# Patient Record
Sex: Female | Born: 1990 | Race: White | Hispanic: No | Marital: Married | Smoking: Never smoker
Health system: Southern US, Community
[De-identification: ages and names within clinical notes are randomized; demographics above are authoritative.]

## PROBLEM LIST (undated history)

## (undated) ENCOUNTER — Inpatient Hospital Stay: Payer: Self-pay

## (undated) DIAGNOSIS — E282 Polycystic ovarian syndrome: Secondary | ICD-10-CM

## (undated) DIAGNOSIS — O24419 Gestational diabetes mellitus in pregnancy, unspecified control: Secondary | ICD-10-CM

## (undated) DIAGNOSIS — Z8041 Family history of malignant neoplasm of ovary: Secondary | ICD-10-CM

## (undated) DIAGNOSIS — G43909 Migraine, unspecified, not intractable, without status migrainosus: Secondary | ICD-10-CM

## (undated) DIAGNOSIS — M25511 Pain in right shoulder: Secondary | ICD-10-CM

## (undated) DIAGNOSIS — O159 Eclampsia, unspecified as to time period: Secondary | ICD-10-CM

## (undated) DIAGNOSIS — M549 Dorsalgia, unspecified: Secondary | ICD-10-CM

## (undated) DIAGNOSIS — F419 Anxiety disorder, unspecified: Secondary | ICD-10-CM

## (undated) DIAGNOSIS — M25512 Pain in left shoulder: Secondary | ICD-10-CM

## (undated) DIAGNOSIS — Z1371 Encounter for nonprocreative screening for genetic disease carrier status: Secondary | ICD-10-CM

## (undated) HISTORY — DX: Family history of malignant neoplasm of ovary: Z80.41

## (undated) HISTORY — DX: Dorsalgia, unspecified: M54.9

## (undated) HISTORY — PX: WISDOM TOOTH EXTRACTION: SHX21

## (undated) HISTORY — DX: Pain in right shoulder: M25.511

## (undated) HISTORY — DX: Pain in left shoulder: M25.512

## (undated) HISTORY — DX: Encounter for nonprocreative screening for genetic disease carrier status: Z13.71

---

## 2010-02-15 ENCOUNTER — Ambulatory Visit
Admission: RE | Admit: 2010-02-15 | Discharge: 2010-02-15 | Payer: Self-pay | Source: Home / Self Care | Attending: Obstetrics & Gynecology | Admitting: Obstetrics & Gynecology

## 2010-06-26 NOTE — Assessment & Plan Note (Signed)
Denise Walker, Denise Walker                 ACCOUNT NO.:  [id number]   MEDICAL RECORD NO.:  [id number]          PATIENT TYPE:  POB   LOCATION:  [facility] at [facility]         FACILITY:  [facility]   PHYSICIAN:  Scheryl Darter, MD       DATE OF BIRTH:  Jun 24, 1990   DATE OF SERVICE:                                  CLINIC NOTE   HISTORY OF PRESENT ILLNESS:  She comes in today due to lower abdominal  pain.  The patient is a 20 year old white female gravida 1, para 0,  abortus 1, last menstrual period was 2-1/2 to 3 months ago when she  stopped taking oral contraceptive pills.  The patient has been seen for  several ER visits due to lower abdominal pain that started about a week  before Christmas.  Right side hurts more than the left, but she also has  pelvic pressure.  The patient began suprapubic pain.  She notes that she  has to urinate frequently and she has urge.  She was seen in [place] at  [facility] on two occasions.  CT scan  showed a small kidney stone and a 24-mm cyst.  This is according to the  patient's report we does not have a copy of that report.  She had  ultrasound last Thursday at the emergency room at [facility] and gallbladder  was normal.  She says that she saw a urologist who told her that the  kidney stone was too small to be causing a problem and that the most  likely reason for her pain was the ovarian cyst.  The patient states  that she has very irregular periods if she is not on oral contraceptive  pills.  She has trouble remembering to take her pills.   PAST MEDICAL HISTORY:  The patient started menstruating at age 59.  She  states that on testing, her liver enzymes were elevated.   SOCIAL HISTORY:  She does drink alcohol but she denies tobacco or drug  use.  She is single.   PAST SURGICAL HISTORY:  Wisdom teeth were removed in September 2011.   FAMILY HISTORY:  Diabetes and cancer in her mother and grandfather.  She  says that mother had some sort of  gynecologic cancer.   ALLERGIES:  SEAFOOD.   CURRENT MEDICATIONS:  Vicodin which she says makes her nauseated.   REVIEW OF SYSTEMS:  She is still having the pain is described.  No  vaginal bleeding.  No hematuria noted except for when she was seen in  the emergency room 3 weeks ago.   PHYSICAL EXAMINATION:  GENERAL:  A mildly anxious weight is 140 pounds,  height is 4 feet 11 inches, blood pressure 93/64, pulse 80.  ABDOMEN:  Soft and nontender.  No mass.  PELVIC:  External genitalia:  Vagina and cervix appeared normal.  She  has some mild suprapubic tenderness.  No cervical motion tenderness.  Firm stool is palpated posterior of the vagina.  No adnexal masses or  tenderness.   IMPRESSION:  Pelvic pain with some urinary symptoms and CT scan that  showed ovarian cyst and kidney stones.   PLAN:  Gave release of records from her ER visits and her urology visit.  Schedule a followup ultrasound of pelvis.  We discussed the possibility  getting back on oral contraceptives or some other kind of hormonal  management of her cycles.  She will consider doing this.  We will see  her back after the ultrasound has been completed.      Scheryl Darter, MD     JA/MEDQ  D:  02/15/2010  T:  02/15/2010  Job:  [id number]

## 2010-08-21 ENCOUNTER — Emergency Department: Payer: Self-pay | Admitting: Emergency Medicine

## 2011-04-14 ENCOUNTER — Emergency Department: Payer: Self-pay | Admitting: *Deleted

## 2012-05-07 ENCOUNTER — Ambulatory Visit: Payer: Self-pay | Admitting: Obstetrics and Gynecology

## 2012-11-06 ENCOUNTER — Emergency Department: Payer: Self-pay | Admitting: Emergency Medicine

## 2012-11-06 LAB — CBC
HGB: 13.6 g/dL (ref 12.0–16.0)
MCH: 30.5 pg (ref 26.0–34.0)
RBC: 4.45 10*6/uL (ref 3.80–5.20)
WBC: 6.8 10*3/uL (ref 3.6–11.0)

## 2012-11-06 LAB — COMPREHENSIVE METABOLIC PANEL
Albumin: 3.9 g/dL (ref 3.4–5.0)
BUN: 9 mg/dL (ref 7–18)
Bilirubin,Total: 0.3 mg/dL (ref 0.2–1.0)
Calcium, Total: 8.8 mg/dL (ref 8.5–10.1)
Co2: 25 mmol/L (ref 21–32)
Creatinine: 0.96 mg/dL (ref 0.60–1.30)
Glucose: 99 mg/dL (ref 65–99)
Osmolality: 282 (ref 275–301)
Potassium: 3.4 mmol/L — ABNORMAL LOW (ref 3.5–5.1)
SGPT (ALT): 28 U/L (ref 12–78)

## 2012-11-06 LAB — LIPASE, BLOOD: Lipase: 144 U/L (ref 73–393)

## 2012-11-06 LAB — HCG, QUANTITATIVE, PREGNANCY: Beta Hcg, Quant.: <1 m[IU]/mL — ABNORMAL LOW

## 2012-12-27 ENCOUNTER — Emergency Department: Payer: Self-pay | Admitting: Emergency Medicine

## 2012-12-28 LAB — CBC
HGB: 12.9 g/dL (ref 12.0–16.0)
MCH: 30.2 pg (ref 26.0–34.0)
MCHC: 33.6 g/dL (ref 32.0–36.0)
MCV: 90 fL (ref 80–100)
Platelet: 287 10*3/uL (ref 150–440)
RDW: 12.7 % (ref 11.5–14.5)
WBC: 10.8 10*3/uL (ref 3.6–11.0)

## 2012-12-28 LAB — COMPREHENSIVE METABOLIC PANEL
Albumin: 3.8 g/dL (ref 3.4–5.0)
Anion Gap: 5 — ABNORMAL LOW (ref 7–16)
BUN: 15 mg/dL (ref 7–18)
Chloride: 106 mmol/L (ref 98–107)
Co2: 27 mmol/L (ref 21–32)
Creatinine: 0.93 mg/dL (ref 0.60–1.30)
Glucose: 124 mg/dL — ABNORMAL HIGH (ref 65–99)
Osmolality: 278 (ref 275–301)
SGPT (ALT): 35 U/L (ref 12–78)

## 2012-12-28 LAB — TROPONIN I: Troponin-I: <0.02 ng/mL

## 2013-06-14 ENCOUNTER — Other Ambulatory Visit: Payer: Self-pay

## 2013-06-14 LAB — HCG, QUANTITATIVE, PREGNANCY: Beta Hcg, Quant.: 23 m[IU]/mL — ABNORMAL HIGH

## 2013-10-11 ENCOUNTER — Emergency Department: Payer: Self-pay | Admitting: Student

## 2013-10-11 LAB — CBC
HCT: 43.1 % (ref 35.0–47.0)
HGB: 13.7 g/dL (ref 12.0–16.0)
MCH: 28.7 pg (ref 26.0–34.0)
MCHC: 31.9 g/dL — ABNORMAL LOW (ref 32.0–36.0)
MCV: 90 fL (ref 80–100)
Platelet: 256 10*3/uL (ref 150–440)
RBC: 4.79 10*6/uL (ref 3.80–5.20)
RDW: 13.3 % (ref 11.5–14.5)
WBC: 7.5 10*3/uL (ref 3.6–11.0)

## 2013-10-11 LAB — URINALYSIS, COMPLETE
BILIRUBIN, UR: NEGATIVE
Bacteria: NONE SEEN
Blood: NEGATIVE
GLUCOSE, UR: NEGATIVE mg/dL (ref 0–75)
Ketone: NEGATIVE
Leukocyte Esterase: NEGATIVE
Nitrite: NEGATIVE
PH: 6 (ref 4.5–8.0)
PROTEIN: NEGATIVE
Specific Gravity: 1.008 (ref 1.003–1.030)

## 2013-10-11 LAB — COMPREHENSIVE METABOLIC PANEL
ALT: 24 U/L
ANION GAP: 8 (ref 7–16)
AST: 15 U/L (ref 15–37)
Albumin: 4.1 g/dL (ref 3.4–5.0)
Alkaline Phosphatase: 80 U/L
BUN: 7 mg/dL (ref 7–18)
Bilirubin,Total: 0.2 mg/dL (ref 0.2–1.0)
CALCIUM: 9 mg/dL (ref 8.5–10.1)
Chloride: 105 mmol/L (ref 98–107)
Co2: 27 mmol/L (ref 21–32)
Creatinine: 0.83 mg/dL (ref 0.60–1.30)
EGFR (African American): >60
EGFR (Non-African Amer.): >60
Glucose: 101 mg/dL — ABNORMAL HIGH (ref 65–99)
Osmolality: 278 (ref 275–301)
Potassium: 3.7 mmol/L (ref 3.5–5.1)
SODIUM: 140 mmol/L (ref 136–145)
Total Protein: 8 g/dL (ref 6.4–8.2)

## 2013-10-12 HISTORY — PX: HERNIA REPAIR: SHX51

## 2013-10-25 ENCOUNTER — Ambulatory Visit: Payer: Self-pay | Admitting: Surgery

## 2013-11-03 ENCOUNTER — Other Ambulatory Visit: Payer: Self-pay | Admitting: Surgery

## 2013-11-03 LAB — URINALYSIS, COMPLETE
BLOOD: NEGATIVE
Bilirubin,UR: NEGATIVE
Glucose,UR: NEGATIVE mg/dL (ref 0–75)
KETONE: NEGATIVE
NITRITE: NEGATIVE
Ph: 6 (ref 4.5–8.0)
Protein: NEGATIVE
RBC,UR: 3 /HPF (ref 0–5)
SPECIFIC GRAVITY: 1.019 (ref 1.003–1.030)
Squamous Epithelial: 6

## 2014-04-21 LAB — OB RESULTS CONSOLE VARICELLA ZOSTER ANTIBODY, IGG: Varicella: IMMUNE

## 2014-04-21 LAB — OB RESULTS CONSOLE RUBELLA ANTIBODY, IGM: Rubella: NON-IMMUNE/NOT IMMUNE

## 2014-04-21 LAB — OB RESULTS CONSOLE HEPATITIS B SURFACE ANTIGEN: HEP B S AG: NEGATIVE

## 2014-04-21 LAB — OB RESULTS CONSOLE RPR: RPR: NONREACTIVE

## 2014-04-21 LAB — OB RESULTS CONSOLE HIV ANTIBODY (ROUTINE TESTING): HIV: NONREACTIVE

## 2014-04-21 LAB — OB RESULTS CONSOLE GC/CHLAMYDIA
CHLAMYDIA, DNA PROBE: NEGATIVE
Gonorrhea: NEGATIVE

## 2014-04-21 LAB — OB RESULTS CONSOLE ABO/RH: RH TYPE: NEGATIVE

## 2014-04-21 LAB — OB RESULTS CONSOLE ANTIBODY SCREEN: Antibody Screen: NEGATIVE

## 2014-05-10 ENCOUNTER — Ambulatory Visit
Admit: 2014-05-10 | Disposition: A | Discharge status: Home / Self Care | Payer: Self-pay | Attending: Obstetrics and Gynecology | Admitting: Obstetrics and Gynecology

## 2014-05-13 ENCOUNTER — Ambulatory Visit
Admit: 2014-05-13 | Disposition: A | Discharge status: Home / Self Care | Payer: Self-pay | Attending: Obstetrics and Gynecology | Admitting: Obstetrics and Gynecology

## 2014-06-04 NOTE — Op Note (Signed)
PATIENT NAME:  Denise Walker, Denise Walker MR#:  [id number] DATE OF BIRTH:  1990/07/05  DATE OF PROCEDURE:  10/25/2013  PREOPERATIVE DIAGNOSIS: Umbilical hernia.   POSTOPERATIVE DIAGNOSIS: Umbilical hernia.   PROCEDURE PERFORMED: Umbilical hernia repair primary.   ANESTHESIA: General.   ESTIMATED BLOOD LOSS: 5 mL.   COMPLICATIONS: None.   SPECIMENS: None.   INDICATION FOR SURGERY: [name] is a pleasant 24 year old female who presents with an umbilical nodule which is causing her pain. It did feel like a fascial defect in the office and therefore she was brought to the Operating Room for umbilical hernia repair.   DETAILS OF PROCEDURE: Informed consent was obtained. [name] was brought to the Operating Room suite. She was induced. Endotracheal tube was placed, general anesthesia was administered. Her abdomen was prepped and draped in a standard surgical fashion. A timeout was then performed correctly identifying the patient, name, operative site, and procedure to be performed. An infraumbilical incision was made and deepened down to fascia. The fascia was incised. The umbilical stalk was encircled with a Kelly clamp. The umbilical stalk was incised. The fascia was then clear. There was a small approximately 5 mm defect in the fascia. This was reduced. The fascial hole was closed with interrupted 0 Ethibond. The umbilicus was then tacked to the repair with an inverted 3-0 Vicryl. The wound was then irrigated. The incision was then closed with interrupted 3-0 Vicryl and a running 4-0 Monocryl subcuticular. Dermabond was then placed over the wound. The patient was then awoken, extubated and brought to the postanesthesia care unit. There were no immediate complications. Needle, sponge, and instrument counts were correct at the end of the procedure.      ____________________________ Si Raiderhristopher A. Ariann Khaimov, MD cal:at D: 10/27/2013 09:04:27 ET T: 10/27/2013 09:19:36 ET JOB#: [id number]  cc: Cristal Deerhristopher  A. Abanoub Hanken, MD, <Dictator> Jarvis NewcomerHRISTOPHER A Shyne Lehrke MD ELECTRONICALLY SIGNED 10/28/2013 10:34

## 2014-06-12 ENCOUNTER — Observation Stay
Admission: EM | Admit: 2014-06-12 | Discharge: 2014-06-12 | Disposition: A | Discharge status: Home / Self Care | Payer: 59 | Attending: Obstetrics & Gynecology | Admitting: Obstetrics & Gynecology

## 2014-06-12 DIAGNOSIS — Z3A35 35 weeks gestation of pregnancy: Secondary | ICD-10-CM | POA: Diagnosis not present

## 2014-06-12 DIAGNOSIS — O368130 Decreased fetal movements, third trimester, not applicable or unspecified: Secondary | ICD-10-CM | POA: Diagnosis present

## 2014-06-12 HISTORY — DX: Gestational diabetes mellitus in pregnancy, unspecified control: O24.419

## 2014-06-12 HISTORY — DX: Polycystic ovarian syndrome: E28.2

## 2014-06-12 LAB — URINALYSIS COMPLETE WITH MICROSCOPIC (ARMC ONLY)
Bilirubin Urine: NEGATIVE
GLUCOSE, UA: NEGATIVE mg/dL
HGB URINE DIPSTICK: NEGATIVE
Ketones, ur: NEGATIVE mg/dL
Leukocytes, UA: NEGATIVE
Nitrite: NEGATIVE
PROTEIN: NEGATIVE mg/dL
SPECIFIC GRAVITY, URINE: 1.016 (ref 1.005–1.030)
pH: 7 (ref 5.0–8.0)

## 2014-06-12 MED ORDER — ACETAMINOPHEN 325 MG PO TABS: 650.0000 mg | ORAL_TABLET | ORAL | Status: DC | PRN

## 2014-06-12 NOTE — H&P (Signed)
Obstetric History and Physical  Denise Walker is a 24 y.o. G2P0010 with IUP at 69w2dpresenting for decreased fetal movement and lower pelvic pain. Patient states she has been having  none contractions, none vaginal bleeding, intact membranes, with decreased  fetal movement.  She reports that she had tried fetal kick counts and did not get 10 movements in 2 hours. She also reports lower pelvic pain that "feels like a bladder infection." She denies fever, chills, nausea, or vomiting.   Prenatal Course Source of Care: [facility]  with onset of care at 8 weeks Pregnancy complications or risks: Patient Active Problem List   Diagnosis Date Noted  . Decreased fetal movement 06/12/2014    Prenatal labs and studies: ABO, Rh: O/Negative/-- (03/10 0000) Antibody: Negative (03/10 0000) Rubella: Nonimmune (03/10 0000) RPR: Nonreactive (03/10 0000)  HBsAg: Negative (03/10 0000)  HIV: Non-reactive (03/10 0000)  GBS: unknown  1 hr Glucola  181 Genetic screening normal Anatomy [place]ormal  Prenatal Transfer Tool  Maternal Diabetes: Yes:  Diabetes Type:  Insulin/Medication controlled Genetic Screening: Normal Maternal Ultrasounds/Referrals: Normal Fetal Ultrasounds or other Referrals:  None Maternal Substance Abuse:  No Significant Maternal Medications:  Meds include: Zoloft Significant Maternal Lab Results: None  Past Medical History  Diagnosis Date  . Gestational diabetes   . PCOS (polycystic ovarian syndrome)     Past Surgical History  Procedure Laterality Date  . Hernia repair  10/2013  . Wisdom tooth extraction      OB History  Gravida Para Term Preterm AB SAB TAB Ectopic Multiple Living  2    1 1         # Outcome Date GA Lbr Len/2nd Weight Sex Delivery Anes PTL Lv  2 Current           1 SAB               History   Social History  . Marital Status: Married    Spouse Name: N/A  . Number of Children: N/A  . Years of Education: N/A   Social History Main Topics  . Smoking  status: Never Smoker   . Smokeless tobacco: Never Used  . Alcohol Use: No  . Drug Use: No  . Sexual Activity: Yes   Other Topics Concern  . None   Social History Narrative  . None    Family History  Problem Relation Age of Onset  . Cancer Mother   . Diabetes Paternal Grandfather     Prescriptions prior to admission  Medication Sig Dispense Refill Last Dose  . ACCU-CHEK AVIVA PLUS test strip   0   . ACCU-CHEK SOFTCLIX LANCETS lancets 4 (four) times daily. for testing  0   . Blood Glucose Monitoring Suppl (ACCU-CHEK AVIVA PLUS) W/DEVICE KIT See admin instructions.  0   . glyBURIDE (DIABETA) 2.5 MG tablet      . sertraline (ZOLOFT) 50 MG tablet Take 50 mg by mouth daily.  0     Allergies  Allergen Reactions  . Fish Allergy Shortness Of Breath    Review of Systems: Negative except for what is mentioned in HPI.  Physical Exam: Temp(Src) 98.4 F (36.9 C) (Oral)  Resp 16  Ht 4' 11"  (1.499 m)  Wt 80.74 kg (178 lb)  BMI 35.93 kg/m2  LMP 10/08/2013 (Exact Date) GENERAL: Well-developed, well-nourished female in no acute distress.  ABDOMEN: Soft, nontender, nondistended, gravid. BACK: negative CVAT EXTREMITIES: Nontender, no edema, 2+ distal pulses. Cervical Exam: Deferred  Presentation: deferred  FHT:  Baseline rate 130 bpm   Variability moderate  Accelerations present   Decelerations none Contractions: Every 0 mins   Pertinent Labs/Studies:   Results for orders placed or performed during the hospital encounter of 06/12/14 (from the past 24 hour(s))  Urinalysis complete, with microscopic [facility])     Status: Abnormal   Collection Time: 06/12/14  3:52 PM  Result Value Ref Range   Color, Urine YELLOW (A) YELLOW   APPearance CLEAR (A) CLEAR   Glucose, UA NEGATIVE NEGATIVE mg/dL   Bilirubin Urine NEGATIVE NEGATIVE   Ketones, ur NEGATIVE NEGATIVE mg/dL   Specific Gravity, Urine 1.016 1.005 - 1.030   Hgb urine dipstick NEGATIVE NEGATIVE   pH 7.0 5.0 - 8.0   Protein, ur  NEGATIVE NEGATIVE mg/dL   Nitrite NEGATIVE NEGATIVE   Leukocytes, UA NEGATIVE NEGATIVE   RBC / HPF 0-5 0 - 5 RBC/hpf   WBC, UA 0-5 0 - 5 WBC/hpf   Bacteria, UA RARE (A) NONE SEEN   Squamous Epithelial / LPF 0-5 (A) NONE SEEN   Mucous PRESENT     Assessment : Denise Walker is a 24 y.o. G2P0010 at 36w2dhere for decreased fetal movement and pelvic pain  Cat 1 FHT  Plan:  UA negative- urine culture to be obtained.  Fetal status reassuring- discussed with pt- readdressed FKCs.  Discharge home. Pt has an appt on Thursday.     CLouisa Second [facility]

## 2014-06-12 NOTE — OB Triage Note (Signed)
24 yo caucasian female with complaint of decreased fetal movement today.  States that she has not noticed the baby moving as much over the past couple of hours.  Denies abdominal pain, bleeding, rupture of membranes.  Denies headaches, heartburn, or blurred vision today.  States that she just wanted to make sure that the baby was doing okay and wanted to be assessed.  Denies any pain.

## 2014-06-22 LAB — OB RESULTS CONSOLE GBS: STREP GROUP B AG: NEGATIVE

## 2014-07-04 ENCOUNTER — Observation Stay
Admission: EM | Admit: 2014-07-04 | Discharge: 2014-07-04 | Disposition: A | Discharge status: Home / Self Care | Payer: 59 | Attending: Obstetrics and Gynecology | Admitting: Obstetrics and Gynecology

## 2014-07-04 DIAGNOSIS — Z3493 Encounter for supervision of normal pregnancy, unspecified, third trimester: Secondary | ICD-10-CM | POA: Diagnosis not present

## 2014-07-04 DIAGNOSIS — Z3A38 38 weeks gestation of pregnancy: Secondary | ICD-10-CM | POA: Insufficient documentation

## 2014-07-04 NOTE — OB Triage Note (Signed)
Obstetric History and Physical  Denise Walker is a 24 y.o. G2P0010 with Estimated Date of Delivery: 07/15/14 who presents at 107w3d presenting for NST after non-reactive NST in office earlier today. Patient states she has been having no contractions, no vaginal bleeding, intact membranes, with active fetal movement.    Prenatal Course Source of Care: [facility] Pregnancy complications or risks: AO1YYQ- on Glyburide Patient Active Problem List   Diagnosis Date Noted  . Non-reactive NST (non-stress test) 07/04/2014  . Decreased fetal movement 06/12/2014    Prenatal labs and studies: ABO, Rh: O neg  Antibody: neg Rubella: non-immune Varicella: immune RPR:  NR HBsAg:  neg HIV: neg GC/CT:  GBS: neg 1 hr Glucola: 3 elevated values on 3 hour glucose   Genetic screening: 1st trimester screen negative    Prenatal Transfer Tool   Past Medical History  Diagnosis Date  . Gestational diabetes   . PCOS (polycystic ovarian syndrome)     Past Surgical History  Procedure Laterality Date  . Hernia repair  10/2013  . Wisdom tooth extraction      OB History  Gravida Para Term Preterm AB SAB TAB Ectopic Multiple Living  _0 # Outcome Date GA Lbr Len/2nd Weight Sex Delivery Anes PTL Lv  2 Current           1 SAB               History   Social History  . Marital Status: Married    Spouse Name: N/A  . Number of Children: N/A  . Years of Education: N/A   Social History Main Topics  . Smoking status: Never Smoker   . Smokeless tobacco: Never Used  . Alcohol Use: No  . Drug Use: No  . Sexual Activity: Yes   Other Topics Concern  . Not on file   Social History Narrative  . No narrative on file    Family History  Problem Relation Age of Onset  . Cancer Mother   . Diabetes Paternal Grandfather     Prescriptions prior to admission  Medication Sig Dispense Refill Last Dose  . ACCU-CHEK AVIVA PLUS test strip   0   . ACCU-CHEK SOFTCLIX LANCETS lancets 4 (four)  times daily. for testing  0   . Blood Glucose Monitoring Suppl (ACCU-CHEK AVIVA PLUS) W/DEVICE KIT See admin instructions.  0   . glyBURIDE (DIABETA) 2.5 MG tablet      . sertraline (ZOLOFT) 50 MG tablet Take 50 mg by mouth daily.  0     Allergies  Allergen Reactions  . Fish Allergy Shortness Of Breath    Review of Systems: Negative except for what is mentioned in HPI.  Physical Exam: LMP 10/08/2013 (Exact Date) GENERAL: Well-developed, well-nourished female in no acute distress.  LUNGS: Clear to auscultation bilaterally.  HEART: Regular rate and rhythm. ABDOMEN: Soft, nontender, nondistended, gravid. EXTREMITIES: Nontender, no edema Cervical Exam: deferred Presentation: cephalic FHT: Category: Baseline rate 135 bpm   Variability moderate  Accelerations present   Decelerations none Contractions: rare   Pertinent Labs/Studies:   No results found for this or any previous visit (from the past 24 hour(s)).  Assessment : IUP at 336w3dith reactive NST  Plan: Discharge Home

## 2014-07-14 ENCOUNTER — Inpatient Hospital Stay
Admission: EM | Admit: 2014-07-14 | Discharge: 2014-07-18 | DRG: 765 | Disposition: A | Discharge status: Home / Self Care | Payer: 59 | Attending: Obstetrics & Gynecology | Admitting: Obstetrics & Gynecology

## 2014-07-14 ENCOUNTER — Encounter: Payer: Self-pay | Admitting: *Deleted

## 2014-07-14 DIAGNOSIS — Z833 Family history of diabetes mellitus: Secondary | ICD-10-CM | POA: Diagnosis not present

## 2014-07-14 DIAGNOSIS — O360930 Maternal care for other rhesus isoimmunization, third trimester, not applicable or unspecified: Secondary | ICD-10-CM | POA: Diagnosis present

## 2014-07-14 DIAGNOSIS — D62 Acute posthemorrhagic anemia: Secondary | ICD-10-CM | POA: Diagnosis not present

## 2014-07-14 DIAGNOSIS — O152 Eclampsia in the puerperium: Secondary | ICD-10-CM | POA: Diagnosis not present

## 2014-07-14 DIAGNOSIS — Z79899 Other long term (current) drug therapy: Secondary | ICD-10-CM

## 2014-07-14 DIAGNOSIS — O24419 Gestational diabetes mellitus in pregnancy, unspecified control: Secondary | ICD-10-CM | POA: Diagnosis present

## 2014-07-14 DIAGNOSIS — O339 Maternal care for disproportion, unspecified: Secondary | ICD-10-CM | POA: Diagnosis present

## 2014-07-14 DIAGNOSIS — O151 Eclampsia in labor: Secondary | ICD-10-CM | POA: Diagnosis not present

## 2014-07-14 DIAGNOSIS — E282 Polycystic ovarian syndrome: Secondary | ICD-10-CM | POA: Diagnosis present

## 2014-07-14 DIAGNOSIS — O9902 Anemia complicating childbirth: Secondary | ICD-10-CM | POA: Diagnosis not present

## 2014-07-14 DIAGNOSIS — O1493 Unspecified pre-eclampsia, third trimester: Secondary | ICD-10-CM | POA: Diagnosis present

## 2014-07-14 DIAGNOSIS — Z3A40 40 weeks gestation of pregnancy: Secondary | ICD-10-CM | POA: Diagnosis present

## 2014-07-14 DIAGNOSIS — O24429 Gestational diabetes mellitus in childbirth, unspecified control: Secondary | ICD-10-CM | POA: Diagnosis present

## 2014-07-14 DIAGNOSIS — O3421 Maternal care for scar from previous cesarean delivery: Secondary | ICD-10-CM | POA: Diagnosis present

## 2014-07-14 LAB — CBC
HEMATOCRIT: 35.9 % (ref 35.0–47.0)
HEMOGLOBIN: 11.7 g/dL — AB (ref 12.0–16.0)
MCH: 28.4 pg (ref 26.0–34.0)
MCHC: 32.5 g/dL (ref 32.0–36.0)
MCV: 87.3 fL (ref 80.0–100.0)
PLATELETS: 141 10*3/uL — AB (ref 150–440)
RBC: 4.12 MIL/uL (ref 3.80–5.20)
RDW: 15.4 % — ABNORMAL HIGH (ref 11.5–14.5)
WBC: 9.5 10*3/uL (ref 3.6–11.0)

## 2014-07-14 LAB — COMPREHENSIVE METABOLIC PANEL
ALT: 33 U/L (ref 14–54)
ANION GAP: 5 (ref 5–15)
AST: 35 U/L (ref 15–41)
Albumin: 2.6 g/dL — ABNORMAL LOW (ref 3.5–5.0)
Alkaline Phosphatase: 179 U/L — ABNORMAL HIGH (ref 38–126)
BUN: 11 mg/dL (ref 6–20)
CO2: 23 mmol/L (ref 22–32)
Calcium: 8.8 mg/dL — ABNORMAL LOW (ref 8.9–10.3)
Chloride: 109 mmol/L (ref 101–111)
Creatinine, Ser: 0.81 mg/dL (ref 0.44–1.00)
GFR calc Af Amer: >60 mL/min (ref >60)
Glucose, Bld: 98 mg/dL (ref 65–99)
Potassium: 3.8 mmol/L (ref 3.5–5.1)
SODIUM: 137 mmol/L (ref 135–145)
Total Bilirubin: 0.4 mg/dL (ref 0.3–1.2)
Total Protein: 5.6 g/dL — ABNORMAL LOW (ref 6.5–8.1)

## 2014-07-14 LAB — GLUCOSE, CAPILLARY
GLUCOSE-CAPILLARY: 83 mg/dL (ref 65–99)
Glucose-Capillary: 91 mg/dL (ref 65–99)
Glucose-Capillary: 102 mg/dL — ABNORMAL HIGH (ref 65–99)

## 2014-07-14 LAB — PROTEIN / CREATININE RATIO, URINE
CREATININE, URINE: 260 mg/dL
PROTEIN CREATININE RATIO: 4.25 mg/mg{creat} — AB (ref 0.00–0.15)
TOTAL PROTEIN, URINE: 1105 mg/dL

## 2014-07-14 LAB — ABO/RH: ABO/RH(D): O NEG

## 2014-07-14 MED ORDER — TRIAZOLAM 0.25 MG PO TABS
ORAL_TABLET | ORAL | Status: AC
  Administered 2014-07-14: 0.25 mg via ORAL
  Filled 2014-07-14: qty 1

## 2014-07-14 MED ORDER — CITRIC ACID-SODIUM CITRATE 334-500 MG/5ML PO SOLN: 30.0000 mL | ORAL | Status: DC | PRN

## 2014-07-14 MED ORDER — BUTORPHANOL TARTRATE 1 MG/ML IJ SOLN
1.0000 mg | INTRAMUSCULAR | Status: DC | PRN
  Administered 2014-07-15 (×2): 1 mg via INTRAVENOUS

## 2014-07-14 MED ORDER — ZOLPIDEM TARTRATE 5 MG PO TABS
ORAL_TABLET | ORAL | Status: AC
  Administered 2014-07-14: 5 mg via ORAL
  Filled 2014-07-14: qty 1

## 2014-07-14 MED ORDER — LACTATED RINGERS IV SOLN
500.0000 mL | INTRAVENOUS | Status: DC | PRN
  Administered 2014-07-15: 500 mL via INTRAVENOUS

## 2014-07-14 MED ORDER — TRIAZOLAM 0.25 MG PO TABS
0.2500 mg | ORAL_TABLET | Freq: Once | ORAL | Status: AC
  Administered 2014-07-14: 0.25 mg via ORAL

## 2014-07-14 MED ORDER — TERBUTALINE SULFATE 1 MG/ML IJ SOLN: 0.2500 mg | Freq: Once | INTRAMUSCULAR | Status: AC | PRN

## 2014-07-14 MED ORDER — DINOPROSTONE 10 MG VA INST
10.0000 mg | VAGINAL_INSERT | Freq: Once | VAGINAL | Status: AC
  Administered 2014-07-14: 10 mg via VAGINAL
  Filled 2014-07-14 (×2): qty 1

## 2014-07-14 MED ORDER — LACTATED RINGERS IV SOLN
INTRAVENOUS | Status: DC
  Administered 2014-07-14 (×2): via INTRAVENOUS
  Administered 2014-07-16: 75 mL/h via INTRAVENOUS

## 2014-07-14 MED ORDER — OXYTOCIN BOLUS FROM INFUSION: 500.0000 mL | INTRAVENOUS | Status: DC

## 2014-07-14 MED ORDER — ACETAMINOPHEN 325 MG PO TABS
650.0000 mg | ORAL_TABLET | ORAL | Status: DC | PRN
  Administered 2014-07-15 (×2): 650 mg via ORAL

## 2014-07-14 MED ORDER — LABETALOL HCL 200 MG PO TABS: 200.0000 mg | ORAL_TABLET | Freq: Two times a day (BID) | ORAL | Status: DC

## 2014-07-14 MED ORDER — MISOPROSTOL 25 MCG QUARTER TABLET
25.0000 ug | ORAL_TABLET | ORAL | Status: DC | PRN
  Administered 2014-07-15 (×2): 25 ug via VAGINAL

## 2014-07-14 MED ORDER — ONDANSETRON HCL 4 MG/2ML IJ SOLN: 4.0000 mg | Freq: Four times a day (QID) | INTRAMUSCULAR | Status: DC | PRN

## 2014-07-14 MED ORDER — OXYTOCIN 40 UNITS IN LACTATED RINGERS INFUSION - SIMPLE MED
62.5000 mL/h | INTRAVENOUS | Status: DC
  Administered 2014-07-15: 1 mL via INTRAVENOUS
  Administered 2014-07-15: 399 mL via INTRAVENOUS

## 2014-07-14 MED ORDER — MISOPROSTOL 25 MCG QUARTER TABLET
ORAL_TABLET | ORAL | Status: AC
  Administered 2014-07-15: 25 ug via VAGINAL
  Filled 2014-07-14: qty 0.25

## 2014-07-14 MED ORDER — ZOLPIDEM TARTRATE 5 MG PO TABS
5.0000 mg | ORAL_TABLET | Freq: Every evening | ORAL | Status: DC | PRN
  Administered 2014-07-14: 5 mg via ORAL

## 2014-07-14 MED ORDER — LIDOCAINE HCL (PF) 1 % IJ SOLN: 30.0000 mL | INTRAMUSCULAR | Status: DC | PRN

## 2014-07-14 NOTE — H&P (Signed)
   Obstetric H&P   Chief Complaint: IOL [place] Prenatal Care Provider: [facility]  History of Present Illness: 24 y.o. G2P0010 14w6dby 07/15/2014, presenting to L&D for IOL for [place] glyburide well controlled.   ABO, Rh: O/Negative/-- (03/10 0000)  Antibody: Negative (03/10 0000)   RPR: Nonreactive (03/10 0000)  HBsAg: Negative (03/10 0000)  HIV: Non-reactive (03/10 0000)  RPR: Nonreactive (03/10 0000) GBS: negative  Review of Systems: 10 point review of systems negative unless otherwise noted in HPI  Past Medical History: Past Medical History  Diagnosis Date  . Gestational diabetes   . PCOS (polycystic ovarian syndrome)     Past Surgical History: Past Surgical History  Procedure Laterality Date  . Hernia repair  10/2013  . Wisdom tooth extraction      Family History: Family History  Problem Relation Age of Onset  . Cancer Mother   . Diabetes Paternal Grandfather     Social History: History   Social History  . Marital Status: Married    Spouse Name: N/A  . Number of Children: N/A  . Years of Education: N/A   Occupational History  . Not on file.   Social History Main Topics  . Smoking status: Never Smoker   . Smokeless tobacco: Never Used  . Alcohol Use: No  . Drug Use: No  . Sexual Activity: Yes   Other Topics Concern  . Not on file   Social History Narrative    Medications: Prior to Admission medications   Medication Sig Start Date End Date Taking? Authorizing Provider  ACCU-CHEK AVIVA PLUS test strip  05/19/14  Yes Historical Provider, MD  ACCU-CHEK SOFTCLIX LANCETS lancets 4 (four) times daily. for testing 04/29/14  Yes Historical Provider, MD  Blood Glucose Monitoring Suppl (ACCU-CHEK AVIVA PLUS) W/DEVICE KIT See admin instructions. 04/29/14  Yes Historical Provider, MD  diphenhydrAMINE (BENADRYL) 25 mg capsule Take 25 mg by mouth every 6 (six) hours as needed.   Yes Historical Provider, MD  glyBURIDE (DIABETA) 2.5 MG tablet  05/20/14  Yes Historical  Provider, MD  Prenatal MV-Min-Fe Fum-FA-DHA (PRENATAL 1 PO) Take 1 tablet by mouth daily.   Yes Historical Provider, MD  sertraline (ZOLOFT) 50 MG tablet Take 50 mg by mouth daily. 05/27/14  Yes Historical Provider, MD    Allergies: Allergies  Allergen Reactions  . Fish Allergy Shortness Of Breath    Physical Exam: Vitals: Blood pressure 155/91, pulse 71, temperature 97.7 F (36.5 C), temperature source Oral, resp. rate 20, height 4' 11" (1.499 m), weight 81.194 kg (179 lb), last menstrual period 10/08/2013.  Urine Dip Protein: pending  FHT: 130, mod, +accels, no decels Toco: none  General: NAD HEENT: normocephalic, anicteric Pulmonary: CTAB Cardiovascular: RRR Abdomen: Gravid,  Soft, non-tender Leopolds: 7lbs Extremities: no edema  Labs: No results found for this or any previous visit (from the past 24 hour(s)).  Assessment: 24y.o. G2P0010 319w6dy 07/15/2014, IOL GDM  Plan: 1) IOL - cervidil  2) Fetus -reactive  3) PNL - O/Negative/-- (03/10 0000) / Negative (03/10 0000) / Nonreactive (03/10 0000) / Negative (03/10 0000) / Nonreactive (03/10 0000)  / Non-reactive (03/10 0000)   4) TDAP - up to date   5) Elevated BP - send PIH panel  6) Disposition - pending delivery

## 2014-07-14 NOTE — Progress Notes (Signed)
Subjective:  Doing well no HA, vision changes, RUQ or epigastric pain Objective:   Vitals: Blood pressure labile up and down, resp. rate 16, height  (1.499 m), weight 81.194 kg (179 lb), last menstrual period 10/08/2013. General: NAD Abdomen: ND, NT, gravid, VTX Cervical Exam: difficult exam due to pt intolerence to exam Dilation: Fingertip Effacement (%): unsure Cervical Position: Posterior Station: unsure Presentation: Vertex  FHT: 140s, mod, + acels, no decels Toco: irregular  Results for orders placed or performed during the hospital encounter of 07/14/14 (from the past 24 hour(s))  CBC     Status: Abnormal   Collection Time: 07/14/14  7:58 AM  Result Value Ref Range   WBC 9.5 3.6 - 11.0 K/uL   RBC 4.12 3.80 - 5.20 MIL/uL   Hemoglobin 11.7 (L) 12.0 - 16.0 g/dL   HCT 16.1 09.6 - 04.5 %   MCV 87.3 80.0 - 100.0 fL   MCH 28.4 26.0 - 34.0 pg   MCHC 32.5 32.0 - 36.0 g/dL   RDW 40.9 (H) 81.1 - 91.4 %   Platelets 141 (L) 150 - 440 K/uL  Comprehensive metabolic panel     Status: Abnormal   Collection Time: 07/14/14  7:58 AM  Result Value Ref Range   Sodium 137 135 - 145 mmol/L   Potassium 3.8 3.5 - 5.1 mmol/L   Chloride 109 101 - 111 mmol/L   CO2 23 22 - 32 mmol/L   Glucose, Bld 98 65 - 99 mg/dL   BUN 11 6 - 20 mg/dL   Creatinine, Ser 7.82 0.44 - 1.00 mg/dL   Calcium 8.8 (L) 8.9 - 10.3 mg/dL   Total Protein 5.6 (L) 6.5 - 8.1 g/dL   Albumin 2.6 (L) 3.5 - 5.0 g/dL   AST 35 15 - 41 U/L   ALT 33 14 - 54 U/L   Alkaline Phosphatase 179 (H) 38 - 126 U/L   Total Bilirubin 0.4 0.3 - 1.2 mg/dL   GFR calc non Af Amer >60 >60 mL/min   GFR calc Af Amer >60 >60 mL/min   Anion gap 5 5 - 15  Protein / creatinine ratio, urine     Status: Abnormal   Collection Time: 07/14/14  7:58 AM  Result Value Ref Range   Creatinine, Urine 260 mg/dL   Total Protein, Urine 1105 mg/dL   Protein Creatinine Ratio 4.25 (H) 0.00 - 0.15 mg/mg[Cre]  Type and screen     Status: None (Preliminary  result)   Collection Time: 07/14/14  8:40 AM  Result Value Ref Range   ABO/RH(D) O NEG    Antibody Screen NEG    Sample Expiration 07/17/2014    Antibody Identification PASSIVELY ACQUIRED ANTI-D   ABO/Rh     Status: None   Collection Time: 07/14/14  8:41 AM  Result Value Ref Range   ABO/RH(D) O NEG   Glucose, capillary     Status: None   Collection Time: 07/14/14 11:47 AM  Result Value Ref Range   Glucose-Capillary 91 65 - 99 mg/dL  Glucose, capillary     Status: Abnormal   Collection Time: 07/14/14  4:22 PM  Result Value Ref Range   Glucose-Capillary 102 (H) 65 - 99 mg/dL    Assessment:   24 y.o. G2P0010 [redacted]w[redacted]d  Preeclampsia without severe features. IOL GDMA2 Plan:   1) Labor - continue cervidl   2) Fetus - cat I tracing  3) Preeclampsia - has had a few severe range BP's followed normotensive BP's.  Will  monitor if consistently severe range consider po labetalol or IV hydralazine and then would also start magnesium sulfate  3) Diet now, finish Cervadil.  Reassess at that time (2200)

## 2014-07-14 NOTE — Progress Notes (Signed)
Subjective:  Doing well no HA, vision changes, RUQ or epigastric pain Objective:   Vitals: Blood pressure labile up and down, resp. rate 16, height 4\' 11"  (1.499 m), weight 81.194 kg (179 lb), last menstrual period 10/08/2013. General: NAD Abdomen: ND, NT, gravid, VTX Cervical Exam: difficult exam due to pt intolerence to exam Dilation: Fingertip Effacement (%): 50%  Cervical Position: Posterior Station: -3 Presentation: Vertex  FHT: 140s, mod, + acels, no decels Toco: irregular    Assessment:   24 y.o. G2P0010 6186w6d  Preeclampsia without severe features. IOL GDMA2 Plan:   1) Labor - change to Cytotec.  2) Fetus - cat I tracing  3) Preeclampsia - has had a few severe range BP's followed normotensive BP's.  Will monitor if consistently severe range consider po labetalol or IV hydralazine and then would also start magnesium sulfate  3) Ambien and Stadol PRN

## 2014-07-14 NOTE — Progress Notes (Signed)
Subjective:  Doing well no HA, vision changes, RUQ or epigastric pain Objective:   Vitals: Blood pressure 177/100, pulse 63, temperature 98.2 F (36.8 C), temperature source Oral, resp. rate 16, height 4\' 11"  (1.499 m), weight 81.194 kg (179 lb), last menstrual period 10/08/2013. General:  Abdomen: Cervical Exam:  Dilation: Fingertip Effacement (%): 80 Cervical Position: Posterior Station: -3 Presentation: Vertex Exam by:: L. Elks, RNC  FHT: 135, mod, + acels, no decels Toco: irregular  Results for orders placed or performed during the hospital encounter of 07/14/14 (from the past 24 hour(s))  CBC     Status: Abnormal   Collection Time: 07/14/14  7:58 AM  Result Value Ref Range   WBC 9.5 3.6 - 11.0 K/uL   RBC 4.12 3.80 - 5.20 MIL/uL   Hemoglobin 11.7 (L) 12.0 - 16.0 g/dL   HCT 16.135.9 09.635.0 - 04.547.0 %   MCV 87.3 80.0 - 100.0 fL   MCH 28.4 26.0 - 34.0 pg   MCHC 32.5 32.0 - 36.0 g/dL   RDW 40.915.4 (H) 81.111.5 - 91.414.5 %   Platelets 141 (L) 150 - 440 K/uL  Comprehensive metabolic panel     Status: Abnormal   Collection Time: 07/14/14  7:58 AM  Result Value Ref Range   Sodium 137 135 - 145 mmol/L   Potassium 3.8 3.5 - 5.1 mmol/L   Chloride 109 101 - 111 mmol/L   CO2 23 22 - 32 mmol/L   Glucose, Bld 98 65 - 99 mg/dL   BUN 11 6 - 20 mg/dL   Creatinine, Ser 7.820.81 0.44 - 1.00 mg/dL   Calcium 8.8 (L) 8.9 - 10.3 mg/dL   Total Protein 5.6 (L) 6.5 - 8.1 g/dL   Albumin 2.6 (L) 3.5 - 5.0 g/dL   AST 35 15 - 41 U/L   ALT 33 14 - 54 U/L   Alkaline Phosphatase 179 (H) 38 - 126 U/L   Total Bilirubin 0.4 0.3 - 1.2 mg/dL   GFR calc non Af Amer >60 >60 mL/min   GFR calc Af Amer >60 >60 mL/min   Anion gap 5 5 - 15  Protein / creatinine ratio, urine     Status: Abnormal   Collection Time: 07/14/14  7:58 AM  Result Value Ref Range   Creatinine, Urine 260 mg/dL   Total Protein, Urine 1105 mg/dL   Protein Creatinine Ratio 4.25 (H) 0.00 - 0.15 mg/mg[Cre]  Type and screen     Status: None (Preliminary  result)   Collection Time: 07/14/14  8:40 AM  Result Value Ref Range   ABO/RH(D) PENDING    Antibody Screen PENDING    Sample Expiration 07/17/2014   ABO/Rh     Status: None   Collection Time: 07/14/14  8:41 AM  Result Value Ref Range   ABO/RH(D) O NEG   Glucose, capillary     Status: None   Collection Time: 07/14/14 11:47 AM  Result Value Ref Range   Glucose-Capillary 91 65 - 99 mg/dL    Assessment:   23 y.o. G2P0010 1545w6d  Preeclampsia without severe features. IOL GDMA2 Plan:   1) Labor - continue cervidl   2) Fetus - cat I tracing  3) Preeclampsia - has had a few severe range BP's followed normotensive BP's.  Will monitor if consistently severe range consider po labetalol or IV hydralazine and then would also start magnesium sulfate

## 2014-07-15 ENCOUNTER — Encounter: Payer: Self-pay | Admitting: Anesthesiology

## 2014-07-15 ENCOUNTER — Encounter
Admission: EM | Disposition: A | Discharge status: Home / Self Care | Payer: Self-pay | Source: Home / Self Care | Attending: Obstetrics & Gynecology

## 2014-07-15 ENCOUNTER — Inpatient Hospital Stay: Payer: 59 | Admitting: Certified Registered"

## 2014-07-15 LAB — COMPREHENSIVE METABOLIC PANEL
ALK PHOS: 167 U/L — AB (ref 38–126)
ALT: 32 U/L (ref 14–54)
ANION GAP: 8 (ref 5–15)
AST: 40 U/L (ref 15–41)
Albumin: 2.3 g/dL — ABNORMAL LOW (ref 3.5–5.0)
BUN: 9 mg/dL (ref 6–20)
CHLORIDE: 110 mmol/L (ref 101–111)
CO2: 24 mmol/L (ref 22–32)
Calcium: 8.2 mg/dL — ABNORMAL LOW (ref 8.9–10.3)
Creatinine, Ser: 1.03 mg/dL — ABNORMAL HIGH (ref 0.44–1.00)
GFR calc non Af Amer: >60 mL/min (ref >60)
GLUCOSE: 112 mg/dL — AB (ref 65–99)
Potassium: 4.4 mmol/L (ref 3.5–5.1)
SODIUM: 142 mmol/L (ref 135–145)
TOTAL PROTEIN: 5.2 g/dL — AB (ref 6.5–8.1)
Total Bilirubin: 0.4 mg/dL (ref 0.3–1.2)

## 2014-07-15 LAB — CBC WITH DIFFERENTIAL/PLATELET
BASOS ABS: 0.3 10*3/uL — AB (ref 0–0.1)
BASOS PCT: 2 %
Eosinophils Absolute: 0.1 10*3/uL (ref 0–0.7)
Eosinophils Relative: 0 %
HEMATOCRIT: 37.3 % (ref 35.0–47.0)
HEMOGLOBIN: 12 g/dL (ref 12.0–16.0)
LYMPHS PCT: 2 %
Lymphs Abs: 0.4 10*3/uL — ABNORMAL LOW (ref 1.0–3.6)
MCH: 28 pg (ref 26.0–34.0)
MCHC: 32.1 g/dL (ref 32.0–36.0)
MCV: 87.4 fL (ref 80.0–100.0)
MONO ABS: 1 10*3/uL — AB (ref 0.2–0.9)
MONOS PCT: 5 %
NEUTROS ABS: 16 10*3/uL — AB (ref 1.4–6.5)
NEUTROS PCT: 91 %
Platelets: 143 10*3/uL — ABNORMAL LOW (ref 150–440)
RBC: 4.27 MIL/uL (ref 3.80–5.20)
RDW: 15.4 % — AB (ref 11.5–14.5)
WBC: 17.7 10*3/uL — AB (ref 3.6–11.0)

## 2014-07-15 LAB — PROTEIN / CREATININE RATIO, URINE
CREATININE, URINE: 171 mg/dL
Protein Creatinine Ratio: 5.34 mg/mg{Cre} — ABNORMAL HIGH (ref 0.00–0.15)
Total Protein, Urine: 913 mg/dL

## 2014-07-15 LAB — PLATELET COUNT: Platelets: 130 10*3/uL — ABNORMAL LOW (ref 150–440)

## 2014-07-15 LAB — GLUCOSE, CAPILLARY
GLUCOSE-CAPILLARY: 86 mg/dL (ref 65–99)
GLUCOSE-CAPILLARY: 104 mg/dL — AB (ref 65–99)
Glucose-Capillary: 100 mg/dL — ABNORMAL HIGH (ref 65–99)
Glucose-Capillary: 105 mg/dL — ABNORMAL HIGH (ref 65–99)
Glucose-Capillary: 94 mg/dL (ref 65–99)

## 2014-07-15 LAB — RPR: RPR Ser Ql: NONREACTIVE

## 2014-07-15 SURGERY — Surgical Case
Anesthesia: Epidural

## 2014-07-15 MED ORDER — METOPROLOL TARTRATE 1 MG/ML IV SOLN
INTRAVENOUS | Status: DC | PRN
  Administered 2014-07-15: 2 mg via INTRAVENOUS

## 2014-07-15 MED ORDER — PHENYLEPHRINE HCL 10 MG/ML IJ SOLN
INTRAMUSCULAR | Status: DC | PRN
  Administered 2014-07-15: 100 ug via INTRAVENOUS

## 2014-07-15 MED ORDER — BUPIVACAINE HCL (PF) 0.5 % IJ SOLN
INTRAMUSCULAR | Status: AC
  Administered 2014-07-15: 21:00:00
  Filled 2014-07-15: qty 30

## 2014-07-15 MED ORDER — OXYTOCIN 40 UNITS IN LACTATED RINGERS INFUSION - SIMPLE MED
INTRAVENOUS | Status: AC
  Administered 2014-07-15: 999 mL/h via INTRAVENOUS
  Filled 2014-07-15: qty 1000

## 2014-07-15 MED ORDER — BUTORPHANOL TARTRATE 1 MG/ML IJ SOLN
INTRAMUSCULAR | Status: AC
  Administered 2014-07-15: 1 mg via INTRAVENOUS
  Filled 2014-07-15: qty 1

## 2014-07-15 MED ORDER — ACETAMINOPHEN 325 MG PO TABS
ORAL_TABLET | ORAL | Status: AC
  Administered 2014-07-15: 650 mg via ORAL
  Filled 2014-07-15: qty 2

## 2014-07-15 MED ORDER — FENTANYL 2.5 MCG/ML W/ROPIVACAINE 0.2% IN NS 100 ML EPIDURAL INFUSION (ARMC-ANES): 9.0000 mL/h | EPIDURAL | Status: DC

## 2014-07-15 MED ORDER — BUTORPHANOL TARTRATE 1 MG/ML IJ SOLN
2.0000 mg | Freq: Once | INTRAMUSCULAR | Status: AC
  Administered 2014-07-15: 2 mg via INTRAVENOUS

## 2014-07-15 MED ORDER — BUPIVACAINE HCL (PF) 0.5 % IJ SOLN
INTRAMUSCULAR | Status: AC
  Filled 2014-07-15: qty 30

## 2014-07-15 MED ORDER — MAGNESIUM SULFATE BOLUS VIA INFUSION
4.0000 g | Freq: Once | INTRAVENOUS | Status: AC
  Administered 2014-07-16: 4 g via INTRAVENOUS
  Filled 2014-07-15: qty 500

## 2014-07-15 MED ORDER — TERBUTALINE SULFATE 1 MG/ML IJ SOLN: 0.2500 mg | Freq: Once | INTRAMUSCULAR | Status: AC | PRN

## 2014-07-15 MED ORDER — CITRIC ACID-SODIUM CITRATE 334-500 MG/5ML PO SOLN
ORAL | Status: AC
  Administered 2014-07-15: 30 mL via ORAL
  Filled 2014-07-15: qty 15

## 2014-07-15 MED ORDER — 0.9 % SODIUM CHLORIDE (POUR BTL) OPTIME
TOPICAL | Status: DC | PRN
  Administered 2014-07-15: 1000 mL

## 2014-07-15 MED ORDER — CEFOXITIN SODIUM-DEXTROSE 2-2.2 GM-% IV SOLR (PREMIX)
INTRAVENOUS | Status: AC
  Administered 2014-07-15: 2 g via INTRAVENOUS
  Filled 2014-07-15: qty 50

## 2014-07-15 MED ORDER — BUPIVACAINE HCL 0.5 % IJ SOLN
10.0000 mL | Freq: Once | INTRAMUSCULAR | Status: AC
  Administered 2014-07-15: 10 mL
  Filled 2014-07-15: qty 10

## 2014-07-15 MED ORDER — LACTATED RINGERS IV SOLN
INTRAVENOUS | Status: DC
  Administered 2014-07-15: 20:00:00 via INTRAVENOUS

## 2014-07-15 MED ORDER — HYDRALAZINE HCL 20 MG/ML IJ SOLN: 10.0000 mg | Freq: Once | INTRAMUSCULAR | Status: AC | PRN

## 2014-07-15 MED ORDER — PHENYLEPHRINE HCL 10 MG/ML IJ SOLN: INTRAMUSCULAR | Status: DC | PRN

## 2014-07-15 MED ORDER — FENTANYL 2.5 MCG/ML W/ROPIVACAINE 0.2% IN NS 100 ML EPIDURAL INFUSION (ARMC-ANES)
EPIDURAL | Status: AC
  Administered 2014-07-15: 9 mL/h via EPIDURAL
  Filled 2014-07-15: qty 100

## 2014-07-15 MED ORDER — BUPIVACAINE 0.25 % ON-Q PUMP DUAL CATH 400 ML
INJECTION | Status: AC
  Filled 2014-07-15: qty 400

## 2014-07-15 MED ORDER — EPHEDRINE 5 MG/ML INJ: 10.0000 mg | INTRAVENOUS | Status: DC | PRN

## 2014-07-15 MED ORDER — CITRIC ACID-SODIUM CITRATE 334-500 MG/5ML PO SOLN
30.0000 mL | Freq: Once | ORAL | Status: AC
  Administered 2014-07-15 (×2): 30 mL via ORAL

## 2014-07-15 MED ORDER — BUPIVACAINE HCL (PF) 0.75 % IJ SOLN
INTRAMUSCULAR | Status: DC | PRN
  Administered 2014-07-15: 1.6 mL

## 2014-07-15 MED ORDER — MAGNESIUM SULFATE 4 GM/100ML IV SOLN
INTRAVENOUS | Status: AC
  Administered 2014-07-16: 01:00:00
  Filled 2014-07-15: qty 100

## 2014-07-15 MED ORDER — OXYTOCIN 40 UNITS IN LACTATED RINGERS INFUSION - SIMPLE MED
INTRAVENOUS | Status: AC
  Filled 2014-07-15: qty 1000

## 2014-07-15 MED ORDER — BUTORPHANOL TARTRATE 1 MG/ML IJ SOLN
INTRAMUSCULAR | Status: AC
  Filled 2014-07-15: qty 2

## 2014-07-15 MED ORDER — BUPIVACAINE HCL (PF) 0.5 % IJ SOLN
INTRAMUSCULAR | Status: DC | PRN
  Administered 2014-07-15: 10 mL

## 2014-07-15 MED ORDER — MAGNESIUM SULFATE 50 % IJ SOLN
2.0000 g/h | INTRAVENOUS | Status: DC
  Administered 2014-07-16: 2 g/h via INTRAVENOUS
  Filled 2014-07-15 (×2): qty 80

## 2014-07-15 MED ORDER — CEFOXITIN SODIUM-DEXTROSE 2-2.2 GM-% IV SOLR (PREMIX): 2.0000 g | INTRAVENOUS | Status: DC

## 2014-07-15 MED ORDER — MISOPROSTOL 25 MCG QUARTER TABLET
ORAL_TABLET | ORAL | Status: AC
  Administered 2014-07-15: 25 ug via VAGINAL
  Filled 2014-07-15: qty 0.25

## 2014-07-15 MED ORDER — MIDAZOLAM HCL 5 MG/ML IJ SOLN
INTRAMUSCULAR | Status: DC | PRN
  Administered 2014-07-15: 1 mg via INTRAVENOUS

## 2014-07-15 MED ORDER — OXYTOCIN 40 UNITS IN LACTATED RINGERS INFUSION - SIMPLE MED
1.0000 m[IU]/min | INTRAVENOUS | Status: DC
Start: 2014-07-15 — End: 2014-07-16
  Administered 2014-07-15 (×2): 1 m[IU]/min via INTRAVENOUS

## 2014-07-15 MED ORDER — PHENYLEPHRINE 40 MCG/ML (10ML) SYRINGE FOR IV PUSH (FOR BLOOD PRESSURE SUPPORT): 80.0000 ug | PREFILLED_SYRINGE | INTRAVENOUS | Status: DC | PRN

## 2014-07-15 MED ORDER — BUPIVACAINE 0.25 % ON-Q PUMP DUAL CATH 400 ML: 400.0000 mL | INJECTION | Status: DC

## 2014-07-15 MED ORDER — DIPHENHYDRAMINE HCL 50 MG/ML IJ SOLN
12.5000 mg | INTRAMUSCULAR | Status: DC | PRN
  Administered 2014-07-15: 25 mg via INTRAVENOUS

## 2014-07-15 MED ORDER — BUPIVACAINE HCL (PF) 0.25 % IJ SOLN
INTRAMUSCULAR | Status: DC | PRN
  Administered 2014-07-15: 5 mL

## 2014-07-15 MED ORDER — LIDOCAINE HCL (PF) 2 % IJ SOLN
INTRAMUSCULAR | Status: DC | PRN
  Administered 2014-07-15: 7 mL via INTRADERMAL

## 2014-07-15 SURGICAL SUPPLY — 24 items
CANISTER SUCT 3000ML (MISCELLANEOUS) ×2 IMPLANT
CATH KIT ON-Q SILVERSOAK 5IN (CATHETERS) ×4 IMPLANT
CHLORAPREP W/TINT 26ML (MISCELLANEOUS) ×4 IMPLANT
DRSG TEGADERM 4X4.75 (GAUZE/BANDAGES/DRESSINGS) ×2 IMPLANT
ELECT CAUTERY BLADE 6.4 (BLADE) ×2 IMPLANT
GLOVE SKINSENSE NS SZ8.0 LF (GLOVE) ×1
GLOVE SKINSENSE STRL SZ8.0 LF (GLOVE) ×1 IMPLANT
GOWN STRL REUS W/ TWL LRG LVL3 (GOWN DISPOSABLE) ×1 IMPLANT
GOWN STRL REUS W/ TWL XL LVL3 (GOWN DISPOSABLE) ×2 IMPLANT
GOWN STRL REUS W/TWL LRG LVL3 (GOWN DISPOSABLE) ×1
GOWN STRL REUS W/TWL XL LVL3 (GOWN DISPOSABLE) ×2
LIQUID BAND (GAUZE/BANDAGES/DRESSINGS) ×2 IMPLANT
NS IRRIG 1000ML POUR BTL (IV SOLUTION) ×2 IMPLANT
PACK C SECTION AR (MISCELLANEOUS) ×2 IMPLANT
PAD GROUND ADULT SPLIT (MISCELLANEOUS) ×2 IMPLANT
PAD OB MATERNITY 4.3X12.25 (PERSONAL CARE ITEMS) ×2 IMPLANT
PAD PREP 24X41 OB/GYN DISP (PERSONAL CARE ITEMS) IMPLANT
SPONGE LAP 18X18 5 PK (GAUZE/BANDAGES/DRESSINGS) ×2 IMPLANT
SUT MAXON ABS #0 GS21 30IN (SUTURE) ×4 IMPLANT
SUT VIC AB 1 CT1 36 (SUTURE) ×2 IMPLANT
SUT VIC AB 2-0 CT1 36 (SUTURE) ×2 IMPLANT
SUT VIC AB 2-0 SH 27 (SUTURE) ×1
SUT VIC AB 2-0 SH 27XBRD (SUTURE) ×1 IMPLANT
SUT VIC AB 4-0 FS2 27 (SUTURE) ×2 IMPLANT

## 2014-07-15 NOTE — Transfer of Care (Signed)
  Immediate Anesthesia Transfer of Care Note  Patient: Denise LandauKelley Walker  Procedure(s) Performed: * No procedures listed *  Patient Location: LD 6  Anesthesia Type:Spinal  Level of Consciousness: awake, alert  and oriented  Airway & Oxygen Therapy: Patient Spontanous Breathing and Patient connected to face mask oxygen  Post-op Assessment: Report given to RN and Post -op Vital signs reviewed and stable  Post vital signs: Reviewed and stable  Last Vitals:  Filed Vitals:   07/15/14 2140  BP: 104/80  Pulse: 87  Temp: 36.9 C  Resp:     Complications: No apparent anesthesia complications

## 2014-07-15 NOTE — Progress Notes (Signed)
Abd clipped and wiped with Chlor.  Foley emptied.  Pt transferred via bed to OR in stable condition.

## 2014-07-15 NOTE — Plan of Care (Signed)
Report to next shift RN staff 

## 2014-07-15 NOTE — Progress Notes (Signed)
   Subjective:  Pt comfortable with epidural   Objective: BP 164/97 mmHg  Pulse 67  Temp(Src) 98.1 F (36.7 C) (Oral)  Resp 18  Ht 4\' 11"  (1.499 m)  Wt 81.194 kg (179 lb)  BMI 36.13 kg/m2  LMP 10/08/2013 (Exact Date)   Total I/O In: 925 [I.V.:925] Out: -   FHT:  FHR: 110-120s bpm, variability: moderate,  accelerations:  Present,  decelerations:  Present occassional decelerations when contractions triple UC:   irregular, every 1-6 minutes SVE:   Dilation: 4 Effacement (%): 90 Station: -1 Exam by:: CMS, CNM    IUPC placed. Pt on pitocin currently.   Labs: Lab Results  Component Value Date   WBC 9.5 07/14/2014   HGB 11.7* 07/14/2014   HCT 35.9 07/14/2014   MCV 87.3 07/14/2014   PLT 130* 07/15/2014    Assessment / Plan: IOL for GDMA2  Labor: on pitocin, will continue induction. Can assess for adequate contraction strength with IUPC.  Fetal Wellbeing:  Category II . Dr. Tiburcio PeaHarris previously at the bedside to review strip and discuss possibility of cesarean section for FITL if fetus does not tolerate labor.  Pain Control:  Epidural Anticipated MOD:  NSVD  Jannet MantisSubudhi,  Zayna Toste 07/15/2014, 4:19 PM

## 2014-07-15 NOTE — Progress Notes (Signed)
Subjective:  Doing well no HA, vision changes, RUQ or epigastric pain. Pitocin.  Epidural.     Objective:  Vitals: Blood pressure labile up and down, height 4\' 11"  (1.499 m), weight 81.194 kg (179 lb) General: NAD  Abdomen: gravid, VTX Cervical Exam: Dilation: 6 Effacement (%): 80%  Station: -1 Presentation: Vertex  FHT: 120s, mod, + accels, no decels Toco: every 2 -4 min    Assessment:   24 y.o. G2P0010 40w  Preeclampsia without severe features. IOL GDMA2 Plan:   1) Labor - Pitocin.  IUPC w adequate mVu.  2) Fetus - cat I tracing  3) Preeclampsia -   Will monitor; if consistently severe range consider po labetalol or IV hydralazine and then would also start magnesium sulfate  3) Epidural.

## 2014-07-15 NOTE — Progress Notes (Signed)
Pt received from OR via bed in stable condition.  SCD's applied.  IV on pump

## 2014-07-15 NOTE — Anesthesia Procedure Notes (Addendum)
Epidural Patient location during procedure: OB Start time: 07/15/2014 11:50 AM End time: 07/15/2014 12:25 PM  Staffing Anesthesiologist: Gunnar Fusi Resident/CRNA: Rolla Plate Performed by: resident/CRNA   Epidural Patient position: sitting Prep: ChloraPrep and site prepped and draped Patient monitoring: heart rate, cardiac monitor, continuous pulse ox and blood pressure Approach: midline Location: L3-L4 Injection technique: LOR saline  Needle:  Needle type: Tuohy  Needle gauge: 18 G Needle length: 9 cm Needle insertion depth: 7 cm Catheter type: closed end flexible Catheter size: 20 Guage Catheter at skin depth: 12 cm Test dose: negative and 1.5% lidocaine with Epi 1:200 K  Assessment Events: blood not aspirated, injection not painful, no injection resistance, negative IV test and no paresthesia  Additional Notes Reason for block:procedure for pain  Spinal Patient location during procedure: OR Start time: 07/15/2014 8:34 PM End time: 07/15/2014 8:37 PM Staffing Anesthesiologist: Alvin Critchley Resident/CRNA: Doreen Salvage Performed by: resident/CRNA  Preanesthetic Checklist Completed: patient identified, site marked, surgical consent, pre-op evaluation, timeout performed, IV checked, risks and benefits discussed and monitors and equipment checked Spinal Block Patient position: sitting Prep: ChloraPrep Patient monitoring: heart rate, continuous pulse ox, blood pressure and cardiac monitor Approach: midline Location: L3-4 Injection technique: single-shot Needle Needle type: Whitacre and Introducer  Needle gauge: 25 G Needle length: 9 cm Assessment Sensory level: T4 Additional Notes Negative paresthesia. Negative blood return. Positive free-flowing CSF. Expiration date of kit checked and confirmed. Patient tolerated procedure well, without complications.

## 2014-07-15 NOTE — Anesthesia Preprocedure Evaluation (Addendum)
Anesthesia Evaluation  Patient identified by MRN, date of birth, ID band Patient awake    Reviewed: Allergy & Precautions, NPO status , Patient's Chart, lab work & pertinent test results  Airway Mallampati: III  TM Distance: >3 FB Neck ROM: Full    Dental  (+) Teeth Intact   Pulmonary asthma ,          Cardiovascular hypertension,     Neuro/Psych  Headaches, Seizures -, Well Controlled,  Anxiety Depression    GI/Hepatic Neg liver ROS, GERD-  ,  Endo/Other  diabetes  Renal/GU negative Renal ROS  negative genitourinary   Musculoskeletal negative musculoskeletal ROS (+)   Abdominal   Peds  Hematology negative hematology ROS (+)   Anesthesia Other Findings   Reproductive/Obstetrics (+) Pregnancy                           Anesthesia Physical Anesthesia Plan  ASA: II  Anesthesia Plan: Epidural   Post-op Pain Management:    Induction:   Airway Management Planned:   Additional Equipment:   Intra-op Plan:   Post-operative Plan:   Informed Consent: I have reviewed the patients History and Physical, chart, labs and discussed the procedure including the risks, benefits and alternatives for the proposed anesthesia with the patient or authorized representative who has indicated his/her understanding and acceptance.     Plan Discussed with:   Anesthesia Plan Comments:         Anesthesia Quick Evaluation

## 2014-07-15 NOTE — Progress Notes (Signed)
Subjective:  Doing well no HA, vision changes, RUQ or epigastric pain. Painful ctxs.  SROM around 0800. Stadol last dose 0525, again now. Cytotec last dose 0515.  Objective:   Vitals: Blood pressure labile up and down, height 4\' 11"  (1.499 m), weight 81.194 kg (179 lb) General: NAD although visibly in pain Abdomen: gravid, VTX Cervical Exam: difficult exam due to pt intolerence to exam Dilation: 1-2 Effacement (%): 50%  Cervical Position: Posterior Station: -3 Presentation: Vertex  FHT: 140s, mod, + acels, no decels Toco: every 2 -4 min    Assessment:   24 y.o. G2P0010 40w  Preeclampsia without severe features. IOL GDMA2 Plan:   1) Labor - change to Pitocin although contracting well now.  2) Fetus - cat I tracing  3) Preeclampsia -   Will monitor; if consistently severe range consider po labetalol or IV hydralazine and then would also start magnesium sulfate  3) Stadol PRN.  Epidural when cervix changes.

## 2014-07-15 NOTE — Op Note (Signed)
Cesarean Section Procedure Note Indications: cephalo-pelvic disproportion, failed induction, failure to progress: arrest of dilation, prior cesarean section and term intrauterine pregnancy  Pre-operative Diagnosis: Intrauterine pregnancy [id number] ;  cephalo-pelvic disproportion, failed induction, failure to progress: arrest of dilation, prior cesarean section and term intrauterine pregnancy Post-operative Diagnosis: same, delivered. Procedure: Low Transverse Cesarean Section Surgeon: Annamarie MajorPaul Dakin Madani, MD, FACOG Assistant(s): Channing MuttersLori Brame Anesthesia: Spinal anesthesia Estimated Blood Loss:250 mL Complications: None; patient tolerated the procedure well. Disposition: PACU - hemodynamically stable. Condition: stable  Findings: A female infant in the cephalic presentation. Amniotic fluid - Clear  Birth weight 6-5 g.  Apgars of 9 and 9.  Intact placenta with a three-vessel cord. Nuchal x2. Grossly normal uterus, tubes and ovaries bilaterally. no intraabdominal adhesions were noted.  Procedure Details   The patient was taken to Operating Room, identified as the correct patient and the procedure verified as C-Section Delivery. A Time Out was held and the above information confirmed. After induction of anesthesia, the patient was draped and prepped in the usual sterile manner. A Pfannenstiel incision was made and carried down through the subcutaneous tissue to the fascia. Fascial incision was made and extended transversely with the Mayo scissors. The fascia was separated from the underlying rectus tissue superiorly and inferiorly. The peritoneum was identified and entered bluntly. Peritoneal incision was extended longitudinally. The utero-vesical peritoneal reflection was incised transversely and a bladder flap was created digitally.  A low transverse hysterotomy was made. The fetus was delivered atraumatically. The umbilical cord was clamped x2 and cut and the infant was handed to the awaiting pediatricians.  The placenta was removed intact and appeared normal with a 3-vessel cord.  The uterus was exteriorized and cleared of all clot and debris. The hysterotomy was closed with running sutures of 0 Vicryl suture. A second imbricating layer was placed with the same suture. Excellent hemostasis was observed. The uterus was returned to the abdomen. The pelvis was irrigated and again, excellent hemostasis was noted.  The On Q Pain pump System was then placed.  Trocars were placed through the abdominal wall into the subfascial space and these were used to thread the silver soaker cathaters into place.The rectus fascia was then reapproximated with running sutures of Maxon, with careful placement not to incorporate the cathaters. Subcutaneous tissues are then irrigated with saline and hemostasis assured.  Skin is then closed with 4-0 vicryl suture in a subcuticular fashion followed by skin adhesive. The cathaters are flushed each with 5 mL of Bupivicaine and stabilized into place with dressing. Instrument, sponge, and needle counts were correct prior to the abdominal closure and at the conclusion of the case.  The patient tolerated the procedure well and was transferred to the recovery room in stable condition.

## 2014-07-15 NOTE — Progress Notes (Signed)
Subjective:  No HA, vision changes, RUQ or epigastric pain. Pitocin.  Epidural.  No change in cervix.     Objective:  Vitals: Blood pressure labile up and down, height 4\' 11"  (1.499 m), weight 81.194 kg (179 lb) General: NAD  Abdomen: gravid, VTX Cervical Exam: Dilation: 6 Effacement (%): 80%  Station: -1 Presentation: Vertex  FHT: 120s, mod, + accels, occas decels Toco: every 2 -4 min    Assessment:   24 y.o. G2P0010 40w  Preeclampsia without severe features. IOL GDMA2 Plan:   1) Labor - FTP, CPD.  2) Fetus - cat I tracing  3) Preeclampsia -   Will monitor; if consistently severe range consider po labetalol or IV hydralazine and then would also start magnesium sulfate  3) Epidural.  4) CS  The risks of cesarean section discussed with the patient included but were not limited to: bleeding which may require transfusion or reoperation; infection which may require antibiotics; injury to bowel, bladder, ureters or other surrounding organs; injury to the fetus; need for additional procedures including hysterectomy in the event of a life-threatening hemorrhage; placental abnormalities wth subsequent pregnancies, incisional problems, thromboembolic phenomenon and other postoperative/anesthesia complications. The patient concurred with the proposed plan, giving informed written consent for the procedure.

## 2014-07-16 LAB — URIC ACID: Uric Acid, Serum: 6 mg/dL (ref 2.3–6.6)

## 2014-07-16 LAB — CBC
HCT: 33.3 % — ABNORMAL LOW (ref 35.0–47.0)
Hemoglobin: 10.7 g/dL — ABNORMAL LOW (ref 12.0–16.0)
MCH: 28.1 pg (ref 26.0–34.0)
MCHC: 32.2 g/dL (ref 32.0–36.0)
MCV: 87.1 fL (ref 80.0–100.0)
Platelets: 123 10*3/uL — ABNORMAL LOW (ref 150–440)
RBC: 3.82 MIL/uL (ref 3.80–5.20)
RDW: 15.4 % — ABNORMAL HIGH (ref 11.5–14.5)
WBC: 15.2 10*3/uL — AB (ref 3.6–11.0)

## 2014-07-16 LAB — FETAL SCREEN: Fetal Screen: NEGATIVE

## 2014-07-16 LAB — CREATININE, SERUM
Creatinine, Ser: 0.83 mg/dL (ref 0.44–1.00)
GFR calc Af Amer: >60 mL/min (ref >60)
GFR calc non Af Amer: >60 mL/min (ref >60)

## 2014-07-16 LAB — PROTEIN / CREATININE RATIO, URINE
CREATININE, URINE: 55 mg/dL
Protein Creatinine Ratio: 1.16 mg/mg{Cre} — ABNORMAL HIGH (ref 0.00–0.15)
Total Protein, Urine: 64 mg/dL

## 2014-07-16 LAB — MAGNESIUM: Magnesium: 4.4 mg/dL — ABNORMAL HIGH (ref 1.7–2.4)

## 2014-07-16 MED ORDER — LABETALOL HCL 200 MG PO TABS
200.0000 mg | ORAL_TABLET | Freq: Two times a day (BID) | ORAL | Status: DC
  Administered 2014-07-16 – 2014-07-18 (×5): 200 mg via ORAL
  Filled 2014-07-16 (×2): qty 1

## 2014-07-16 MED ORDER — MEPERIDINE HCL 25 MG/ML IJ SOLN: 25.0000 mg | INTRAMUSCULAR | Status: DC | PRN

## 2014-07-16 MED ORDER — PRENATAL MULTIVITAMIN CH
1.0000 | ORAL_TABLET | Freq: Every day | ORAL | Status: DC
  Administered 2014-07-16 – 2014-07-18 (×3): 1 via ORAL
  Filled 2014-07-16 (×3): qty 1

## 2014-07-16 MED ORDER — OXYCODONE-ACETAMINOPHEN 5-325 MG PO TABS
2.0000 | ORAL_TABLET | ORAL | Status: DC | PRN
  Administered 2014-07-16: 1 via ORAL

## 2014-07-16 MED ORDER — SENNOSIDES-DOCUSATE SODIUM 8.6-50 MG PO TABS
2.0000 | ORAL_TABLET | ORAL | Status: DC
  Administered 2014-07-16 – 2014-07-18 (×4): 2 via ORAL
  Filled 2014-07-16 (×5): qty 2

## 2014-07-16 MED ORDER — WITCH HAZEL-GLYCERIN EX PADS: 1.0000 "application " | MEDICATED_PAD | CUTANEOUS | Status: DC | PRN

## 2014-07-16 MED ORDER — LIDOCAINE VISCOUS 2 % MT SOLN
10.0000 mL | OROMUCOSAL | Status: DC | PRN
  Administered 2014-07-16 – 2014-07-17 (×4): 10 mL via OROMUCOSAL
  Filled 2014-07-16 (×2): qty 10
  Filled 2014-07-16 (×4): qty 15

## 2014-07-16 MED ORDER — LANOLIN HYDROUS EX OINT: 1.0000 "application " | TOPICAL_OINTMENT | CUTANEOUS | Status: DC | PRN

## 2014-07-16 MED ORDER — SIMETHICONE 80 MG PO CHEW
80.0000 mg | CHEWABLE_TABLET | ORAL | Status: DC
  Filled 2014-07-16: qty 1

## 2014-07-16 MED ORDER — LABETALOL HCL 5 MG/ML IV SOLN
INTRAVENOUS | Status: AC
  Administered 2014-07-16: 20 mg via INTRAVENOUS
  Filled 2014-07-16: qty 4

## 2014-07-16 MED ORDER — LABETALOL HCL 200 MG PO TABS
ORAL_TABLET | ORAL | Status: AC
  Administered 2014-07-16: 200 mg via ORAL
  Filled 2014-07-16: qty 1

## 2014-07-16 MED ORDER — MENTHOL 3 MG MT LOZG
1.0000 | LOZENGE | OROMUCOSAL | Status: DC | PRN
  Filled 2014-07-16: qty 9

## 2014-07-16 MED ORDER — LABETALOL HCL 5 MG/ML IV SOLN: 10.0000 mg | INTRAVENOUS | Status: DC | PRN

## 2014-07-16 MED ORDER — PRENATAL PLUS 27-1 MG PO TABS
ORAL_TABLET | ORAL | Status: AC
  Administered 2014-07-16: 1 via ORAL
  Filled 2014-07-16: qty 1

## 2014-07-16 MED ORDER — OXYCODONE-ACETAMINOPHEN 5-325 MG PO TABS
ORAL_TABLET | ORAL | Status: AC
  Administered 2014-07-16: 1 via ORAL
  Filled 2014-07-16: qty 1

## 2014-07-16 MED ORDER — SIMETHICONE 80 MG PO CHEW
80.0000 mg | CHEWABLE_TABLET | ORAL | Status: DC | PRN
  Administered 2014-07-16: 80 mg via ORAL
  Filled 2014-07-16 (×3): qty 1

## 2014-07-16 MED ORDER — LACTATED RINGERS IV SOLN
INTRAVENOUS | Status: DC
  Administered 2014-07-16: 100 mL/h via INTRAVENOUS
  Administered 2014-07-16: 22:00:00 via INTRAVENOUS

## 2014-07-16 MED ORDER — IBUPROFEN 600 MG PO TABS
ORAL_TABLET | ORAL | Status: AC
  Administered 2014-07-16: 600 mg via ORAL
  Filled 2014-07-16: qty 1

## 2014-07-16 MED ORDER — FUROSEMIDE 10 MG/ML IJ SOLN
20.0000 mg | Freq: Once | INTRAMUSCULAR | Status: AC
  Administered 2014-07-16: 20 mg via INTRAVENOUS
  Filled 2014-07-16: qty 2

## 2014-07-16 MED ORDER — OXYTOCIN 40 UNITS IN LACTATED RINGERS INFUSION - SIMPLE MED: 62.5000 mL/h | INTRAVENOUS | Status: AC

## 2014-07-16 MED ORDER — DIPHENHYDRAMINE HCL 25 MG PO CAPS: 25.0000 mg | ORAL_CAPSULE | Freq: Four times a day (QID) | ORAL | Status: DC | PRN

## 2014-07-16 MED ORDER — RHO D IMMUNE GLOBULIN 1500 UNIT/2ML IJ SOSY
300.0000 ug | PREFILLED_SYRINGE | Freq: Once | INTRAMUSCULAR | Status: AC
  Administered 2014-07-16: 300 ug via INTRAMUSCULAR
  Filled 2014-07-16: qty 2

## 2014-07-16 MED ORDER — MEPERIDINE HCL 25 MG/ML IJ SOLN
INTRAMUSCULAR | Status: AC
  Administered 2014-07-16: 25 mg
  Filled 2014-07-16: qty 1

## 2014-07-16 MED ORDER — LACTATED RINGERS IV SOLN: INTRAVENOUS | Status: DC

## 2014-07-16 MED ORDER — DIBUCAINE 1 % RE OINT: 1.0000 "application " | TOPICAL_OINTMENT | RECTAL | Status: DC | PRN

## 2014-07-16 MED ORDER — SIMETHICONE 80 MG PO CHEW
80.0000 mg | CHEWABLE_TABLET | Freq: Three times a day (TID) | ORAL | Status: DC
  Administered 2014-07-16 – 2014-07-18 (×4): 80 mg via ORAL
  Filled 2014-07-16 (×5): qty 1

## 2014-07-16 MED ORDER — ONDANSETRON HCL 4 MG/2ML IJ SOLN: 4.0000 mg | Freq: Once | INTRAMUSCULAR | Status: AC | PRN

## 2014-07-16 MED ORDER — LABETALOL HCL 200 MG PO TABS
ORAL_TABLET | ORAL | Status: AC
Start: 2014-07-16 — End: 2014-07-16
  Administered 2014-07-16: 200 mg via ORAL
  Filled 2014-07-16: qty 1

## 2014-07-16 MED ORDER — ZOLPIDEM TARTRATE 5 MG PO TABS: 5.0000 mg | ORAL_TABLET | Freq: Every evening | ORAL | Status: DC | PRN

## 2014-07-16 MED ORDER — IBUPROFEN 600 MG PO TABS
600.0000 mg | ORAL_TABLET | Freq: Four times a day (QID) | ORAL | Status: DC
  Administered 2014-07-16 – 2014-07-18 (×9): 600 mg via ORAL
  Filled 2014-07-16 (×4): qty 1

## 2014-07-16 MED ORDER — TETANUS-DIPHTH-ACELL PERTUSSIS 5-2.5-18.5 LF-MCG/0.5 IM SUSP
0.5000 mL | Freq: Once | INTRAMUSCULAR | Status: DC
  Filled 2014-07-16: qty 0.5

## 2014-07-16 MED ORDER — OXYCODONE-ACETAMINOPHEN 5-325 MG PO TABS
1.0000 | ORAL_TABLET | ORAL | Status: DC | PRN
  Administered 2014-07-16 – 2014-07-17 (×5): 1 via ORAL
  Filled 2014-07-16 (×2): qty 1

## 2014-07-16 MED ORDER — SODIUM CHLORIDE 0.9 % IJ SOLN
INTRAMUSCULAR | Status: AC
Start: 2014-07-16 — End: 2014-07-16
  Administered 2014-07-16: 3 mL
  Filled 2014-07-16: qty 12

## 2014-07-16 MED ORDER — ACETAMINOPHEN 325 MG PO TABS: 650.0000 mg | ORAL_TABLET | ORAL | Status: DC | PRN

## 2014-07-16 MED ORDER — LABETALOL HCL 5 MG/ML IV SOLN
20.0000 mg | INTRAVENOUS | Status: DC | PRN
Start: 2014-07-16 — End: 2014-07-18
  Administered 2014-07-16: 20 mg via INTRAVENOUS

## 2014-07-16 MED ORDER — FENTANYL CITRATE (PF) 100 MCG/2ML IJ SOLN: 25.0000 ug | INTRAMUSCULAR | Status: DC | PRN

## 2014-07-16 MED ORDER — SODIUM CHLORIDE 0.9 % IV BOLUS (SEPSIS)
500.0000 mL | Freq: Once | INTRAVENOUS | Status: AC
Start: 2014-07-16 — End: 2014-07-16
  Administered 2014-07-16: 500 mL via INTRAVENOUS

## 2014-07-16 NOTE — Anesthesia Post-op Follow-up Note (Signed)
  Anesthesia Pain Follow-up Note  Patient: Denise LandauKelley Atha  Day #: 1  Date of Follow-up: 07/16/2014 Time: 9:19 AM  Last Vitals:  Filed Vitals:   07/16/14 0859  BP: 142/94  Pulse: 104  Temp:   Resp:     Level of Consciousness: alert  Pain: mild   Side Effects:None  Catheter Site Exam:clean, dry  Plan: D/C from anesthesia care  Orenthal Debski,  Oleh Geninarrie M

## 2014-07-16 NOTE — Progress Notes (Addendum)
POD #1 C-section for FTP. Eclampsia S: Still has frontal headache this Am-but much milder Hungry. NO Nausea, CP. Has sore tongue from where she bit herself during seizure O: Awake, alert, oriented BP 159/104 UO 75-205/hr. Continues on magnesium sulfate Labs this AM: Hct 33.3%. Mag level=4.4. Plt=123K Heart: RRR without murmur Lungs: CTA ABD: Incision C+D+I Bloody drainage around ON Q catheters Lochia-small to moderate SCDs on A: Stable pod #1 Eclampsia: beginning to diurese, blood pressures in the mildly elevated range RH negative P: After discussion with Dr Elesa MassedWard will start on labetalol 200 mgm BID po Advance diet as tolerated Change dresssing around On Q site Continue to monitor blood pressures and UO closely Continue magnesium sulfate at least for 24 hours post delivery Rhogam workup

## 2014-07-16 NOTE — Progress Notes (Signed)
Report given to Dr. Tiburcio PeaHarris.  Orders received.

## 2014-07-16 NOTE — Progress Notes (Signed)
Pt has had seizure.    Post op pain well controlled initially after surgery, then pain slowly worsening    With this pain, BPs trended upwards    Then she had a seizure as witnessed by RN; 3 minutes, mild post ictal disorientation; headache   H/o one seizure as child. Pt had seizure like 15-20 sec activity in OR after spinal prior to CS, no post ictal or other evidence for seizure then  Labs now show urine Prot/Cr ratio >5,000 Labs: WBC/Hgb/Hct/Plts:  17.7/12.0/37.3/143 (06/03 2241) BUN/Cr/glu/ALT/AST/amyl/lip:  9/1.03/--/32/40/--/-- (06/03 2241)   Na/K/Cl/CO2:  142/4.4/110/24 (06/03 2241)    BP now 130s/80s, P120s, R 16 A&O now.  NAD. Abd ND, mild T, inc c/d/i, some bleeding around OnQ pump cathaters Extr 1+ edema, 2+ DTRs  A/P: Eclampsia Magnesium Sulfate 4 g bolus has helped; will maintain on 2g/hour now.  Ativan and Valium options available but not needed.  Rapid response team called and present at time seizure occurred; no further seizure activity.  O2 normal.  Consider antihypertensives.

## 2014-07-16 NOTE — Progress Notes (Signed)
Admit Date: 07/14/2014 Today's Date: 07/16/2014  Subjective: Postpartum Day 1/2: Cesarean Delivery Patient reports feeling better.  No h/a now.  No seizures.   Objective: Vital signs in last 24 hours: Temp:  [98.1 F (36.7 C)-98.6 F (37 C)] 98.4 F (36.9 C) (06/04 0359) Pulse Rate:  [58-268] 91 (06/04 0500) Resp:  [18-20] 18 (06/04 0500) BP: (104-191)/(63-127) 159/99 mmHg (06/04 0500) SpO2:  [97 %-99 %] 99 % (06/04 0400) Weight:  [81.194 kg (179 lb)] 81.194 kg (179 lb) (06/03 0802)  Physical Exam:  General: alert, cooperative and appears stated age Lochia: appropriate Uterine Fundus: firm Incision: healing well DVT Evaluation: No evidence of DVT seen on physical exam.  2+DTRs   Recent Labs  07/14/14 0758 07/15/14 2241  HGB 11.7* 12.0  HCT 35.9 37.3   UOP 200 mL last one hour  Assessment/Plan: Status post Cesarean section. Doing well postoperatively.  Continue current care. Eclampsia- cont Mag.  Level check due to incr BP and hyperreflexia.   Labetalol for BP rise this am >160. Counseled pt and spouse as to seizures and eval and follow up and future pregnancy.  Morgyn Marut PAUL 07/16/2014, 6:25 AM

## 2014-07-16 NOTE — Progress Notes (Addendum)
S: Feeling better, still a little fuzzy headed on the magnesium sulfate, but alert, oriented and answering questions appropriately. Breastfeeding baby O: CTSP due to decreased urine output over the last two hours:   5 ml and 17 ml Urine appears dark yellow Repeat creatinine this AM was 0.83 BP has responded well to labetalol and is now 115/75 Discussed with Dr Elesa MassedWard and orders received A: Decreased urine output-normal creatinine P: 500 ml NS bolus Lasix 20 mgm IV Monitor UO closely  Esha Fincher, CNM

## 2014-07-16 NOTE — Progress Notes (Signed)
Pt's VS alarm was going off.  Upon entering room pt was found to be seizing. Blood and muscous noted running down left check.   Called for help and rapid response.  MD called by staff.   O2 by non rebreather mask on.  4 gram mag bolus started immediately. HOB lowered. 5 lead applied by RRT.   NS rhythm noted.    Pt seized for approximately 3 minutes.  Coming around slowly.  Postictally  extreme sleepiness.

## 2014-07-16 NOTE — Progress Notes (Addendum)
S: Feeling well. Passing flatus. Working with nurses on breastfeeding. Tolerating regular diet O: .vs: BP range after labetalol this Am have ranged from 108-135/67-86. Received Rhogam earlier UO increased after fluid bolus and IV Lasix: 110-350/hour until 1800 when UO dropped to 2220ml/hr, the increased again to 40, 45 and 4150ml/ hr BS active ON Q dressing intact and dry since dressing change earlier Heart: RRR without murmur Lungs: CTA DTRs +2 A: Normotensive since labetalol started UO variable P: Discussed POM with Dr Elesa MassedWard: will continue patient on Magnesium due toi decreased UO Monitor UO, BP closely.  Continue postoperative and postpartum care-support breastfeeding Repeat CMP in Am Rajohn Henery, CNM

## 2014-07-16 NOTE — Anesthesia Postprocedure Evaluation (Signed)
  Anesthesia Post-op Note  Patient: Denise Walker  Procedure(s) Performed: Procedure(s): CESAREAN SECTION (N/A)  Anesthesia type:Epidural  Patient location: LDR6  Post pain: Pain level controlled  Post assessment: Post-op Vital signs reviewed, Patient's Cardiovascular Status Stable, Respiratory Function Stable, Patent Airway and No signs of Nausea or vomiting  Post vital signs: Reviewed and stable  Last Vitals:  Filed Vitals:   07/16/14 0859  BP: 142/94  Pulse: 104  Temp:   Resp:     Level of consciousness: awake, alert  and patient cooperative  Complications: No apparent anesthesia complications

## 2014-07-17 LAB — COMPREHENSIVE METABOLIC PANEL
ALT: 31 U/L (ref 14–54)
AST: 33 U/L (ref 15–41)
Albumin: 1.9 g/dL — ABNORMAL LOW (ref 3.5–5.0)
Alkaline Phosphatase: 114 U/L (ref 38–126)
Anion gap: 5 (ref 5–15)
BUN: 8 mg/dL (ref 6–20)
CHLORIDE: 110 mmol/L (ref 101–111)
CO2: 26 mmol/L (ref 22–32)
CREATININE: 0.86 mg/dL (ref 0.44–1.00)
Calcium: 6.6 mg/dL — ABNORMAL LOW (ref 8.9–10.3)
GFR calc Af Amer: >60 mL/min (ref >60)
GFR calc non Af Amer: >60 mL/min (ref >60)
GLUCOSE: 68 mg/dL (ref 65–99)
POTASSIUM: 3.4 mmol/L — AB (ref 3.5–5.1)
SODIUM: 141 mmol/L (ref 135–145)
Total Bilirubin: 0.4 mg/dL (ref 0.3–1.2)
Total Protein: 4.6 g/dL — ABNORMAL LOW (ref 6.5–8.1)

## 2014-07-17 LAB — RHOGAM INJECTION: Unit division: 0

## 2014-07-17 MED ORDER — OXYCODONE-ACETAMINOPHEN 5-325 MG PO TABS
ORAL_TABLET | ORAL | Status: AC
  Administered 2014-07-17: 1 via ORAL
  Filled 2014-07-17: qty 1

## 2014-07-17 MED ORDER — DIPHENHYDRAMINE HCL 25 MG PO CAPS
ORAL_CAPSULE | ORAL | Status: AC
  Administered 2014-07-17: 25 mg
  Filled 2014-07-17: qty 1

## 2014-07-17 MED ORDER — LABETALOL HCL 200 MG PO TABS
ORAL_TABLET | ORAL | Status: AC
  Administered 2014-07-17: 200 mg via ORAL
  Filled 2014-07-17: qty 1

## 2014-07-17 MED ORDER — IBUPROFEN 600 MG PO TABS
ORAL_TABLET | ORAL | Status: AC
  Administered 2014-07-17: 600 mg via ORAL
  Filled 2014-07-17: qty 1

## 2014-07-17 NOTE — Progress Notes (Signed)
Subjective:   Doing well.  No complaints.  Voiding, tolerating light PO diet, tolerating pain with PO meds. Denies: CP SOB F/C, N/V, calf pain // no HA RUQ/epigastric pain, no change in vision  Would like to get up and walk around.  Breastfeeding daughter.    Objective:  Blood pressure 136/93, pulse 102, temperature 98.2 F (36.8 C), temperature source Oral, resp. rate 20, SpO2 96 %, breastfeeding.  General: NAD CV: RRR Pulmonary: no increased work of breathing, lungs clear bilaterally Abdomen: non-distended, non-tender, fundus firm at level of umbilicus Incision: healing well, no erythema, induration, discharge.  On-q pump intact  Extremities: no edema, no erythema, no tenderness, DTRs 2+   Results for orders placed or performed during the hospital encounter of 07/14/14 (from the past 24 hour(s))  Comprehensive metabolic panel     Status: Abnormal   Collection Time: 07/17/14  8:04 AM  Result Value Ref Range   Sodium 141 135 - 145 mmol/L   Potassium 3.4 (L) 3.5 - 5.1 mmol/L   Chloride 110 101 - 111 mmol/L   CO2 26 22 - 32 mmol/L   Glucose, Bld 68 65 - 99 mg/dL   BUN 8 6 - 20 mg/dL   Creatinine, Ser 1.61 0.44 - 1.00 mg/dL   Calcium 6.6 (L) 8.9 - 10.3 mg/dL   Total Protein 4.6 (L) 6.5 - 8.1 g/dL   Albumin 1.9 (L) 3.5 - 5.0 g/dL   AST 33 15 - 41 U/L   ALT 31 14 - 54 U/L   Alkaline Phosphatase 114 38 - 126 U/L   Total Bilirubin 0.4 0.3 - 1.2 mg/dL   GFR calc non Af Amer >60 >60 mL/min   GFR calc Af Amer >60 >60 mL/min   Anion gap 5 5 - 15    Date 07/17/14 0701 - 07/18/14 0700  Shift 0701-1900 1901-0700 24 Hour Total  I N T A K E  P.O. 450  450     P.O. 450  450   I.V. (mL/kg) 75 (0.9)  75 (0.9)     Volume (mL) Magnesium  75  75   Shift Total (mL/kg) 525 (6.5)  525 (6.5)  O U T P U T  Urine (mL/kg/hr) 400  400     Urine 400  400   Shift Total (mL/kg) 400 (4.9)  400 (4.9)  NET 125  125  Weight (kg) 81.2 81.2 81.2   UOP >100cc/hr each hour since  2300.  Results for orders placed or performed during the hospital encounter of 07/14/14 (from the past 24 hour(s))  Comprehensive metabolic panel     Status: Abnormal   Collection Time: 07/17/14  8:04 AM  Result Value Ref Range   Sodium 141 135 - 145 mmol/L   Potassium 3.4 (L) 3.5 - 5.1 mmol/L   Chloride 110 101 - 111 mmol/L   CO2 26 22 - 32 mmol/L   Glucose, Bld 68 65 - 99 mg/dL   BUN 8 6 - 20 mg/dL   Creatinine, Ser 0.96 0.44 - 1.00 mg/dL   Calcium 6.6 (L) 8.9 - 10.3 mg/dL   Total Protein 4.6 (L) 6.5 - 8.1 g/dL   Albumin 1.9 (L) 3.5 - 5.0 g/dL   AST 33 15 - 41 U/L   ALT 31 14 - 54 U/L   Alkaline Phosphatase 114 38 - 126 U/L   Total Bilirubin 0.4 0.3 - 1.2 mg/dL   GFR calc non Af Amer >60 >60 mL/min   GFR calc Af  Amer >60 >60 mL/min   Anion gap 5 5 - 15     Assessment:   24 y.o. G2P1011 postoperativeday # 2 with eclampsia    Plan:  1) Acute blood loss anemia - hemodynamically stable and asymptomatic - po ferrous sulfate  2) Oneg, RNI, VI, GBS neg, needs RhoGam post delivery  3) TDAP done  4) Breast/Contraception TBD  5) No signs/symptoms of worsening eclampsia, no s/sx mag toxicity.  Ok to D/C magnesium sulfate now, continue Labetalol BID, keep saline locked.  6) GDMA2 - needs 2hr @ 6weeks post partum visit  7) Disposition: stable to be transferred to floor   Ranae Plumberhelsea Toneshia Coello, MD Attending Obstetrician and Gynecologist [facility]

## 2014-07-17 NOTE — Progress Notes (Signed)
Foley emptied.   Peripad changed.  Scant lochia noted.  Incision without redness or drainage.    Patient denies pain at present time.   Returns to sleep.

## 2014-07-18 ENCOUNTER — Encounter: Payer: Self-pay | Admitting: Obstetrics & Gynecology

## 2014-07-18 MED ORDER — OXYCODONE-ACETAMINOPHEN 5-325 MG PO TABS: 1.0000 | ORAL_TABLET | ORAL | Status: DC | PRN

## 2014-07-18 MED ORDER — LABETALOL HCL 200 MG PO TABS: 200.0000 mg | ORAL_TABLET | Freq: Two times a day (BID) | ORAL | Status: DC

## 2014-07-18 MED ORDER — IBUPROFEN 600 MG PO TABS: 600.0000 mg | ORAL_TABLET | Freq: Four times a day (QID) | ORAL | Status: DC

## 2014-07-18 NOTE — Anesthesia Post-op Follow-up Note (Signed)
  Anesthesia Pain Follow-up Note  Patient: Denise Walker  Day #: 3  Date of Follow-up: 07/18/2014 Time: 7:56 AM  Last Vitals:  Filed Vitals:   07/18/14 0733  BP: 129/70  Pulse: 101  Temp: 36.9 C  Resp: 20    Level of Consciousness: alert  Pain: none   Side Effects:None  Catheter Site Exam:clean, dry, no drainage  Plan: D/C from anesthesia care  Basilio CairoHarvey,  Mirriam Vadala E

## 2014-07-18 NOTE — Progress Notes (Signed)
D/C order from MD.  Reviewed d/c instructions and prescriptions with patient and answered any questions.  Patient d/c home with infant via wheelchair by nursing/auxillary. 

## 2014-07-18 NOTE — Addendum Note (Signed)
Addendum  created 07/18/14 0757 by Sherron Flemingsustin Adonnis Salceda, CRNA   Modules edited: Anesthesia Events, Narrator, Notes Section   Narrator:  Narrator: Event Log Edited   Notes Section:  File: [id number]

## 2014-07-18 NOTE — Discharge Summary (Signed)
  Obstetric Discharge Summary  Gestational Age at Delivery: [redacted]w[redacted]d  Antepartum complications: gestational diabetes and HTN Date of Delivery: 07/15/14  Delivered By: Denise Applebaum, MD Delivery Type: primary cesarean section, low transverse incision Intrapartum complications/course: Failure to Progress and Gestational Diabetes, medicine controlled, Hypertension Anesthesia: spinal Placenta: manual removal Laceration: none Episiotomy: none Baby: Liveborn female, Apgars 9/9, weight 6-5lbs.  Postpartum Procedures: Magnesium Sulfate for PP eclamptic seizure Complications-Intrapartum or Postpartum: eclampsia HEMOGLOBIN  Date Value Ref Range Status  07/16/2014 10.7* 12.0 - 16.0 g/dL Final   HGB  Date Value Ref Range Status  10/11/2013 13.7 12.0-16.0 g/dL Final   HCT  Date Value Ref Range Status  07/16/2014 33.3* 35.0 - 47.0 % Final  10/11/2013 43.1 35.0-47.0 % Final     Physical Exam:  General: alert Lochia: appropriate Uterine Fundus: firm Incision: healing well, no significant drainage DVT Evaluation: No evidence of DVT seen on physical exam. Abdomen: abdomen is soft without significant tenderness, masses, organomegaly or guarding  Rubella: Non-immune - MMR given Varicella: Immune TDAP: Up to date Contraception: undecided  Discharge Diagnoses: eclampsia, delivered; GHTN  Discharge Information: Date: 07/18/2014 Activity: pelvic rest Diet: routine Medications: PNV, Ibuprofen, Percocet and zoloft, labetalol Condition: stable Instructions:  Discharge instructions:   Call office if you have any of the following: headache, visual changes, fever >100 F, chills, breast concerns, excessive vaginal bleeding, incision drainage or problems, leg pain or redness, depression or any other concerns.   Activity: Do not lift > 10 lbs for 6 weeks.  No intercourse or tampons for 6 weeks.  No driving for 1-2 weeks.   Discharge to: home Follow-up Information    Follow up with [facility] .      Follow up with Denise Nettles, MD. Go on 07/20/2014.   Specialty:  Obstetrics and Gynecology   Why:  For wound re-check & BP check on 07/20/14 at 1:30 pm   Contact information:   [place] [address] [phone]       Newborn Data: Live born female  Birth Weight: 6 lb 2.1 oz (2780 g) APGAR: 9, 9  Home with mother.  Denise Walker, [place] 07/18/2014, 11:50 AM

## 2014-07-18 NOTE — Discharge Instructions (Signed)
Cesarean Delivery  Cesarean delivery is the birth of a baby through a cut (incision) in the abdomen and womb (uterus).  LET YOUR HEALTH CARE PROVIDER KNOW ABOUT:  All medicines you are taking, including vitamins, herbs, eye drops, creams, and over-the-counter medicines.  Previous problems you or members of your family have had with the use of anesthetics.  Any blood disorders you have.  Previous surgeries you have had.  Medical conditions you have.  Any allergies you have.  Complicationsinvolving the pregnancy. RISKS AND COMPLICATIONS  Generally, this is a safe procedure. However, as with any procedure, complications can occur. Possible complications include:  Bleeding.  Infection.  Blood clots.  Injury to surrounding organs.  Problems with anesthesia.  Injury to the baby. BEFORE THE PROCEDURE   You may be given an antacid medicine to drink. This will prevent acid contents in your stomach from going into your lungs if you vomit during the surgery.  You may be given an antibiotic medicine to prevent infection. PROCEDURE   Hair may be removed from your pubic area and your lower abdomen. This is to prevent infection in the incision site.  A tube (Foley catheter) will be placed in your bladder to drain your urine from your bladder into a bag. This keeps your bladder empty during surgery.  An IV tube will be placed in your vein.  You may be given medicine to numb the lower half of your body (regional anesthetic). If you were in labor, you may have already had an epidural in place which can be used in both labor and cesarean delivery. You may possibly be given medicine to make you sleep (general anesthetic) though this is not as common.  An incision will be made in your abdomen that extends to your uterus. There are 2 basic kinds of incisions:  The horizontal (transverse) incision. Horizontal incisions are from side to side and are used for most routine cesarean  deliveries.  The vertical incision. The vertical incision is from the top of the abdomen to the bottom and is less commonly used. It is often done for women who have a serious complication (extreme prematurity) or under emergency situations.  The horizontal and vertical incisions may both be used at the same time. However, this is very uncommon.  An incision is then made in your uterus to deliver the baby.  Your baby will then be delivered.  Both incisions are then closed with absorbable stitches. AFTER THE PROCEDURE   If you were awake during the surgery, you will see your baby right away. If you were asleep, you will see your baby as soon as you are awake.  You may breastfeed your baby after surgery.  You may be able to get up and walk the same day as the surgery. If you need to stay in bed for a period of time, you will receive help to turn, cough, and take deep breaths after surgery. This helps prevent lung problems such as pneumonia.  Do not get out of bed alone the first time after surgery. You will need help getting out of bed until you are able to do this by yourself.  You may be able to shower the day after your cesarean delivery. After the bandage (dressing) is taken off the incision site, a nurse will assist you to shower if you would like help.  You will have pneumatic compression hose placed on your lower legs. This is done to prevent blood clots. When you are up   and walking regularly, they will no longer be necessary.  Do not cross your legs when you sit.  Save any blood clots that you pass. If you pass a clot while on the toilet, do not flush it. Call for the nurse. Tell the nurse if you think you are bleeding too much or passing too many clots.  You will be given medicine as needed. Let your health care providers know if you are hurting. You may also be given an antibiotic to prevent an infection.  Your IV tube will be taken out when you are drinking a reasonable  amount of fluids. The Foley catheter is taken out when you are up and walking.  If your blood type is Rh negative and your baby's blood type is Rh positive, you will be given a shot of anti-D immune globulin. This shot prevents you from having Rh problems with a future pregnancy. You should get the shot even if you had your tubes tied (tubal ligation).  If you are allowed to take the baby for a walk, place the baby in the bassinet and push it. Do not carry your baby in your arms. Document Released: 01/28/2005 Document Revised: 11/18/2012 Document Reviewed: 08/19/2012 [facility] Patient Information 2015 [place] [place]. This information is not intended to replace advice given to you by your health care provider. Make sure you discuss any questions you have with your health care provider.  Discharge instructions:   Call office if you have any of the following: headache, visual changes, fever >100 F, chills, breast concerns, excessive vaginal bleeding, incision drainage or problems, leg pain or redness, depression or any other concerns.   Activity: Do not lift > 10 lbs for 6 weeks.  No intercourse or tampons for 6 weeks.  No driving for 1-2 weeks.

## 2014-07-20 ENCOUNTER — Ambulatory Visit: Payer: 59 | Attending: Obstetrics and Gynecology

## 2014-07-20 ENCOUNTER — Ambulatory Visit: Payer: 59 | Admitting: Lactation Services

## 2014-07-20 DIAGNOSIS — Z029 Encounter for administrative examinations, unspecified: Secondary | ICD-10-CM | POA: Insufficient documentation

## 2014-07-20 DIAGNOSIS — R633 Feeding difficulties, unspecified: Secondary | ICD-10-CM

## 2014-07-20 NOTE — Progress Notes (Signed)
D- Mom came in for c/o poor milk supply and wanting baby to go back to the breast. Denise Walker is a 695 day old baby born [birth year], full  term at 566'5". Weight on DC = 5'8". Today at MD office is 6'1".  Mother had risks of GDM; HTN which developed into grand mal seizures after delivery, which resolved with treatment of HTN. First feeding delayed many hours, and then difficulties with latch and breastfeeding followed. Mother also has milk supply risks of PCOS; infertility (needing hormone tx to get pregnant). She does however repost having significant breast changes during pregnancy. She has never felt her breasts get full. The last 2 days, Mom has only pumped once at night with Medela Pump In Style and obtained <1oz from both sides. She has spent most feeds trying to get baby to breast, but has needed to bottle feed 1-1/2-2 oz Similac advanced formula every feeding. Baby never latches and screams at breast. She did pump 15 minutes every 3 hours or so the first couple days, but did not say why she stopped the last 2-3 days. A-  Mom has large breasts; intact nipples; no leaking during visit despite compression and feed attempts. Baby was last fed approx 2 1/2 hours ago. Started out sleeping then screaming when she woke and placed near breast. We applied SNS with 20 mm nipple shield (Mom had already been using that since first feeds and already been getting bottles). Baby has a slightly tight, but thin and elastic frenulum, allowing tongue to extend past lips and curve around finger, bottle, shield. We need to have her calm down with sucking on finger, then moving her to breast in FB hold. After about 5 th try, she settled down into rhytmic suck swallow pattern. Dad set up and cleaned sns correctly after demo.  R-   Mom said she felt strong tugs, but no pain. Mom able to apply nipple shield herself correctly and turn SNS on and off. She nursed well for approx. 20 minutes getting 25 ml when she got fussy form dirty diaper.  She was changed and screamed so much that the reminder of the feeding was given by slow flow bottle by Mom.She took 2 oz altogether. Mom's nipple intact.   Mom states that was the best feeding they ever had. PLAN- Mom to pump/hand express at least 8/24 hours; Try sns at breast 1-2 times a day as we did today for at least a few days. Consider increasing feedings with sns at breast only after successful with 1-2. ; Lots of skin to skin time and even bottle feeds near breast (make breast a pleasant place for baby).; rest well between feeds; Denise Walker will continue to slow flow bottle feed when not sns until supply and breastfeeds improve. Follow up app't next Thurs at 1430 here.  Mom sent home with 20 mm nipple shield: starter sns; liquid Similac (better than powder for sns); diary sheets. May consider [place] if MD agreeable (need to check against her other meds and medical hx).

## 2014-07-20 NOTE — Patient Instructions (Signed)
See previous note under PLAN

## 2014-08-10 LAB — TYPE AND SCREEN
ABO/RH(D): O NEG
Antibody Screen: POSITIVE

## 2014-12-01 DIAGNOSIS — F419 Anxiety disorder, unspecified: Secondary | ICD-10-CM | POA: Insufficient documentation

## 2014-12-01 DIAGNOSIS — F411 Generalized anxiety disorder: Secondary | ICD-10-CM | POA: Insufficient documentation

## 2015-08-31 DIAGNOSIS — F339 Major depressive disorder, recurrent, unspecified: Secondary | ICD-10-CM | POA: Diagnosis not present

## 2015-08-31 DIAGNOSIS — O99345 Other mental disorders complicating the puerperium: Secondary | ICD-10-CM | POA: Diagnosis not present

## 2015-09-06 DIAGNOSIS — E282 Polycystic ovarian syndrome: Secondary | ICD-10-CM | POA: Diagnosis not present

## 2015-09-07 DIAGNOSIS — F339 Major depressive disorder, recurrent, unspecified: Secondary | ICD-10-CM | POA: Diagnosis not present

## 2015-09-07 DIAGNOSIS — O99345 Other mental disorders complicating the puerperium: Secondary | ICD-10-CM | POA: Diagnosis not present

## 2015-09-18 DIAGNOSIS — F339 Major depressive disorder, recurrent, unspecified: Secondary | ICD-10-CM | POA: Diagnosis not present

## 2015-09-18 DIAGNOSIS — O99345 Other mental disorders complicating the puerperium: Secondary | ICD-10-CM | POA: Diagnosis not present

## 2015-09-20 DIAGNOSIS — Z30432 Encounter for removal of intrauterine contraceptive device: Secondary | ICD-10-CM | POA: Diagnosis not present

## 2015-09-23 DIAGNOSIS — H60502 Unspecified acute noninfective otitis externa, left ear: Secondary | ICD-10-CM | POA: Diagnosis not present

## 2015-09-23 DIAGNOSIS — H6692 Otitis media, unspecified, left ear: Secondary | ICD-10-CM | POA: Diagnosis not present

## 2015-09-23 DIAGNOSIS — J069 Acute upper respiratory infection, unspecified: Secondary | ICD-10-CM | POA: Diagnosis not present

## 2015-09-28 DIAGNOSIS — Z6833 Body mass index (BMI) 33.0-33.9, adult: Secondary | ICD-10-CM | POA: Diagnosis not present

## 2015-09-28 DIAGNOSIS — F333 Major depressive disorder, recurrent, severe with psychotic symptoms: Secondary | ICD-10-CM | POA: Diagnosis not present

## 2015-09-28 DIAGNOSIS — F419 Anxiety disorder, unspecified: Secondary | ICD-10-CM | POA: Diagnosis not present

## 2015-09-29 DIAGNOSIS — E282 Polycystic ovarian syndrome: Secondary | ICD-10-CM | POA: Diagnosis not present

## 2015-10-12 DIAGNOSIS — F419 Anxiety disorder, unspecified: Secondary | ICD-10-CM | POA: Diagnosis not present

## 2015-10-19 DIAGNOSIS — F419 Anxiety disorder, unspecified: Secondary | ICD-10-CM | POA: Diagnosis not present

## 2015-10-27 DIAGNOSIS — E282 Polycystic ovarian syndrome: Secondary | ICD-10-CM | POA: Diagnosis not present

## 2015-11-02 DIAGNOSIS — F419 Anxiety disorder, unspecified: Secondary | ICD-10-CM | POA: Diagnosis not present

## 2015-11-07 DIAGNOSIS — B373 Candidiasis of vulva and vagina: Secondary | ICD-10-CM | POA: Diagnosis not present

## 2015-11-16 DIAGNOSIS — F419 Anxiety disorder, unspecified: Secondary | ICD-10-CM | POA: Diagnosis not present

## 2015-11-21 DIAGNOSIS — N912 Amenorrhea, unspecified: Secondary | ICD-10-CM | POA: Diagnosis not present

## 2015-11-21 DIAGNOSIS — O926 Galactorrhea: Secondary | ICD-10-CM | POA: Diagnosis not present

## 2015-11-30 DIAGNOSIS — F419 Anxiety disorder, unspecified: Secondary | ICD-10-CM | POA: Diagnosis not present

## 2015-12-12 DIAGNOSIS — E538 Deficiency of other specified B group vitamins: Secondary | ICD-10-CM | POA: Diagnosis not present

## 2015-12-12 DIAGNOSIS — R5383 Other fatigue: Secondary | ICD-10-CM | POA: Diagnosis not present

## 2015-12-12 DIAGNOSIS — Z6834 Body mass index (BMI) 34.0-34.9, adult: Secondary | ICD-10-CM | POA: Diagnosis not present

## 2015-12-12 DIAGNOSIS — E669 Obesity, unspecified: Secondary | ICD-10-CM | POA: Diagnosis not present

## 2015-12-12 DIAGNOSIS — E559 Vitamin D deficiency, unspecified: Secondary | ICD-10-CM | POA: Diagnosis not present

## 2015-12-12 DIAGNOSIS — Z23 Encounter for immunization: Secondary | ICD-10-CM | POA: Diagnosis not present

## 2015-12-12 DIAGNOSIS — E785 Hyperlipidemia, unspecified: Secondary | ICD-10-CM | POA: Diagnosis not present

## 2015-12-14 DIAGNOSIS — F419 Anxiety disorder, unspecified: Secondary | ICD-10-CM | POA: Diagnosis not present

## 2015-12-14 DIAGNOSIS — F53 Puerperal psychosis: Secondary | ICD-10-CM | POA: Diagnosis not present

## 2015-12-14 DIAGNOSIS — F333 Major depressive disorder, recurrent, severe with psychotic symptoms: Secondary | ICD-10-CM | POA: Diagnosis not present

## 2015-12-26 DIAGNOSIS — R1013 Epigastric pain: Secondary | ICD-10-CM | POA: Diagnosis not present

## 2015-12-26 DIAGNOSIS — F419 Anxiety disorder, unspecified: Secondary | ICD-10-CM | POA: Diagnosis not present

## 2015-12-26 DIAGNOSIS — F329 Major depressive disorder, single episode, unspecified: Secondary | ICD-10-CM | POA: Diagnosis not present

## 2015-12-26 DIAGNOSIS — E049 Nontoxic goiter, unspecified: Secondary | ICD-10-CM | POA: Diagnosis not present

## 2015-12-26 DIAGNOSIS — E669 Obesity, unspecified: Secondary | ICD-10-CM | POA: Diagnosis not present

## 2015-12-26 DIAGNOSIS — E538 Deficiency of other specified B group vitamins: Secondary | ICD-10-CM | POA: Diagnosis not present

## 2015-12-26 DIAGNOSIS — K219 Gastro-esophageal reflux disease without esophagitis: Secondary | ICD-10-CM | POA: Diagnosis not present

## 2015-12-27 DIAGNOSIS — R070 Pain in throat: Secondary | ICD-10-CM | POA: Diagnosis not present

## 2015-12-27 DIAGNOSIS — K1321 Leukoplakia of oral mucosa, including tongue: Secondary | ICD-10-CM | POA: Diagnosis not present

## 2015-12-28 DIAGNOSIS — F333 Major depressive disorder, recurrent, severe with psychotic symptoms: Secondary | ICD-10-CM | POA: Diagnosis not present

## 2015-12-28 DIAGNOSIS — F410 Panic disorder [episodic paroxysmal anxiety] without agoraphobia: Secondary | ICD-10-CM | POA: Diagnosis not present

## 2016-01-09 DIAGNOSIS — K1321 Leukoplakia of oral mucosa, including tongue: Secondary | ICD-10-CM | POA: Diagnosis not present

## 2016-01-18 DIAGNOSIS — F333 Major depressive disorder, recurrent, severe with psychotic symptoms: Secondary | ICD-10-CM | POA: Diagnosis not present

## 2016-01-23 DIAGNOSIS — Z3169 Encounter for other general counseling and advice on procreation: Secondary | ICD-10-CM | POA: Diagnosis not present

## 2016-01-31 DIAGNOSIS — F419 Anxiety disorder, unspecified: Secondary | ICD-10-CM | POA: Diagnosis not present

## 2016-01-31 DIAGNOSIS — J029 Acute pharyngitis, unspecified: Secondary | ICD-10-CM | POA: Diagnosis not present

## 2016-01-31 DIAGNOSIS — F329 Major depressive disorder, single episode, unspecified: Secondary | ICD-10-CM | POA: Diagnosis not present

## 2016-02-16 DIAGNOSIS — E539 Vitamin B deficiency, unspecified: Secondary | ICD-10-CM | POA: Diagnosis not present

## 2016-02-16 DIAGNOSIS — G43909 Migraine, unspecified, not intractable, without status migrainosus: Secondary | ICD-10-CM | POA: Diagnosis not present

## 2016-02-16 DIAGNOSIS — F329 Major depressive disorder, single episode, unspecified: Secondary | ICD-10-CM | POA: Diagnosis not present

## 2016-02-16 DIAGNOSIS — E669 Obesity, unspecified: Secondary | ICD-10-CM | POA: Diagnosis not present

## 2016-02-16 DIAGNOSIS — F419 Anxiety disorder, unspecified: Secondary | ICD-10-CM | POA: Diagnosis not present

## 2016-03-13 DIAGNOSIS — R5383 Other fatigue: Secondary | ICD-10-CM | POA: Diagnosis not present

## 2016-03-13 DIAGNOSIS — G43909 Migraine, unspecified, not intractable, without status migrainosus: Secondary | ICD-10-CM | POA: Diagnosis not present

## 2016-03-13 DIAGNOSIS — E7800 Pure hypercholesterolemia, unspecified: Secondary | ICD-10-CM | POA: Diagnosis not present

## 2016-03-13 DIAGNOSIS — E669 Obesity, unspecified: Secondary | ICD-10-CM | POA: Diagnosis not present

## 2016-03-13 DIAGNOSIS — F329 Major depressive disorder, single episode, unspecified: Secondary | ICD-10-CM | POA: Diagnosis not present

## 2016-03-13 DIAGNOSIS — E559 Vitamin D deficiency, unspecified: Secondary | ICD-10-CM | POA: Diagnosis not present

## 2016-03-25 DIAGNOSIS — E282 Polycystic ovarian syndrome: Secondary | ICD-10-CM | POA: Diagnosis not present

## 2016-03-25 DIAGNOSIS — G43709 Chronic migraine without aura, not intractable, without status migrainosus: Secondary | ICD-10-CM | POA: Diagnosis not present

## 2016-03-25 DIAGNOSIS — M5481 Occipital neuralgia: Secondary | ICD-10-CM | POA: Diagnosis not present

## 2016-03-29 ENCOUNTER — Other Ambulatory Visit: Payer: Self-pay | Admitting: Neurology

## 2016-03-29 DIAGNOSIS — IMO0002 Reserved for concepts with insufficient information to code with codable children: Secondary | ICD-10-CM

## 2016-03-29 DIAGNOSIS — G43709 Chronic migraine without aura, not intractable, without status migrainosus: Secondary | ICD-10-CM

## 2016-04-10 ENCOUNTER — Encounter: Payer: Self-pay | Admitting: Radiology

## 2016-04-10 ENCOUNTER — Ambulatory Visit
Admission: RE | Admit: 2016-04-10 | Discharge: 2016-04-10 | Disposition: A | Discharge status: Home / Self Care | Payer: BLUE CROSS/BLUE SHIELD | Source: Ambulatory Visit | Attending: Neurology | Admitting: Neurology

## 2016-04-10 DIAGNOSIS — IMO0002 Reserved for concepts with insufficient information to code with codable children: Secondary | ICD-10-CM

## 2016-04-10 DIAGNOSIS — G43709 Chronic migraine without aura, not intractable, without status migrainosus: Secondary | ICD-10-CM | POA: Insufficient documentation

## 2016-04-10 DIAGNOSIS — G43909 Migraine, unspecified, not intractable, without status migrainosus: Secondary | ICD-10-CM | POA: Diagnosis not present

## 2016-04-10 DIAGNOSIS — J341 Cyst and mucocele of nose and nasal sinus: Secondary | ICD-10-CM | POA: Diagnosis not present

## 2016-04-10 DIAGNOSIS — E282 Polycystic ovarian syndrome: Secondary | ICD-10-CM | POA: Diagnosis not present

## 2016-04-10 MED ORDER — GADOBENATE DIMEGLUMINE 529 MG/ML IV SOLN
15.0000 mL | Freq: Once | INTRAVENOUS | Status: AC | PRN
  Administered 2016-04-10: 15 mL via INTRAVENOUS

## 2016-04-11 DIAGNOSIS — N970 Female infertility associated with anovulation: Secondary | ICD-10-CM | POA: Diagnosis not present

## 2016-04-24 ENCOUNTER — Other Ambulatory Visit: Payer: Self-pay | Admitting: Obstetrics and Gynecology

## 2016-04-24 MED ORDER — LETROZOLE 2.5 MG PO TABS: 5.0000 mg | ORAL_TABLET | Freq: Every day | ORAL | 0 refills | Status: DC

## 2016-04-24 NOTE — Progress Notes (Signed)
LMP 04/23/2016 Cycle II of letrozole 5mg  po daily x 5 days

## 2016-05-13 DIAGNOSIS — L700 Acne vulgaris: Secondary | ICD-10-CM | POA: Diagnosis not present

## 2016-05-13 DIAGNOSIS — L817 Pigmented purpuric dermatosis: Secondary | ICD-10-CM | POA: Diagnosis not present

## 2016-05-13 DIAGNOSIS — L300 Nummular dermatitis: Secondary | ICD-10-CM | POA: Diagnosis not present

## 2016-05-23 DIAGNOSIS — G43719 Chronic migraine without aura, intractable, without status migrainosus: Secondary | ICD-10-CM | POA: Diagnosis not present

## 2016-05-23 DIAGNOSIS — M5481 Occipital neuralgia: Secondary | ICD-10-CM | POA: Diagnosis not present

## 2016-05-23 DIAGNOSIS — R002 Palpitations: Secondary | ICD-10-CM | POA: Diagnosis not present

## 2016-05-24 DIAGNOSIS — G43719 Chronic migraine without aura, intractable, without status migrainosus: Secondary | ICD-10-CM | POA: Diagnosis not present

## 2016-05-29 ENCOUNTER — Telehealth: Payer: Self-pay

## 2016-05-29 NOTE — Telephone Encounter (Signed)
Pt calling to see what AMS would like her to do.  She is on cycle day 37 and has not started her period.  She doesn't think she ovulated this time.  Never had any positive OVKs.  [phone]

## 2016-05-29 NOTE — Telephone Encounter (Signed)
Please advise patient.  

## 2016-05-30 ENCOUNTER — Other Ambulatory Visit: Payer: Self-pay | Admitting: Obstetrics and Gynecology

## 2016-05-30 MED ORDER — MEDROXYPROGESTERONE ACETATE 10 MG PO TABS: 10.0000 mg | ORAL_TABLET | Freq: Every day | ORAL | 0 refills | Status: DC

## 2016-05-31 DIAGNOSIS — G43719 Chronic migraine without aura, intractable, without status migrainosus: Secondary | ICD-10-CM | POA: Diagnosis not present

## 2016-05-31 DIAGNOSIS — R002 Palpitations: Secondary | ICD-10-CM | POA: Diagnosis not present

## 2016-06-05 DIAGNOSIS — Z6833 Body mass index (BMI) 33.0-33.9, adult: Secondary | ICD-10-CM | POA: Diagnosis not present

## 2016-06-05 DIAGNOSIS — J310 Chronic rhinitis: Secondary | ICD-10-CM | POA: Diagnosis not present

## 2016-06-06 DIAGNOSIS — G43719 Chronic migraine without aura, intractable, without status migrainosus: Secondary | ICD-10-CM | POA: Diagnosis not present

## 2016-06-07 DIAGNOSIS — R002 Palpitations: Secondary | ICD-10-CM | POA: Diagnosis not present

## 2016-06-11 DIAGNOSIS — F53 Puerperal psychosis: Secondary | ICD-10-CM | POA: Diagnosis not present

## 2016-06-11 DIAGNOSIS — F410 Panic disorder [episodic paroxysmal anxiety] without agoraphobia: Secondary | ICD-10-CM | POA: Diagnosis not present

## 2016-06-11 DIAGNOSIS — Z6833 Body mass index (BMI) 33.0-33.9, adult: Secondary | ICD-10-CM | POA: Diagnosis not present

## 2016-06-14 ENCOUNTER — Telehealth: Payer: Self-pay

## 2016-06-14 DIAGNOSIS — N970 Female infertility associated with anovulation: Secondary | ICD-10-CM

## 2016-06-14 MED ORDER — LETROZOLE 2.5 MG PO TABS: 7.5000 mg | ORAL_TABLET | Freq: Every day | ORAL | 0 refills | Status: DC

## 2016-06-14 NOTE — Telephone Encounter (Signed)
Please advise 

## 2016-06-14 NOTE — Telephone Encounter (Signed)
Pt calling to be sure AMS gets her email she sent him this am.  Her prev emails went to spam.  Today is cycle day 1.  Wants to know if AMS will order letrozole - needs to start it Sunday. [phone]

## 2016-06-14 NOTE — Telephone Encounter (Signed)
Pt aware Rx has been sent in and to come in for labs on 5/25

## 2016-06-14 NOTE — Telephone Encounter (Signed)
Rx has been sent needs to come in 07/05/16 for labs

## 2016-07-05 ENCOUNTER — Other Ambulatory Visit: Payer: BLUE CROSS/BLUE SHIELD

## 2016-07-05 DIAGNOSIS — N970 Female infertility associated with anovulation: Secondary | ICD-10-CM | POA: Diagnosis not present

## 2016-07-06 LAB — PROGESTERONE: Progesterone: 11.9 ng/mL

## 2016-07-09 ENCOUNTER — Encounter: Payer: Self-pay | Admitting: Obstetrics and Gynecology

## 2016-07-10 ENCOUNTER — Telehealth: Payer: Self-pay

## 2016-07-10 DIAGNOSIS — J341 Cyst and mucocele of nose and nasal sinus: Secondary | ICD-10-CM | POA: Diagnosis not present

## 2016-07-10 DIAGNOSIS — J342 Deviated nasal septum: Secondary | ICD-10-CM | POA: Diagnosis not present

## 2016-07-10 DIAGNOSIS — J310 Chronic rhinitis: Secondary | ICD-10-CM | POA: Diagnosis not present

## 2016-07-10 NOTE — Telephone Encounter (Signed)
Pt calling to let AMS know she had a positive preg last night and again this morning.  What to do next?  [place] [facility]   [phone]

## 2016-07-11 NOTE — Telephone Encounter (Signed)
Spoke with Pt about first available appointment  June 25th with Dr. Jean RosenthalJackson. Pt refused schedule appointment. Pt states she only sees Dr. Bonney AidStaebler and he normally request blood work when she has a positive pregnancy test due to in infertility treatments she has had. Please advise scheduling for pt.

## 2016-07-11 NOTE — Telephone Encounter (Signed)
Please schedule for NOB with any provider.

## 2016-07-12 ENCOUNTER — Encounter: Payer: Self-pay | Admitting: Obstetrics and Gynecology

## 2016-07-12 ENCOUNTER — Other Ambulatory Visit: Payer: Self-pay | Admitting: Obstetrics and Gynecology

## 2016-07-12 ENCOUNTER — Telehealth: Payer: Self-pay | Admitting: Obstetrics and Gynecology

## 2016-07-12 ENCOUNTER — Other Ambulatory Visit: Payer: BLUE CROSS/BLUE SHIELD

## 2016-07-12 DIAGNOSIS — Z3201 Encounter for pregnancy test, result positive: Secondary | ICD-10-CM

## 2016-07-12 DIAGNOSIS — Z3490 Encounter for supervision of normal pregnancy, unspecified, unspecified trimester: Secondary | ICD-10-CM | POA: Diagnosis not present

## 2016-07-12 DIAGNOSIS — O3680X0 Pregnancy with inconclusive fetal viability, not applicable or unspecified: Secondary | ICD-10-CM

## 2016-07-12 NOTE — Telephone Encounter (Signed)
[facility] work on Sunday, they told her if she did not have a procedure scheduled she could not have her blood drawn on this day, so she is wondering what she needs to do.  cb [phone].

## 2016-07-13 LAB — BETA HCG QUANT (REF LAB): hCG Quant: 18 m[IU]/mL

## 2016-07-14 ENCOUNTER — Encounter: Payer: Self-pay | Admitting: Obstetrics and Gynecology

## 2016-07-14 ENCOUNTER — Other Ambulatory Visit
Admission: RE | Admit: 2016-07-14 | Discharge: 2016-07-14 | Disposition: A | Discharge status: Home / Self Care | Payer: BLUE CROSS/BLUE SHIELD | Source: Ambulatory Visit | Attending: Obstetrics and Gynecology | Admitting: Obstetrics and Gynecology

## 2016-07-14 ENCOUNTER — Other Ambulatory Visit: Payer: Self-pay

## 2016-07-14 DIAGNOSIS — Z3201 Encounter for pregnancy test, result positive: Secondary | ICD-10-CM | POA: Insufficient documentation

## 2016-07-14 DIAGNOSIS — O3680X0 Pregnancy with inconclusive fetal viability, not applicable or unspecified: Secondary | ICD-10-CM

## 2016-07-14 LAB — HCG, QUANTITATIVE, PREGNANCY: HCG, BETA CHAIN, QUANT, S: 56 m[IU]/mL — AB (ref <5)

## 2016-07-16 ENCOUNTER — Other Ambulatory Visit: Payer: Self-pay | Admitting: Obstetrics and Gynecology

## 2016-07-16 DIAGNOSIS — O3680X0 Pregnancy with inconclusive fetal viability, not applicable or unspecified: Secondary | ICD-10-CM

## 2016-07-18 ENCOUNTER — Other Ambulatory Visit: Payer: BLUE CROSS/BLUE SHIELD

## 2016-07-18 DIAGNOSIS — Z3490 Encounter for supervision of normal pregnancy, unspecified, unspecified trimester: Secondary | ICD-10-CM | POA: Diagnosis not present

## 2016-07-18 DIAGNOSIS — O3680X0 Pregnancy with inconclusive fetal viability, not applicable or unspecified: Secondary | ICD-10-CM

## 2016-07-19 ENCOUNTER — Encounter: Payer: Self-pay | Admitting: Obstetrics and Gynecology

## 2016-07-19 LAB — BETA HCG QUANT (REF LAB): hCG Quant: 343 m[IU]/mL

## 2016-07-22 ENCOUNTER — Telehealth: Payer: Self-pay | Admitting: Obstetrics and Gynecology

## 2016-07-22 NOTE — Telephone Encounter (Signed)
Pt states she spoke to AMS on Friday night. He wanted her to keep updating him. She is still having bleeding it can be anywhere from Bright red-dark. Sm. Cramping. She is aware AMS is on call and once he reviews this message he will call her to discuss plan and future labwork (order not placed).

## 2016-07-22 NOTE — Telephone Encounter (Signed)
Pt aware of AMS message via MyChart. KJ CMA

## 2016-07-22 NOTE — Telephone Encounter (Signed)
Pt is calling needing to speak with Selena BattenKim, Dr. Ramiro HarvestStaebler's nurse. Please advise

## 2016-07-22 NOTE — Telephone Encounter (Signed)
Nothing she early and her HCG went up appropriately.  She is not having abdominal pain so monitoring for now and I'll do a quick scan at bedside at her NOB or have a dating scan with her NOB which is next week I believe

## 2016-07-24 ENCOUNTER — Other Ambulatory Visit: Payer: Self-pay | Admitting: Obstetrics and Gynecology

## 2016-07-24 ENCOUNTER — Other Ambulatory Visit: Payer: BLUE CROSS/BLUE SHIELD

## 2016-07-24 DIAGNOSIS — O209 Hemorrhage in early pregnancy, unspecified: Secondary | ICD-10-CM | POA: Diagnosis not present

## 2016-07-24 DIAGNOSIS — O26851 Spotting complicating pregnancy, first trimester: Secondary | ICD-10-CM

## 2016-07-24 NOTE — Addendum Note (Signed)
Addended by: Lorrene ReidSTAEBLER, Chaunda Vandergriff M on: 07/24/2016 04:48 PM   Modules accepted: Orders

## 2016-07-25 LAB — BETA HCG QUANT (REF LAB): HCG QUANT: 64 m[IU]/mL

## 2016-07-30 ENCOUNTER — Encounter: Payer: Self-pay | Admitting: Obstetrics and Gynecology

## 2016-07-30 ENCOUNTER — Encounter: Payer: BLUE CROSS/BLUE SHIELD | Admitting: Obstetrics and Gynecology

## 2016-07-30 ENCOUNTER — Ambulatory Visit (INDEPENDENT_AMBULATORY_CARE_PROVIDER_SITE_OTHER): Payer: BLUE CROSS/BLUE SHIELD | Admitting: Obstetrics and Gynecology

## 2016-07-30 ENCOUNTER — Other Ambulatory Visit: Payer: BLUE CROSS/BLUE SHIELD

## 2016-07-30 VITALS — BP 140/76 | HR 89 | Wt 186.0 lb

## 2016-07-30 DIAGNOSIS — O039 Complete or unspecified spontaneous abortion without complication: Secondary | ICD-10-CM | POA: Diagnosis not present

## 2016-07-30 NOTE — Patient Instructions (Signed)
At present there is no evidence to support the use of vaginal progesterone in women with a history of unexplained recurrent first trimester miscarriage in an attempt to minimize risk of miscarriage in a subsequent pregnancy.  Its use did not show a clinically significant increase in live birth rate.  However, there were also no identified adverse events to its use.  This data was shared with the patient.    "A Randomized Trial of Progesterone in Women with Recurrent Miscarriages" NEJM 373;22 January 06, 2014

## 2016-08-02 NOTE — Progress Notes (Addendum)
Obstetrics & Gynecology Office Visit   Chief Complaint:  Chief Complaint  Patient presents with  . Follow-up    History of Present Illness: 26 year old G3P1011 presenting for follow up of first trimester bleeding.  HCG peaked at 33900mIU/ml has dropped to 64 with patient reporting passage of clots and tissue.  The bleeding has slowed down.  No further cramping.    Review of Systems: 10 point review of systems negative unless otherwise noted in HPI  Past Medical History:  Past Medical History:  Diagnosis Date  . Gestational diabetes   . PCOS (polycystic ovarian syndrome)     Past Surgical History:  Past Surgical History:  Procedure Laterality Date  . CESAREAN SECTION N/A 07/15/2014   Procedure: CESAREAN SECTION;  Surgeon: Nadara Mustardobert P Harris, MD;  Location: [facility] ORS;  Service: Obstetrics;  Laterality: N/A;  . HERNIA REPAIR  10/2013  . WISDOM TOOTH EXTRACTION      Gynecologic History: Patient's last menstrual period was 06/14/2016 (exact date).  Obstetric History: G3P1011  Family History:  Family History  Problem Relation Age of Onset  . Cancer Mother   . Diabetes Paternal Grandfather     Social History:  Social History   Social History  . Marital status: Married    Spouse name: N/A  . Number of children: N/A  . Years of education: N/A   Occupational History  . Not on file.   Social History Main Topics  . Smoking status: Never Smoker  . Smokeless tobacco: Never Used  . Alcohol use No  . Drug use: No  . Sexual activity: Yes   Other Topics Concern  . Not on file   Social History Narrative  . No narrative on file    Allergies:  Allergies  Allergen Reactions  . Fish Allergy Shortness Of Breath    Medications: Prior to Admission medications   Medication Sig Start Date End Date Taking? Authorizing Provider  Prenatal MV-Min-Fe Fum-FA-DHA (PRENATAL 1 PO) Take 1 tablet by mouth daily.   Yes [provider]  Butalbital-APAP-Caffeine 50-325-40 MG  capsule Take by mouth.    [provider]  gabapentin (NEURONTIN) 100 MG capsule Take by mouth.    [provider]    Physical Exam Vitals:  Vitals:   07/30/16 1503  BP: 140/76  Pulse: 89   Patient's last menstrual period was 06/14/2016 (exact date).  General: NAD HEENT: normocephalic, anicteric Pulmonary: No increased work of breathing Extremities: no edema, erythema, or tenderness Neurologic: Grossly intact Psychiatric: mood appropriate, affect full  Female chaperone present for pelvic and breast  portions of the physical exam  Assessment: 26 y.o. Z6X0960G3P1021 with completed abortion  Plan: Problem List Items Addressed This Visit    None    Visit Diagnoses    Completed inevitable abortion    -  Primary     -  Condolences were offered to the patient and her family.  I stressed that while emotionally difficult, that this did not occur because of an actions or inactions by the patient.  Somewhere between 10-20% of identified first trimester pregnancies will unfortunately end in miscarriage.  Given this relatively high incidence rate, further diagnostic testing such as chromosome analysis is generally not clinically relevant nor recommended.  Although the chromosomal abnormalities have been implicated at rates as high as 70% in some studies, these are generally random and do not infer and increased risk of recurrence with subsequent pregnancies.  However, 3 or more consecutive first  trimester losses are relatively uncommon, and these patient generally do benefit from additional work up to determine a potential modifiable etiology.    The patient did ask about possible progesterone issue.  At present there is no evidence to support the use of vaginal progesterone in women with a history of unexplained recurrent first trimester miscarriage in an attempt to minimize risk of miscarriage in a subsequent pregnancy.  Its use did not show a clinically significant increase in live  birth rate.  However, there were also no identified adverse events to its use.  This data was shared with the patient.    "A Randomized Trial of Progesterone in Women with Recurrent Miscarriages" NEJM 373;22 January 06, 2014  Will restart letrozole with next menstrual cycle.  A total of 15 minutes were spent in face-to-face contact with the patient during this encounter with over half of that time devoted to counseling and coordination of care.

## 2016-08-06 DIAGNOSIS — M25562 Pain in left knee: Secondary | ICD-10-CM | POA: Diagnosis not present

## 2016-08-09 ENCOUNTER — Encounter: Payer: Self-pay | Admitting: Obstetrics and Gynecology

## 2016-08-12 NOTE — Telephone Encounter (Signed)
Send to Dr Bonney AidStaebler, who it is addressed to

## 2016-08-13 ENCOUNTER — Other Ambulatory Visit: Payer: Self-pay | Admitting: Obstetrics and Gynecology

## 2016-08-13 MED ORDER — ESCITALOPRAM OXALATE 10 MG PO TABS: 10.0000 mg | ORAL_TABLET | Freq: Every day | ORAL | 6 refills | Status: DC

## 2016-08-19 ENCOUNTER — Encounter: Payer: Self-pay | Admitting: Obstetrics and Gynecology

## 2016-08-22 DIAGNOSIS — F419 Anxiety disorder, unspecified: Secondary | ICD-10-CM | POA: Diagnosis not present

## 2016-08-22 DIAGNOSIS — Z6833 Body mass index (BMI) 33.0-33.9, adult: Secondary | ICD-10-CM | POA: Diagnosis not present

## 2016-08-22 DIAGNOSIS — F53 Puerperal psychosis: Secondary | ICD-10-CM | POA: Diagnosis not present

## 2016-08-23 ENCOUNTER — Other Ambulatory Visit: Payer: Self-pay | Admitting: Obstetrics and Gynecology

## 2016-08-23 ENCOUNTER — Encounter: Payer: Self-pay | Admitting: Obstetrics and Gynecology

## 2016-08-23 MED ORDER — MEDROXYPROGESTERONE ACETATE 10 MG PO TABS: 10.0000 mg | ORAL_TABLET | Freq: Every day | ORAL | 0 refills | Status: DC

## 2016-09-05 ENCOUNTER — Encounter: Payer: Self-pay | Admitting: Obstetrics and Gynecology

## 2016-09-06 ENCOUNTER — Other Ambulatory Visit: Payer: Self-pay | Admitting: Obstetrics and Gynecology

## 2016-09-06 MED ORDER — LETROZOLE 2.5 MG PO TABS: 7.5000 mg | ORAL_TABLET | Freq: Every day | ORAL | 0 refills | Status: DC

## 2016-09-06 NOTE — Progress Notes (Signed)
LMP 09/06/16 start cycle I letrozole at prior dose 7.5mg 

## 2016-09-21 ENCOUNTER — Encounter: Payer: Self-pay | Admitting: Obstetrics and Gynecology

## 2016-09-22 ENCOUNTER — Encounter: Payer: Self-pay | Admitting: Obstetrics and Gynecology

## 2016-10-09 ENCOUNTER — Encounter: Payer: Self-pay | Admitting: Obstetrics and Gynecology

## 2016-10-09 NOTE — Telephone Encounter (Signed)
I dont see his plan spelled out clearly enough to intervene.  Send AMS email in case he responds and let pt know about him out of the office

## 2016-10-10 ENCOUNTER — Telehealth: Payer: Self-pay

## 2016-10-10 MED ORDER — LETROZOLE 2.5 MG PO TABS: 7.5000 mg | ORAL_TABLET | Freq: Every day | ORAL | 0 refills | Status: DC

## 2016-10-10 NOTE — Telephone Encounter (Signed)
Pt calling c questions for KJ  [phone].  Courtesy call to pt that KJ isn't in office today.  Pt is asking if AMS could be texted by MD about rx for lotrizole as it is time sensitive and she is supposed to start it tomorrow b/c she started her period yesterday.  Adv I would ask.  Pharm correct in chart.  Pt doesn't want to lose chance on this cycle.

## 2016-10-10 NOTE — Telephone Encounter (Signed)
Let her know will ePrescribe Letrizol at former dose.  No other way of knowing what AMS plan was for her this cycle but this is last months dose and should be usual follow up dose.

## 2016-10-11 NOTE — Telephone Encounter (Signed)
Pt aware.

## 2016-10-15 NOTE — Telephone Encounter (Signed)
Pt has been seen 

## 2016-11-04 DIAGNOSIS — G43719 Chronic migraine without aura, intractable, without status migrainosus: Secondary | ICD-10-CM | POA: Diagnosis not present

## 2016-11-04 DIAGNOSIS — M5481 Occipital neuralgia: Secondary | ICD-10-CM | POA: Diagnosis not present

## 2016-11-10 ENCOUNTER — Encounter: Payer: Self-pay | Admitting: Obstetrics and Gynecology

## 2016-11-11 ENCOUNTER — Other Ambulatory Visit: Payer: Self-pay | Admitting: Obstetrics and Gynecology

## 2016-11-11 MED ORDER — LETROZOLE 2.5 MG PO TABS: 7.5000 mg | ORAL_TABLET | Freq: Every day | ORAL | 0 refills | Status: DC

## 2016-11-11 NOTE — Progress Notes (Signed)
Cycle II letrozole 7.5mg  following SAB LMP 11/10/16

## 2016-12-05 DIAGNOSIS — F419 Anxiety disorder, unspecified: Secondary | ICD-10-CM | POA: Diagnosis not present

## 2016-12-05 DIAGNOSIS — F53 Postpartum depression: Secondary | ICD-10-CM | POA: Diagnosis not present

## 2016-12-05 DIAGNOSIS — Z6833 Body mass index (BMI) 33.0-33.9, adult: Secondary | ICD-10-CM | POA: Diagnosis not present

## 2016-12-11 ENCOUNTER — Other Ambulatory Visit: Payer: Self-pay | Admitting: Obstetrics and Gynecology

## 2016-12-11 ENCOUNTER — Encounter: Payer: Self-pay | Admitting: Obstetrics and Gynecology

## 2016-12-11 MED ORDER — LETROZOLE 2.5 MG PO TABS: 7.5000 mg | ORAL_TABLET | Freq: Every day | ORAL | 0 refills | Status: DC

## 2016-12-11 NOTE — Progress Notes (Unsigned)
Letrozole 7.5mg  LMP 12/11/16

## 2016-12-27 ENCOUNTER — Encounter: Payer: Self-pay | Admitting: Obstetrics and Gynecology

## 2016-12-27 ENCOUNTER — Ambulatory Visit (INDEPENDENT_AMBULATORY_CARE_PROVIDER_SITE_OTHER): Payer: BLUE CROSS/BLUE SHIELD | Admitting: Obstetrics and Gynecology

## 2016-12-27 VITALS — BP 120/78 | HR 110 | Ht 59.0 in | Wt 186.0 lb

## 2016-12-27 DIAGNOSIS — B373 Candidiasis of vulva and vagina: Secondary | ICD-10-CM | POA: Diagnosis not present

## 2016-12-27 DIAGNOSIS — B3731 Acute candidiasis of vulva and vagina: Secondary | ICD-10-CM

## 2016-12-27 MED ORDER — TERCONAZOLE 0.4 % VA CREA: 1.0000 | TOPICAL_CREAM | Freq: Every day | VAGINAL | 1 refills | Status: DC

## 2016-12-27 NOTE — Progress Notes (Signed)
Obstetrics & Gynecology Office Visit   Chief Complaint:  Chief Complaint  Patient presents with  . Vaginal irritation    Hurts to urinate, discharge, vaginal odor, and itching     History of Present Illness: [name] is a 26 y.o. G3P1011 who LMP was Patient's last menstrual period was 12/11/2016 (exact date)., presents today for a problem visit.   Patient complains of an abnormal vaginal discharge for 3 day. Discharge described as: white and thick. Vaginal symptoms include local irritation, odor and vulvar itching.  STI Risk: Very low risk of STD exposure.   Other associated symptoms: none.Menstrual pattern: She had been bleeding regularly. Contraception: none.  She denies recent antibiotic exposure, denies changes in soaps, detergents coinciding with the onset of her symptoms.  She has not previously self treated or been under treatment by another provider for these symptoms.   Review of Systems: Review of Systems  Gastrointestinal: Negative for abdominal pain.  Genitourinary: Positive for dysuria. Negative for frequency, hematuria and urgency.  Skin: Positive for itching. Negative for rash.     Past Medical History:  Past Medical History:  Diagnosis Date  . Gestational diabetes   . PCOS (polycystic ovarian syndrome)     Past Surgical History:  Past Surgical History:  Procedure Laterality Date  . CESAREAN SECTION N/A 07/15/2014   Performed by Nadara MustardHarris, Robert P, [facility]  . HERNIA REPAIR  10/2013  . WISDOM TOOTH EXTRACTION      Gynecologic History: Patient's last menstrual period was 12/11/2016 (exact date).  Obstetric History: G3P1011  Family History:  Family History  Problem Relation Age of Onset  . Cancer Mother   . Diabetes Paternal Grandfather     Social History:  Social History   Socioeconomic History  . Marital status: Married    Spouse name: Not on file  . Number of children: Not on file  . Years of education: Not on file  . Highest  education level: Not on file  Social Needs  . Financial resource strain: Not on file  . Food insecurity - worry: Not on file  . Food insecurity - inability: Not on file  . Transportation needs - medical: Not on file  . Transportation needs - non-medical: Not on file  Occupational History  . Not on file  Tobacco Use  . Smoking status: Never Smoker  . Smokeless tobacco: Never Used  Substance and Sexual Activity  . Alcohol use: No  . Drug use: No  . Sexual activity: Yes    Birth control/protection: None  Other Topics Concern  . Not on file  Social History Narrative  . Not on file    Allergies:  Allergies  Allergen Reactions  . Fish Allergy Shortness Of Breath    Medications: Prior to Admission medications   Medication Sig Start Date End Date Taking? Authorizing Provider  butalbital-acetaminophen-caffeine (FIORICET WITH CODEINE) 50-325-40-30 MG capsule Take by mouth.   Yes [provider]  Butalbital-APAP-Caffeine 50-325-40 MG capsule Take by mouth.   Yes [provider]  escitalopram (LEXAPRO) 10 MG tablet Take 1 tablet (10 mg total) by mouth daily. 08/13/16 08/13/17 Yes Vena AustriaStaebler, Syerra Abdelrahman, MD  fluticasone (FLONASE) 50 MCG/ACT nasal spray 2 sprays by Each Nare route Two (2) times a day. 06/05/16 06/05/17 Yes [provider]  letrozole (FEMARA) 2.5 MG tablet Take 3 tablets (7.5 mg total) by mouth daily. 12/11/16  Yes Vena AustriaStaebler, Sieara Bremer, MD  medroxyPROGESTERone (PROVERA) 10 MG tablet Take 1 tablet (  10 mg total) by mouth daily. Use for ten days 08/23/16  Yes Vena AustriaStaebler, Braiden Rodman, MD  Prenatal MV-Min-Fe Fum-FA-DHA (PRENATAL 1 PO) Take 1 tablet by mouth daily.   Yes [provider]  terconazole (TERAZOL 7) 0.4 % vaginal cream Place 1 applicator at bedtime vaginally. 12/27/16   Vena AustriaStaebler, Julieana Eshleman, MD    Physical Exam Vitals:  Vitals:   12/27/16 1410  BP: 120/78  Pulse: (!) 110   Patient's last menstrual period was 12/11/2016 (exact date).  General:  NAD HEENT: normocephalic, anicteric Pulmonary: No increased work of breathing Genitourinary:  External: Normal external female genitalia.  Normal urethral meatus, normal  Bartholin's and Skene's glands.    Vagina: Normal vaginal mucosa, no evidence of prolapse.    Rectal: deferred  Lymphatic: no evidence of inguinal lymphadenopathy Extremities: no edema, erythema, or tenderness Neurologic: Grossly intact Psychiatric: mood appropriate, affect full  Female chaperone present for pelvic portions of the physical exam  Wet Prep: PH: <4.5 Clue Cells: Negative Fungal elements: Positive Trichomonas: Negative  Assessment: 26 y.o. Z6X0960G3P1011 with vaginal candida  Plan: Problem List Items Addressed This Visit    None     1) Vaginal candida  - rx diflucan - discussed changes in pH, antibiotics, and changes in detergents may all predispose to vaginal candida - patient currently on letrozole cycle trying to conceive discussed no concern with terazole exposure.  2) Infertility  - patient concerned that she missed most of her fertile window secondary to discomfort and not being able to be sexually active over past week - discussed that she did conceive on letrozole earlier this year which would argue for patent tubes, viable semen in partner - discussed finishing out 6 cycles, willing to perform additional cycle but also discussed possibility of stepping up to IUI if unable to conceive  3) A total of 15 minutes were spent in face-to-face contact with the patient during this encounter with over half of that time devoted to counseling and coordination of care.  4) Return if symptoms worsen or fail to improve.

## 2017-01-13 ENCOUNTER — Encounter: Payer: Self-pay | Admitting: Obstetrics and Gynecology

## 2017-01-14 ENCOUNTER — Other Ambulatory Visit: Payer: Self-pay | Admitting: Obstetrics and Gynecology

## 2017-01-14 MED ORDER — LETROZOLE 2.5 MG PO TABS: 7.5000 mg | ORAL_TABLET | Freq: Every day | ORAL | 0 refills | Status: DC

## 2017-01-14 NOTE — Progress Notes (Signed)
LMP 01/14/17 cycle V letrozole 7.755mh  Letrozole dates 5/4 Cycle I 7.5mg  Miscarriage 7/27 Cycle I 7.5mg  8/30 Cycle II 7.5mg  10/1Cycle III 7.5mg  10/31 Cycle IV 7.5mg 

## 2017-01-17 ENCOUNTER — Encounter: Payer: Self-pay | Admitting: Obstetrics and Gynecology

## 2017-02-13 ENCOUNTER — Encounter: Payer: Self-pay | Admitting: Obstetrics and Gynecology

## 2017-02-13 ENCOUNTER — Other Ambulatory Visit: Payer: Self-pay | Admitting: Obstetrics and Gynecology

## 2017-02-13 MED ORDER — LETROZOLE 2.5 MG PO TABS: 7.5000 mg | ORAL_TABLET | Freq: Every day | ORAL | 0 refills | Status: DC

## 2017-02-13 NOTE — Progress Notes (Signed)
LMP 02/14/16 Cycle VI letrozole 7.5mg 

## 2017-02-20 DIAGNOSIS — Z6833 Body mass index (BMI) 33.0-33.9, adult: Secondary | ICD-10-CM | POA: Diagnosis not present

## 2017-02-20 DIAGNOSIS — E282 Polycystic ovarian syndrome: Secondary | ICD-10-CM | POA: Diagnosis not present

## 2017-02-21 ENCOUNTER — Telehealth: Payer: Self-pay | Admitting: Obstetrics and Gynecology

## 2017-02-28 DIAGNOSIS — E282 Polycystic ovarian syndrome: Secondary | ICD-10-CM | POA: Diagnosis not present

## 2017-02-28 DIAGNOSIS — Z6834 Body mass index (BMI) 34.0-34.9, adult: Secondary | ICD-10-CM | POA: Diagnosis not present

## 2017-03-07 DIAGNOSIS — F411 Generalized anxiety disorder: Secondary | ICD-10-CM | POA: Diagnosis not present

## 2017-03-07 DIAGNOSIS — F53 Postpartum depression: Secondary | ICD-10-CM | POA: Diagnosis not present

## 2017-03-20 ENCOUNTER — Encounter: Payer: Self-pay | Admitting: Obstetrics and Gynecology

## 2017-03-20 ENCOUNTER — Other Ambulatory Visit: Payer: Self-pay | Admitting: Obstetrics and Gynecology

## 2017-03-20 MED ORDER — LETROZOLE 2.5 MG PO TABS: 7.5000 mg | ORAL_TABLET | Freq: Every day | ORAL | 0 refills | Status: DC

## 2017-03-20 NOTE — Progress Notes (Signed)
LMP 03/20/17 Cycle VII Letrozole 7.5mg  consider repeat labs and REI referral if failure to conceive

## 2017-03-21 DIAGNOSIS — F53 Postpartum depression: Secondary | ICD-10-CM | POA: Diagnosis not present

## 2017-03-21 DIAGNOSIS — F411 Generalized anxiety disorder: Secondary | ICD-10-CM | POA: Diagnosis not present

## 2017-03-25 ENCOUNTER — Other Ambulatory Visit: Payer: Self-pay | Admitting: Obstetrics and Gynecology

## 2017-03-25 DIAGNOSIS — N911 Secondary amenorrhea: Secondary | ICD-10-CM

## 2017-03-25 DIAGNOSIS — N970 Female infertility associated with anovulation: Secondary | ICD-10-CM

## 2017-03-27 ENCOUNTER — Other Ambulatory Visit: Payer: BLUE CROSS/BLUE SHIELD

## 2017-03-27 DIAGNOSIS — N970 Female infertility associated with anovulation: Secondary | ICD-10-CM

## 2017-03-27 DIAGNOSIS — N911 Secondary amenorrhea: Secondary | ICD-10-CM | POA: Diagnosis not present

## 2017-04-01 LAB — TSH+PRL+FSH+TESTT+LH+DHEA S...
17-Hydroxyprogesterone: 86 ng/dL
ANDROSTENEDIONE: 194 ng/dL (ref 41–262)
DHEA-SO4: 146.1 ug/dL (ref 84.8–378.0)
FSH: 5.7 m[IU]/mL
LH: 11.3 m[IU]/mL
PROLACTIN: 8.8 ng/mL (ref 4.8–23.3)
TESTOSTERONE: 29 ng/dL (ref 8–48)
TSH: 2.17 u[IU]/mL (ref 0.450–4.500)
Testosterone, Free: 1.7 pg/mL (ref 0.0–4.2)

## 2017-04-02 ENCOUNTER — Encounter: Payer: Self-pay | Admitting: Obstetrics and Gynecology

## 2017-04-09 DIAGNOSIS — G43909 Migraine, unspecified, not intractable, without status migrainosus: Secondary | ICD-10-CM | POA: Insufficient documentation

## 2017-04-10 ENCOUNTER — Other Ambulatory Visit: Payer: Self-pay | Admitting: Obstetrics and Gynecology

## 2017-04-10 MED ORDER — METFORMIN HCL 500 MG PO TABS: 500.0000 mg | ORAL_TABLET | Freq: Two times a day (BID) | ORAL | 11 refills | Status: DC

## 2017-04-11 DIAGNOSIS — F419 Anxiety disorder, unspecified: Secondary | ICD-10-CM | POA: Diagnosis not present

## 2017-04-21 ENCOUNTER — Other Ambulatory Visit: Payer: Self-pay | Admitting: Obstetrics and Gynecology

## 2017-04-21 ENCOUNTER — Encounter: Payer: Self-pay | Admitting: Obstetrics and Gynecology

## 2017-04-21 MED ORDER — LETROZOLE 2.5 MG PO TABS: 7.5000 mg | ORAL_TABLET | Freq: Every day | ORAL | 0 refills | Status: DC

## 2017-04-21 NOTE — Progress Notes (Signed)
LMP 04/21/17 Letrozole 7.5mg  and metformin cycle I (priro 6 cycles letrozole alone)

## 2017-05-09 DIAGNOSIS — Z6833 Body mass index (BMI) 33.0-33.9, adult: Secondary | ICD-10-CM | POA: Diagnosis not present

## 2017-05-09 DIAGNOSIS — F411 Generalized anxiety disorder: Secondary | ICD-10-CM | POA: Diagnosis not present

## 2017-05-09 DIAGNOSIS — F53 Postpartum depression: Secondary | ICD-10-CM | POA: Diagnosis not present

## 2017-05-25 ENCOUNTER — Encounter: Payer: Self-pay | Admitting: Obstetrics and Gynecology

## 2017-05-27 ENCOUNTER — Other Ambulatory Visit: Payer: Self-pay | Admitting: Obstetrics and Gynecology

## 2017-05-27 ENCOUNTER — Encounter: Payer: Self-pay | Admitting: Obstetrics and Gynecology

## 2017-05-27 MED ORDER — LETROZOLE 2.5 MG PO TABS: 7.5000 mg | ORAL_TABLET | Freq: Every day | ORAL | 0 refills | Status: DC

## 2017-06-20 ENCOUNTER — Encounter: Payer: Self-pay | Admitting: Obstetrics and Gynecology

## 2017-06-20 DIAGNOSIS — F419 Anxiety disorder, unspecified: Secondary | ICD-10-CM | POA: Diagnosis not present

## 2017-06-24 ENCOUNTER — Other Ambulatory Visit: Payer: BLUE CROSS/BLUE SHIELD

## 2017-06-24 ENCOUNTER — Other Ambulatory Visit: Payer: Self-pay | Admitting: Obstetrics and Gynecology

## 2017-06-24 DIAGNOSIS — O2620 Pregnancy care for patient with recurrent pregnancy loss, unspecified trimester: Secondary | ICD-10-CM | POA: Diagnosis not present

## 2017-06-24 DIAGNOSIS — O3680X0 Pregnancy with inconclusive fetal viability, not applicable or unspecified: Secondary | ICD-10-CM

## 2017-06-24 NOTE — Telephone Encounter (Signed)
Needs HCG measurement 48-hrs apart so today and thursday

## 2017-06-25 ENCOUNTER — Encounter (INDEPENDENT_AMBULATORY_CARE_PROVIDER_SITE_OTHER): Payer: Self-pay

## 2017-06-25 ENCOUNTER — Encounter: Payer: Self-pay | Admitting: Obstetrics and Gynecology

## 2017-06-25 LAB — BETA HCG QUANT (REF LAB): hCG Quant: 197 m[IU]/mL

## 2017-06-26 ENCOUNTER — Other Ambulatory Visit: Payer: BLUE CROSS/BLUE SHIELD

## 2017-06-26 ENCOUNTER — Other Ambulatory Visit: Payer: Self-pay | Admitting: Obstetrics and Gynecology

## 2017-06-26 DIAGNOSIS — O3680X0 Pregnancy with inconclusive fetal viability, not applicable or unspecified: Secondary | ICD-10-CM | POA: Diagnosis not present

## 2017-06-26 DIAGNOSIS — O2620 Pregnancy care for patient with recurrent pregnancy loss, unspecified trimester: Secondary | ICD-10-CM | POA: Diagnosis not present

## 2017-06-27 ENCOUNTER — Encounter: Payer: Self-pay | Admitting: Obstetrics and Gynecology

## 2017-06-27 ENCOUNTER — Other Ambulatory Visit: Payer: Self-pay | Admitting: Obstetrics and Gynecology

## 2017-06-27 DIAGNOSIS — Z3689 Encounter for other specified antenatal screening: Secondary | ICD-10-CM

## 2017-06-27 LAB — BETA HCG QUANT (REF LAB): HCG QUANT: 479 m[IU]/mL

## 2017-07-02 ENCOUNTER — Encounter: Payer: Self-pay | Admitting: Obstetrics and Gynecology

## 2017-07-02 DIAGNOSIS — F419 Anxiety disorder, unspecified: Secondary | ICD-10-CM | POA: Diagnosis not present

## 2017-07-03 ENCOUNTER — Ambulatory Visit (INDEPENDENT_AMBULATORY_CARE_PROVIDER_SITE_OTHER): Payer: BLUE CROSS/BLUE SHIELD | Admitting: Obstetrics and Gynecology

## 2017-07-03 ENCOUNTER — Telehealth: Payer: Self-pay

## 2017-07-03 ENCOUNTER — Ambulatory Visit (INDEPENDENT_AMBULATORY_CARE_PROVIDER_SITE_OTHER): Payer: BLUE CROSS/BLUE SHIELD

## 2017-07-03 ENCOUNTER — Ambulatory Visit: Payer: BLUE CROSS/BLUE SHIELD | Admitting: Obstetrics and Gynecology

## 2017-07-03 ENCOUNTER — Encounter: Payer: Self-pay | Admitting: Obstetrics and Gynecology

## 2017-07-03 VITALS — BP 114/68 | HR 96 | Ht 59.0 in | Wt 186.0 lb

## 2017-07-03 DIAGNOSIS — O209 Hemorrhage in early pregnancy, unspecified: Secondary | ICD-10-CM | POA: Diagnosis not present

## 2017-07-03 DIAGNOSIS — O3680X0 Pregnancy with inconclusive fetal viability, not applicable or unspecified: Secondary | ICD-10-CM | POA: Diagnosis not present

## 2017-07-03 DIAGNOSIS — Z3689 Encounter for other specified antenatal screening: Secondary | ICD-10-CM | POA: Diagnosis not present

## 2017-07-03 MED ORDER — PROGESTERONE 400 MG VA SUPP: 400.0000 mg | Freq: Two times a day (BID) | VAGINAL | 4 refills | Status: DC

## 2017-07-03 NOTE — Telephone Encounter (Signed)
Randi w/[facility] calling. They just rcvd a transfer from [facility]. She needs to verify strength of the suppositories and directions. [facility] gave her  suppositories 1 BID. Pt states  BID. Typically the highest strength is . Cb#[phone]. AMS rx'd

## 2017-07-03 NOTE — Telephone Encounter (Signed)
PRISM trial was a multicenter, double-blinded, randomized control trial conducted in the [place] looking at the effects of progesterone supplementation in women with threatened miscarriage. Women were adminstered  of vaginal progesterone twice daily from onset of bleeding through [redacted] weeks gestation.  The trial included 4038 women.  There was no significant improvement in livebirth rates demonstrated in the overall cohort (delivery after 34 weeks).  However in women with a history of one prior miscarriage progesterone group 76% resulted in liveborn pregnancy vs 72% for placebo (RR 1.05 95% CI 1.00 to 1.12).  For women with three or more first trimester miscarriages the effect was more pronounced with 72% liveborn pregnancy rate vs 57% for placebo (RR 1.28 95% CI 1.08 to 1.151).  "A Randomized Trial of Progesterone in Women with Bleeding in Early Pregnancy" Beryle Flock, MD et al NEJM 2019, [phone]

## 2017-07-04 LAB — BETA HCG QUANT (REF LAB): hCG Quant: 8888 m[IU]/mL

## 2017-07-04 LAB — PROGESTERONE: PROGESTERONE: 33 ng/mL

## 2017-07-04 NOTE — Progress Notes (Signed)
Gynecology Ultrasound Follow Up  Chief Complaint:  Chief Complaint  Patient presents with  . Follow-up    Early OB spotting     History of Present Illness: Patient is a 27 y.o. G69P1011 female who presents today for ultrasound evaluation of infertility, history of miscarriage within the past 12 months, positive home pregnancy test.  HCG levels were checked and noted to be rising appropriately.  No cramping or bleeding.  Ultrasound demonstrates the following findgins Adnexa: normal, right ovary with small solid cystic component Uterus: IUP with yolk sac no fetal pole Additional: no free fluid  Review of Systems: Review of Systems  Constitutional: Negative.   Gastrointestinal: Negative.     Past Medical History:  Past Medical History:  Diagnosis Date  . Gestational diabetes   . PCOS (polycystic ovarian syndrome)     Past Surgical History:  Past Surgical History:  Procedure Laterality Date  . CESAREAN SECTION N/A 07/15/2014   Procedure: CESAREAN SECTION;  Surgeon: Nadara Mustard, MD;  Location: [facility] ORS;  Service: Obstetrics;  Laterality: N/A;  . HERNIA REPAIR  10/2013  . WISDOM TOOTH EXTRACTION      Gynecologic History:  Patient's last menstrual period was 05/25/2017.  Family History:  Family History  Problem Relation Age of Onset  . Cancer Mother   . Diabetes Paternal Grandfather     Social History:  Social History   Socioeconomic History  . Marital status: Married    Spouse name: Not on file  . Number of children: Not on file  . Years of education: Not on file  . Highest education level: Not on file  Occupational History  . Not on file  Social Needs  . Financial resource strain: Not on file  . Food insecurity:    Worry: Not on file    Inability: Not on file  . Transportation needs:    Medical: Not on file    Non-medical: Not on file  Tobacco Use  . Smoking status: Never Smoker  . Smokeless tobacco: Never Used  Substance and Sexual Activity  .  Alcohol use: No  . Drug use: No  . Sexual activity: Yes    Birth control/protection: None  Lifestyle  . Physical activity:    Days per week: 0 days    Minutes per session: 0 min  . Stress: To some extent  Relationships  . Social connections:    Talks on phone: More than three times a week    Gets together: Three times a week    Attends religious service: Never    Active member of club or organization: No    Attends meetings of clubs or organizations: Never    Relationship status: Married  . Intimate partner violence:    Fear of current or ex partner: No    Emotionally abused: No    Physically abused: No    Forced sexual activity: No  Other Topics Concern  . Not on file  Social History Narrative  . Not on file    Allergies:  Allergies  Allergen Reactions  . Fish Allergy Shortness Of Breath    Medications: Prior to Admission medications   Medication Sig Start Date End Date Taking? Authorizing Provider  escitalopram (LEXAPRO) 10 MG tablet Take 1 tablet (10 mg total) by mouth daily. 08/13/16 08/13/17 Yes Vena [place], MD  letrozole [facility]) 2.5 MG tablet Take 3 tablets (7.5 mg total) by mouth daily. 05/27/17  Yes Vena [place], MD  metFORMIN (GLUCOPHAGE) 500 MG  tablet Take 1 tablet (500 mg total) by mouth 2 (two) times daily with a meal. 04/10/17  Yes Vena [place], MD  Prenatal MV-Min-Fe Fum-FA-DHA (PRENATAL 1 PO) Take 1 tablet by mouth daily.   Yes [provider]  butalbital-acetaminophen-caffeine (FIORICET WITH CODEINE) 50-325-40-30 MG capsule Take by mouth.    [provider]  Butalbital-APAP-Caffeine [phone] MG capsule Take by mouth.    [provider]  fluticasone (FLONASE) 50 MCG/ACT nasal spray 2 sprays by Each Nare route Two (2) times a day. 06/05/16 06/05/17  [provider]  medroxyPROGESTERone (PROVERA) 10 MG tablet Take 1 tablet (10 mg total) by mouth daily. Use for ten days Patient not taking: Reported on 07/03/2017  08/23/16   Vena [place], MD  Progesterone 400 MG SUPP Place 1 suppository (400 mg total) vaginally 2 (two) times daily. 07/03/17   Vena [place], MD  terconazole (TERAZOL 7) 0.4 % vaginal cream Place 1 applicator at bedtime vaginally. Patient not taking: Reported on 07/03/2017 12/27/16   Vena Austria, MD    Physical Exam Vitals: Blood pressure 114/68, pulse 96, height  (1.499 m), weight 186 lb (84.4 kg), last menstrual period 05/25/2017.  General: NAD HEENT: normocephalic, anicteric Pulmonary: No increased work of breathing Extremities: no edema, erythema, or tenderness Neurologic: Grossly intact, normal gait Psychiatric: mood appropriate, affect full   Assessment: 27 y.o. G3P1011 No problem-specific Assessment & Plan notes found for this encounter.   Plan: Problem List Items Addressed This Visit    None    Visit Diagnoses    First trimester bleeding    -  Primary   Relevant Orders   Beta hCG quant (ref lab) (Completed)   Progesterone (Completed)   Pregnancy with inconclusive fetal viability, single or unspecified fetus       Relevant Orders   Beta hCG quant (ref lab) (Completed)   Progesterone (Completed)      1) Pregnancy of uncertain viability - repeat HCG today and progesterone.  Re-image in 2 weeks with formal ultrasound.  I will see patient in 1 week for ROB/NOB  2) Return in about 1 week (around 07/10/2017) for Follow up [place].    Vena [place], MD, Evern Core [facility], [facility] 07/04/2017, 1:29 PM

## 2017-07-08 ENCOUNTER — Encounter: Payer: Self-pay | Admitting: Obstetrics and Gynecology

## 2017-07-08 ENCOUNTER — Ambulatory Visit (INDEPENDENT_AMBULATORY_CARE_PROVIDER_SITE_OTHER): Payer: BLUE CROSS/BLUE SHIELD | Admitting: Obstetrics and Gynecology

## 2017-07-08 VITALS — BP 110/74 | HR 105 | Wt 184.0 lb

## 2017-07-08 DIAGNOSIS — O209 Hemorrhage in early pregnancy, unspecified: Secondary | ICD-10-CM

## 2017-07-08 NOTE — Progress Notes (Signed)
    Routine Prenatal Care Visit  Subjective  Denise Walker is a 27 y.o. G3P1011 at Unknown being seen today for ongoing prenatal care.  She is currently monitored for the following issues for this low-risk pregnancy and has S/P cesarean section and Pregnancy, location unknown on their problem list.  ----------------------------------------------------------------------------------- Patient reports no complaints.    .  .   . Denies leaking of fluid.  ----------------------------------------------------------------------------------- The following portions of the patient's history were reviewed and updated as appropriate: allergies, current medications, past family history, past medical history, past social history, past surgical history and problem list. Problem list updated.   Objective  Blood pressure 110/74, pulse (!) 105, weight 184 lb (83.5 kg). Pregravid weight No episode found Total Weight Gain Not found. Urinalysis:      Fetal Status:           General:  Alert, oriented and cooperative. Patient is in no acute distress.  Skin: Skin is warm and dry. No rash noted.   Cardiovascular: Normal heart rate noted  Respiratory: Normal respiratory effort, no problems with respiration noted  Abdomen: Soft, gravid, appropriate for gestational age.       Pelvic:  Cervical exam deferred        Extremities: Normal range of motion.     ental Status: Normal mood and affect. Normal behavior. Normal judgment and thought content.     Assessment   26 y.o. G3P1011 at Unknown by  Not found. presenting for routine prenatal visit  Plan   Problem List    No episode was linked to this visit.       Gestational age appropriate obstetric precautions including but not limited to vaginal bleeding, contractions, leaking of fluid and fetal movement were reviewed in detail with the patient.   - [place] today with viable IUP [redacted]w[redacted]d CRL and FHT 75 - continue weekly follow ups  Return in about 1 week (around  07/15/2017) for ROb and follow up viability dating scan (should be scheduled already).  Vena [place], MD, [facility], [facility] 07/08/2017, 1:13 PM

## 2017-07-14 ENCOUNTER — Ambulatory Visit (INDEPENDENT_AMBULATORY_CARE_PROVIDER_SITE_OTHER): Payer: BLUE CROSS/BLUE SHIELD

## 2017-07-14 ENCOUNTER — Ambulatory Visit (INDEPENDENT_AMBULATORY_CARE_PROVIDER_SITE_OTHER): Payer: BLUE CROSS/BLUE SHIELD | Admitting: Obstetrics and Gynecology

## 2017-07-14 ENCOUNTER — Encounter: Payer: Self-pay | Admitting: Obstetrics and Gynecology

## 2017-07-14 ENCOUNTER — Other Ambulatory Visit: Payer: Self-pay | Admitting: Obstetrics and Gynecology

## 2017-07-14 VITALS — BP 120/80 | Wt 184.0 lb

## 2017-07-14 DIAGNOSIS — Z0189 Encounter for other specified special examinations: Secondary | ICD-10-CM

## 2017-07-14 DIAGNOSIS — O9989 Other specified diseases and conditions complicating pregnancy, childbirth and the puerperium: Secondary | ICD-10-CM

## 2017-07-14 DIAGNOSIS — O283 Abnormal ultrasonic finding on antenatal screening of mother: Secondary | ICD-10-CM | POA: Diagnosis not present

## 2017-07-14 DIAGNOSIS — Z3A01 Less than 8 weeks gestation of pregnancy: Secondary | ICD-10-CM

## 2017-07-14 DIAGNOSIS — Z3480 Encounter for supervision of other normal pregnancy, unspecified trimester: Secondary | ICD-10-CM

## 2017-07-14 DIAGNOSIS — O99891 Other specified diseases and conditions complicating pregnancy: Secondary | ICD-10-CM

## 2017-07-14 DIAGNOSIS — Z8659 Personal history of other mental and behavioral disorders: Secondary | ICD-10-CM

## 2017-07-14 NOTE — Progress Notes (Signed)
Routine Prenatal Care Visit  Subjective  Denise Walker is a 27 y.o. G3P1011 at Unknown being seen today for ongoing prenatal care.  She is currently monitored for the following issues for this high-risk pregnancy and has S/P cesarean section and Pregnancy, location unknown on their problem list.  ----------------------------------------------------------------------------------- Patient reports no complaints.    .  .   . Denies leaking of fluid.  ----------------------------------------------------------------------------------- The following portions of the patient's history were reviewed and updated as appropriate: allergies, current medications, past family history, past medical history, past social history, past surgical history and problem list. Problem list updated.   Objective  Blood pressure 120/80, weight 184 lb (83.5 kg). Pregravid weight No episode found Total Weight Gain Not found. Urinalysis:      Fetal Status:           General:  Alert, oriented and cooperative. Patient is in no acute distress.  Skin: Skin is warm and dry. No rash noted.   Cardiovascular: Normal heart rate noted  Respiratory: Normal respiratory effort, no problems with respiration noted  Abdomen: Soft, gravid, appropriate for gestational age.       Pelvic:  Cervical exam deferred        Extremities: Normal range of motion.     ental Status: Normal mood and affect. Normal behavior. Normal judgment and thought content.   US Ob Comp Less 14 Wks  Result Date: 07/04/2017 Patient Name: Denise Walker DOB: Dec 20, 1990 MRN: [id number] ULTRASOUND REPORT Location: [facility] Date of Service: 07/03/2017 Indications:dating Findings: Mason Jim intrauterine pregnancy is visualized with No fetal pole nor FHR. Yolk sac is visualized and appears normal Amnion: not visualized Right Ovary is normal in appearance. = 3.39x2.6cm Left Ovary is not normal appearance. = 4.13x2.96cm with a single solid, rounded mass in it =  2.41x1.62cm Corpus luteal cyst:  is not visualized Survey of the adnexa demonstrates no adnexal masses. There is no free peritoneal fluid in the cul de sac. Impression: 1. Singleton intrauterine pregnancy is visualized with No fetal pole nor FHR. 2.Left Ovary is not normal appearance. = 4.13x2.96cm with a single solid, rounded mass in it = 2.41x1.62cm Recommendations: 1.Clinical correlation with the patient's History and Physical Exam. Abeer Alsammarraie RDMS There is a singleton gestation of uncertain viability, to early to establish dating. A discernable heartbeat should be seen 2 week after initial ultrasound showing a gestational sac without a yolk sac"Society of Radiologyst in Ultrasound Guidelines for Transvaginal Ultrasonographic Diagnosis of Early Pregnancy Loss" and adopted in ACOG Practice Bulletin Number 150, May 2015 (reaffirmed 2017) "Early Pregnancy Loss" Vena [place], MD, [facility], [facility] 07/04/2017, 9:10 AM   US Ob Less Than 14 Weeks With Ob Transvaginal  Result Date: 07/15/2017 ULTRASOUND REPORT Patient Name: Denise Walker DOB: 11-Dec-1990 MRN: [id number] Location: [facility] Date of Service: 07/14/2017 Indications:viability Findings: Mason Jim intrauterine pregnancy is visualized with a CRL consistent with [redacted]w[redacted]d gestation, giving an (U/S) EDD of 03/04/2017. The (U/S) EDD is consistent with the clinically established EDD of 03/01/2017. FHR: 136 bpm CRL measurement: 8.54mm mm Yolk sac is visualized and appears normal and early anatomy is normal. Amnion: visualized and appears normal Right Ovary is normal in appearance. Left Ovary is not visualized Corpus luteal cyst:  is not visualized Survey of the adnexa demonstrates no adnexal masses. There is no free peritoneal fluid in the cul de sac. Impression: 1. [redacted]w[redacted]d Viable Singleton Intrauterine pregnancy by U/S. 2. (U/S) EDD is consistent with Clinically established EDD of 03/01/2017.  Recommendations: 1.Clinical correlation  with the patient's History and Physical Exam. Willette AlmaKristen Priestley, RDMS, RVT There is a viable singleton gestation.  The fetal biometry correlates with established dating. Detailed evaluation of the fetal anatomy is precluded by early gestational age.  It must be noted that a normal ultrasound particular at this early gestational age is unable to rule out fetal aneuploidy, risk of first trimester miscarriage, or anatomic birth defects. Vena AustriaAndreas Synda Bagent, MD, Evern CoreFACOG [facility], [facility] 07/15/2017, 9:57 AM     Assessment   26 y.o. G3P1011 at Unknown by  Not found. presenting for routine prenatal visit  Plan   Gestational age appropriate obstetric precautions including but not limited to vaginal bleeding, contractions, leaking of fluid and fetal movement were reviewed in detail with the patient.   - viable IUP S=D on ultrasound today  Return in about 1 week (around 07/21/2017) for ROB.  Vena AustriaAndreas Joanny Dupree, MD, Evern CoreFACOG [facility], [facility] 07/17/2017, 1:41 PM

## 2017-07-17 DIAGNOSIS — O0990 Supervision of high risk pregnancy, unspecified, unspecified trimester: Secondary | ICD-10-CM | POA: Insufficient documentation

## 2017-07-17 MED ORDER — DOXYLAMINE-PYRIDOXINE ER 20-20 MG PO TBCR: 1.0000 | EXTENDED_RELEASE_TABLET | Freq: Two times a day (BID) | ORAL | 0 refills | Status: DC

## 2017-07-22 ENCOUNTER — Ambulatory Visit (INDEPENDENT_AMBULATORY_CARE_PROVIDER_SITE_OTHER): Payer: BLUE CROSS/BLUE SHIELD | Admitting: Obstetrics and Gynecology

## 2017-07-22 ENCOUNTER — Encounter: Payer: Self-pay | Admitting: Obstetrics and Gynecology

## 2017-07-22 VITALS — BP 102/68 | Wt 182.0 lb

## 2017-07-22 DIAGNOSIS — Z113 Encounter for screening for infections with a predominantly sexual mode of transmission: Secondary | ICD-10-CM

## 2017-07-22 DIAGNOSIS — Z8659 Personal history of other mental and behavioral disorders: Secondary | ICD-10-CM

## 2017-07-22 DIAGNOSIS — Z8759 Personal history of other complications of pregnancy, childbirth and the puerperium: Secondary | ICD-10-CM

## 2017-07-22 DIAGNOSIS — Z98891 History of uterine scar from previous surgery: Secondary | ICD-10-CM

## 2017-07-22 DIAGNOSIS — Z3A08 8 weeks gestation of pregnancy: Secondary | ICD-10-CM | POA: Diagnosis not present

## 2017-07-22 DIAGNOSIS — O9989 Other specified diseases and conditions complicating pregnancy, childbirth and the puerperium: Secondary | ICD-10-CM

## 2017-07-22 DIAGNOSIS — O99891 Other specified diseases and conditions complicating pregnancy: Secondary | ICD-10-CM

## 2017-07-22 DIAGNOSIS — Z3480 Encounter for supervision of other normal pregnancy, unspecified trimester: Secondary | ICD-10-CM | POA: Diagnosis not present

## 2017-07-22 DIAGNOSIS — Z3481 Encounter for supervision of other normal pregnancy, first trimester: Secondary | ICD-10-CM

## 2017-07-22 HISTORY — DX: Personal history of other complications of pregnancy, childbirth and the puerperium: Z87.59

## 2017-07-22 NOTE — Progress Notes (Signed)
NOB 

## 2017-07-22 NOTE — Progress Notes (Signed)
New Obstetric Patient H&P    Chief Complaint: "Desires prenatal care"   History of Present Illness: Patient is a 27 y.o. [id number] Not Hispanic or Latino female, presents with amenorrhea and positive home pregnancy test. Patient's last menstrual period was 05/25/2017 (exact date). and based on her  LMP, her EDD is Estimated Date of Delivery: 03/01/18 and her EGA is [redacted]w[redacted]d   Reports some continued nausea.  She denies vaginal bleeding. Her past medical history is contibutory, history infertility letrozole pregnancy. Her prior pregnancies are notable for eclampsia, postpartum depression  Since her LMP, she admits to the use of tobacco products  no  There are cats in the home in the home  no She admits close contact with children on a regular basis  yes  She has had chicken pox in the past no She has had Tuberculosis exposures, symptoms, or previously tested positive for TB   no Current or past history of domestic violence. no  Genetic Screening/Teratology Counseling: (Includes patient, baby's father, or anyone in either family with:)   1. Patient's age >/= 50 at [facility]  no 2. Thalassemia ([place], [place], Mediterranean, or Asian background): MCV<80  no 3. Neural tube defect (meningomyelocele, spina bifida, anencephaly)  no 4. Congenital heart defect  no  5. Down syndrome  no 6. Tay-Sachs (Jewish, [place]  no 7. Canavan's Disease  no 8. Sickle cell disease or trait (African)  no  9. Hemophilia or other blood disorders  no  10. Muscular dystrophy  no  11. Cystic fibrosis  no  12. Huntington's Chorea  no  13. Mental retardation/autism  no 14. Other inherited genetic or chromosomal disorder  no 15. Maternal metabolic disorder (DM, PKU, etc)  no 16. Patient or FOB with a child with a birth defect not listed above no  16a. Patient or FOB with a birth defect themselves no 17. Recurrent pregnancy loss, or stillbirth  no  18. Any medications since LMP other than prenatal vitamins  (include vitamins, supplements, OTC meds, drugs, alcohol)  no 19. Any other genetic/environmental exposure to discuss  no  Infection History:   1. Lives with someone with TB or TB exposed  no  2. Patient or partner has history of genital herpes  no 3. Rash or viral illness since LMP  no 4. History of STI (GC, CT, HPV, syphilis, HIV)  no 5. History of recent travel :  no  Other pertinent information:  no     Review of Systems:10 point review of systems negative unless otherwise noted in HPI  Past Medical History:  Past Medical History:  Diagnosis Date  . Gestational diabetes   . PCOS (polycystic ovarian syndrome)     Past Surgical History:  Past Surgical History:  Procedure Laterality Date  . CESAREAN SECTION N/A 07/15/2014   Procedure: CESAREAN SECTION;  Surgeon: Nadara Mustard, MD;  Location: [facility] ORS;  Service: Obstetrics;  Laterality: N/A;  . HERNIA REPAIR  10/2013  . WISDOM TOOTH EXTRACTION      Gynecologic History: Patient's last menstrual period was 05/25/2017 (exact date).  Obstetric History: [id number]  Family History:  Family History  Problem Relation Age of Onset  . Cancer Mother   . Diabetes Paternal Grandfather     Social History:  Social History   Socioeconomic History  . Marital status: Married    Spouse name: Not on file  . Number of children: Not on file  . Years of education: Not on file  . Highest education  level: Not on file  Occupational History  . Not on file  Social Needs  . Financial resource strain: Not on file  . Food insecurity:    Worry: Not on file    Inability: Not on file  . Transportation needs:    Medical: Not on file    Non-medical: Not on file  Tobacco Use  . Smoking status: Never Smoker  . Smokeless tobacco: Never Used  Substance and Sexual Activity  . Alcohol use: No  . Drug use: No  . Sexual activity: Yes    Birth control/protection: None  Lifestyle  . Physical activity:    Days per week: 0 days    Minutes per  session: 0 min  . Stress: To some extent  Relationships  . Social connections:    Talks on phone: More than three times a week    Gets together: Three times a week    Attends religious service: Never    Active member of club or organization: No    Attends meetings of clubs or organizations: Never    Relationship status: Married  . Intimate partner violence:    Fear of current or ex partner: No    Emotionally abused: No    Physically abused: No    Forced sexual activity: No  Other Topics Concern  . Not on file  Social History Narrative  . Not on file    Allergies:  Allergies  Allergen Reactions  . Fish Allergy Shortness Of Breath    Medications: Prior to Admission medications   Medication Sig Start Date End Date Taking? Authorizing Provider  butalbital-acetaminophen-caffeine (FIORICET WITH CODEINE) 50-325-40-30 MG capsule Take by mouth.   Yes [provider]  Doxylamine-Pyridoxine ER (BONJESTA) 20-20 MG TBCR Take 1 tablet by mouth 2 (two) times daily. Sig 1 tab po  daily at bedtime, 1 tab po daily morning 07/17/17 09/15/17 Yes Vena [place], MD  escitalopram (LEXAPRO) 10 MG tablet Take 1 tablet (10 mg total) by mouth daily. 08/13/16 08/13/17 Yes Vena [place], MD  metFORMIN (GLUCOPHAGE) 500 MG tablet Take 1 tablet (500 mg total) by mouth 2 (two) times daily with a meal. 04/10/17  Yes Vena [place], MD  Prenatal MV-Min-Fe Fum-FA-DHA (PRENATAL 1 PO) Take 1 tablet by mouth daily.   Yes [provider]  Progesterone 400 MG SUPP Place 1 suppository (400 mg total) vaginally 2 (two) times daily. 07/03/17  Yes Vena [place], MD  fluticasone (FLONASE) 50 MCG/ACT nasal spray 2 sprays by Each Nare route Two (2) times a day. 06/05/16 06/05/17  [provider]    Physical Exam Vitals: Blood pressure 102/68, weight 182 lb (82.6 kg), last menstrual period 05/25/2017.  General: NAD HEENT: normocephalic, anicteric Abdomen: Soft, non-tender, non-distended.   Umbilicus without lesions.  No hepatomegaly, splenomegaly or masses palpable. No evidence of hernia  Genitourinary:  External: Normal external female genitalia.  Normal urethral meatus, normal  Bartholin's and Skene's glands.    Vagina: Normal vaginal mucosa, no evidence of prolapse.    Cervix: Grossly normal in appearance, no bleeding  Uterus:  Non-enlarged, mobile, normal contour.  No CMT  Adnexa: ovaries non-enlarged, no adnexal masses  Rectal: deferred Extremities: no edema, erythema, or tenderness Neurologic: Grossly intact Psychiatric: mood appropriate, affect full   Assessment: 27 y.o. [id number] at [redacted]w[redacted]d presenting to initiate prenatal care Problem List Items Addressed This Visit      Other   S/P cesarean section   Supervision of other normal pregnancy, antepartum - Primary  Relevant Orders   RPR+Rh+ABO+Rub Ab+Ab Scr+CB...   PapIG, CtNgTv, HPV, rfx 16/18   Urine Culture   Comprehensive metabolic panel   Protein / creatinine ratio, urine   Hemoglobin A1c   History of postpartum depression, currently pregnant   History of eclampsia   Relevant Orders   Comprehensive metabolic panel   Protein / creatinine ratio, urine    Other Visit Diagnoses    [redacted] weeks gestation of pregnancy       Relevant Orders   RPR+Rh+ABO+Rub Ab+Ab Scr+CB...   PapIG, CtNgTv, HPV, rfx 16/18   Urine Culture   Comprehensive metabolic panel   Protein / creatinine ratio, urine   Hemoglobin A1c   Screen for STD (sexually transmitted disease)       Relevant Orders   RPR+Rh+ABO+Rub Ab+Ab Scr+CB...   PapIG, CtNgTv, HPV, rfx 16/18      Plan: 1) Avoid alcoholic beverages. 2) Patient encouraged not to smoke.  3) Discontinue the use of all non-medicinal drugs and chemicals.  4) Take prenatal vitamins daily.  5) Nutrition, food safety (fish, cheese advisories, and high nitrite foods) and exercise discussed. 6) Hospital and practice style discussed with cross coverage system.  7) Genetic Screening, such  as with 1st Trimester Screening, cell free fetal DNA, AFP testing, and Ultrasound, as well as with amniocentesis and CVS as appropriate, is discussed with patient. At the conclusion of today's visit patient requested genetic testing 8) Patient is asked about travel to areas at risk for the Zika virus, and counseled to avoid travel and exposure to mosquitoes or sexual partners who may have themselves been exposed to the virus. Testing is discussed, and will be ordered as appropriate.   Vena AustriaAndreas Kuuipo Anzaldo, MD, Evern CoreFACOG [facility], [facility] 07/22/2017, 6:06 PM

## 2017-07-23 LAB — PROTEIN / CREATININE RATIO, URINE
Creatinine, Urine: 319 mg/dL
PROTEIN/CREAT RATIO: 69 mg/g{creat} (ref 0–200)
Protein, Ur: 22.1 mg/dL

## 2017-07-24 LAB — URINE CULTURE

## 2017-07-24 LAB — COMPREHENSIVE METABOLIC PANEL
ALT: 13 IU/L (ref 0–32)
AST: 16 IU/L (ref 0–40)
Albumin/Globulin Ratio: 1.7 (ref 1.2–2.2)
Albumin: 4.3 g/dL (ref 3.5–5.5)
Alkaline Phosphatase: 67 IU/L (ref 39–117)
BUN/Creatinine Ratio: 12 (ref 9–23)
BUN: 9 mg/dL (ref 6–20)
Bilirubin Total: 0.3 mg/dL (ref 0.0–1.2)
CO2: 20 mmol/L (ref 20–29)
CREATININE: 0.73 mg/dL (ref 0.57–1.00)
Calcium: 9.4 mg/dL (ref 8.7–10.2)
Chloride: 104 mmol/L (ref 96–106)
GFR calc Af Amer: 130 mL/min/{1.73_m2} (ref >59)
GFR, EST NON AFRICAN AMERICAN: 112 mL/min/{1.73_m2} (ref >59)
Globulin, Total: 2.5 g/dL (ref 1.5–4.5)
Glucose: 65 mg/dL (ref 65–99)
Potassium: 4.4 mmol/L (ref 3.5–5.2)
Sodium: 137 mmol/L (ref 134–144)
Total Protein: 6.8 g/dL (ref 6.0–8.5)

## 2017-07-24 LAB — RPR+RH+ABO+RUB AB+AB SCR+CB...
ANTIBODY SCREEN: NEGATIVE
HIV SCREEN 4TH GENERATION: NONREACTIVE
Hematocrit: 37.4 % (ref 34.0–46.6)
Hemoglobin: 12.6 g/dL (ref 11.1–15.9)
Hepatitis B Surface Ag: NEGATIVE
MCH: 29.9 pg (ref 26.6–33.0)
MCHC: 33.7 g/dL (ref 31.5–35.7)
MCV: 89 fL (ref 79–97)
PLATELETS: 292 10*3/uL (ref 150–450)
RBC: 4.22 x10E6/uL (ref 3.77–5.28)
RDW: 14 % (ref 12.3–15.4)
RPR Ser Ql: NONREACTIVE
RUBELLA: 0.97 {index} — AB (ref >0.99)
Rh Factor: NEGATIVE
VARICELLA: 302 {index} (ref >165)
WBC: 10.2 10*3/uL (ref 3.4–10.8)

## 2017-07-24 LAB — HEMOGLOBIN A1C
Est. average glucose Bld gHb Est-mCnc: 111 mg/dL
Hgb A1c MFr Bld: 5.5 % (ref 4.8–5.6)

## 2017-07-25 ENCOUNTER — Encounter (INDEPENDENT_AMBULATORY_CARE_PROVIDER_SITE_OTHER): Payer: Self-pay

## 2017-07-25 LAB — PAPIG, CTNGTV, HPV, RFX 16/18
Chlamydia, Nuc. Acid Amp: NEGATIVE
Gonococcus, Nuc. Acid Amp: NEGATIVE
HPV, high-risk: NEGATIVE
PAP SMEAR COMMENT: 0
Trich vag by NAA: NEGATIVE

## 2017-07-29 ENCOUNTER — Ambulatory Visit (INDEPENDENT_AMBULATORY_CARE_PROVIDER_SITE_OTHER): Payer: BLUE CROSS/BLUE SHIELD | Admitting: Obstetrics and Gynecology

## 2017-07-29 VITALS — BP 104/64 | Wt 183.0 lb

## 2017-07-29 DIAGNOSIS — Z3481 Encounter for supervision of other normal pregnancy, first trimester: Secondary | ICD-10-CM

## 2017-07-29 DIAGNOSIS — Z3A09 9 weeks gestation of pregnancy: Secondary | ICD-10-CM

## 2017-07-29 DIAGNOSIS — Z98891 History of uterine scar from previous surgery: Secondary | ICD-10-CM

## 2017-07-29 DIAGNOSIS — Z3480 Encounter for supervision of other normal pregnancy, unspecified trimester: Secondary | ICD-10-CM

## 2017-07-29 DIAGNOSIS — Z8659 Personal history of other mental and behavioral disorders: Secondary | ICD-10-CM

## 2017-07-29 DIAGNOSIS — Z8759 Personal history of other complications of pregnancy, childbirth and the puerperium: Secondary | ICD-10-CM

## 2017-07-29 DIAGNOSIS — O99891 Other specified diseases and conditions complicating pregnancy: Secondary | ICD-10-CM

## 2017-07-29 DIAGNOSIS — O9989 Other specified diseases and conditions complicating pregnancy, childbirth and the puerperium: Secondary | ICD-10-CM

## 2017-07-29 NOTE — Progress Notes (Signed)
ROB Spotting this weekend

## 2017-07-31 ENCOUNTER — Encounter: Payer: Self-pay | Admitting: Obstetrics and Gynecology

## 2017-08-06 ENCOUNTER — Ambulatory Visit (INDEPENDENT_AMBULATORY_CARE_PROVIDER_SITE_OTHER): Payer: BLUE CROSS/BLUE SHIELD | Admitting: Obstetrics and Gynecology

## 2017-08-06 VITALS — BP 112/66 | Wt 177.0 lb

## 2017-08-06 DIAGNOSIS — Z98891 History of uterine scar from previous surgery: Secondary | ICD-10-CM

## 2017-08-06 DIAGNOSIS — O09291 Supervision of pregnancy with other poor reproductive or obstetric history, first trimester: Secondary | ICD-10-CM

## 2017-08-06 DIAGNOSIS — O99210 Obesity complicating pregnancy, unspecified trimester: Secondary | ICD-10-CM

## 2017-08-06 DIAGNOSIS — Z3480 Encounter for supervision of other normal pregnancy, unspecified trimester: Secondary | ICD-10-CM

## 2017-08-06 DIAGNOSIS — O99211 Obesity complicating pregnancy, first trimester: Secondary | ICD-10-CM

## 2017-08-06 DIAGNOSIS — O34219 Maternal care for unspecified type scar from previous cesarean delivery: Secondary | ICD-10-CM

## 2017-08-06 DIAGNOSIS — Z8759 Personal history of other complications of pregnancy, childbirth and the puerperium: Secondary | ICD-10-CM

## 2017-08-06 DIAGNOSIS — Z3A10 10 weeks gestation of pregnancy: Secondary | ICD-10-CM

## 2017-08-06 DIAGNOSIS — O99891 Other specified diseases and conditions complicating pregnancy: Secondary | ICD-10-CM

## 2017-08-06 DIAGNOSIS — Z8659 Personal history of other mental and behavioral disorders: Secondary | ICD-10-CM

## 2017-08-06 DIAGNOSIS — O9989 Other specified diseases and conditions complicating pregnancy, childbirth and the puerperium: Secondary | ICD-10-CM

## 2017-08-06 NOTE — Progress Notes (Signed)
ROB

## 2017-08-07 NOTE — Progress Notes (Signed)
    Routine Prenatal Care Visit  Subjective  Denise Walker is a 27 y.o. G5P1031 at 9023w4d being seen today for ongoing prenatal care.  She is currently monitored for the following issues for this high-risk pregnancy and has S/P cesarean section; Supervision of other normal pregnancy, antepartum; History of postpartum depression, currently pregnant; and History of eclampsia on their problem list.  ----------------------------------------------------------------------------------- Patient reports no complaints.    . Vag. Bleeding: None.  Movement: Absent. Denies leaking of fluid.  ----------------------------------------------------------------------------------- The following portions of the patient's history were reviewed and updated as appropriate: allergies, current medications, past family history, past medical history, past social history, past surgical history and problem list. Problem list updated.   Objective  Blood pressure 112/66, weight 177 lb (80.3 kg), last menstrual period 05/25/2017. Pregravid weight 186 lb (84.4 kg) Total Weight Gain -9 lb (-4.082 kg) Urinalysis:      Fetal Status: Fetal Heart Rate (bpm): 160   Movement: Absent     General:  Alert, oriented and cooperative. Patient is in no acute distress.  Skin: Skin is warm and dry. No rash noted.   Cardiovascular: Normal heart rate noted  Respiratory: Normal respiratory effort, no problems with respiration noted  Abdomen: Soft, gravid, appropriate for gestational age.       Pelvic:  Cervical exam deferred        Extremities: Normal range of motion.     ental Status: Normal mood and affect. Normal behavior. Normal judgment and thought content.     Assessment   27 y.o. [id number] at 4123w4d by  03/01/2018, by Last Menstrual Period presenting for routine prenatal visit  Plan   Pregnacy#5 Problems (from 05/25/17 to present)    Problem Noted Resolved   History of eclampsia 07/22/2017 by Vena AustriaStaebler, Dillyn Joaquin, MD No        Gestational age appropriate obstetric precautions including but not limited to vaginal bleeding, contractions, leaking of fluid and fetal movement were reviewed in detail with the patient.    Return in about 2 weeks (around 08/20/2017) for ROB and 1-hr glucose.  Vena AustriaAndreas Demetrios Byron, MD, Merlinda FrederickFACOG [facility], [facility]

## 2017-08-08 DIAGNOSIS — Z6838 Body mass index (BMI) 38.0-38.9, adult: Secondary | ICD-10-CM | POA: Diagnosis not present

## 2017-08-08 DIAGNOSIS — J310 Chronic rhinitis: Secondary | ICD-10-CM | POA: Diagnosis not present

## 2017-08-18 ENCOUNTER — Other Ambulatory Visit: Payer: BLUE CROSS/BLUE SHIELD

## 2017-08-18 ENCOUNTER — Ambulatory Visit (INDEPENDENT_AMBULATORY_CARE_PROVIDER_SITE_OTHER): Payer: BLUE CROSS/BLUE SHIELD | Admitting: Obstetrics and Gynecology

## 2017-08-18 VITALS — BP 104/62 | Wt 180.0 lb

## 2017-08-18 DIAGNOSIS — Z3A12 12 weeks gestation of pregnancy: Secondary | ICD-10-CM | POA: Diagnosis not present

## 2017-08-18 DIAGNOSIS — Z3480 Encounter for supervision of other normal pregnancy, unspecified trimester: Secondary | ICD-10-CM

## 2017-08-18 DIAGNOSIS — O9989 Other specified diseases and conditions complicating pregnancy, childbirth and the puerperium: Secondary | ICD-10-CM

## 2017-08-18 DIAGNOSIS — Z8659 Personal history of other mental and behavioral disorders: Secondary | ICD-10-CM

## 2017-08-18 DIAGNOSIS — Z8759 Personal history of other complications of pregnancy, childbirth and the puerperium: Secondary | ICD-10-CM | POA: Diagnosis not present

## 2017-08-18 DIAGNOSIS — O99210 Obesity complicating pregnancy, unspecified trimester: Secondary | ICD-10-CM

## 2017-08-18 DIAGNOSIS — Z31438 Encounter for other genetic testing of female for procreative management: Secondary | ICD-10-CM | POA: Diagnosis not present

## 2017-08-18 DIAGNOSIS — Z1379 Encounter for other screening for genetic and chromosomal anomalies: Secondary | ICD-10-CM

## 2017-08-18 DIAGNOSIS — Z98891 History of uterine scar from previous surgery: Secondary | ICD-10-CM

## 2017-08-18 DIAGNOSIS — O99891 Other specified diseases and conditions complicating pregnancy: Secondary | ICD-10-CM

## 2017-08-18 NOTE — Progress Notes (Signed)
ROB Early GTT 

## 2017-08-18 NOTE — Progress Notes (Signed)
    Routine Prenatal Care Visit  Subjective  Denise Walker is a 27 y.o. G5P1031 at 5447w1d being seen today for ongoing prenatal care.  She is currently monitored for the following issues for this high-risk pregnancy and has S/P cesarean section; Supervision of other normal pregnancy, antepartum; History of postpartum depression, currently pregnant; and History of eclampsia on their problem list.  ----------------------------------------------------------------------------------- Patient reports no complaints.    . Vag. Bleeding: None.  Movement: Absent. Denies leaking of fluid.  ----------------------------------------------------------------------------------- The following portions of the patient's history were reviewed and updated as appropriate: allergies, current medications, past family history, past medical history, past social history, past surgical history and problem list. Problem list updated.   Objective  Blood pressure 104/62, weight 180 lb (81.6 kg), last menstrual period 05/25/2017. Pregravid weight 186 lb (84.4 kg) Total Weight Gain -6 lb (-2.722 kg) Urinalysis: Urine Protein: Negative Urine Glucose: Negative  Fetal Status: Fetal Heart Rate (bpm): 140   Movement: Absent     General:  Alert, oriented and cooperative. Patient is in no acute distress.  Skin: Skin is warm and dry. No rash noted.   Cardiovascular: Normal heart rate noted  Respiratory: Normal respiratory effort, no problems with respiration noted  Abdomen: Soft, gravid, appropriate for gestational age.       Pelvic:  Cervical exam deferred        Extremities: Normal range of motion.     ental Status: Normal mood and affect. Normal behavior. Normal judgment and thought content.   FHT 140, with CRL c/w 6241w2d on bedside ultrasound  Assessment   26 y.o. [id number] at 9747w1d by  03/01/2018, by Last Menstrual Period presenting for routine prenatal visit  Plan   Pregnacy#5 Problems (from 05/25/17 to present)    Problem Noted Resolved   History of eclampsia 07/22/2017 by Vena AustriaStaebler, Velera Lansdale, MD No       Gestational age appropriate obstetric precautions including but not limited to vaginal bleeding, contractions, leaking of fluid and fetal movement were reviewed in detail with the patient. - inheritest and maternity 21 today - early 1-hr glucose test today - discussed ASA starting next week  - NT screen next week    Return in about 1 week (around 08/25/2017) for ROB and 1st Trimester ultrasound.  Vena AustriaAndreas Emmalynne Courtney, MD, Evern CoreFACOG [facility], [facility] 08/18/2017, 9:18 AM

## 2017-08-19 LAB — GLUCOSE TOLERANCE, 1 HOUR: GLUCOSE, 1HR PP: 158 mg/dL (ref 65–199)

## 2017-08-22 ENCOUNTER — Encounter: Payer: Self-pay | Admitting: Obstetrics and Gynecology

## 2017-08-23 ENCOUNTER — Encounter: Payer: Self-pay | Admitting: Obstetrics and Gynecology

## 2017-08-23 ENCOUNTER — Other Ambulatory Visit: Payer: Self-pay | Admitting: Obstetrics and Gynecology

## 2017-08-23 DIAGNOSIS — Z8632 Personal history of gestational diabetes: Secondary | ICD-10-CM

## 2017-08-23 DIAGNOSIS — O34219 Maternal care for unspecified type scar from previous cesarean delivery: Secondary | ICD-10-CM | POA: Insufficient documentation

## 2017-08-23 DIAGNOSIS — O99810 Abnormal glucose complicating pregnancy: Secondary | ICD-10-CM

## 2017-08-23 DIAGNOSIS — Z6791 Unspecified blood type, Rh negative: Secondary | ICD-10-CM | POA: Insufficient documentation

## 2017-08-23 DIAGNOSIS — O26899 Other specified pregnancy related conditions, unspecified trimester: Secondary | ICD-10-CM | POA: Insufficient documentation

## 2017-08-23 DIAGNOSIS — O09299 Supervision of pregnancy with other poor reproductive or obstetric history, unspecified trimester: Secondary | ICD-10-CM | POA: Insufficient documentation

## 2017-08-23 DIAGNOSIS — Z3480 Encounter for supervision of other normal pregnancy, unspecified trimester: Secondary | ICD-10-CM

## 2017-08-23 HISTORY — DX: Other specified pregnancy related conditions, unspecified trimester: Z67.91

## 2017-08-23 LAB — MATERNIT 21 PLUS CORE, BLOOD
CHROMOSOME 21: NEGATIVE
Chromosome 13: NEGATIVE
Chromosome 18: NEGATIVE
Y Chromosome: NOT DETECTED

## 2017-08-23 NOTE — Progress Notes (Signed)
Can we print out the genetics results and have them in an envelope for patient to pick up Monday

## 2017-08-24 ENCOUNTER — Encounter: Payer: Self-pay | Admitting: Obstetrics and Gynecology

## 2017-08-25 ENCOUNTER — Telehealth: Payer: Self-pay | Admitting: Obstetrics and Gynecology

## 2017-08-25 NOTE — Progress Notes (Signed)
Printed and placed at front desk. Pt aware

## 2017-08-25 NOTE — Telephone Encounter (Signed)
Patient is schedule 08/29/17 °

## 2017-08-25 NOTE — Telephone Encounter (Signed)
-----   Message from Vena AustriaAndreas Staebler, MD sent at 08/23/2017  8:37 PM EDT ----- Regarding: 3-hr OGTT Needs 3-hr OGTT next week order in patient aware of results

## 2017-08-26 ENCOUNTER — Other Ambulatory Visit: Payer: BLUE CROSS/BLUE SHIELD

## 2017-08-26 ENCOUNTER — Encounter (INDEPENDENT_AMBULATORY_CARE_PROVIDER_SITE_OTHER): Payer: Self-pay

## 2017-08-26 ENCOUNTER — Encounter: Payer: BLUE CROSS/BLUE SHIELD | Admitting: Obstetrics and Gynecology

## 2017-08-26 ENCOUNTER — Ambulatory Visit (INDEPENDENT_AMBULATORY_CARE_PROVIDER_SITE_OTHER): Payer: BLUE CROSS/BLUE SHIELD

## 2017-08-26 DIAGNOSIS — Z1379 Encounter for other screening for genetic and chromosomal anomalies: Secondary | ICD-10-CM | POA: Diagnosis not present

## 2017-08-26 DIAGNOSIS — Z3A13 13 weeks gestation of pregnancy: Secondary | ICD-10-CM | POA: Diagnosis not present

## 2017-08-27 ENCOUNTER — Encounter (INDEPENDENT_AMBULATORY_CARE_PROVIDER_SITE_OTHER): Payer: Self-pay

## 2017-08-27 LAB — INHERITEST CORE(CF97,SMA,FRAX)

## 2017-08-29 ENCOUNTER — Other Ambulatory Visit: Payer: BLUE CROSS/BLUE SHIELD

## 2017-08-29 ENCOUNTER — Ambulatory Visit (INDEPENDENT_AMBULATORY_CARE_PROVIDER_SITE_OTHER): Payer: BLUE CROSS/BLUE SHIELD | Admitting: Obstetrics and Gynecology

## 2017-08-29 VITALS — BP 118/74 | Wt 180.0 lb

## 2017-08-29 DIAGNOSIS — O34219 Maternal care for unspecified type scar from previous cesarean delivery: Secondary | ICD-10-CM

## 2017-08-29 DIAGNOSIS — Z8632 Personal history of gestational diabetes: Secondary | ICD-10-CM

## 2017-08-29 DIAGNOSIS — O09299 Supervision of pregnancy with other poor reproductive or obstetric history, unspecified trimester: Secondary | ICD-10-CM

## 2017-08-29 DIAGNOSIS — Z3A13 13 weeks gestation of pregnancy: Secondary | ICD-10-CM

## 2017-08-29 DIAGNOSIS — O99891 Other specified diseases and conditions complicating pregnancy: Secondary | ICD-10-CM

## 2017-08-29 DIAGNOSIS — Z3480 Encounter for supervision of other normal pregnancy, unspecified trimester: Secondary | ICD-10-CM

## 2017-08-29 DIAGNOSIS — O26899 Other specified pregnancy related conditions, unspecified trimester: Secondary | ICD-10-CM

## 2017-08-29 DIAGNOSIS — Z6791 Unspecified blood type, Rh negative: Secondary | ICD-10-CM

## 2017-08-29 DIAGNOSIS — Z8659 Personal history of other mental and behavioral disorders: Secondary | ICD-10-CM

## 2017-08-29 DIAGNOSIS — O09291 Supervision of pregnancy with other poor reproductive or obstetric history, first trimester: Secondary | ICD-10-CM

## 2017-08-29 DIAGNOSIS — O360110 Maternal care for anti-D [Rh] antibodies, first trimester, not applicable or unspecified: Secondary | ICD-10-CM

## 2017-08-29 DIAGNOSIS — O9989 Other specified diseases and conditions complicating pregnancy, childbirth and the puerperium: Secondary | ICD-10-CM

## 2017-08-29 DIAGNOSIS — Z8759 Personal history of other complications of pregnancy, childbirth and the puerperium: Secondary | ICD-10-CM

## 2017-08-29 NOTE — Progress Notes (Signed)
    Routine Prenatal Care Visit  Subjective  Delmar LandauKelley Njie is a 27 y.o. G5P1031 at 3454w5d being seen today for ongoing prenatal care.  She is currently monitored for the following issues for this high-risk pregnancy and has Supervision of other normal pregnancy, antepartum; History of postpartum depression, currently pregnant; History of eclampsia; Rh negative state in antepartum period; Pregnancy with history of cesarean section, antepartum; and History of gestational diabetes in prior pregnancy, currently pregnant on their problem list.  ----------------------------------------------------------------------------------- Patient reports no complaints.    . Vag. Bleeding: None.  Movement: Absent. Denies leaking of fluid.  ----------------------------------------------------------------------------------- The following portions of the patient's history were reviewed and updated as appropriate: allergies, current medications, past family history, past medical history, past social history, past surgical history and problem list. Problem list updated.   Objective  Blood pressure 118/74, weight 180 lb (81.6 kg), last menstrual period 05/25/2017. Pregravid weight 186 lb (84.4 kg) Total Weight Gain -6 lb (-2.722 kg) Urinalysis: Urine Protein: Negative Urine Glucose: Negative  Fetal Status: Fetal Heart Rate (bpm): 150   Movement: Absent     General:  Alert, oriented and cooperative. Patient is in no acute distress.  Skin: Skin is warm and dry. No rash noted.   Cardiovascular: Normal heart rate noted  Respiratory: Normal respiratory effort, no problems with respiration noted  Abdomen: Soft, gravid, appropriate for gestational age.       Pelvic:  Cervical exam deferred        Extremities: Normal range of motion.     ental Status: Normal mood and affect. Normal behavior. Normal judgment and thought content.     Assessment   27 y.o. [id number] at 2854w5d by  03/01/2018, by Last Menstrual Period  presenting for routine prenatal visit  Plan   Pregnacy#5 Problems (from 05/25/17 to present)    Problem Noted Resolved   History of eclampsia 07/22/2017 by Vena AustriaStaebler, Shatia Sindoni, MD No   Priority:  Medium     Overview Signed 08/23/2017  8:32 PM by Vena AustriaStaebler, Keyona Emrich, MD    [ ]  Aspirin 81 mg daily after 12 weeks; discontinue after 36 weeks  Baseline and surveillance [facility] refresh links as needed)  Lab Results  Component Value Date   PLT 292 07/22/2017   CREATININE 0.73 07/22/2017   AST 16 07/22/2017   ALT 13 07/22/2017   PROTCRRATIO 69 07/16/2014           Rh negative state in antepartum period 08/23/2017 by Vena AustriaStaebler, Cesia Orf, MD No   Overview Signed 08/23/2017  8:29 PM by Vena AustriaStaebler, Terrionna Bridwell, MD    [ ]  Rhogam 28 weeks      Pregnancy with history of cesarean section, antepartum 08/23/2017 by Vena AustriaStaebler, Winslow Ederer, MD No   History of gestational diabetes in prior pregnancy, currently pregnant 08/23/2017 by Vena AustriaStaebler, Khamari Yousuf, MD No   History of postpartum depression, currently pregnant 07/17/2017 by Vena AustriaStaebler, Tessy Pawelski, MD No       Gestational age appropriate obstetric precautions including but not limited to vaginal bleeding, contractions, leaking of fluid and fetal movement were reviewed in detail with the patient.    - 3-HR next week, hold metformin in the next week to assure accurate results on 3-hr  Return in about 1 week (around 09/05/2017) for rob-Ezio Wieck.  Vena AustriaAndreas Shella Lahman, MD, Evern CoreFACOG [facility], [facility] 08/29/2017, 12:05 PM

## 2017-08-29 NOTE — Progress Notes (Signed)
ROB

## 2017-09-03 ENCOUNTER — Ambulatory Visit (INDEPENDENT_AMBULATORY_CARE_PROVIDER_SITE_OTHER): Payer: BLUE CROSS/BLUE SHIELD | Admitting: Obstetrics and Gynecology

## 2017-09-03 VITALS — BP 112/78 | Wt 182.0 lb

## 2017-09-03 DIAGNOSIS — Z6791 Unspecified blood type, Rh negative: Secondary | ICD-10-CM

## 2017-09-03 DIAGNOSIS — Z8759 Personal history of other complications of pregnancy, childbirth and the puerperium: Secondary | ICD-10-CM

## 2017-09-03 DIAGNOSIS — Z3480 Encounter for supervision of other normal pregnancy, unspecified trimester: Secondary | ICD-10-CM

## 2017-09-03 DIAGNOSIS — O99891 Other specified diseases and conditions complicating pregnancy: Secondary | ICD-10-CM

## 2017-09-03 DIAGNOSIS — O9989 Other specified diseases and conditions complicating pregnancy, childbirth and the puerperium: Secondary | ICD-10-CM

## 2017-09-03 DIAGNOSIS — Z3A14 14 weeks gestation of pregnancy: Secondary | ICD-10-CM

## 2017-09-03 DIAGNOSIS — O26899 Other specified pregnancy related conditions, unspecified trimester: Secondary | ICD-10-CM

## 2017-09-03 DIAGNOSIS — O09299 Supervision of pregnancy with other poor reproductive or obstetric history, unspecified trimester: Secondary | ICD-10-CM

## 2017-09-03 DIAGNOSIS — O09292 Supervision of pregnancy with other poor reproductive or obstetric history, second trimester: Secondary | ICD-10-CM

## 2017-09-03 DIAGNOSIS — O360120 Maternal care for anti-D [Rh] antibodies, second trimester, not applicable or unspecified: Secondary | ICD-10-CM

## 2017-09-03 DIAGNOSIS — Z8659 Personal history of other mental and behavioral disorders: Secondary | ICD-10-CM

## 2017-09-03 DIAGNOSIS — O34219 Maternal care for unspecified type scar from previous cesarean delivery: Secondary | ICD-10-CM

## 2017-09-03 DIAGNOSIS — Z8632 Personal history of gestational diabetes: Secondary | ICD-10-CM

## 2017-09-03 NOTE — Progress Notes (Signed)
Routine Prenatal Care Visit  Subjective  Denise Walker is a 27 y.o. G5P1031 at [redacted]w[redacted]d being seen today for ongoing prenatal care.  She is currently monitored for the following issues for this high-risk pregnancy and has Supervision of other normal pregnancy, antepartum; History of postpartum depression, currently pregnant; History of eclampsia; Rh negative state in antepartum period; Pregnancy with history of cesarean section, antepartum; and History of gestational diabetes in prior pregnancy, currently pregnant on their problem list.  ----------------------------------------------------------------------------------- Patient reports no complaints.   Contractions: Not present. Vag. Bleeding: None.  Movement: Absent. Denies leaking of fluid.  ----------------------------------------------------------------------------------- The following portions of the patient's history were reviewed and updated as appropriate: allergies, current medications, past family history, past medical history, past social history, past surgical history and problem list. Problem list updated.   Objective  Blood pressure 112/78, weight 182 lb (82.6 kg), last menstrual period 05/25/2017. Pregravid weight 186 lb (84.4 kg) Total Weight Gain -4 lb (-1.814 kg) Urinalysis:      Fetal Status: Fetal Heart Rate (bpm): 145   Movement: Absent     General:  Alert, oriented and cooperative. Patient is in no acute distress.  Skin: Skin is warm and dry. No rash noted.   Cardiovascular: Normal heart rate noted  Respiratory: Normal respiratory effort, no problems with respiration noted  Abdomen: Soft, gravid, appropriate for gestational age. Pain/Pressure: Absent     Pelvic:  Cervical exam deferred        Extremities: Normal range of motion.     ental Status: Normal mood and affect. Normal behavior. Normal judgment and thought content.     Assessment   27 y.o. N5A2130 at [redacted]w[redacted]d by  03/01/2018, by Last Menstrual Period  presenting for routine prenatal visit  Pregnacy#5 Problems (from 05/25/17 to present)    Problem Noted Resolved   Supervision of other normal pregnancy, antepartum 07/17/2017 by Vena Austria, MD No   Priority:  High     Overview Addendum 09/03/2017  5:45 PM by Vena Austria, MD    [facility] Prenatal Labs  Dating LMP = [redacted]w[redacted]d [place] Blood type: O negative  Genetic Screen NIPS: normal XX SMA neg, CF neg, FragileX neg Antibody:negative  Anatomic [place]  Rubella: Equivocal Varicella: Immune  GTT (GDM G1) Early: HgbA1C 5.5, 158         Third trimester:  RPR: NR  Rhogam [ ]  28 weeks HBsAg: negative  TDaP vaccine                       Flu Shot: HIV: negative  Baby Food                                GBS:   Contraception  Pap: 07/22/17 NIL  CBB     CS/VBAC undecided   Support Person Cody           History of eclampsia 07/22/2017 by Vena Austria, MD No   Priority:  Medium     Overview Signed 08/23/2017  8:32 PM by Vena Austria, MD    [X]  Aspirin 81 mg daily after 12 weeks; discontinue after 36 weeks  Baseline and surveillance labs (pulled in from [facility], refresh links as needed)  Lab Results  Component Value Date   PLT 292 07/22/2017   CREATININE 0.73 07/22/2017   AST 16 07/22/2017   ALT 13 07/22/2017   PROTCRRATIO 69 07/16/2014  Rh negative state in antepartum period 08/23/2017 by Vena AustriaStaebler, Oday Ridings, MD No   Overview Signed 08/23/2017  8:29 PM by Vena AustriaStaebler, Cyra Spader, MD    [ ]  Rhogam 28 weeks      Pregnancy with history of cesarean section, antepartum 08/23/2017 by Vena AustriaStaebler, Jaston Havens, MD No   History of gestational diabetes in prior pregnancy, currently pregnant 08/23/2017 by Vena AustriaStaebler, Lounette Sloan, MD No   History of postpartum depression, currently pregnant 07/17/2017 by Vena AustriaStaebler, Shalena Ezzell, MD No       Gestational age appropriate obstetric precautions including but not limited to vaginal bleeding, contractions, leaking of fluid and fetal movement were reviewed in  detail with the patient.    Return in about 1 week (around 09/10/2017) for ROB.    Vena AustriaAndreas Yashar Inclan, MD, Evern CoreFACOG [facility], [facility] 09/03/2017, 5:46 PM

## 2017-09-03 NOTE — Progress Notes (Signed)
ROB

## 2017-09-05 ENCOUNTER — Other Ambulatory Visit: Payer: BLUE CROSS/BLUE SHIELD

## 2017-09-05 ENCOUNTER — Other Ambulatory Visit: Payer: Self-pay | Admitting: Obstetrics and Gynecology

## 2017-09-05 DIAGNOSIS — Z3480 Encounter for supervision of other normal pregnancy, unspecified trimester: Secondary | ICD-10-CM | POA: Diagnosis not present

## 2017-09-05 DIAGNOSIS — Z8632 Personal history of gestational diabetes: Secondary | ICD-10-CM

## 2017-09-05 DIAGNOSIS — O09299 Supervision of pregnancy with other poor reproductive or obstetric history, unspecified trimester: Secondary | ICD-10-CM | POA: Diagnosis not present

## 2017-09-05 DIAGNOSIS — O99810 Abnormal glucose complicating pregnancy: Secondary | ICD-10-CM | POA: Diagnosis not present

## 2017-09-06 LAB — GESTATIONAL GLUCOSE TOLERANCE
GLUCOSE 2 HOUR GTT: 131 mg/dL (ref 65–154)
GLUCOSE 3 HOUR GTT: 73 mg/dL (ref 65–139)
Glucose, Fasting: 99 mg/dL — ABNORMAL HIGH (ref 65–94)
Glucose, GTT - 1 Hour: 181 mg/dL — ABNORMAL HIGH (ref 65–179)

## 2017-09-08 ENCOUNTER — Other Ambulatory Visit: Payer: Self-pay | Admitting: Obstetrics and Gynecology

## 2017-09-08 ENCOUNTER — Encounter: Payer: Self-pay | Admitting: Obstetrics and Gynecology

## 2017-09-08 DIAGNOSIS — O24419 Gestational diabetes mellitus in pregnancy, unspecified control: Secondary | ICD-10-CM

## 2017-09-08 DIAGNOSIS — O24414 Gestational diabetes mellitus in pregnancy, insulin controlled: Secondary | ICD-10-CM | POA: Insufficient documentation

## 2017-09-08 MED ORDER — GLUCOSE BLOOD VI STRP: ORAL_STRIP | 12 refills | Status: DC

## 2017-09-08 MED ORDER — ACCU-CHEK NANO SMARTVIEW W/DEVICE KIT: 1.0000 | PACK | 0 refills | Status: DC

## 2017-09-08 MED ORDER — ACCU-CHEK FASTCLIX LANCETS MISC: 1.0000 [IU] | Freq: Four times a day (QID) | 12 refills | Status: DC

## 2017-09-11 ENCOUNTER — Ambulatory Visit (INDEPENDENT_AMBULATORY_CARE_PROVIDER_SITE_OTHER): Payer: BLUE CROSS/BLUE SHIELD | Admitting: Obstetrics and Gynecology

## 2017-09-11 VITALS — BP 124/76 | Wt 180.0 lb

## 2017-09-11 DIAGNOSIS — Z6791 Unspecified blood type, Rh negative: Secondary | ICD-10-CM

## 2017-09-11 DIAGNOSIS — Z3A15 15 weeks gestation of pregnancy: Secondary | ICD-10-CM

## 2017-09-11 DIAGNOSIS — O99891 Other specified diseases and conditions complicating pregnancy: Secondary | ICD-10-CM

## 2017-09-11 DIAGNOSIS — Z8659 Personal history of other mental and behavioral disorders: Secondary | ICD-10-CM

## 2017-09-11 DIAGNOSIS — Z8759 Personal history of other complications of pregnancy, childbirth and the puerperium: Secondary | ICD-10-CM

## 2017-09-11 DIAGNOSIS — Z3480 Encounter for supervision of other normal pregnancy, unspecified trimester: Secondary | ICD-10-CM

## 2017-09-11 DIAGNOSIS — O26899 Other specified pregnancy related conditions, unspecified trimester: Secondary | ICD-10-CM

## 2017-09-11 DIAGNOSIS — O34219 Maternal care for unspecified type scar from previous cesarean delivery: Secondary | ICD-10-CM

## 2017-09-11 DIAGNOSIS — O24410 Gestational diabetes mellitus in pregnancy, diet controlled: Secondary | ICD-10-CM

## 2017-09-11 DIAGNOSIS — O9989 Other specified diseases and conditions complicating pregnancy, childbirth and the puerperium: Secondary | ICD-10-CM

## 2017-09-11 NOTE — Progress Notes (Signed)
ROB

## 2017-09-12 ENCOUNTER — Encounter: Payer: Self-pay | Admitting: *Deleted

## 2017-09-12 ENCOUNTER — Encounter: Payer: BLUE CROSS/BLUE SHIELD | Attending: Obstetrics and Gynecology | Admitting: *Deleted

## 2017-09-12 VITALS — BP 100/64 | Ht 59.0 in | Wt 180.3 lb

## 2017-09-12 DIAGNOSIS — O24415 Gestational diabetes mellitus in pregnancy, controlled by oral hypoglycemic drugs: Secondary | ICD-10-CM | POA: Insufficient documentation

## 2017-09-12 DIAGNOSIS — Z713 Dietary counseling and surveillance: Secondary | ICD-10-CM | POA: Diagnosis not present

## 2017-09-12 DIAGNOSIS — O24410 Gestational diabetes mellitus in pregnancy, diet controlled: Secondary | ICD-10-CM

## 2017-09-12 NOTE — Patient Instructions (Signed)
Read booklet on Gestational Diabetes Follow Gestational Meal Planning Guidelines Avoid sugar sweetened tea Complete a 3 Day Food Record and bring to next appointment Check blood sugars 4 x day - before breakfast and 2 hrs after every meal and record  Bring blood sugar log to all appointments Purchase urine ketone strips if blood sugars not controlled and check urine ketones every am:  If + increase bedtime snack to 1 protein and 2 carbohydrate servings Walk 20-30 minutes at least 5 x week if permitted by MD

## 2017-09-12 NOTE — Progress Notes (Signed)
Diabetes Self-Management Education  Visit Type: First/Initial  Appt. Start Time: 1320 Appt. End Time: 1415  09/12/2017  [name], identified by name and date of birth, is a 27 y.o. female with a diagnosis of Diabetes: Gestational Diabetes.   ASSESSMENT  Blood pressure 100/64, height _0  (1.499 m), weight 180 lb 4.8 oz (81.8 kg), last menstrual period 05/25/2017. Body mass index is 36.42 kg/m.  Diabetes Self-Management Education - 09/12/17 1459      Visit Information   Visit Type  First/Initial      Initial Visit   Diabetes Type  Gestational Diabetes    Are you currently following a meal plan?  Yes    What type of meal plan do you follow?  "starting to limit carbs"    Are you taking your medications as prescribed?  Yes    Date Diagnosed  2 weeks      Health Coping   How would you rate your overall health?  Good      Psychosocial Assessment   Patient Belief/Attitude about Diabetes  Motivated to manage diabetes "I'm not really bothered by it this time"    Self-care barriers  None    Self-management support  Doctor's office;Family    Patient Concerns  Nutrition/Meal planning;Glycemic Control;Monitoring;Healthy Lifestyle    Special Needs  None    Preferred Learning Style  Visual    Learning Readiness  Change in progress    How often do you need to have someone help you when you read instructions, pamphlets, or other written materials from your doctor or pharmacy?  1 - Never    What is the last grade level you completed in school?  high school      Pre-Education Assessment   Patient understands the diabetes disease and treatment process.  Needs Review    Patient understands incorporating nutritional management into lifestyle.  Needs Review    Patient undertands incorporating physical activity into lifestyle.  Needs Review    Patient understands using medications safely.  Needs Review    Patient understands monitoring blood glucose, interpreting and using results   Needs Review    Patient understands prevention, detection, and treatment of acute complications.  Needs Instruction    Patient understands prevention, detection, and treatment of chronic complications.  Needs Review    Patient understands how to develop strategies to address psychosocial issues.  Needs Review    Patient understands how to develop strategies to promote health/change behavior.  Needs Review      Complications   Last HgB A1C per patient/outside source  5.5 % 07/22/17    How often do you check your blood sugar?  0 times/day (not testing) She has a meter and will start testing tomorrow. BG in the office was 79 mg/dL at 2:05 pm - 5 hrs ppS.     Have you had a dilated eye exam in the past 12 months?  Yes    Have you had a dental exam in the past 12 months?  No    Are you checking your feet?  No      Dietary Intake   Breakfast  left overs from supper; sometimes eggs    Lunch  salads with romaine lettuce, spinach, cheese tomatoes, crotons    Snack (afternoon)  fruit, cheese    Dinner  chicken, pork, beef, peas, corn, beans, rice, green beans, occasional mac-n-cheese, asparagus, broccoli    Beverage(s)  water, sweet tea once daily      Exercise  Exercise Type  Light (walking / raking leaves)    How many days per week to you exercise?  60    How many minutes per day do you exercise?  5    Total minutes per week of exercise  300      Patient Education   Previous Diabetes Education  Yes (please comment) She came to GDM classes 3 years ago.     Disease state   Definition of diabetes, type 1 and 2, and the diagnosis of diabetes    Nutrition management   Role of diet in the treatment of diabetes and the relationship between the three main macronutrients and blood glucose level;Reviewed blood glucose goals for pre and post meals and how to evaluate the patients' food intake on their blood glucose level.;Carbohydrate counting    Physical activity and exercise   Role of exercise on  diabetes management, blood pressure control and cardiac health.    Medications  Reviewed patients medication for diabetes, action, purpose, timing of dose and side effects. Pt reports taking Metformin for PCOS and MD just stopped it 2 weeks ago.     Monitoring  Purpose and frequency of SMBG.;Taught/discussed recording of test results and interpretation of SMBG.;Identified appropriate SMBG and/or A1C goals.;Ketone testing, when, how.    Chronic complications  Relationship between chronic complications and blood glucose control    Psychosocial adjustment  Identified and addressed patients feelings and concerns about diabetes    Preconception care  Pregnancy and GDM  Role of pre-pregnancy blood glucose control on the development of the fetus;Role of family planning for patients with diabetes;Reviewed with patient blood glucose goals with pregnancy      Individualized Goals (developed by patient)   Reducing Risk  Improve blood sugars Prevent diabetes complications Lead a healthier lifestyle     Outcomes   Expected Outcomes  Demonstrated interest in learning. Expect positive outcomes    Future DMSE  2 wks       Individualized Plan for Diabetes Self-Management Training:   Learning Objective:  Patient will have a greater understanding of diabetes self-management. Patient education plan is to attend individual and/or group sessions per assessed needs and concerns.   Plan:   Patient Instructions  Read booklet on Gestational Diabetes Follow Gestational Meal Planning Guidelines Avoid sugar sweetened tea Complete a 3 Day Food Record and bring to next appointment Check blood sugars 4 x day - before breakfast and 2 hrs after every meal and record  Bring blood sugar log to all appointments Purchase urine ketone strips if blood sugars not controlled and check urine ketones every am:  If + increase bedtime snack to 1 protein and 2 carbohydrate servings Walk 20-30 minutes at least 5 x week if  permitted by MD  Expected Outcomes:  Demonstrated interest in learning. Expect positive outcomes  Education material provided:  Gestational Booklet Gestational Meal Planning Guidelines Simple Meal Plan 3 Day Food Record Goals for a Healthy Pregnancy  If problems or questions, patient to contact team via:  Johny Drilling, [place], [place], CDE [phone]  Future DSME appointment: 2 wks  September 25, 2017 with the dietitian

## 2017-09-19 ENCOUNTER — Ambulatory Visit (INDEPENDENT_AMBULATORY_CARE_PROVIDER_SITE_OTHER): Payer: BLUE CROSS/BLUE SHIELD | Admitting: Obstetrics and Gynecology

## 2017-09-19 VITALS — BP 116/68 | Wt 178.0 lb

## 2017-09-19 DIAGNOSIS — Z3A16 16 weeks gestation of pregnancy: Secondary | ICD-10-CM

## 2017-09-19 DIAGNOSIS — Z363 Encounter for antenatal screening for malformations: Secondary | ICD-10-CM

## 2017-09-19 LAB — POCT URINALYSIS DIPSTICK OB: Glucose, UA: NEGATIVE — AB

## 2017-09-19 MED ORDER — GLUCOSE BLOOD VI STRP
ORAL_STRIP | 12 refills | Status: DC
Start: 2017-09-19 — End: 2017-09-19

## 2017-09-19 MED ORDER — GLUCOSE BLOOD VI STRP: ORAL_STRIP | 12 refills | Status: DC

## 2017-09-19 MED ORDER — METFORMIN HCL 500 MG PO TABS: 500.0000 mg | ORAL_TABLET | Freq: Two times a day (BID) | ORAL | 6 refills | Status: DC

## 2017-09-19 NOTE — Progress Notes (Signed)
Routine Prenatal Care Visit  Subjective  Denise Walker is a 27 y.o. G5P1031 at 5024w5d being seen today for ongoing prenatal care.  She is currently monitored for the following issues for this high-risk pregnancy and has Supervision of other normal pregnancy, antepartum; History of postpartum depression, currently pregnant; History of eclampsia; Rh negative state in antepartum period; Pregnancy with history of cesarean section, antepartum; and Gestational diabetes on their problem list.  ----------------------------------------------------------------------------------- Patient reports no complaints.   Contractions: Not present. Vag. Bleeding: None.  Movement: Absent. Denies leaking of fluid.  ----------------------------------------------------------------------------------- The following portions of the patient's history were reviewed and updated as appropriate: allergies, current medications, past family history, past medical history, past social history, past surgical history and problem list. Problem list updated.   Objective  Blood pressure 116/68, weight 178 lb (80.7 kg), last menstrual period 05/25/2017. Pregravid weight 186 lb (84.4 kg) Total Weight Gain -8 lb (-3.629 kg) Urinalysis:      Fetal Status: Fetal Heart Rate (bpm): 140   Movement: Absent     General:  Alert, oriented and cooperative. Patient is in no acute distress.  Skin: Skin is warm and dry. No rash noted.   Cardiovascular: Normal heart rate noted  Respiratory: Normal respiratory effort, no problems with respiration noted  Abdomen: Soft, gravid, appropriate for gestational age. Pain/Pressure: Absent     Pelvic:  Cervical exam deferred        Extremities: Normal range of motion.     ental Status: Normal mood and affect. Normal behavior. Normal judgment and thought content.     Assessment   27 y.o. [id number] at 3224w5d by  03/01/2018, by Last Menstrual Period presenting for routine prenatal visit  Plan    Pregnacy#5 Problems (from 05/25/17 to present)    Problem Noted Resolved   Supervision of other normal pregnancy, antepartum 07/17/2017 by Vena AustriaStaebler, Hillarie Harrigan, MD No   Priority:  High     Overview Addendum 09/08/2017  1:07 PM by Vena AustriaStaebler, Jashan Cotten, MD    Clinic [facility] Prenatal Labs  Dating LMP = 3358w5d US Blood type: O negative  Genetic Screen NIPS: normal XX SMA neg, CF neg, FragileX neg Antibody:negative  Anatomic US  Rubella: Equivocal Varicella: Immune  GTT (GDM G1) Early: HgbA1C 5.5, 158 3-hr: 99 / 181 / 131 / 73 RPR: NR  Rhogam [ ]  28 weeks HBsAg: negative  TDaP vaccine                       Flu Shot: HIV: negative  Baby Food                                GBS:   Contraception  Pap: 07/22/17 NIL  CBB     CS/VBAC undecided   Support Person Cody           History of eclampsia 07/22/2017 by Vena AustriaStaebler, Ciana Simmon, MD No   Priority:  Medium     Overview Addendum 09/03/2017  5:46 PM by Vena AustriaStaebler, Fleda Pagel, MD    [X]  Aspirin 81 mg daily after 12 weeks; discontinue after 36 weeks  [facility])  Lab Results  Component Value Date   PLT 292 07/22/2017   CREATININE 0.73 07/22/2017   AST 16 07/22/2017   ALT 13 07/22/2017   PROTCRRATIO 69 07/16/2014           Gestational  diabetes 09/08/2017 by Vena Austria, MD No   Overview Signed 09/08/2017  1:06 PM by Vena Austria, MD    Current Diabetic Medications:  None (was on metformin PCOS)   [X]  Diabetes Education and Testing Supplies [X]  Nutrition Counsult [ ]  Fetal ECHO after 22-24 weeks  [ ]  Delivery planning contingent on fetal growth, AFI, glycemic control, and other co-morbidities but at least by 39 weeks  Baseline and surveillance labs (pulled in from [facility], refresh links as needed)  Lab Results  Component Value Date   CREATININE 0.73 07/22/2017   AST 16 07/22/2017   ALT 13 07/22/2017   TSH 2.170 03/27/2017   PROTCRRATIO 1.16 (H) 07/16/2014   Lab Results   Component Value Date   HGBA1C 5.5 07/22/2017    Antenatal Testing Class of DM U/S NST/AFI DELIVERY  Diabetes   A1 - good control - O24.410    A2 - good control - O24.419      A2  - poor control or poor compliance - O24.419, E11.65   (Macrosomia or polyhydramnios) **E11.65 is extra code for poor control**    A2/B - O24.919  and B-C O24.319  Poor control B-C or D-R-F-T - O24.319  or  Type I DM - O24.019  20-38  20-38  20-24-28-32-36   20-24-28-32-35-38//fetal echo  20-24-27-30-33-36-38//fetal echo  40  32//2 x wk  32//2 x wk   32//2 x wk  28//BPP wkly then 32//2 x wk  40  39  PRN   39  PRN          Rh negative state in antepartum period 08/23/2017 by Vena Austria, MD No   Overview Signed 08/23/2017  8:29 PM by Vena Austria, MD    [ ]  Rhogam 28 weeks      Pregnancy with history of cesarean section, antepartum 08/23/2017 by Vena Austria, MD No   History of postpartum depression, currently pregnant 07/17/2017 by Vena Austria, MD No   History of gestational diabetes in prior pregnancy, currently pregnant 08/23/2017 by Vena Austria, MD 09/08/2017 by Vena Austria, MD       Gestational age appropriate obstetric precautions including but not limited to vaginal bleeding, contractions, leaking of fluid and fetal movement were reviewed in detail with the patient.   - fasting and postprandials above goal - restart metformin 500mg  po bid - discussed insulin  Return in about 1 week (around 09/26/2017) for 1 week ROB, 2-4 weeks anatomy scan.  Vena Austria, MD, Ever[facility], [facility] 09/19/2017, 3:13 PM

## 2017-09-19 NOTE — Progress Notes (Signed)
ROB Migraine

## 2017-09-24 ENCOUNTER — Ambulatory Visit (INDEPENDENT_AMBULATORY_CARE_PROVIDER_SITE_OTHER): Payer: BLUE CROSS/BLUE SHIELD | Admitting: Obstetrics and Gynecology

## 2017-09-24 VITALS — BP 122/80 | Wt 179.0 lb

## 2017-09-24 DIAGNOSIS — Z3480 Encounter for supervision of other normal pregnancy, unspecified trimester: Secondary | ICD-10-CM

## 2017-09-24 DIAGNOSIS — O34219 Maternal care for unspecified type scar from previous cesarean delivery: Secondary | ICD-10-CM

## 2017-09-24 DIAGNOSIS — O26899 Other specified pregnancy related conditions, unspecified trimester: Secondary | ICD-10-CM

## 2017-09-24 DIAGNOSIS — Z3A17 17 weeks gestation of pregnancy: Secondary | ICD-10-CM

## 2017-09-24 DIAGNOSIS — Z6791 Unspecified blood type, Rh negative: Secondary | ICD-10-CM

## 2017-09-24 DIAGNOSIS — O24415 Gestational diabetes mellitus in pregnancy, controlled by oral hypoglycemic drugs: Secondary | ICD-10-CM

## 2017-09-24 DIAGNOSIS — Z8759 Personal history of other complications of pregnancy, childbirth and the puerperium: Secondary | ICD-10-CM

## 2017-09-24 DIAGNOSIS — O99891 Other specified diseases and conditions complicating pregnancy: Secondary | ICD-10-CM

## 2017-09-24 DIAGNOSIS — Z8659 Personal history of other mental and behavioral disorders: Secondary | ICD-10-CM

## 2017-09-24 DIAGNOSIS — O9989 Other specified diseases and conditions complicating pregnancy, childbirth and the puerperium: Secondary | ICD-10-CM

## 2017-09-24 LAB — POCT URINALYSIS DIPSTICK OB
Glucose, UA: NEGATIVE — AB
PROTEIN: NEGATIVE

## 2017-09-24 NOTE — Progress Notes (Signed)
Routine Prenatal Care Visit  Subjective  Denise Walker is a 27 y.o. G5P1031 at 5152w3d being seen today for ongoing prenatal care.  She is currently monitored for the following issues for this high-risk pregnancy and has Supervision of other normal pregnancy, antepartum; History of postpartum depression, currently pregnant; History of eclampsia; Rh negative state in antepartum period; Pregnancy with history of cesarean section, antepartum; and Gestational diabetes mellitus (GDM) controlled on oral hypoglycemic drug on their problem list.  ----------------------------------------------------------------------------------- Patient reports no complaints.   Contractions: Not present. Vag. Bleeding: None.  Movement: Absent. Denies leaking of fluid.  ----------------------------------------------------------------------------------- The following portions of the patient's history were reviewed and updated as appropriate: allergies, current medications, past family history, past medical history, past social history, past surgical history and problem list. Problem list updated.   Objective  Blood pressure 122/80, weight 179 lb (81.2 kg), last menstrual period 05/25/2017. Pregravid weight 186 lb (84.4 kg) Total Weight Gain -7 lb (-3.175 kg) Urinalysis:      Fetal Status: Fetal Heart Rate (bpm): 249   Movement: Absent     General:  Alert, oriented and cooperative. Patient is in no acute distress.  Skin: Skin is warm and dry. No rash noted.   Cardiovascular: Normal heart rate noted  Respiratory: Normal respiratory effort, no problems with respiration noted  Abdomen: Soft, gravid, appropriate for gestational age. Pain/Pressure: Absent     Pelvic:  Cervical exam deferred        Extremities: Normal range of motion.     ental Status: Normal mood and affect. Normal behavior. Normal judgment and thought content.     Assessment   27 y.o. [id number] at 8552w3d by  03/01/2018, by Last Menstrual Period  presenting for routine prenatal visit  Plan   Pregnacy#5 Problems (from 05/25/17 to present)    Problem Noted Resolved   Supervision of other normal pregnancy, antepartum 07/17/2017 by Vena AustriaStaebler, Abriella Filkins, MD No   Priority:  High     Overview Addendum 09/08/2017  1:07 PM by Vena AustriaStaebler, Eyad Rochford, MD    Clinic [facility] Prenatal Labs  Dating LMP = 6929w5d US Blood type: O negative  Genetic Screen NIPS: normal XX SMA neg, CF neg, FragileX neg Antibody:negative  Anatomic US  Rubella: Equivocal Varicella: Immune  GTT (GDM G1) Early: HgbA1C 5.5, 158 3-hr: 99 / 181 / 131 / 73 RPR: NR  Rhogam [ ]  28 weeks HBsAg: negative  TDaP vaccine                       Flu Shot: HIV: negative  Baby Food                                GBS:   Contraception  Pap: 07/22/17 NIL  CBB     CS/VBAC undecided   Support Person Cody           History of eclampsia 07/22/2017 by Vena AustriaStaebler, Elenie Coven, MD No   Priority:  Medium     Overview Addendum 09/03/2017  5:46 PM by Vena AustriaStaebler, Edahi Kroening, MD    [X]  Aspirin 81 mg daily after 12 weeks; discontinue after 36 weeks  Baseline and surveillance labs [facility] refresh links as needed)  Lab Results  Component Value Date   PLT 292 07/22/2017   CREATININE 0.73 07/22/2017   AST 16 07/22/2017   ALT 13 07/22/2017   PROTCRRATIO 69 07/16/2014  Gestational diabetes mellitus (GDM) controlled on oral hypoglycemic drug 09/08/2017 by Vena AustriaStaebler, Khalin Royce, MD No   Overview Addendum 09/24/2017  5:04 PM by Vena AustriaStaebler, Javonda Suh, MD    Current Diabetic Medications:  None (was on metformin PCOS)   [X]  Diabetes Education and Testing Supplies [X]  Nutrition Counsult [X]  Fetal ECHO after 22-24 weeks  [ ]  Delivery planning contingent on fetal growth, AFI, glycemic control, and other co-morbidities but at least by 39 weeks  Metformin 500mg  bid  Baseline and surveillance [facility] refresh links as needed)  Lab Results  Component Value Date   CREATININE 0.73  07/22/2017   AST 16 07/22/2017   ALT 13 07/22/2017   TSH 2.170 03/27/2017   PROTCRRATIO 1.16 (H) 07/16/2014   Lab Results  Component Value Date   HGBA1C 5.5 07/22/2017    Antenatal Testing Class of DM U/S NST/AFI DELIVERY  Diabetes   A1 - good control - O24.410    A2 - good control - O24.419      A2  - poor control or poor compliance - O24.419, E11.65   (Macrosomia or polyhydramnios) **E11.65 is extra code for poor control**    A2/B - O24.919  and B-C O24.319  Poor control B-C or D-R-F-T - O24.319  or  Type I DM - O24.019  20-38  20-38  20-24-28-32-36   20-24-28-32-35-38//fetal echo  20-24-27-30-33-36-38//fetal echo  40  32//2 x wk  32//2 x wk   32//2 x wk  28//BPP wkly then 32//2 x wk  40  39  PRN   39  PRN          Rh negative state in antepartum period 08/23/2017 by Vena AustriaStaebler, Reynoldo Mainer, MD No   Overview Signed 08/23/2017  8:29 PM by Vena AustriaStaebler, Rheagan Nayak, MD    [ ]  Rhogam 28 weeks      Pregnancy with history of cesarean section, antepartum 08/23/2017 by Vena AustriaStaebler, Dezire Turk, MD No   History of postpartum depression, currently pregnant 07/17/2017 by Vena AustriaStaebler, Jerre Vandrunen, MD No   History of gestational diabetes in prior pregnancy, currently pregnant 08/23/2017 by Vena AustriaStaebler, Jedaiah Rathbun, MD 09/08/2017 by Vena AustriaStaebler, Krystol Rocco, MD       Gestational age appropriate obstetric precautions including but not limited to vaginal bleeding, contractions, leaking of fluid and fetal movement were reviewed in detail with the patient.   - fetal echo order placed - fasting values near goal 95, postprandials at goal continue metformin 500mg  bid  Return in about 1 week (around 10/01/2017) for ROB.  Vena AustriaAndreas Samad Thon, MD, Evern CoreFACOG [facility], [facility] 09/24/2017, 5:05 PM

## 2017-09-24 NOTE — Progress Notes (Signed)
ROB

## 2017-09-25 ENCOUNTER — Encounter: Payer: Self-pay | Admitting: Dietician

## 2017-09-25 ENCOUNTER — Encounter: Payer: BLUE CROSS/BLUE SHIELD | Admitting: Dietician

## 2017-09-25 VITALS — BP 92/56 | Ht 59.0 in | Wt 176.1 lb

## 2017-09-25 DIAGNOSIS — O24410 Gestational diabetes mellitus in pregnancy, diet controlled: Secondary | ICD-10-CM

## 2017-09-25 DIAGNOSIS — Z713 Dietary counseling and surveillance: Secondary | ICD-10-CM | POA: Diagnosis not present

## 2017-09-25 DIAGNOSIS — O24415 Gestational diabetes mellitus in pregnancy, controlled by oral hypoglycemic drugs: Secondary | ICD-10-CM | POA: Diagnosis not present

## 2017-09-25 NOTE — Progress Notes (Signed)
   Patient's BG record indicates fasting BGs ranging 85-97 + 1(of 13) reading of 113; post-meal BGs ranging 88-125 (2 of 39 readings over 120).   Patient's food diary indicates generally balanced meals, some low in carbohydrate; she strictly limited carbs prior to starting Metformin, has not increased carb intake since starting medication yet.    Provided 1650 kcal meal plan, and wrote individualized menus based on patient's food preferences.  Instructed patient on food safety, including avoidance of Listeriosis, and limiting mercury from fish (patient does not eat fish).  Discussed importance of maintaining healthy lifestyle habits to reduce risk of Type 2 DM as well as Gestational DM with any future pregnancies.  Advised patient to use any remaining testing supplies to test some BGs after delivery, and to have BG tested ideally annually, as well as prior to attempting future pregnancies.

## 2017-09-25 NOTE — Patient Instructions (Signed)
   Continue with health food choices and regular exercise, great job!  OK to add small amounts of carb to some meals to reach goal of 30-45grams. Ideally whole grain, high fiber carb choices.

## 2017-10-01 ENCOUNTER — Ambulatory Visit (INDEPENDENT_AMBULATORY_CARE_PROVIDER_SITE_OTHER): Payer: BLUE CROSS/BLUE SHIELD

## 2017-10-01 ENCOUNTER — Ambulatory Visit (INDEPENDENT_AMBULATORY_CARE_PROVIDER_SITE_OTHER): Payer: BLUE CROSS/BLUE SHIELD | Admitting: Obstetrics and Gynecology

## 2017-10-01 VITALS — BP 110/56 | Wt 175.0 lb

## 2017-10-01 DIAGNOSIS — Z0489 Encounter for examination and observation for other specified reasons: Secondary | ICD-10-CM

## 2017-10-01 DIAGNOSIS — O09292 Supervision of pregnancy with other poor reproductive or obstetric history, second trimester: Secondary | ICD-10-CM

## 2017-10-01 DIAGNOSIS — IMO0002 Reserved for concepts with insufficient information to code with codable children: Secondary | ICD-10-CM

## 2017-10-01 DIAGNOSIS — Z363 Encounter for antenatal screening for malformations: Secondary | ICD-10-CM

## 2017-10-01 DIAGNOSIS — Z8759 Personal history of other complications of pregnancy, childbirth and the puerperium: Secondary | ICD-10-CM

## 2017-10-01 DIAGNOSIS — Z3A16 16 weeks gestation of pregnancy: Secondary | ICD-10-CM

## 2017-10-01 DIAGNOSIS — O34219 Maternal care for unspecified type scar from previous cesarean delivery: Secondary | ICD-10-CM

## 2017-10-01 DIAGNOSIS — Z3480 Encounter for supervision of other normal pregnancy, unspecified trimester: Secondary | ICD-10-CM

## 2017-10-01 DIAGNOSIS — O26899 Other specified pregnancy related conditions, unspecified trimester: Secondary | ICD-10-CM

## 2017-10-01 DIAGNOSIS — Z8659 Personal history of other mental and behavioral disorders: Secondary | ICD-10-CM

## 2017-10-01 DIAGNOSIS — O24415 Gestational diabetes mellitus in pregnancy, controlled by oral hypoglycemic drugs: Secondary | ICD-10-CM

## 2017-10-01 DIAGNOSIS — O360120 Maternal care for anti-D [Rh] antibodies, second trimester, not applicable or unspecified: Secondary | ICD-10-CM

## 2017-10-01 DIAGNOSIS — Z3A18 18 weeks gestation of pregnancy: Secondary | ICD-10-CM

## 2017-10-01 DIAGNOSIS — O9989 Other specified diseases and conditions complicating pregnancy, childbirth and the puerperium: Secondary | ICD-10-CM

## 2017-10-01 DIAGNOSIS — Z6791 Unspecified blood type, Rh negative: Secondary | ICD-10-CM

## 2017-10-01 DIAGNOSIS — O99891 Other specified diseases and conditions complicating pregnancy: Secondary | ICD-10-CM

## 2017-10-01 NOTE — Progress Notes (Signed)
Anatomy scan/ It is a GIRL

## 2017-10-01 NOTE — Progress Notes (Signed)
Routine Prenatal Care Visit  Subjective  Denise Walker is a 27 y.o. G5P1031 at [redacted]w[redacted]d being seen today for ongoing prenatal care.  She is currently monitored for the following issues for this high-risk pregnancy and has Supervision of other normal pregnancy, antepartum; History of postpartum depression, currently pregnant; History of eclampsia; Rh negative state in antepartum period; Pregnancy with history of cesarean section, antepartum; and Gestational diabetes mellitus (GDM) controlled on oral hypoglycemic drug on their problem list.  ----------------------------------------------------------------------------------- Patient reports no complaints.   Contractions: Not present. Vag. Bleeding: None.  Movement: Present. Denies leaking of fluid.  ----------------------------------------------------------------------------------- The following portions of the patient's history were reviewed and updated as appropriate: allergies, current medications, past family history, past medical history, past social history, past surgical history and problem list. Problem list updated.   Objective  Blood pressure (!) 110/56, weight 175 lb (79.4 kg), last menstrual period 05/25/2017. Pregravid weight 186 lb (84.4 kg) Total Weight Gain -11 lb (-4.99 kg) Urinalysis:      Fetal Status: Fetal Heart Rate (bpm): 140   Movement: Present     General:  Alert, oriented and cooperative. Patient is in no acute distress.  Skin: Skin is warm and dry. No rash noted.   Cardiovascular: Normal heart rate noted  Respiratory: Normal respiratory effort, no problems with respiration noted  Abdomen: Soft, gravid, appropriate for gestational age. Pain/Pressure: Absent     Pelvic:  Cervical exam deferred        Extremities: Normal range of motion.     ental Status: Normal mood and affect. Normal behavior. Normal judgment and thought content.   US Ob Comp + 14 Wk  Result Date: 10/01/2017 ULTRASOUND REPORT Patient Name:  Denise Walker DOB: 08/19/1990 MRN: [id number] Location: [facility] Date of Service: 10/01/2017 Indications:Anatomy Ultrasound Findings: Mason Jim intrauterine pregnancy is visualized with FHR at 144 BPM. Biometrics give an (U/S) Gestational age of [redacted]w[redacted]d and an (U/S) EDD of 03/04/2018; this correlates with the clinically established Estimated Date of Delivery: 03/01/18 Fetal presentation is Breech. EFW: 8oz/ 221g. Placenta: Anterior, marginal Previa, grade 0. AFI: subjectively normal. Anatomic survey is incomplete for cardiac, diaphragm, nose/lip, trv spine and normal; Gender - female.  Right Ovary is normal in appearance. Left Ovary is normal appearance. Survey of the adnexa demonstrates no adnexal masses. There is no free peritoneal fluid in the cul de sac. Impression: 1. [redacted]w[redacted]d Viable Singleton Intrauterine pregnancy by U/S. 2. (U/S) EDD is consistent with Clinically established Estimated Date of Delivery: 03/01/18 . 3. Normal Anatomy Scan Recommendations: 1.Clinical correlation with the patient's History and Physical Exam. Willette Alma, RDMS, RVT  There is a singleton gestation with subjectively normal amniotic fluid volume. The fetal biometry correlates with established dating. Detailed evaluation of the fetal anatomy was performed.The fetal anatomical survey appears within normal limits within the resolution of ultrasound as described above.  Above noted views not adequately visualized.  It must be noted that a normal ultrasound is unable to rule out fetal aneuploidy.  Vena Austria, MD, [facility], [facility] 10/01/2017, 10:25 AM     Assessment   26 y.o. [id number] at [redacted]w[redacted]d by  03/01/2018, by Last Menstrual Period presenting for routine prenatal visit  Plan   Pregnacy#5 Problems (from 05/25/17 to present)    Problem Noted Resolved   Supervision of other normal pregnancy, antepartum 07/17/2017 by Vena Austria, MD No   Priority:  High     Overview Addendum 09/08/2017   1:07 PM by Vena Austria,  MD    [facility]inic [facility]atal [facility]  Dating LMP = 3345w5d US Blood type: O negative  Genetic Screen NIPS: normal XX SMA neg, CF neg, FragileX neg Antibody:negative  Anatomic US  Rubella: Equivocal Varicella: Immune  GTT (GDM G1) Early: HgbA1C 5.5, 158 3-hr: 99 / 181 / 131 / 73 RPR: NR  Rhogam [ ]  28 weeks HBsAg: negative  TDaP vaccine                       Flu Shot: HIV: negative  Baby Food                                GBS:   Contraception  Pap: 07/22/17 NIL  CBB     CS/VBAC undecided   Support Person Cody           History of eclampsia 07/22/2017 by Vena AustriaStaebler, Jaxyn Rout, MD No   Priority:  Medium     Overview Addendum 09/03/2017  5:46 PM by Vena AustriaStaebler, Dayona Shaheen, MD    [X]  Aspirin 81 mg daily after 12 weeks; discontinue after 36 weeks  Baseline and surveillance [facility] refresh links as needed)  Lab Results  Component Value Date   PLT 292 07/22/2017   CREATININE 0.73 07/22/2017   AST 16 07/22/2017   ALT 13 07/22/2017   PROTCRRATIO 69 07/16/2014           Gestational diabetes mellitus (GDM) controlled on oral hypoglycemic drug 09/08/2017 by Vena AustriaStaebler, Diannia Hogenson, MD No   Overview Addendum 09/24/2017  5:10 PM by Vena AustriaStaebler, Gerhart Ruggieri, MD    Current Diabetic Medications:  None (was on metformin PCOS)   [X]  Diabetes Education and Testing Supplies [X]  Nutrition Counsult [X]  Fetal ECHO after 22-24 weeks  [ ]  Delivery planning contingent on fetal growth, AFI, glycemic control, and other co-morbidities but at least by 39 weeks  Metformin 500mg  bid  Baseline and surveillance labs [facility] refresh links as needed)  Lab Results  Component Value Date   CREATININE 0.73 07/22/2017   AST 16 07/22/2017   ALT 13 07/22/2017   TSH 2.170 03/27/2017   PROTCRRATIO 1.16 (H) 07/16/2014   Lab Results  Component Value Date   HGBA1C 5.5 07/22/2017    Antenatal Testing Class of DM U/S NST/AFI DELIVERY  Diabetes   A1 - good control -  O24.410    A2 - good control - O24.419      A2  - poor control or poor compliance - O24.419, E11.65   (Macrosomia or polyhydramnios) **E11.65 is extra code for poor control**    A2/B - O24.919  and B-C O24.319  Poor control B-C or D-R-F-T - O24.319  or  Type I DM - O24.019  20-38  20-38  20-24-28-32-36   20-24-28-32-35-38//fetal echo  20-24-27-30-33-36-38//fetal echo  40  32//2 x wk  32//2 x wk   32//2 x wk  28//BPP wkly then 32//2 x wk  40  39  PRN   39  PRN          Rh negative state in antepartum period 08/23/2017 by Vena AustriaStaebler, Tahj Lindseth, MD No   Overview Signed 08/23/2017  8:29 PM by Vena AustriaStaebler, Tharon Bomar, MD    [ ]  Rhogam 28 weeks      Pregnancy with history of cesarean section, antepartum 08/23/2017 by Vena AustriaStaebler, Damian Buckles, MD No   History of postpartum depression, currently pregnant 07/17/2017 by Vena AustriaStaebler, Autumn Pruitt, MD  No   History of gestational diabetes in prior pregnancy, currently pregnant 08/23/2017 by Vena AustriaStaebler, Giani Winther, MD 09/08/2017 by Vena AustriaStaebler, Mila Pair, MD       Gestational age appropriate obstetric precautions including but not limited to vaginal bleeding, contractions, leaking of fluid and fetal movement were reviewed in detail with the patient.    Return in about 2 weeks (around 10/15/2017) for ROB in 2 weeks, ROB follow up anatomy scan in 4 weeks.  Vena AustriaAndreas Meleah Demeyer, MD, Merlinda FrederickFACOG [facility], [facility] 10/01/2017, 10:44 AM

## 2017-10-15 ENCOUNTER — Ambulatory Visit (INDEPENDENT_AMBULATORY_CARE_PROVIDER_SITE_OTHER): Payer: BLUE CROSS/BLUE SHIELD | Admitting: Obstetrics and Gynecology

## 2017-10-15 VITALS — BP 112/66 | Wt 176.0 lb

## 2017-10-15 DIAGNOSIS — Z3480 Encounter for supervision of other normal pregnancy, unspecified trimester: Secondary | ICD-10-CM

## 2017-10-15 DIAGNOSIS — O34219 Maternal care for unspecified type scar from previous cesarean delivery: Secondary | ICD-10-CM

## 2017-10-15 DIAGNOSIS — Z8659 Personal history of other mental and behavioral disorders: Secondary | ICD-10-CM

## 2017-10-15 DIAGNOSIS — O09292 Supervision of pregnancy with other poor reproductive or obstetric history, second trimester: Secondary | ICD-10-CM

## 2017-10-15 DIAGNOSIS — O99891 Other specified diseases and conditions complicating pregnancy: Secondary | ICD-10-CM

## 2017-10-15 DIAGNOSIS — IMO0002 Reserved for concepts with insufficient information to code with codable children: Secondary | ICD-10-CM

## 2017-10-15 DIAGNOSIS — Z0489 Encounter for examination and observation for other specified reasons: Secondary | ICD-10-CM

## 2017-10-15 DIAGNOSIS — Z3A20 20 weeks gestation of pregnancy: Secondary | ICD-10-CM

## 2017-10-15 DIAGNOSIS — Z8759 Personal history of other complications of pregnancy, childbirth and the puerperium: Secondary | ICD-10-CM

## 2017-10-15 DIAGNOSIS — O9989 Other specified diseases and conditions complicating pregnancy, childbirth and the puerperium: Secondary | ICD-10-CM

## 2017-10-15 DIAGNOSIS — O24415 Gestational diabetes mellitus in pregnancy, controlled by oral hypoglycemic drugs: Secondary | ICD-10-CM

## 2017-10-15 LAB — POCT URINALYSIS DIPSTICK OB
Glucose, UA: NEGATIVE
PROTEIN: NEGATIVE

## 2017-10-15 NOTE — Progress Notes (Signed)
Routine Prenatal Care Visit  Subjective  Denise Walker is a 27 y.o. G5P1031 at [redacted]w[redacted]d being seen today for ongoing prenatal care.  She is currently monitored for the following issues for this high-risk pregnancy and has Supervision of other normal pregnancy, antepartum; History of postpartum depression, currently pregnant; History of eclampsia; Rh negative state in antepartum period; Pregnancy with history of cesarean section, antepartum; and Gestational diabetes mellitus (GDM) controlled on oral hypoglycemic drug on their problem list.  ----------------------------------------------------------------------------------- Patient reports no complaints.   Contractions: Not present. Vag. Bleeding: None.  Movement: Present. Denies leaking of fluid.  ----------------------------------------------------------------------------------- The following portions of the patient's history were reviewed and updated as appropriate: allergies, current medications, past family history, past medical history, past social history, past surgical history and problem list. Problem list updated.   Objective  Blood pressure 112/66, weight 176 lb (79.8 kg), last menstrual period 05/25/2017. Pregravid weight 186 lb (84.4 kg) Total Weight Gain -10 lb (-4.536 kg) Urinalysis:      Fetal Status: Fetal Heart Rate (bpm): 140   Movement: Present     General:  Alert, oriented and cooperative. Patient is in no acute distress.  Skin: Skin is warm and dry. No rash noted.   Cardiovascular: Normal heart rate noted  Respiratory: Normal respiratory effort, no problems with respiration noted  Abdomen: Soft, gravid, appropriate for gestational age. Pain/Pressure: Absent     Pelvic:  Cervical exam deferred        Extremities: Normal range of motion.     ental Status: Normal mood and affect. Normal behavior. Normal judgment and thought content.     Assessment   27 y.o. [id number] at [redacted]w[redacted]d by  03/01/2018, by Last Menstrual Period  presenting for routine prenatal visit  Plan   Pregnacy#5 Problems (from 05/25/17 to present)    Problem Noted Resolved   Supervision of other normal pregnancy, antepartum 07/17/2017 by Vena Austria, MD No   Priority:  High     Overview Addendum 09/08/2017  1:07 PM by Vena Austria, MD    Clinic [facility] Prenatal Labs  Dating LMP = [redacted]w[redacted]d [place] Blood type: O negative  Genetic Screen NIPS: normal XX SMA neg, CF neg, FragileX neg Antibody:negative  Anatomic [place]  Rubella: Equivocal Varicella: Immune  GTT (GDM G1) Early: HgbA1C 5.5, 158 3-hr: 99 / 181 / 131 / 73 RPR: NR  Rhogam [ ]  28 weeks HBsAg: negative  TDaP vaccine                       Flu Shot: HIV: negative  Baby Food                                GBS:   Contraception  Pap: 07/22/17 NIL  CBB     CS/VBAC undecided   Support Person Cody           History of eclampsia 07/22/2017 by Vena Austria, MD No   Priority:  Medium     Overview Addendum 09/03/2017  5:46 PM by Vena Austria, MD    [X]  Aspirin 81 mg daily after 12 weeks; discontinue after 36 weeks  Baseline and surveillance labs (pulled in from [facility] refresh links as needed)  Lab Results  Component Value Date   PLT 292 07/22/2017   CREATININE 0.73 07/22/2017   AST 16 07/22/2017   ALT 13 07/22/2017   PROTCRRATIO 69 07/16/2014  Gestational diabetes mellitus (GDM) controlled on oral hypoglycemic drug 09/08/2017 by Vena Austria, MD No   Overview Addendum 09/24/2017  5:10 PM by Vena Austria, MD    Current Diabetic Medications:  None (was on metformin PCOS)   [X]  Diabetes Education and Testing Supplies [X]  Nutrition Counsult [X]  Fetal ECHO after 22-24 weeks  [ ]  Delivery planning contingent on fetal growth, AFI, glycemic control, and other co-morbidities but at least by 39 weeks  Metformin 500mg  bid  Baseline and surveillance labs (pulled in from [facility], refresh links as needed)  Lab Results  Component Value Date   CREATININE 0.73  07/22/2017   AST 16 07/22/2017   ALT 13 07/22/2017   TSH 2.170 03/27/2017   PROTCRRATIO 1.16 (H) 07/16/2014   Lab Results  Component Value Date   HGBA1C 5.5 07/22/2017    Antenatal Testing Class of DM U/S NST/AFI DELIVERY  Diabetes   A1 - good control - O24.410    A2 - good control - O24.419      A2  - poor control or poor compliance - O24.419, E11.65   (Macrosomia or polyhydramnios) **E11.65 is extra code for poor control**    A2/B - O24.919  and B-C O24.319  Poor control B-C or D-R-F-T - O24.319  or  Type I DM - O24.019  20-38  20-38  20-24-28-32-36   20-24-28-32-35-38//fetal echo  20-24-27-30-33-36-38//fetal echo  40  32//2 x wk  32//2 x wk   32//2 x wk  28//BPP wkly then 32//2 x wk  40  39  PRN   39  PRN          Rh negative state in antepartum period 08/23/2017 by Vena Austria, MD No   Overview Signed 08/23/2017  8:29 PM by Vena Austria, MD    [ ]  Rhogam 28 weeks      Pregnancy with history of cesarean section, antepartum 08/23/2017 by Vena Austria, MD No   History of postpartum depression, currently pregnant 07/17/2017 by Vena Austria, MD No   History of gestational diabetes in prior pregnancy, currently pregnant 08/23/2017 by Vena Austria, MD 09/08/2017 by Vena Austria, MD       Gestational age appropriate obstetric precautions including but not limited to vaginal bleeding, contractions, leaking of fluid and fetal movement were reviewed in detail with the patient.   - Fasting BG remain elevated, meal times at goal  - switch to insulin levemir 20U qhs and aspart 2 units ac breakfast, lunch, and dinner  Return in about 1 week (around 10/22/2017) for 1 weeks ROB, 2 weeks ROB and follow up anatomy scan.  Vena Austria, MD, [facility], [facility]

## 2017-10-15 NOTE — Progress Notes (Signed)
ROB

## 2017-10-16 ENCOUNTER — Other Ambulatory Visit: Payer: Self-pay | Admitting: Obstetrics and Gynecology

## 2017-10-16 MED ORDER — INSULIN SYRINGE-NEEDLE U-100 29G 0.5 ML MISC: 3 refills | Status: DC

## 2017-10-16 MED ORDER — INSULIN ASPART 100 UNIT/ML FLEXPEN: 2.0000 [IU] | PEN_INJECTOR | Freq: Three times a day (TID) | SUBCUTANEOUS | 11 refills | Status: DC

## 2017-10-16 MED ORDER — INSULIN PEN NEEDLE 32G X 6 MM MISC: 3 refills | Status: DC

## 2017-10-16 MED ORDER — INSULIN DETEMIR 100 UNIT/ML ~~LOC~~ SOLN: 20.0000 [IU] | Freq: Every day | SUBCUTANEOUS | 11 refills | Status: DC

## 2017-10-16 NOTE — Telephone Encounter (Signed)
Order is in not sure if they'll need any additional info from insurance stand point

## 2017-10-23 ENCOUNTER — Ambulatory Visit (INDEPENDENT_AMBULATORY_CARE_PROVIDER_SITE_OTHER): Payer: BLUE CROSS/BLUE SHIELD | Admitting: Obstetrics and Gynecology

## 2017-10-23 VITALS — BP 116/70 | Wt 176.0 lb

## 2017-10-23 DIAGNOSIS — Z8659 Personal history of other mental and behavioral disorders: Secondary | ICD-10-CM

## 2017-10-23 DIAGNOSIS — Z3480 Encounter for supervision of other normal pregnancy, unspecified trimester: Secondary | ICD-10-CM

## 2017-10-23 DIAGNOSIS — Z23 Encounter for immunization: Secondary | ICD-10-CM

## 2017-10-23 DIAGNOSIS — O9989 Other specified diseases and conditions complicating pregnancy, childbirth and the puerperium: Secondary | ICD-10-CM

## 2017-10-23 DIAGNOSIS — O09292 Supervision of pregnancy with other poor reproductive or obstetric history, second trimester: Secondary | ICD-10-CM

## 2017-10-23 DIAGNOSIS — Z3A21 21 weeks gestation of pregnancy: Secondary | ICD-10-CM

## 2017-10-23 DIAGNOSIS — O34219 Maternal care for unspecified type scar from previous cesarean delivery: Secondary | ICD-10-CM

## 2017-10-23 DIAGNOSIS — Z8759 Personal history of other complications of pregnancy, childbirth and the puerperium: Secondary | ICD-10-CM

## 2017-10-23 DIAGNOSIS — O99891 Other specified diseases and conditions complicating pregnancy: Secondary | ICD-10-CM

## 2017-10-23 LAB — POCT URINALYSIS DIPSTICK OB: PROTEIN: NEGATIVE

## 2017-10-23 NOTE — Progress Notes (Signed)
Routine Prenatal Care Visit  Subjective  Denise Walker is a 27 y.o. G5P1031 at 2715w4d being seen today for ongoing prenatal care.  She is currently monitored for the following issues for this high-risk pregnancy and has Supervision of other normal pregnancy, antepartum; History of postpartum depression, currently pregnant; History of eclampsia; Rh negative state in antepartum period; Pregnancy with history of cesarean section, antepartum; and Gestational diabetes mellitus (GDM) requiring insulin on their problem list.  ----------------------------------------------------------------------------------- Patient reports no complaints.   Contractions: Not present. Vag. Bleeding: None.  Movement: Present. Denies leaking of fluid.  ----------------------------------------------------------------------------------- The following portions of the patient's history were reviewed and updated as appropriate: allergies, current medications, past family history, past medical history, past social history, past surgical history and problem list. Problem list updated.   Objective  Blood pressure 116/70, weight 176 lb (79.8 kg), last menstrual period 05/25/2017. Pregravid weight 186 lb (84.4 kg) Total Weight Gain -10 lb (-4.536 kg) Urinalysis:      Fetal Status: Fetal Heart Rate (bpm): 145 Fundal Height: 22 cm Movement: Present     General:  Alert, oriented and cooperative. Patient is in no acute distress.  Skin: Skin is warm and dry. No rash noted.   Cardiovascular: Normal heart rate noted  Respiratory: Normal respiratory effort, no problems with respiration noted  Abdomen: Soft, gravid, appropriate for gestational age. Pain/Pressure: Absent     Pelvic:  Cervical exam deferred        Extremities: Normal range of motion.     ental Status: Normal mood and affect. Normal behavior. Normal judgment and thought content.     Assessment   27 y.o. [id number] at 8515w4d by  03/01/2018, by Last Menstrual Period  presenting for routine prenatal visit  Plan   Pregnacy#5 Problems (from 05/25/17 to present)    Problem Noted Resolved   Supervision of other normal pregnancy, antepartum 07/17/2017 by Vena AustriaStaebler, Francoise Chojnowski, MD No   Priority:  High     Overview Addendum 09/08/2017  1:07 PM by Vena AustriaStaebler, Keiley Levey, MD    [facility]inic [facility] Prenatal Labs  Dating LMP = 3635w5d US Blood type: O negative  Genetic Screen NIPS: normal XX SMA neg, CF neg, FragileX neg Antibody:negative  Anatomic US  Rubella: Equivocal Varicella: Immune  GTT (GDM G1) Early: HgbA1C 5.5, 158 3-hr: 99 / 181 / 131 / 73 RPR: NR  Rhogam [ ]  28 weeks HBsAg: negative  TDaP vaccine                       Flu Shot: HIV: negative  Baby Food                                GBS:   Contraception  Pap: 07/22/17 NIL  CBB     CS/VBAC undecided   Support Person Cody           History of eclampsia 07/22/2017 by Vena AustriaStaebler, Everet Flagg, MD No   Priority:  Medium     Overview Addendum 09/03/2017  5:46 PM by Vena AustriaStaebler, Yoan Sallade, MD    [X]  Aspirin 81 mg daily after 12 weeks; discontinue after 36 weeks  Baseline and surveillance labs [facility] refresh links as needed)  Lab Results  Component Value Date   PLT 292 07/22/2017   CREATININE 0.73 07/22/2017   AST 16 07/22/2017   ALT 13 07/22/2017   PROTCRRATIO 69 07/16/2014  Gestational diabetes mellitus (GDM) controlled on oral hypoglycemic drug 09/08/2017 by Vena Austria, MD No   Overview Addendum 09/24/2017  5:10 PM by Vena Austria, MD    Current Diabetic Medications:  None (was on metformin PCOS)   [X]  Diabetes Education and Testing Supplies [X]  Nutrition Counsult [X]  Fetal ECHO after 22-24 weeks  [ ]  Delivery planning contingent on fetal growth, AFI, glycemic control, and other co-morbidities but at least by 39 weeks  Metformin 500mg  bid  Baseline and surveillance labs (pulled in from [facility], refresh links as needed)  Lab Results  Component Value Date   CREATININE 0.73  07/22/2017   AST 16 07/22/2017   ALT 13 07/22/2017   TSH 2.170 03/27/2017   PROTCRRATIO 1.16 (H) 07/16/2014   Lab Results  Component Value Date   HGBA1C 5.5 07/22/2017    Antenatal Testing Class of DM U/S NST/AFI DELIVERY  Diabetes   A1 - good control - O24.410    A2 - good control - O24.419      A2  - poor control or poor compliance - O24.419, E11.65   (Macrosomia or polyhydramnios) **E11.65 is extra code for poor control**    A2/B - O24.919  and B-C O24.319  Poor control B-C or D-R-F-T - O24.319  or  Type I DM - O24.019  20-38  20-38  20-24-28-32-36   20-24-28-32-35-38//fetal echo  20-24-27-30-33-36-38//fetal echo  40  32//2 x wk  32//2 x wk   32//2 x wk  28//BPP wkly then 32//2 x wk  40  39  PRN   39  PRN          Rh negative state in antepartum period 08/23/2017 by Vena Austria, MD No   Overview Signed 08/23/2017  8:29 PM by Vena Austria, MD    [ ]  Rhogam 28 weeks      Pregnancy with history of cesarean section, antepartum 08/23/2017 by Vena Austria, MD No   History of postpartum depression, currently pregnant 07/17/2017 by Vena Austria, MD No   History of gestational diabetes in prior pregnancy, currently pregnant 08/23/2017 by Vena Austria, MD 09/08/2017 by Vena Austria, MD       Gestational age appropriate obstetric precautions including but not limited to vaginal bleeding, contractions, leaking of fluid and fetal movement were reviewed in detail with the patient.   Discussed benefits of influenza vaccination during pregnancy.  The CDC recommends influenza vaccination for all pregnant patient regardless of trimester.  Besides the benefits of preventing influenza in the mother we discussed transfer of maternal antibodies to fetus as well as secretion of antibodies in breast milk which are protective for the infant.    In case of positive influenza test in the patient or a close household contact, the use of Tamiflu  is also recommended by the CDC.   - flu shot today  - BG at goal no insulin adjustments, fastings now below 90 - follow up in 2 weeks for BG check  Return in about 2 weeks (around 11/06/2017) for ROB.  Vena Austria, MD, [facility], [facility] 10/23/2017, 8:49 PM

## 2017-10-23 NOTE — Progress Notes (Signed)
ROB

## 2017-10-24 DIAGNOSIS — O24415 Gestational diabetes mellitus in pregnancy, controlled by oral hypoglycemic drugs: Secondary | ICD-10-CM | POA: Diagnosis not present

## 2017-10-24 DIAGNOSIS — O99282 Endocrine, nutritional and metabolic diseases complicating pregnancy, second trimester: Secondary | ICD-10-CM | POA: Diagnosis not present

## 2017-10-24 DIAGNOSIS — E232 Diabetes insipidus: Secondary | ICD-10-CM | POA: Diagnosis not present

## 2017-10-24 DIAGNOSIS — Z3A21 21 weeks gestation of pregnancy: Secondary | ICD-10-CM | POA: Diagnosis not present

## 2017-10-24 DIAGNOSIS — O99280 Endocrine, nutritional and metabolic diseases complicating pregnancy, unspecified trimester: Secondary | ICD-10-CM | POA: Diagnosis not present

## 2017-10-24 DIAGNOSIS — O359XX0 Maternal care for (suspected) fetal abnormality and damage, unspecified, not applicable or unspecified: Secondary | ICD-10-CM | POA: Diagnosis not present

## 2017-10-29 ENCOUNTER — Ambulatory Visit (INDEPENDENT_AMBULATORY_CARE_PROVIDER_SITE_OTHER): Payer: BLUE CROSS/BLUE SHIELD

## 2017-10-29 ENCOUNTER — Ambulatory Visit (INDEPENDENT_AMBULATORY_CARE_PROVIDER_SITE_OTHER): Payer: BLUE CROSS/BLUE SHIELD | Admitting: Obstetrics and Gynecology

## 2017-10-29 VITALS — BP 112/74 | Wt 176.0 lb

## 2017-10-29 DIAGNOSIS — Z3A22 22 weeks gestation of pregnancy: Secondary | ICD-10-CM

## 2017-10-29 DIAGNOSIS — Z362 Encounter for other antenatal screening follow-up: Secondary | ICD-10-CM | POA: Diagnosis not present

## 2017-10-29 DIAGNOSIS — O26899 Other specified pregnancy related conditions, unspecified trimester: Secondary | ICD-10-CM

## 2017-10-29 DIAGNOSIS — Z8659 Personal history of other mental and behavioral disorders: Secondary | ICD-10-CM

## 2017-10-29 DIAGNOSIS — O9989 Other specified diseases and conditions complicating pregnancy, childbirth and the puerperium: Secondary | ICD-10-CM

## 2017-10-29 DIAGNOSIS — O24415 Gestational diabetes mellitus in pregnancy, controlled by oral hypoglycemic drugs: Secondary | ICD-10-CM

## 2017-10-29 DIAGNOSIS — Z6791 Unspecified blood type, Rh negative: Secondary | ICD-10-CM

## 2017-10-29 DIAGNOSIS — O360120 Maternal care for anti-D [Rh] antibodies, second trimester, not applicable or unspecified: Secondary | ICD-10-CM

## 2017-10-29 DIAGNOSIS — O34219 Maternal care for unspecified type scar from previous cesarean delivery: Secondary | ICD-10-CM

## 2017-10-29 DIAGNOSIS — Z0489 Encounter for examination and observation for other specified reasons: Secondary | ICD-10-CM

## 2017-10-29 DIAGNOSIS — O0990 Supervision of high risk pregnancy, unspecified, unspecified trimester: Secondary | ICD-10-CM

## 2017-10-29 DIAGNOSIS — O99891 Other specified diseases and conditions complicating pregnancy: Secondary | ICD-10-CM

## 2017-10-29 DIAGNOSIS — O24414 Gestational diabetes mellitus in pregnancy, insulin controlled: Secondary | ICD-10-CM

## 2017-10-29 DIAGNOSIS — Z8759 Personal history of other complications of pregnancy, childbirth and the puerperium: Secondary | ICD-10-CM

## 2017-10-29 DIAGNOSIS — IMO0002 Reserved for concepts with insufficient information to code with codable children: Secondary | ICD-10-CM

## 2017-10-29 DIAGNOSIS — O09292 Supervision of pregnancy with other poor reproductive or obstetric history, second trimester: Secondary | ICD-10-CM

## 2017-10-29 DIAGNOSIS — Z3480 Encounter for supervision of other normal pregnancy, unspecified trimester: Secondary | ICD-10-CM

## 2017-10-29 LAB — POCT URINALYSIS DIPSTICK OB
Glucose, UA: NEGATIVE
PROTEIN: NEGATIVE

## 2017-10-29 NOTE — Progress Notes (Signed)
ROB U/S today Painful spot on left leg, warm to touch

## 2017-10-29 NOTE — Progress Notes (Signed)
Routine Prenatal Care Visit  Subjective  Denise Walker is a 27 y.o. G5P1031 at [redacted]w[redacted]d being seen today for ongoing prenatal care.  She is currently monitored for the following issues for this high-risk pregnancy and has Supervision of high risk pregnancy, antepartum; History of postpartum depression, currently pregnant; History of eclampsia; Rh negative state in antepartum period; Pregnancy with history of cesarean section, antepartum; and Gestational diabetes mellitus (GDM) requiring insulin on their problem list.  ----------------------------------------------------------------------------------- Patient reports no complaints.   Contractions: Not present. Vag. Bleeding: None.  Movement: Present. Denies leaking of fluid.  ----------------------------------------------------------------------------------- The following portions of the patient's history were reviewed and updated as appropriate: allergies, current medications, past family history, past medical history, past social history, past surgical history and problem list. Problem list updated.   Objective  Blood pressure 112/74, weight 176 lb (79.8 kg), last menstrual period 05/25/2017. Pregravid weight 186 lb (84.4 kg) Total Weight Gain -10 lb (-4.536 kg) Urinalysis:      Fetal Status: Fetal Heart Rate (bpm): 140 Fundal Height: 24 cm Movement: Present     General:  Alert, oriented and cooperative. Patient is in no acute distress.  Skin: Skin is warm and dry. No rash noted.   Cardiovascular: Normal heart rate noted  Respiratory: Normal respiratory effort, no problems with respiration noted  Abdomen: Soft, gravid, appropriate for gestational age. Pain/Pressure: Absent     Pelvic:  Cervical exam deferred        Extremities: Normal range of motion.     ental Status: Normal mood and affect. Normal behavior. Normal judgment and thought content.   US Ob Comp + 14 Wk  Result Date: 10/01/2017 ULTRASOUND REPORT Patient Name: Denise Walker DOB: 1990/07/09 MRN: [id number] Location: [facility] Date of Service: 10/01/2017 Indications:Anatomy Ultrasound Findings: Mason Jim intrauterine pregnancy is visualized with FHR at 144 BPM. Biometrics give an (U/S) Gestational age of [redacted]w[redacted]d and an (U/S) EDD of 03/04/2018; this correlates with the clinically established Estimated Date of Delivery: 03/01/18 Fetal presentation is Breech. EFW: 8oz/ 221g. Placenta: Anterior, marginal Previa, grade 0. AFI: subjectively normal. Anatomic survey is incomplete for cardiac, diaphragm, nose/lip, trv spine and normal; Gender - female.  Right Ovary is normal in appearance. Left Ovary is normal appearance. Survey of the adnexa demonstrates no adnexal masses. There is no free peritoneal fluid in the cul de sac. Impression: 1. [redacted]w[redacted]d Viable Singleton Intrauterine pregnancy by U/S. 2. (U/S) EDD is consistent with Clinically established Estimated Date of Delivery: 03/01/18 . 3. Normal Anatomy Scan Recommendations: 1.Clinical correlation with the patient's History and Physical Exam. Willette Alma, RDMS, RVT  There is a singleton gestation with subjectively normal amniotic fluid volume. The fetal biometry correlates with established dating. Detailed evaluation of the fetal anatomy was performed.The fetal anatomical survey appears within normal limits within the resolution of ultrasound as described above.  Above noted views not adequately visualized.  It must be noted that a normal ultrasound is unable to rule out fetal aneuploidy.  Vena Austria, MD, [facility], [facility] 10/01/2017, 10:25 AM   US Ob Follow Up  Result Date: 10/29/2017 ULTRASOUND REPORT Location: [facility] Date of Service: 10/29/2017 Patient Name: Denise Walker DOB: 10-14-1990 MRN: [id number] Indications: Anatomy follow up ultrasound Findings: Mason Jim intrauterine pregnancy is visualized with FHR at 143 BPM. Fetal presentation is Transverse. Placenta: anterior. Grade: 0 AFI:  subjectively normal. Anatomic survey is complete. There is no free peritoneal fluid in the cul de sac. Impression: 1. [redacted]w[redacted]d Viable Mason Jim  Intrauterine pregnancy previously established criteria. 2. Normal Anatomy Scan is now complete Recommendations: 1.Clinical correlation with the patient's History and Physical Exam. Mital bahen P Patel There is a singleton gestation with subjectively normal amniotic fluid volume.  Limited evaluation of the fetal anatomy was performed today, focusing on on anatomic structures not fully visualized at the time of prior study.The visualized fetal anatomical survey appears within normal limits within the resolution of ultrasound as described above, and the anatomic survey is now complete.  It must be noted that a normal ultrasound is unable to rule out fetal aneuploidy.  Vena Austria, MD, [facility], [facility] 10/29/2017, 2:12 PM     Assessment   26 y.o. [id number] at [redacted]w[redacted]d by  03/01/2018, by Last Menstrual Period presenting for routine prenatal visit  Plan   Pregnacy#5 Problems (from 05/25/17 to present)    Problem Noted Resolved   Supervision of other normal pregnancy, antepartum 07/17/2017 by Vena Austria, MD No   Priority:  High     Overview Addendum 09/08/2017  1:07 PM by Vena Austria, MD    [facility]inic [facility] Prenatal Labs  Dating LMP = [redacted]w[redacted]d [place] Blood type: O negative  Genetic Screen NIPS: normal XX SMA neg, CF neg, FragileX neg Antibody:negative  Anatomic [place]  Rubella: Equivocal Varicella: Immune  GTT (GDM G1) Early: HgbA1C 5.5, 158 3-hr: 99 / 181 / 131 / 73 RPR: NR  Rhogam [ ]  28 weeks HBsAg: negative  TDaP vaccine                       Flu Shot: HIV: negative  Baby Food                                GBS:   Contraception  Pap: 07/22/17 NIL  CBB     CS/VBAC undecided   Support Person Cody           History of eclampsia 07/22/2017 by Vena Austria, MD No   Priority:  Medium     Overview Addendum 09/03/2017  5:46  PM by Vena Austria, MD    [X]  Aspirin 81 mg daily after 12 weeks; discontinue after 36 weeks  Baseline and surveillance labs (pulled in from [facility], refresh links as needed)  Lab Results  Component Value Date   PLT 292 07/22/2017   CREATININE 0.73 07/22/2017   AST 16 07/22/2017   ALT 13 07/22/2017   PROTCRRATIO 69 07/16/2014           Gestational diabetes mellitus (GDM) requiring insulin 09/08/2017 by Vena Austria, MD No   Overview Addendum 09/24/2017  5:10 PM by Vena Austria, MD    Current Diabetic Medications:  None (was on metformin PCOS)   [X]  Diabetes Education and Testing Supplies [X]  Nutrition Counsult [X]  Fetal ECHO after 22-24 weeks  [ ]  Delivery planning contingent on fetal growth, AFI, glycemic control, and other co-morbidities but at least by 39 weeks  Metformin 500mg  bid  Baseline and surveillance labs (pulled in from [facility], refresh links as needed)  Lab Results  Component Value Date   CREATININE 0.73 07/22/2017   AST 16 07/22/2017   ALT 13 07/22/2017   TSH 2.170 03/27/2017   PROTCRRATIO 1.16 (H) 07/16/2014   Lab Results  Component Value Date   HGBA1C 5.5 07/22/2017    Antenatal Testing Class of DM U/S NST/AFI DELIVERY  Diabetes   A1 - good control -  O24.410    A2 - good control - O24.419      A2  - poor control or poor compliance - O24.419, E11.65   (Macrosomia or polyhydramnios) **E11.65 is extra code for poor control**    A2/B - O24.919  and B-C O24.319  Poor control B-C or D-R-F-T - O24.319  or  Type I DM - O24.019  20-38  20-38  20-24-28-32-36   20-24-28-32-35-38//fetal echo  20-24-27-30-33-36-38//fetal echo  40  32//2 x wk  32//2 x wk   32//2 x wk  28//BPP wkly then 32//2 x wk  40  39  PRN   39  PRN          Rh negative state in antepartum period 08/23/2017 by Vena AustriaStaebler, Jojuan Champney, MD No   Overview Signed 08/23/2017  8:29 PM by Vena AustriaStaebler, Kim Oki, MD    [ ]  Rhogam 28 weeks      Pregnancy with history  of cesarean section, antepartum 08/23/2017 by Vena AustriaStaebler, Everett Ricciardelli, MD No   History of postpartum depression, currently pregnant 07/17/2017 by Vena AustriaStaebler, Shanetra Blumenstock, MD No   History of gestational diabetes in prior pregnancy, currently pregnant 08/23/2017 by Vena AustriaStaebler, Tiannah Greenly, MD 09/08/2017 by Vena AustriaStaebler, Georgeanna Radziewicz, MD       Gestational age appropriate obstetric precautions including but not limited to vaginal bleeding, contractions, leaking of fluid and fetal movement were reviewed in detail with the patient.   - Few random postprandials in low 120's no pattern - Anatomy scan complete  Return in about 1 week (around 11/05/2017) for ROB.  Vena AustriaAndreas Aaminah Forrester, MD, [facility]n CoreFACOG [facility], [facility] 10/29/2017, 2:57 PM

## 2017-11-06 ENCOUNTER — Ambulatory Visit (INDEPENDENT_AMBULATORY_CARE_PROVIDER_SITE_OTHER): Payer: BLUE CROSS/BLUE SHIELD | Admitting: Obstetrics and Gynecology

## 2017-11-06 VITALS — BP 102/58 | Wt 176.0 lb

## 2017-11-06 DIAGNOSIS — O34219 Maternal care for unspecified type scar from previous cesarean delivery: Secondary | ICD-10-CM

## 2017-11-06 DIAGNOSIS — Z8659 Personal history of other mental and behavioral disorders: Secondary | ICD-10-CM

## 2017-11-06 DIAGNOSIS — Z6791 Unspecified blood type, Rh negative: Secondary | ICD-10-CM

## 2017-11-06 DIAGNOSIS — O99891 Other specified diseases and conditions complicating pregnancy: Secondary | ICD-10-CM

## 2017-11-06 DIAGNOSIS — Z8759 Personal history of other complications of pregnancy, childbirth and the puerperium: Secondary | ICD-10-CM

## 2017-11-06 DIAGNOSIS — O26899 Other specified pregnancy related conditions, unspecified trimester: Secondary | ICD-10-CM

## 2017-11-06 DIAGNOSIS — Z3A23 23 weeks gestation of pregnancy: Secondary | ICD-10-CM

## 2017-11-06 DIAGNOSIS — O9989 Other specified diseases and conditions complicating pregnancy, childbirth and the puerperium: Secondary | ICD-10-CM

## 2017-11-06 DIAGNOSIS — O24414 Gestational diabetes mellitus in pregnancy, insulin controlled: Secondary | ICD-10-CM

## 2017-11-06 DIAGNOSIS — O0990 Supervision of high risk pregnancy, unspecified, unspecified trimester: Secondary | ICD-10-CM

## 2017-11-06 NOTE — Progress Notes (Signed)
ROB

## 2017-11-10 NOTE — Progress Notes (Signed)
Routine Prenatal Care Visit  Subjective  Denise Walker is a 27 y.o. G5P1031 at [redacted]w[redacted]d being seen today for ongoing prenatal care.  She is currently monitored for the following issues for this high-risk pregnancy and has Supervision of high risk pregnancy, antepartum; History of postpartum depression, currently pregnant; History of eclampsia; Rh negative state in antepartum period; Pregnancy with history of cesarean section, antepartum; and Gestational diabetes mellitus (GDM) requiring insulin on their problem list.  ----------------------------------------------------------------------------------- Patient reports no complaints.   Contractions: Not present. Vag. Bleeding: None.  Movement: Present. Denies leaking of fluid.  ----------------------------------------------------------------------------------- The following portions of the patient's history were reviewed and updated as appropriate: allergies, current medications, past family history, past medical history, past social history, past surgical history and problem list. Problem list updated.   Objective  Blood pressure (!) 102/58, weight 176 lb (79.8 kg), last menstrual period 05/25/2017. Pregravid weight 186 lb (84.4 kg) Total Weight Gain -10 lb (-4.536 kg) Urinalysis:      Fetal Status: Fetal Heart Rate (bpm): 145 Fundal Height: 24 cm Movement: Present     General:  Alert, oriented and cooperative. Patient is in no acute distress.  Skin: Skin is warm and dry. No rash noted.   Cardiovascular: Normal heart rate noted  Respiratory: Normal respiratory effort, no problems with respiration noted  Abdomen: Soft, gravid, appropriate for gestational age. Pain/Pressure: Absent     Pelvic:  Cervical exam deferred        Extremities: Normal range of motion.     ental Status: Normal mood and affect. Normal behavior. Normal judgment and thought content.   .US Ob Follow Up  Result Date: 10/29/2017 ULTRASOUND REPORT Location: [facility] Date of Service: 10/29/2017 Patient Name: Denise Walker DOB: 01-24-1991 MRN: [id number] Indications: Anatomy follow up ultrasound Findings: Mason Jim intrauterine pregnancy is visualized with FHR at 143 BPM. Fetal presentation is Transverse. Placenta: anterior. Grade: 0 AFI: subjectively normal. Anatomic survey is complete. There is no free peritoneal fluid in the cul de sac. Impression: 1. [redacted]w[redacted]d Viable Singleton Intrauterine pregnancy previously established criteria. 2. Normal Anatomy Scan is now complete Recommendations: 1.Clinical correlation with the patient's History and Physical Exam. Mital bahen P Patel There is a singleton gestation with subjectively normal amniotic fluid volume.  Limited evaluation of the fetal anatomy was performed today, focusing on on anatomic structures not fully visualized at the time of prior study.The visualized fetal anatomical survey appears within normal limits within the resolution of ultrasound as described above, and the anatomic survey is now complete.  It must be noted that a normal ultrasound is unable to rule out fetal aneuploidy.  Vena Austria, MD, [facility], [facility] 10/29/2017, 2:12 PM     Assessment   27 y.o. [id number] at [redacted]w[redacted]d by  03/01/2018, by Last Menstrual Period presenting for routine prenatal visit  Plan   Pregnacy#5 Problems (from 05/25/17 to present)    Problem Noted Resolved   Supervision of high risk pregnancy, antepartum 07/17/2017 by Vena Austria, MD No   Priority:  High     Overview Addendum 10/30/2017  8:21 PM by Vena Austria, MD    Clinic [facility] Prenatal Labs  Dating LMP = [redacted]w[redacted]d [place] Blood type: O negative  Genetic Screen NIPS: normal XX SMA neg, CF neg, FragileX neg Antibody:negative  Anatomic [place] Normal Rubella: Equivocal Varicella: Immune  GTT (GDM G1) Early: HgbA1C 5.5, 158 3-hr: 99 / 181 / 131 / 73 RPR: NR  Rhogam [ ]  28 weeks  HBsAg: negative  TDaP vaccine                  Flu Shot:  10/23/17 HIV: negative  Baby Food                                GBS:   Contraception  Pap: 07/22/17 NIL  CBB     CS/VBAC undecided   Support Person Cody           History of eclampsia 07/22/2017 by Vena Austria, MD No   Priority:  Medium     Overview Addendum 09/03/2017  5:46 PM by Vena Austria, MD    [X]  Aspirin 81 mg daily after 12 weeks; discontinue after 36 weeks  Baseline and surveillance labs (pulled in from [facility], refresh links as needed)  Lab Results  Component Value Date   PLT 292 07/22/2017   CREATININE 0.73 07/22/2017   AST 16 07/22/2017   ALT 13 07/22/2017   PROTCRRATIO 69 07/16/2014           Gestational diabetes mellitus (GDM) requiring insulin 09/08/2017 by Vena Austria, MD No   Overview Addendum 09/24/2017  5:10 PM by Vena Austria, MD    Current Diabetic Medications:  None (was on metformin PCOS)   [X]  Diabetes Education and Testing Supplies [X]  Nutrition Counsult [X]  Fetal ECHO after 22-24 weeks  [ ]  Delivery planning contingent on fetal growth, AFI, glycemic control, and other co-morbidities but at least by 39 weeks  Metformin 500mg  bid  Baseline and surveillance labs (pulled in from [facility], refresh links as needed)  Lab Results  Component Value Date   CREATININE 0.73 07/22/2017   AST 16 07/22/2017   ALT 13 07/22/2017   TSH 2.170 03/27/2017   PROTCRRATIO 1.16 (H) 07/16/2014   Lab Results  Component Value Date   HGBA1C 5.5 07/22/2017    Antenatal Testing Class of DM U/S NST/AFI DELIVERY  Diabetes   A1 - good control - O24.410    A2 - good control - O24.419      A2  - poor control or poor compliance - O24.419, E11.65   (Macrosomia or polyhydramnios) **E11.65 is extra code for poor control**    A2/B - O24.919  and B-C O24.319  Poor control B-C or D-R-F-T - O24.319  or  Type I DM - O24.019  20-38  20-38  20-24-28-32-36   20-24-28-32-35-38//fetal echo  20-24-27-30-33-36-38//fetal echo  40  32//2 x wk  32//2 x  wk   32//2 x wk  28//BPP wkly then 32//2 x wk  40  39  PRN   39  PRN          Rh negative state in antepartum period 08/23/2017 by Vena Austria, MD No   Overview Signed 08/23/2017  8:29 PM by Vena Austria, MD    [ ]  Rhogam 28 weeks      Pregnancy with history of cesarean section, antepartum 08/23/2017 by Vena Austria, MD No   History of postpartum depression, currently pregnant 07/17/2017 by Vena Austria, MD No   History of gestational diabetes in prior pregnancy, currently pregnant 08/23/2017 by Vena Austria, MD 09/08/2017 by Vena Austria, MD       Gestational age appropriate obstetric precautions including but not limited to vaginal bleeding, contractions, leaking of fluid and fetal movement were reviewed in detail with the patient.    - BG at goal no adjustments  Return in about 2 weeks (  around 11/20/2017) for ROB.  Vena Austria, MD, [facility], [facility]

## 2017-11-20 ENCOUNTER — Ambulatory Visit (INDEPENDENT_AMBULATORY_CARE_PROVIDER_SITE_OTHER): Payer: BLUE CROSS/BLUE SHIELD | Admitting: Obstetrics and Gynecology

## 2017-11-20 VITALS — BP 112/62 | Wt 175.0 lb

## 2017-11-20 DIAGNOSIS — O24414 Gestational diabetes mellitus in pregnancy, insulin controlled: Secondary | ICD-10-CM

## 2017-11-20 DIAGNOSIS — O0990 Supervision of high risk pregnancy, unspecified, unspecified trimester: Secondary | ICD-10-CM

## 2017-11-20 DIAGNOSIS — O34219 Maternal care for unspecified type scar from previous cesarean delivery: Secondary | ICD-10-CM

## 2017-11-20 DIAGNOSIS — O26899 Other specified pregnancy related conditions, unspecified trimester: Secondary | ICD-10-CM

## 2017-11-20 DIAGNOSIS — O360120 Maternal care for anti-D [Rh] antibodies, second trimester, not applicable or unspecified: Secondary | ICD-10-CM

## 2017-11-20 DIAGNOSIS — Z6791 Unspecified blood type, Rh negative: Secondary | ICD-10-CM

## 2017-11-20 DIAGNOSIS — Z8759 Personal history of other complications of pregnancy, childbirth and the puerperium: Secondary | ICD-10-CM

## 2017-11-20 DIAGNOSIS — Z3A25 25 weeks gestation of pregnancy: Secondary | ICD-10-CM

## 2017-11-20 DIAGNOSIS — O99891 Other specified diseases and conditions complicating pregnancy: Secondary | ICD-10-CM

## 2017-11-20 DIAGNOSIS — O09292 Supervision of pregnancy with other poor reproductive or obstetric history, second trimester: Secondary | ICD-10-CM

## 2017-11-20 DIAGNOSIS — O9989 Other specified diseases and conditions complicating pregnancy, childbirth and the puerperium: Secondary | ICD-10-CM

## 2017-11-20 DIAGNOSIS — Z8659 Personal history of other mental and behavioral disorders: Secondary | ICD-10-CM

## 2017-11-20 LAB — POCT URINALYSIS DIPSTICK OB: POC,PROTEIN,UA: NEGATIVE

## 2017-11-20 NOTE — Progress Notes (Signed)
ROB

## 2017-11-21 NOTE — Progress Notes (Signed)
Routine Prenatal Care Visit  Subjective  Denise Walker is a 27 y.o. G5P1031 at [redacted]w[redacted]d being seen today for ongoing prenatal care.  She is currently monitored for the following issues for this high-risk pregnancy and has Supervision of high risk pregnancy, antepartum; History of postpartum depression, currently pregnant; History of eclampsia; Rh negative state in antepartum period; Pregnancy with history of cesarean section, antepartum; and Gestational diabetes mellitus (GDM) requiring insulin on their problem list.  ----------------------------------------------------------------------------------- Patient reports no complaints.   Contractions: Not present. Vag. Bleeding: None.  Movement: Present. Denies leaking of fluid.  ----------------------------------------------------------------------------------- The following portions of the patient's history were reviewed and updated as appropriate: allergies, current medications, past family history, past medical history, past social history, past surgical history and problem list. Problem list updated.   Objective  Blood pressure 112/62, weight 175 lb (79.4 kg), last menstrual period 05/25/2017. Pregravid weight 186 lb (84.4 kg) Total Weight Gain -11 lb (-4.99 kg) Urinalysis:      Fetal Status: Fetal Heart Rate (bpm): 140 Fundal Height: 25 cm Movement: Present     General:  Alert, oriented and cooperative. Patient is in no acute distress.  Skin: Skin is warm and dry. No rash noted.   Cardiovascular: Normal heart rate noted  Respiratory: Normal respiratory effort, no problems with respiration noted  Abdomen: Soft, gravid, appropriate for gestational age. Pain/Pressure: Absent     Pelvic:  Cervical exam deferred        Extremities: Normal range of motion.     ental Status: Normal mood and affect. Normal behavior. Normal judgment and thought content.   Date Fasting Breakfast Lunch Dinner  9/30 87 107 122 139  10/1 94 120 117 101  10/2 86  99 113 131  10/3 Didn't check     10/4 87 92 101 124  10/5 92 106 118 115  10/6 93 97 95 102  10/7 91 102 108 99  10/8 89 105 109 116  10/9 88 98 127 112  10/10 91       Assessment   27 y.o. G5P1031 at [redacted]w[redacted]d by  03/01/2018, by Last Menstrual Period presenting for routine prenatal visit  Plan   Pregnacy#5 Problems (from 05/25/17 to present)    Problem Noted Resolved   Supervision of high risk pregnancy, antepartum 07/17/2017 by Vena Austria, MD No   Priority:  High     Overview Addendum 10/30/2017  8:21 PM by Vena Austria, MD    Clinic [facility] Prenatal Labs  Dating LMP = [redacted]w[redacted]d [place] Blood type: O negative  Genetic Screen NIPS: normal XX SMA neg, CF neg, FragileX neg Antibody:negative  Anatomic [place] Normal Rubella: Equivocal Varicella: Immune  GTT (GDM G1) Early: HgbA1C 5.5, 158 3-hr: 99 / 181 / 131 / 73 RPR: NR  Rhogam [ ]  28 weeks HBsAg: negative  TDaP vaccine                  Flu Shot: 10/23/17 HIV: negative  Baby Food                                GBS:   Contraception  Pap: 07/22/17 NIL  CBB     CS/VBAC undecided   Support Person Cody           History of eclampsia 07/22/2017 by Vena Austria, MD No   Priority:  Medium     Overview Addendum 09/03/2017  5:46 PM  by Vena Austria, MD    [X]  Aspirin 81 mg daily after 12 weeks; discontinue after 36 weeks  Baseline and surveillance labs (pulled in from [facility], refresh links as needed)  Lab Results  Component Value Date   PLT 292 07/22/2017   CREATININE 0.73 07/22/2017   AST 16 07/22/2017   ALT 13 07/22/2017   PROTCRRATIO 69 07/16/2014           Gestational diabetes mellitus (GDM) requiring insulin 09/08/2017 by Vena Austria, MD No   Overview Addendum 09/24/2017  5:10 PM by Vena Austria, MD    Current Diabetic Medications:  None (was on metformin PCOS)   [X]  Diabetes Education and Testing Supplies [X]  Nutrition Counsult [X]  Fetal ECHO after 22-24 weeks  [ ]  Delivery planning contingent on  fetal growth, AFI, glycemic control, and other co-morbidities but at least by 39 weeks  Metformin 500mg  bid  Baseline and surveillance labs (pulled in from [facility], refresh links as needed)  Lab Results  Component Value Date   CREATININE 0.73 07/22/2017   AST 16 07/22/2017   ALT 13 07/22/2017   TSH 2.170 03/27/2017   PROTCRRATIO 1.16 (H) 07/16/2014   Lab Results  Component Value Date   HGBA1C 5.5 07/22/2017    Antenatal Testing Class of DM U/S NST/AFI DELIVERY  Diabetes   A1 - good control - O24.410    A2 - good control - O24.419      A2  - poor control or poor compliance - O24.419, E11.65   (Macrosomia or polyhydramnios) **E11.65 is extra code for poor control**    A2/B - O24.919  and B-C O24.319  Poor control B-C or D-R-F-T - O24.319  or  Type I DM - O24.019  20-38  20-38  20-24-28-32-36   20-24-28-32-35-38//fetal echo  20-24-27-30-33-36-38//fetal echo  40  32//2 x wk  32//2 x wk   32//2 x wk  28//BPP wkly then 32//2 x wk  40  39  PRN   39  PRN          Rh negative state in antepartum period 08/23/2017 by Vena Austria, MD No   Overview Signed 08/23/2017  8:29 PM by Vena Austria, MD    [ ]  Rhogam 28 weeks      Pregnancy with history of cesarean section, antepartum 08/23/2017 by Vena Austria, MD No   History of postpartum depression, currently pregnant 07/17/2017 by Vena Austria, MD No   History of gestational diabetes in prior pregnancy, currently pregnant 08/23/2017 by Vena Austria, MD 09/08/2017 by Vena Austria, MD       Gestational age appropriate obstetric precautions including but not limited to vaginal bleeding, contractions, leaking of fluid and fetal movement were reviewed in detail with the patient.    - BG at goal - Maintain Levemir at 20U QHS and Aspart 2 units with meals  Return in about 2 weeks (around 12/04/2017) for ROB and growth.  Vena Austria, MD, [facility], [facility]

## 2017-11-28 NOTE — Telephone Encounter (Signed)
Please advise 

## 2017-11-28 NOTE — Telephone Encounter (Signed)
Could be hives, or contact dermatitis.  OK for Benadryl, Zyrtec, Benadryl lotion, etc.  See Dr Bonney Aid since he is her private physician next week.

## 2017-12-04 ENCOUNTER — Ambulatory Visit (INDEPENDENT_AMBULATORY_CARE_PROVIDER_SITE_OTHER): Payer: BLUE CROSS/BLUE SHIELD | Admitting: Obstetrics and Gynecology

## 2017-12-04 ENCOUNTER — Ambulatory Visit (INDEPENDENT_AMBULATORY_CARE_PROVIDER_SITE_OTHER): Payer: BLUE CROSS/BLUE SHIELD

## 2017-12-04 VITALS — BP 100/62 | Wt 176.0 lb

## 2017-12-04 DIAGNOSIS — O0990 Supervision of high risk pregnancy, unspecified, unspecified trimester: Secondary | ICD-10-CM | POA: Diagnosis not present

## 2017-12-04 DIAGNOSIS — O34219 Maternal care for unspecified type scar from previous cesarean delivery: Secondary | ICD-10-CM

## 2017-12-04 DIAGNOSIS — Z362 Encounter for other antenatal screening follow-up: Secondary | ICD-10-CM | POA: Diagnosis not present

## 2017-12-04 DIAGNOSIS — Z6791 Unspecified blood type, Rh negative: Secondary | ICD-10-CM

## 2017-12-04 DIAGNOSIS — L299 Pruritus, unspecified: Secondary | ICD-10-CM

## 2017-12-04 DIAGNOSIS — O24414 Gestational diabetes mellitus in pregnancy, insulin controlled: Secondary | ICD-10-CM | POA: Diagnosis not present

## 2017-12-04 DIAGNOSIS — Z3A27 27 weeks gestation of pregnancy: Secondary | ICD-10-CM

## 2017-12-04 DIAGNOSIS — O26899 Other specified pregnancy related conditions, unspecified trimester: Secondary | ICD-10-CM

## 2017-12-04 DIAGNOSIS — O360120 Maternal care for anti-D [Rh] antibodies, second trimester, not applicable or unspecified: Secondary | ICD-10-CM

## 2017-12-04 DIAGNOSIS — Z8759 Personal history of other complications of pregnancy, childbirth and the puerperium: Secondary | ICD-10-CM

## 2017-12-04 DIAGNOSIS — O09292 Supervision of pregnancy with other poor reproductive or obstetric history, second trimester: Secondary | ICD-10-CM

## 2017-12-04 DIAGNOSIS — O9989 Other specified diseases and conditions complicating pregnancy, childbirth and the puerperium: Secondary | ICD-10-CM

## 2017-12-04 DIAGNOSIS — Z8659 Personal history of other mental and behavioral disorders: Secondary | ICD-10-CM

## 2017-12-04 DIAGNOSIS — O99891 Other specified diseases and conditions complicating pregnancy: Secondary | ICD-10-CM

## 2017-12-04 LAB — POCT URINALYSIS DIPSTICK OB
Glucose, UA: NEGATIVE
POC,PROTEIN,UA: NEGATIVE

## 2017-12-04 MED ORDER — TRIAMCINOLONE ACETONIDE 0.025 % EX LOTN: 1.0000 "application " | TOPICAL_LOTION | Freq: Two times a day (BID) | CUTANEOUS | 0 refills | Status: DC

## 2017-12-04 MED ORDER — RHO D IMMUNE GLOBULIN 1500 UNIT/2ML IJ SOSY
300.0000 ug | PREFILLED_SYRINGE | Freq: Once | INTRAMUSCULAR | Status: AC
  Administered 2017-12-04: 300 ug via INTRAMUSCULAR

## 2017-12-04 NOTE — Progress Notes (Signed)
Routine Prenatal Care Visit  Subjective  Denise Walker is a 27 y.o. G5P1031 at [redacted]w[redacted]d being seen today for ongoing prenatal care.  She is currently monitored for the following issues for this high-risk pregnancy and has Supervision of high risk pregnancy, antepartum; History of postpartum depression, currently pregnant; History of eclampsia; Rh negative state in antepartum period; Pregnancy with history of cesarean section, antepartum; and Gestational diabetes mellitus (GDM) requiring insulin on their problem list.  ----------------------------------------------------------------------------------- Patient reports no complaints.   Contractions: Not present. Vag. Bleeding: None.  Movement: Present. Denies leaking of fluid.  ----------------------------------------------------------------------------------- The following portions of the patient's history were reviewed and updated as appropriate: allergies, current medications, past family history, past medical history, past social history, past surgical history and problem list. Problem list updated.   Objective  Blood pressure 100/62, weight 176 lb (79.8 kg), last menstrual period 05/25/2017. Pregravid weight 186 lb (84.4 kg) Total Weight Gain -10 lb (-4.536 kg) Urinalysis:      Fetal Status: Fetal Heart Rate (bpm): 140   Movement: Present     General:  Alert, oriented and cooperative. Patient is in no acute distress.  Skin: Skin is warm and dry. No rash noted.   Cardiovascular: Normal heart rate noted  Respiratory: Normal respiratory effort, no problems with respiration noted  Abdomen: Soft, gravid, appropriate for gestational age. Pain/Pressure: Absent     Pelvic:  Cervical exam deferred        Extremities: Normal range of motion.     ental Status: Normal mood and affect. Normal behavior. Normal judgment and thought content.   US Ob Follow Up  Result Date: 12/04/2017 Patient Name: Denise Walker DOB: Feb 05, 1991 MRN: [id number]  ULTRASOUND REPORT Location: [facility] Date of Service: 12/04/2017 Indications: Growth Findings: Mason Jim intrauterine pregnancy is visualized with FHR at 134 BPM. Biometrics give an (U/S) Gestational age of [redacted]w[redacted]d and an (U/S) EDD of 03/05/2018; this correlates with the clinically established Estimated Date of Delivery: 03/01/18. Fetal presentation is Cephalic. Placenta: anterior. Grade: 1 AFI: Subjectively normal. Growth percentile is 37.4. EFW: 983g (2lbs 3oz) Impression: 1. [redacted]w[redacted]d Viable Singleton Intrauterine pregnancy previously established criteria. 2. Growth is 37.4 %ile.  Recommendations: 1.Clinical correlation with the patient's History and Physical Exam. Darlina Guys, RDMS RVT There is a singleton gestation with normal amniotic fluid volume. The fetal biometry correlates with established dating.  Limited fetal anatomy was performed.The visualized fetal anatomical survey appears within normal limits within the resolution of ultrasound as described above.  It must be noted that a normal ultrasound is unable to rule out fetal aneuploidy.  Vena Austria, MD, [facility], [facility] 12/04/2017, 10:39 AM   Date Fasting Breakfast Lunch Dinner  10/14 90 113 108 137  10/15 90 116 99 98  10/16 95 102 114 105  10/17 87 96 108 117  10/18 90 97 118 110  10/19 93 125 115 122  10/20 91 111 116 138  10/21 91 108 114 119  10/22 94 112 106 106  10/23 88 102 100 146  10/24 92                   Assessment   27 y.o. G5P1031 at [redacted]w[redacted]d by  03/01/2018, by Last Menstrual Period presenting for routine prenatal visit  Plan   Pregnacy#5 Problems (from 05/25/17 to present)    Problem Noted Resolved   Supervision of high risk pregnancy, antepartum 07/17/2017 by Vena Austria, MD No   Priority:  High     Overview Addendum 10/30/2017  8:21 PM by Vena Austria, MD    Clinic [facility] Prenatal Labs  Dating LMP = [redacted]w[redacted]d [place] Blood type: O negative  Genetic Screen NIPS: normal  XX SMA neg, CF neg, FragileX neg Antibody:negative  Anatomic [place] Normal Rubella: Equivocal Varicella: Immune  GTT (GDM G1) Early: HgbA1C 5.5, 158 3-hr: 99 / 181 / 131 / 73 RPR: NR  Rhogam [ ]  28 weeks HBsAg: negative  TDaP vaccine                  Flu Shot: 10/23/17 HIV: negative  Baby Food                                GBS:   Contraception  Pap: 07/22/17 NIL  CBB     CS/VBAC undecided   Support Person Cody           History of eclampsia 07/22/2017 by Vena Austria, MD No   Priority:  Medium     Overview Addendum 09/03/2017  5:46 PM by Vena Austria, MD    [X]  Aspirin 81 mg daily after 12 weeks; discontinue after 36 weeks  Baseline and surveillance labs (pulled in from [facility], refresh links as needed)  Lab Results  Component Value Date   PLT 292 07/22/2017   CREATININE 0.73 07/22/2017   AST 16 07/22/2017   ALT 13 07/22/2017   PROTCRRATIO 69 07/16/2014           Gestational diabetes mellitus (GDM) requiring insulin 09/08/2017 by Vena Austria, MD No   Overview Addendum 09/24/2017  5:10 PM by Vena Austria, MD    Current Diabetic Medications:  None (was on metformin PCOS)   [X]  Diabetes Education and Testing Supplies [X]  Nutrition Counsult [X]  Fetal ECHO after 22-24 weeks  [ ]  Delivery planning contingent on fetal growth, AFI, glycemic control, and other co-morbidities but at least by 39 weeks  Metformin 500mg  bid  Baseline and surveillance labs (pulled in from [facility], refresh links as needed)  Lab Results  Component Value Date   CREATININE 0.73 07/22/2017   AST 16 07/22/2017   ALT 13 07/22/2017   TSH 2.170 03/27/2017   PROTCRRATIO 1.16 (H) 07/16/2014   Lab Results  Component Value Date   HGBA1C 5.5 07/22/2017    Antenatal Testing Class of DM U/S NST/AFI DELIVERY  Diabetes   A1 - good control - O24.410    A2 - good control - O24.419      A2  - poor control or poor compliance - O24.419, E11.65   (Macrosomia or polyhydramnios) **E11.65 is extra  code for poor control**    A2/B - O24.919  and B-C O24.319  Poor control B-C or D-R-F-T - O24.319  or  Type I DM - O24.019  20-38  20-38  20-24-28-32-36   20-24-28-32-35-38//fetal echo  20-24-27-30-33-36-38//fetal echo  40  32//2 x wk  32//2 x wk   32//2 x wk  28//BPP wkly then 32//2 x wk  40  39  PRN   39  PRN          Rh negative state in antepartum period 08/23/2017 by Vena Austria, MD No   Overview Signed 08/23/2017  8:29 PM by Vena Austria, MD    [ ]  Rhogam 28 weeks      Pregnancy with history of cesarean section, antepartum 08/23/2017 by Vena Austria, MD No   History of postpartum  depression, currently pregnant 07/17/2017 by Vena Austria, MD No   History of gestational diabetes in prior pregnancy, currently pregnant 08/23/2017 by Vena Austria, MD 09/08/2017 by Vena Austria, MD       Gestational age appropriate obstetric precautions including but not limited to vaginal bleeding, contractions, leaking of fluid and fetal movement were reviewed in detail with the patient.   - normal growth on ultrasound today - 28 week labs today - BG at goal no trends but fasting still borderline at time - rhogam today  Return in about 2 weeks (around 12/18/2017) for ROB.  Vena Austria, MD, [facility], [facility] 12/04/2017, 11:06 AM

## 2017-12-04 NOTE — Progress Notes (Signed)
ROB  Growth scan 

## 2017-12-05 LAB — CBC
HEMATOCRIT: 32.6 % — AB (ref 34.0–46.6)
Hemoglobin: 10.6 g/dL — ABNORMAL LOW (ref 11.1–15.9)
MCH: 29.9 pg (ref 26.6–33.0)
MCHC: 32.5 g/dL (ref 31.5–35.7)
MCV: 92 fL (ref 79–97)
Platelets: 197 10*3/uL (ref 150–450)
RBC: 3.54 x10E6/uL — ABNORMAL LOW (ref 3.77–5.28)
RDW: 13 % (ref 12.3–15.4)
WBC: 9.8 10*3/uL (ref 3.4–10.8)

## 2017-12-05 LAB — HEMOGLOBIN A1C
ESTIMATED AVERAGE GLUCOSE: 94 mg/dL
Hgb A1c MFr Bld: 4.9 % (ref 4.8–5.6)

## 2017-12-05 LAB — RPR: RPR Ser Ql: NONREACTIVE

## 2017-12-05 LAB — HIV ANTIBODY (ROUTINE TESTING W REFLEX): HIV Screen 4th Generation wRfx: NONREACTIVE

## 2017-12-09 ENCOUNTER — Other Ambulatory Visit: Payer: Self-pay | Admitting: Obstetrics and Gynecology

## 2017-12-09 LAB — BILE ACIDS, TOTAL: Bile Acids Total: 2.2 umol/L (ref 0.0–10.0)

## 2017-12-09 MED ORDER — FERROUS SULFATE 325 (65 FE) MG PO TABS: 325.0000 mg | ORAL_TABLET | Freq: Every day | ORAL | 4 refills | Status: DC

## 2017-12-18 ENCOUNTER — Ambulatory Visit (INDEPENDENT_AMBULATORY_CARE_PROVIDER_SITE_OTHER): Payer: BLUE CROSS/BLUE SHIELD | Admitting: Obstetrics and Gynecology

## 2017-12-18 ENCOUNTER — Other Ambulatory Visit: Payer: Self-pay

## 2017-12-18 ENCOUNTER — Observation Stay
Admission: EM | Admit: 2017-12-18 | Discharge: 2017-12-18 | Disposition: A | Discharge status: Home / Self Care | Payer: BLUE CROSS/BLUE SHIELD | Attending: Obstetrics & Gynecology | Admitting: Obstetrics & Gynecology

## 2017-12-18 VITALS — BP 127/90 | Wt 176.0 lb

## 2017-12-18 DIAGNOSIS — O163 Unspecified maternal hypertension, third trimester: Principal | ICD-10-CM | POA: Insufficient documentation

## 2017-12-18 DIAGNOSIS — O34219 Maternal care for unspecified type scar from previous cesarean delivery: Secondary | ICD-10-CM

## 2017-12-18 DIAGNOSIS — Z8659 Personal history of other mental and behavioral disorders: Secondary | ICD-10-CM

## 2017-12-18 DIAGNOSIS — O99891 Other specified diseases and conditions complicating pregnancy: Secondary | ICD-10-CM

## 2017-12-18 DIAGNOSIS — Z3A29 29 weeks gestation of pregnancy: Secondary | ICD-10-CM

## 2017-12-18 DIAGNOSIS — O24414 Gestational diabetes mellitus in pregnancy, insulin controlled: Secondary | ICD-10-CM

## 2017-12-18 DIAGNOSIS — O9989 Other specified diseases and conditions complicating pregnancy, childbirth and the puerperium: Secondary | ICD-10-CM

## 2017-12-18 DIAGNOSIS — O26893 Other specified pregnancy related conditions, third trimester: Secondary | ICD-10-CM | POA: Diagnosis not present

## 2017-12-18 DIAGNOSIS — R030 Elevated blood-pressure reading, without diagnosis of hypertension: Secondary | ICD-10-CM | POA: Diagnosis not present

## 2017-12-18 DIAGNOSIS — O0990 Supervision of high risk pregnancy, unspecified, unspecified trimester: Secondary | ICD-10-CM

## 2017-12-18 DIAGNOSIS — Z6791 Unspecified blood type, Rh negative: Secondary | ICD-10-CM

## 2017-12-18 DIAGNOSIS — Z8759 Personal history of other complications of pregnancy, childbirth and the puerperium: Secondary | ICD-10-CM | POA: Diagnosis not present

## 2017-12-18 DIAGNOSIS — O26899 Other specified pregnancy related conditions, unspecified trimester: Secondary | ICD-10-CM

## 2017-12-18 DIAGNOSIS — O360130 Maternal care for anti-D [Rh] antibodies, third trimester, not applicable or unspecified: Secondary | ICD-10-CM

## 2017-12-18 DIAGNOSIS — O09293 Supervision of pregnancy with other poor reproductive or obstetric history, third trimester: Secondary | ICD-10-CM

## 2017-12-18 LAB — POCT URINALYSIS DIPSTICK OB: Glucose, UA: NEGATIVE

## 2017-12-18 LAB — PROTEIN / CREATININE RATIO, URINE
Creatinine, Urine: 121 mg/dL
PROTEIN CREATININE RATIO: 0.1 mg/mg{creat} (ref 0.00–0.15)
Total Protein, Urine: 12 mg/dL

## 2017-12-18 LAB — CBC
HCT: 35.2 % — ABNORMAL LOW (ref 36.0–46.0)
Hemoglobin: 11.3 g/dL — ABNORMAL LOW (ref 12.0–15.0)
MCH: 30.3 pg (ref 26.0–34.0)
MCHC: 32.1 g/dL (ref 30.0–36.0)
MCV: 94.4 fL (ref 80.0–100.0)
PLATELETS: 180 10*3/uL (ref 150–400)
RBC: 3.73 MIL/uL — ABNORMAL LOW (ref 3.87–5.11)
RDW: 14.6 % (ref 11.5–15.5)
WBC: 10.4 10*3/uL (ref 4.0–10.5)
nRBC: 0 % (ref 0.0–0.2)

## 2017-12-18 LAB — COMPREHENSIVE METABOLIC PANEL
ALT: 13 U/L (ref 0–44)
ANION GAP: 5 (ref 5–15)
AST: 19 U/L (ref 15–41)
Albumin: 3 g/dL — ABNORMAL LOW (ref 3.5–5.0)
Alkaline Phosphatase: 71 U/L (ref 38–126)
BILIRUBIN TOTAL: 0.5 mg/dL (ref 0.3–1.2)
BUN: 6 mg/dL (ref 6–20)
CHLORIDE: 108 mmol/L (ref 98–111)
CO2: 24 mmol/L (ref 22–32)
Calcium: 8.5 mg/dL — ABNORMAL LOW (ref 8.9–10.3)
Creatinine, Ser: 0.5 mg/dL (ref 0.44–1.00)
GFR calc Af Amer: >60 mL/min (ref >60)
GFR calc non Af Amer: >60 mL/min (ref >60)
GLUCOSE: 99 mg/dL (ref 70–99)
POTASSIUM: 3.8 mmol/L (ref 3.5–5.1)
SODIUM: 137 mmol/L (ref 135–145)
TOTAL PROTEIN: 6.1 g/dL — AB (ref 6.5–8.1)

## 2017-12-18 NOTE — Progress Notes (Signed)
Routine Prenatal Care Visit  Subjective  Denise Walker is a 27 y.o. G5P1031 at [redacted]w[redacted]d being seen today for ongoing prenatal care.  She is currently monitored for the following issues for this high-risk pregnancy and has Supervision of high risk pregnancy, antepartum; History of postpartum depression, currently pregnant; History of eclampsia; Rh negative state in antepartum period; Pregnancy with history of cesarean section, antepartum; and Gestational diabetes mellitus (GDM) requiring insulin on their problem list.  ----------------------------------------------------------------------------------- Patient reports headache.   Contractions: Not present. Vag. Bleeding: None.  Movement: Present. Denies leaking of fluid.  ----------------------------------------------------------------------------------- The following portions of the patient's history were reviewed and updated as appropriate: allergies, current medications, past family history, past medical history, past social history, past surgical history and problem list. Problem list updated.   Objective  Blood pressure 127/90, weight 176 lb (79.8 kg), last menstrual period 05/25/2017. Pregravid weight 186 lb (84.4 kg) Total Weight Gain -10 lb (-4.536 kg) Urinalysis:      Fetal Status: Fetal Heart Rate (bpm): 140 Fundal Height: 28 cm Movement: Present     General:  Alert, oriented and cooperative. Patient is in no acute distress.  Skin: Skin is warm and dry. No rash noted.   Cardiovascular: Normal heart rate noted  Respiratory: Normal respiratory effort, no problems with respiration noted  Abdomen: Soft, gravid, appropriate for gestational age. Pain/Pressure: Absent     Pelvic:  Cervical exam deferred        Extremities: Normal range of motion.     ental Status: Normal mood and affect. Normal behavior. Normal judgment and thought content.     Assessment   27 y.o. Z6X0960 at [redacted]w[redacted]d by  03/01/2018, by Last Menstrual Period presenting  for routine prenatal visit  Plan   Pregnacy#5 Problems (from 05/25/17 to present)    Problem Noted Resolved   Supervision of high risk pregnancy, antepartum 07/17/2017 by Vena Austria, MD No   Priority:  High     Overview Addendum 12/04/2017 11:00 AM by Vena Austria, MD    [facility] Prenatal Labs  Dating LMP = [redacted]w[redacted]d [place] Blood type: O negative  Genetic Screen NIPS: normal XX SMA neg, CF neg, FragileX neg Antibody:negative  Anatomic [place] Normal Rubella: Equivocal Varicella: Immune  GTT (GDM G1) Early: HgbA1C 5.5, 158 3-hr: 99 / 181 / 131 / 73 RPR: NR  Rhogam 12/04/17 HBsAg: negative  TDaP vaccine                  Flu Shot: 10/23/17 HIV: negative  Baby Food                                GBS:   Contraception  Pap: 07/22/17 NIL  CBB     CS/VBAC C-section [ ]  schedule at 30 weeks   Support Person Cody           History of eclampsia 07/22/2017 by Vena Austria, MD No   Priority:  Medium     Overview Addendum 09/03/2017  5:46 PM by Vena Austria, MD    [X]  Aspirin 81 mg daily after 12 weeks; discontinue after 36 weeks  Baseline and surveillance labs (pulled in from [facility], refresh links as needed)  Lab Results  Component Value Date   PLT 292 07/22/2017   CREATININE 0.73 07/22/2017   AST 16 07/22/2017   ALT 13 07/22/2017   PROTCRRATIO 69 07/16/2014  Gestational diabetes mellitus (GDM) requiring insulin 09/08/2017 by Vena Austria, MD No   Overview Addendum 09/24/2017  5:10 PM by Vena Austria, MD    Current Diabetic Medications:  None (was on metformin PCOS)   [X]  Diabetes Education and Testing Supplies [X]  Nutrition Counsult [X]  Fetal ECHO after 22-24 weeks  [ ]  Delivery planning contingent on fetal growth, AFI, glycemic control, and other co-morbidities but at least by 39 weeks  Metformin 500mg  bid  Baseline and surveillance labs (pulled in from [facility], refresh links as needed)  Lab Results  Component Value Date   CREATININE 0.73  07/22/2017   AST 16 07/22/2017   ALT 13 07/22/2017   TSH 2.170 03/27/2017   PROTCRRATIO 1.16 (H) 07/16/2014   Lab Results  Component Value Date   HGBA1C 5.5 07/22/2017    Antenatal Testing Class of DM U/S NST/AFI DELIVERY  Diabetes   A1 - good control - O24.410    A2 - good control - O24.419      A2  - poor control or poor compliance - O24.419, E11.65   (Macrosomia or polyhydramnios) **E11.65 is extra code for poor control**    A2/B - O24.919  and B-C O24.319  Poor control B-C or D-R-F-T - O24.319  or  Type I DM - O24.019  20-38  20-38  20-24-28-32-36   20-24-28-32-35-38//fetal echo  20-24-27-30-33-36-38//fetal echo  40  32//2 x wk  32//2 x wk   32//2 x wk  28//BPP wkly then 32//2 x wk  40  39  PRN   39  PRN          Rh negative state in antepartum period 08/23/2017 by Vena Austria, MD No   Overview Signed 08/23/2017  8:29 PM by Vena Austria, MD    [ ]  Rhogam 28 weeks      Pregnancy with history of cesarean section, antepartum 08/23/2017 by Vena Austria, MD No   History of postpartum depression, currently pregnant 07/17/2017 by Vena Austria, MD No   History of gestational diabetes in prior pregnancy, currently pregnant 08/23/2017 by Vena Austria, MD 09/08/2017 by Vena Austria, MD       Gestational age appropriate obstetric precautions including but not limited to vaginal bleeding, contractions, leaking of fluid and fetal movement were reviewed in detail with the patient.   - BG remain at goal - Mild elevation in diastolic BP, has had intermittent but self limited headaches.  Given option of labs and repeat BP check tomorrow vs L&D evaluation.  She is understandably nervous given history of eclampsia and preferrs L&D evaluation.  Return in about 1 day (around 12/19/2017) for BP check tomorrow, ROB and growth scan 2 weeks.  Vena Austria, MD, Evern Core [facility], [facility] 12/18/2017, 11:24  AM

## 2017-12-18 NOTE — Final Progress Note (Signed)
Physician Final Progress Note  Patient ID: Denise Walker MRN: [id number] DOB/AGE: 1990/09/25 27 y.o.  Admit date: 12/18/2017 Admitting provider: Nadara Mustard, MD Discharge date: 12/18/2017  Admission Diagnoses: High Blood Pressure  Discharge Diagnoses: High Blood Pressure, Pregnancy 29 weeks  Consults: None  Significant Findings/ Diagnostic Studies: Pt seen and examined fro concerns over elevated BP in office today,  No CP, SOB, blurry vision, epigastric pain, edema.  Has occas headache. Exam and labs today do not show any concerning signs for preclampsia.  Procedures: A NST procedure was performed with FHR monitoring and a normal baseline established, appropriate time of 20-40 minutes of evaluation, and accels >2 seen w 10x10 characteristics.  Results show a REACTIVE NST.   Discharge Condition: good  Disposition: Discharge disposition: 01-Home or Self Care       Diet: Regular diet  Discharge Activity: Activity as tolerated  Discharge Instructions    Call MD for:   Complete by:  As directed    Worsening contractions or pain; leakage of fluid; bleeding.   Diet general   Complete by:  As directed    Increase activity slowly   Complete by:  As directed      Allergies as of 12/18/2017      Reactions   Fish Allergy Shortness Of Breath      Medication List    TAKE these medications   ACCU-CHEK FASTCLIX LANCETS Misc 1 Units by Percutaneous route 4 (four) times daily.   aspirin EC 81 MG tablet Take 81 mg by mouth daily.   escitalopram 10 MG tablet Commonly known as:  LEXAPRO Take 1 tablet (10 mg total) by mouth daily.   ferrous sulfate 325 (65 FE) MG tablet Take 1 tablet (325 mg total) by mouth daily.   fluticasone 50 MCG/ACT nasal spray Commonly known as:  FLONASE 2 sprays by Each Nare route Two (2) times a day.   glucose blood test strip Use as instructed   insulin aspart 100 UNIT/ML FlexPen Commonly known as:  NOVOLOG Inject 2 Units into the skin 3  (three) times daily with meals.   insulin detemir 100 UNIT/ML injection Commonly known as:  LEVEMIR Inject 0.2 mLs (20 Units total) into the skin at bedtime.   Insulin Pen Needle 32G X 6 MM Misc Use as directed   Insulin Syringe-Needle U-100 29G 0.5 ML Misc Use as directed   metFORMIN 500 MG tablet Commonly known as:  GLUCOPHAGE Take 1 tablet (500 mg total) by mouth 2 (two) times daily.   PRENATAL 1 PO Take 1 tablet by mouth daily.   Triamcinolone Acetonide 0.025 % Lotn Apply 1 application topically 2 (two) times daily.      Total time spent taking care of this patient:  15 minutes  Signed: Letitia Libra 12/18/2017, 2:51 PM

## 2017-12-18 NOTE — Discharge Summary (Signed)
  See FPN 

## 2017-12-18 NOTE — OB Triage Note (Signed)
Ms. Plunk sent over from office for blood pressure evaluation following an elevated BP reading in office today. Denies headache at this time, reports floaters, denies epigastric pain or unusual swelling. Reports positive fetal movement.

## 2017-12-19 ENCOUNTER — Telehealth: Payer: Self-pay | Admitting: Obstetrics and Gynecology

## 2017-12-19 ENCOUNTER — Ambulatory Visit (INDEPENDENT_AMBULATORY_CARE_PROVIDER_SITE_OTHER): Payer: BLUE CROSS/BLUE SHIELD | Admitting: Obstetrics and Gynecology

## 2017-12-19 VITALS — BP 100/56

## 2017-12-19 DIAGNOSIS — Z3A29 29 weeks gestation of pregnancy: Secondary | ICD-10-CM

## 2017-12-19 DIAGNOSIS — Z6791 Unspecified blood type, Rh negative: Secondary | ICD-10-CM

## 2017-12-19 DIAGNOSIS — O26899 Other specified pregnancy related conditions, unspecified trimester: Secondary | ICD-10-CM

## 2017-12-19 DIAGNOSIS — O0990 Supervision of high risk pregnancy, unspecified, unspecified trimester: Secondary | ICD-10-CM

## 2017-12-19 DIAGNOSIS — O24414 Gestational diabetes mellitus in pregnancy, insulin controlled: Secondary | ICD-10-CM

## 2017-12-19 DIAGNOSIS — O26893 Other specified pregnancy related conditions, third trimester: Secondary | ICD-10-CM

## 2017-12-19 DIAGNOSIS — O34219 Maternal care for unspecified type scar from previous cesarean delivery: Secondary | ICD-10-CM

## 2017-12-19 DIAGNOSIS — Z8759 Personal history of other complications of pregnancy, childbirth and the puerperium: Secondary | ICD-10-CM

## 2017-12-19 DIAGNOSIS — O09293 Supervision of pregnancy with other poor reproductive or obstetric history, third trimester: Secondary | ICD-10-CM

## 2017-12-19 LAB — PROTEIN / CREATININE RATIO, URINE
CREATININE, UR: 162 mg/dL
Protein, Ur: 35.1 mg/dL
Protein/Creat Ratio: 217 mg/g creat — ABNORMAL HIGH (ref 0–200)

## 2017-12-19 NOTE — Progress Notes (Signed)
ROB/B/P check 

## 2017-12-19 NOTE — Telephone Encounter (Signed)
Patient is aware of H&P at [facility] on 02/23/18 @ 8:50am, Pre-admit Testing afterwards, and OR on 02/24/18.

## 2017-12-19 NOTE — Telephone Encounter (Signed)
-----   Message from Vena Austria, MD sent at 12/18/2017 11:04 AM EST ----- Regarding: C-section Surgery Date:   LOS: surgery admit  Surgery Booking Request Patient Full Name: Denise Walker MRN: [id number]  DOB: 03-07-1990  Surgeon: Vena Austria, MD  Requested Surgery Date and Time: 02/24/18 Primary Diagnosis and Code: Prior C-section Secondary Diagnosis and Code:  Surgical Procedure: Cesarean Section L&D Notification:yes Admission Status: surgery admit Length of Surgery: 1hr Special Case Needs: none H&P: day prior (date) Phone Interview or Office Pre-Admit: pre-admit Interpreter: No Language: English Medical Clearance: No Special Scheduling Instructions: none

## 2017-12-25 ENCOUNTER — Ambulatory Visit (INDEPENDENT_AMBULATORY_CARE_PROVIDER_SITE_OTHER): Payer: BLUE CROSS/BLUE SHIELD | Admitting: Obstetrics and Gynecology

## 2017-12-25 VITALS — BP 100/63 | Wt 176.0 lb

## 2017-12-25 DIAGNOSIS — O34219 Maternal care for unspecified type scar from previous cesarean delivery: Secondary | ICD-10-CM

## 2017-12-25 DIAGNOSIS — Z8759 Personal history of other complications of pregnancy, childbirth and the puerperium: Secondary | ICD-10-CM

## 2017-12-25 DIAGNOSIS — O26893 Other specified pregnancy related conditions, third trimester: Secondary | ICD-10-CM

## 2017-12-25 DIAGNOSIS — Z3A30 30 weeks gestation of pregnancy: Secondary | ICD-10-CM

## 2017-12-25 DIAGNOSIS — O26899 Other specified pregnancy related conditions, unspecified trimester: Secondary | ICD-10-CM

## 2017-12-25 DIAGNOSIS — O0990 Supervision of high risk pregnancy, unspecified, unspecified trimester: Secondary | ICD-10-CM

## 2017-12-25 DIAGNOSIS — O24414 Gestational diabetes mellitus in pregnancy, insulin controlled: Secondary | ICD-10-CM

## 2017-12-25 DIAGNOSIS — O09293 Supervision of pregnancy with other poor reproductive or obstetric history, third trimester: Secondary | ICD-10-CM

## 2017-12-25 DIAGNOSIS — Z6791 Unspecified blood type, Rh negative: Secondary | ICD-10-CM

## 2017-12-25 LAB — POCT URINALYSIS DIPSTICK OB
Glucose, UA: NEGATIVE
PROTEIN: NEGATIVE

## 2017-12-25 NOTE — Progress Notes (Signed)
ROB

## 2017-12-25 NOTE — Progress Notes (Signed)
Routine Prenatal Care Visit  Subjective  Denise Walker is a 27 y.o. G5P1031 at [redacted]w[redacted]d being seen today for ongoing prenatal care.  She is currently monitored for the following issues for this high-risk pregnancy and has Supervision of high risk pregnancy, antepartum; History of eclampsia; Rh negative state in antepartum period; Pregnancy with history of cesarean section, antepartum; and Gestational diabetes mellitus (GDM) requiring insulin on their problem list.  ----------------------------------------------------------------------------------- Patient reports no complaints.   Contractions: Not present. Vag. Bleeding: None.  Movement: Present. Denies leaking of fluid.  ----------------------------------------------------------------------------------- The following portions of the patient's history were reviewed and updated as appropriate: allergies, current medications, past family history, past medical history, past social history, past surgical history and problem list. Problem list updated.   Objective  Blood pressure 100/63, weight 176 lb (79.8 kg), last menstrual period 05/25/2017. Pregravid weight 186 lb (84.4 kg) Total Weight Gain -10 lb (-4.536 kg) Urinalysis:      Fetal Status: Fetal Heart Rate (bpm): 140   Movement: Present     General:  Alert, oriented and cooperative. Patient is in no acute distress.  Skin: Skin is warm and dry. No rash noted.   Cardiovascular: Normal heart rate noted  Respiratory: Normal respiratory effort, no problems with respiration noted  Abdomen: Soft, gravid, appropriate for gestational age. Pain/Pressure: Absent     Pelvic:  Cervical exam deferred        Extremities: Normal range of motion.     ental Status: Normal mood and affect. Normal behavior. Normal judgment and thought content.     Assessment   27 y.o. Z6X0960 at [redacted]w[redacted]d by  03/01/2018, by Last Menstrual Period presenting for routine prenatal visit  Plan   Pregnacy#5 Problems (from  05/25/17 to present)    Problem Noted Resolved   Supervision of high risk pregnancy, antepartum 07/17/2017 by Vena Austria, MD No   Priority:  High     Overview Addendum 12/04/2017 11:00 AM by Vena Austria, MD    [facility] Prenatal Labs  Dating LMP = [redacted]w[redacted]d [place] Blood type: O negative  Genetic Screen NIPS: normal XX SMA neg, CF neg, FragileX neg Antibody:negative  Anatomic [place] Normal Rubella: Equivocal Varicella: Immune  GTT (GDM G1) Early: HgbA1C 5.5, 158 3-hr: 99 / 181 / 131 / 73 RPR: NR  Rhogam 12/04/17 HBsAg: negative  TDaP vaccine                  Flu Shot: 10/23/17 HIV: negative  Baby Food                                GBS:   Contraception  Pap: 07/22/17 NIL  CBB     CS/VBAC C-section [ ]  schedule at 30 weeks   Support Person Cody           History of eclampsia 07/22/2017 by Vena Austria, MD No   Priority:  Medium     Overview Addendum 09/03/2017  5:46 PM by Vena Austria, MD    [X]  Aspirin 81 mg daily after 12 weeks; discontinue after 36 weeks  Baseline and surveillance labs (pulled in from [facility], refresh links as needed)  Lab Results  Component Value Date   PLT 292 07/22/2017   CREATININE 0.73 07/22/2017   AST 16 07/22/2017   ALT 13 07/22/2017   PROTCRRATIO 69 07/16/2014           Gestational diabetes mellitus (GDM) requiring  insulin 09/08/2017 by Vena AustriaStaebler, Mychelle Kendra, MD No   Overview Addendum 09/24/2017  5:10 PM by Vena AustriaStaebler, Zay Yeargan, MD    Current Diabetic Medications:  None (was on metformin PCOS)   [X]  Diabetes Education and Testing Supplies [X]  Nutrition Counsult [X]  Fetal ECHO after 22-24 weeks  [ ]  Delivery planning contingent on fetal growth, AFI, glycemic control, and other co-morbidities but at least by 39 weeks  Metformin 500mg  bid  Baseline and surveillance labs [facility], refresh links as needed)  Lab Results  Component Value Date   CREATININE 0.73 07/22/2017   AST 16 07/22/2017   ALT 13 07/22/2017   TSH 2.170  03/27/2017   PROTCRRATIO 1.16 (H) 07/16/2014   Lab Results  Component Value Date   HGBA1C 5.5 07/22/2017    Antenatal Testing Class of DM U/S NST/AFI DELIVERY  Diabetes   A1 - good control - O24.410    A2 - good control - O24.419      A2  - poor control or poor compliance - O24.419, E11.65   (Macrosomia or polyhydramnios) **E11.65 is extra code for poor control**    A2/B - O24.919  and B-C O24.319  Poor control B-C or D-R-F-T - O24.319  or  Type I DM - O24.019  20-38  20-38  20-24-28-32-36   20-24-28-32-35-38//fetal echo  20-24-27-30-33-36-38//fetal echo  40  32//2 x wk  32//2 x wk   32//2 x wk  28//BPP wkly then 32//2 x wk  40  39  PRN   39  PRN          Rh negative state in antepartum period 08/23/2017 by Vena AustriaStaebler, Geovana Gebel, MD No   Overview Signed 08/23/2017  8:29 PM by Vena AustriaStaebler, Joseandres Mazer, MD    [X]  Rhogam 28 weeks      Pregnancy with history of cesarean section, antepartum 08/23/2017 by Vena AustriaStaebler, Georgie Haque, MD No   History of gestational diabetes in prior pregnancy, currently pregnant 08/23/2017 by Vena AustriaStaebler, Brieann Osinski, MD 09/08/2017 by Vena AustriaStaebler, Mikeya Tomasetti, MD       Gestational age appropriate obstetric precautions including but not limited to vaginal bleeding, contractions, leaking of fluid and fetal movement were reviewed in detail with the patient.    - growth scan next visit - BG elevated values in AM usually with lower values in PM, states 1-2AM BG sometime in 60s. Discussed adding small bedtime snack  Return in about 2 weeks (around 01/08/2018) for ROB, growth scan, NST.  Vena AustriaAndreas Eevee Borbon, MD, Evern CoreFACOG [facility], [facility] 12/25/2017, 2:38 PM

## 2017-12-31 ENCOUNTER — Ambulatory Visit (INDEPENDENT_AMBULATORY_CARE_PROVIDER_SITE_OTHER): Payer: BLUE CROSS/BLUE SHIELD

## 2017-12-31 ENCOUNTER — Ambulatory Visit (INDEPENDENT_AMBULATORY_CARE_PROVIDER_SITE_OTHER): Payer: BLUE CROSS/BLUE SHIELD | Admitting: Obstetrics and Gynecology

## 2017-12-31 VITALS — BP 104/68 | Wt 178.0 lb

## 2017-12-31 DIAGNOSIS — O0990 Supervision of high risk pregnancy, unspecified, unspecified trimester: Secondary | ICD-10-CM

## 2017-12-31 DIAGNOSIS — Z3A31 31 weeks gestation of pregnancy: Secondary | ICD-10-CM

## 2017-12-31 DIAGNOSIS — O34219 Maternal care for unspecified type scar from previous cesarean delivery: Secondary | ICD-10-CM

## 2017-12-31 DIAGNOSIS — O09293 Supervision of pregnancy with other poor reproductive or obstetric history, third trimester: Secondary | ICD-10-CM

## 2017-12-31 DIAGNOSIS — Z8759 Personal history of other complications of pregnancy, childbirth and the puerperium: Secondary | ICD-10-CM

## 2017-12-31 DIAGNOSIS — O99891 Other specified diseases and conditions complicating pregnancy: Secondary | ICD-10-CM

## 2017-12-31 DIAGNOSIS — Z362 Encounter for other antenatal screening follow-up: Secondary | ICD-10-CM

## 2017-12-31 DIAGNOSIS — O24414 Gestational diabetes mellitus in pregnancy, insulin controlled: Secondary | ICD-10-CM | POA: Diagnosis not present

## 2017-12-31 DIAGNOSIS — Z8659 Personal history of other mental and behavioral disorders: Secondary | ICD-10-CM

## 2017-12-31 DIAGNOSIS — O9989 Other specified diseases and conditions complicating pregnancy, childbirth and the puerperium: Secondary | ICD-10-CM

## 2017-12-31 LAB — POCT URINALYSIS DIPSTICK OB: GLUCOSE, UA: NEGATIVE

## 2017-12-31 NOTE — Progress Notes (Signed)
Routine Prenatal Care Visit  Subjective  Denise Walker is a 27 y.o. G5P1031 at 6678w3d being seen today for ongoing prenatal care.  She is currently monitored for the following issues for this high-risk pregnancy and has Supervision of high risk pregnancy, antepartum; History of eclampsia; Rh negative state in antepartum period; Pregnancy with history of cesarean section, antepartum; and Gestational diabetes mellitus (GDM) requiring insulin on their problem list.  ----------------------------------------------------------------------------------- Patient reports no complaints.   Contractions: Not present. Vag. Bleeding: None.  Movement: Present. Denies leaking of fluid.  ----------------------------------------------------------------------------------- The following portions of the patient's history were reviewed and updated as appropriate: allergies, current medications, past family history, past medical history, past social history, past surgical history and problem list. Problem list updated.   Objective  Blood pressure 104/68, weight 178 lb (80.7 kg), last menstrual period 05/25/2017. Pregravid weight 186 lb (84.4 kg) Total Weight Gain -8 lb (-3.629 kg) Urinalysis:      Fetal Status: Fetal Heart Rate (bpm): 150   Movement: Present     General:  Alert, oriented and cooperative. Patient is in no acute distress.  Skin: Skin is warm and dry. No rash noted.   Cardiovascular: Normal heart rate noted  Respiratory: Normal respiratory effort, no problems with respiration noted  Abdomen: Soft, gravid, appropriate for gestational age. Pain/Pressure: Absent     Pelvic:  Cervical exam deferred        Extremities: Normal range of motion.     ental Status: Normal mood and affect. Normal behavior. Normal judgment and thought content.   [place] Ob Follow Up  Result Date: 01/01/2018 Patient Name: Denise Walker DOB: 28-Apr-1990 MRN: [id number] ULTRASOUND REPORT Location: [facility] Date of Service:  12/31/2017 Indications: Growth Findings: Mason JimSingleton intrauterine pregnancy is visualized with FHR at 153 BPM. Biometrics give an (U/S) Gestational age of 4422w0d and an (U/S) EDD of 05/03/2018; this correlates with the clinically established Estimated Date of Delivery: 03/01/18. Fetal presentation is Cephalic. Placenta: anterior. Grade: 1 AFI: Subjectively normal. Growth percentile is 42.5. EFW: 1,701g (3lbs 12oz) Impression: 1. 6578w3d Viable Singleton Intrauterine pregnancy previously established criteria. 2. Growth is 42.5 %ile.  Recommendations: 1.Clinical correlation with the patient's History and Physical Exam. Darlina GuysAbby M Clarke, RDMS RVT There is a singleton gestation with normal amniotic fluid volume. The fetal biometry correlates with established dating.  Limited fetal anatomy was performed.The visualized fetal anatomical survey appears within normal limits within the resolution of ultrasound as described above.  It must be noted that a normal ultrasound is unable to rule out fetal aneuploidy.  Vena AustriaAndreas Kahleah Crass, MD, Merlinda FrederickFACOG [facility], [facility]   [place] Ob Follow Up  Result Date: 12/04/2017 Patient Name: Denise Walker DOB: 28-Apr-1990 MRN: [id number] ULTRASOUND REPORT Location: [facility] Date of Service: 12/04/2017 Indications: Growth Findings: Mason JimSingleton intrauterine pregnancy is visualized with FHR at 134 BPM. Biometrics give an (U/S) Gestational age of 3965w0d and an (U/S) EDD of 03/05/2018; this correlates with the clinically established Estimated Date of Delivery: 03/01/18. Fetal presentation is Cephalic. Placenta: anterior. Grade: 1 AFI: Subjectively normal. Growth percentile is 37.4. EFW: 983g (2lbs 3oz) Impression: 1. 6354w4d Viable Singleton Intrauterine pregnancy previously established criteria. 2. Growth is 37.4 %ile.  Recommendations: 1.Clinical correlation with the patient's History and Physical Exam. Darlina GuysAbby M Clarke, RDMS RVT There is a singleton gestation with normal amniotic fluid  volume. The fetal biometry correlates with established dating.  Limited fetal anatomy was performed.The visualized fetal anatomical survey appears within normal limits within the resolution of  ultrasound as described above.  It must be noted that a normal ultrasound is unable to rule out fetal aneuploidy.  Vena Austria, MD, Evern Core [facility], [facility] 12/04/2017, 10:39 AM     Assessment   27 y.o. Z6X0960 at [redacted]w[redacted]d by  03/01/2018, by Last Menstrual Period presenting for routine prenatal visit  Plan   Pregnacy#5 Problems (from 05/25/17 to present)    Problem Noted Resolved   Supervision of high risk pregnancy, antepartum 07/17/2017 by Vena Austria, MD No   Priority:  High     Overview Addendum 12/04/2017 11:00 AM by Vena Austria, MD    [facility] Prenatal Labs  Dating LMP = [redacted]w[redacted]d [place] Blood type: O negative  Genetic Screen NIPS: normal XX SMA neg, CF neg, FragileX neg Antibody:negative  Anatomic [place] Normal Rubella: Equivocal Varicella: Immune  GTT (GDM G1) Early: HgbA1C 5.5, 158 3-hr: 99 / 181 / 131 / 73 RPR: NR  Rhogam 12/04/17 HBsAg: negative  TDaP vaccine                  Flu Shot: 10/23/17 HIV: negative  Baby Food                                GBS:   Contraception  Pap: 07/22/17 NIL  CBB     CS/VBAC C-section [ ]  schedule at 30 weeks   Support Person Cody           History of eclampsia 07/22/2017 by Vena Austria, MD No   Priority:  Medium     Overview Addendum 09/03/2017  5:46 PM by Vena Austria, MD    [X]  Aspirin 81 mg daily after 12 weeks; discontinue after 36 weeks  Baseline and surveillance labs (pulled in from [facility], refresh links as needed)  Lab Results  Component Value Date   PLT 292 07/22/2017   CREATININE 0.73 07/22/2017   AST 16 07/22/2017   ALT 13 07/22/2017   PROTCRRATIO 69 07/16/2014           Gestational diabetes mellitus (GDM) requiring insulin 09/08/2017 by Vena Austria, MD No   Overview Addendum  09/24/2017  5:10 PM by Vena Austria, MD    Current Diabetic Medications:  None (was on metformin PCOS)   [X]  Diabetes Education and Testing Supplies [X]  Nutrition Counsult [X]  Fetal ECHO after 22-24 weeks  [ ]  Delivery planning contingent on fetal growth, AFI, glycemic control, and other co-morbidities but at least by 39 weeks  Metformin 500mg  bid  Baseline and surveillance labs (pulled in from [facility], refresh links as needed)  Lab Results  Component Value Date   CREATININE 0.73 07/22/2017   AST 16 07/22/2017   ALT 13 07/22/2017   TSH 2.170 03/27/2017   PROTCRRATIO 1.16 (H) 07/16/2014   Lab Results  Component Value Date   HGBA1C 5.5 07/22/2017    Antenatal Testing Class of DM U/S NST/AFI DELIVERY  Diabetes   A1 - good control - O24.410    A2 - good control - O24.419      A2  - poor control or poor compliance - O24.419, E11.65   (Macrosomia or polyhydramnios) **E11.65 is extra code for poor control**    A2/B - O24.919  and B-C O24.319  Poor control B-C or D-R-F-T - O24.319  or  Type I DM - O24.019  20-38  20-38  20-24-28-32-36   20-24-28-32-35-38//fetal echo  20-24-27-30-33-36-38//fetal echo  40  32//2  x wk  32//2 x wk   32//2 x wk  28//BPP wkly then 32//2 x wk  40  39  PRN   39  PRN          Rh negative state in antepartum period 08/23/2017 by Vena Austria, MD No   Overview Addendum 12/25/2017  2:39 PM by Vena Austria, MD    [X]  Rhogam 28 weeks - 12/04/17      Pregnancy with history of cesarean section, antepartum 08/23/2017 by Vena Austria, MD No   History of gestational diabetes in prior pregnancy, currently pregnant 08/23/2017 by Vena Austria, MD 09/08/2017 by Vena Austria, MD       Gestational age appropriate obstetric precautions including but not limited to vaginal bleeding, contractions, leaking of fluid and fetal movement were reviewed in detail with the patient.   - BG still running mostly at goal AM  sugars still occasionally in the upper 90's low 100's despite addition of evening snack - Very appropriate growth and AFI today - Start weekly NST/AFI next week  Return in about 2 weeks (around 01/14/2018) for ROB, NST, AFI.  Vena Austria, MD, Evern Core [facility], [facility] 12/31/2017, 2:26 PM

## 2017-12-31 NOTE — Progress Notes (Signed)
ROB No concerns 

## 2018-01-07 ENCOUNTER — Other Ambulatory Visit: Payer: Self-pay | Admitting: Obstetrics and Gynecology

## 2018-01-07 NOTE — Telephone Encounter (Signed)
Advise

## 2018-01-12 ENCOUNTER — Observation Stay
Admission: EM | Admit: 2018-01-12 | Discharge: 2018-01-12 | Disposition: A | Discharge status: Home / Self Care | Payer: BLUE CROSS/BLUE SHIELD | Attending: Obstetrics and Gynecology | Admitting: Obstetrics and Gynecology

## 2018-01-12 ENCOUNTER — Telehealth: Payer: Self-pay | Admitting: Obstetrics and Gynecology

## 2018-01-12 ENCOUNTER — Other Ambulatory Visit: Payer: Self-pay

## 2018-01-12 DIAGNOSIS — Z794 Long term (current) use of insulin: Secondary | ICD-10-CM | POA: Insufficient documentation

## 2018-01-12 DIAGNOSIS — E282 Polycystic ovarian syndrome: Secondary | ICD-10-CM | POA: Diagnosis not present

## 2018-01-12 DIAGNOSIS — R0981 Nasal congestion: Secondary | ICD-10-CM | POA: Diagnosis not present

## 2018-01-12 DIAGNOSIS — Z79899 Other long term (current) drug therapy: Secondary | ICD-10-CM | POA: Diagnosis not present

## 2018-01-12 DIAGNOSIS — Z8759 Personal history of other complications of pregnancy, childbirth and the puerperium: Secondary | ICD-10-CM

## 2018-01-12 DIAGNOSIS — Z6791 Unspecified blood type, Rh negative: Secondary | ICD-10-CM

## 2018-01-12 DIAGNOSIS — Z3A33 33 weeks gestation of pregnancy: Secondary | ICD-10-CM | POA: Diagnosis not present

## 2018-01-12 DIAGNOSIS — O26893 Other specified pregnancy related conditions, third trimester: Secondary | ICD-10-CM | POA: Diagnosis not present

## 2018-01-12 DIAGNOSIS — O24414 Gestational diabetes mellitus in pregnancy, insulin controlled: Secondary | ICD-10-CM | POA: Diagnosis not present

## 2018-01-12 DIAGNOSIS — O99283 Endocrine, nutritional and metabolic diseases complicating pregnancy, third trimester: Secondary | ICD-10-CM | POA: Diagnosis not present

## 2018-01-12 DIAGNOSIS — O26899 Other specified pregnancy related conditions, unspecified trimester: Secondary | ICD-10-CM

## 2018-01-12 DIAGNOSIS — Z7982 Long term (current) use of aspirin: Secondary | ICD-10-CM | POA: Diagnosis not present

## 2018-01-12 DIAGNOSIS — R05 Cough: Secondary | ICD-10-CM | POA: Diagnosis not present

## 2018-01-12 DIAGNOSIS — O34219 Maternal care for unspecified type scar from previous cesarean delivery: Secondary | ICD-10-CM

## 2018-01-12 DIAGNOSIS — O9989 Other specified diseases and conditions complicating pregnancy, childbirth and the puerperium: Secondary | ICD-10-CM | POA: Diagnosis not present

## 2018-01-12 DIAGNOSIS — O0990 Supervision of high risk pregnancy, unspecified, unspecified trimester: Secondary | ICD-10-CM

## 2018-01-12 LAB — INFLUENZA PANEL BY PCR (TYPE A & B)
INFLAPCR: NEGATIVE
INFLBPCR: NEGATIVE

## 2018-01-12 MED ORDER — GUAIFENESIN 100 MG/5ML PO SOLN
5.0000 mL | ORAL | Status: DC | PRN
  Administered 2018-01-12: 100 mg via ORAL
  Filled 2018-01-12 (×2): qty 5

## 2018-01-12 MED ORDER — SALINE SPRAY 0.65 % NA SOLN
1.0000 | NASAL | Status: DC | PRN
  Administered 2018-01-12: 1 via NASAL
  Filled 2018-01-12: qty 44

## 2018-01-12 NOTE — Telephone Encounter (Signed)
Please advise 

## 2018-01-12 NOTE — Telephone Encounter (Signed)
Patient is calling with Fever and difficulty breathing. Patient is schedule tomorrow for NST, AFI and ROB. Patient reports having fever of 103. Please advise

## 2018-01-12 NOTE — Discharge Summary (Signed)
Physician Final Progress Note  Patient ID: Denise Walker MRN: [id number] DOB/AGE: [birth year] 27 y.o.  Admit date: 01/12/2018 Admitting provider: Conard Novak, MD Discharge date: 01/12/2018   Admission Diagnoses: upper respiratory congestion, cough  Discharge Diagnoses:  Active Problems:   Indication for care in labor and delivery, antepartum IUP at 33 weeks, Reactive NST, upper respiratory congestion with non-productive cough  History of Present Illness: The patient is a 27 y.o. female [phone] at [redacted]w[redacted]d who presents for generally not feeling well with sinus congestion and tenderness, frontal headache, tightness in upper chest, non-productive cough, and fever yesterday. She denies nausea although she vomited a few times in response to coughing. She denies diarrhea or other intestinal disturbance. She denies body aches. She does admit her daughter has similar symptoms of cough and congestion. She admits taking tylenol and throat lozenges. She called the office to see what medications would be safe to take and it was recommended she come to labor and delivery for flu evaluation. She admits good fetal movement. She denies contractions, leakage of fluid or vaginal bleeding.   Past Medical History:  Diagnosis Date  . Gestational diabetes   . PCOS (polycystic ovarian syndrome)     Past Surgical History:  Procedure Laterality Date  . CESAREAN SECTION N/A 07/15/2014   Procedure: CESAREAN SECTION;  Surgeon: Denise Mustard, MD;  Location: [facility] ORS;  Service: Obstetrics;  Laterality: N/A;  . HERNIA REPAIR  10/2013  . WISDOM TOOTH EXTRACTION      No current facility-administered medications on file prior to encounter.    Current Outpatient Medications on File Prior to Encounter  Medication Sig Dispense Refill  . aspirin EC 81 MG tablet Take 81 mg by mouth daily.    . ferrous sulfate (FERROUSUL) 325 (65 FE) MG tablet Take 1 tablet (325 mg total) by mouth daily. 30 tablet 4  . insulin aspart  (NOVOLOG) 100 UNIT/ML FlexPen Inject 2 Units into the skin 3 (three) times daily with meals. 15 mL 11  . LEVEMIR 100 UNIT/ML injection INJECT 0.2 MLS (20 UNITS TOTAL) INTO THE SKIN AT BEDTIME. 30 mL 4  . Prenatal MV-Min-Fe Fum-FA-DHA (PRENATAL 1 PO) Take 1 tablet by mouth daily.    Marland Kitchen ACCU-CHEK FASTCLIX LANCETS MISC 1 Units by Percutaneous route 4 (four) times daily. 100 each 12  . escitalopram (LEXAPRO) 10 MG tablet Take 1 tablet (10 mg total) by mouth daily. 30 tablet 6  . fluticasone (FLONASE) 50 MCG/ACT nasal spray 2 sprays by Each Nare route Two (2) times a day.    Marland Kitchen glucose blood (ACCU-CHEK GUIDE) test strip Use as instructed (Patient not taking: Reported on 12/18/2017) 100 each 12  . Insulin Pen Needle (NOVOFINE) 32G X 6 MM MISC Use as directed 100 each 3  . Insulin Syringe-Needle U-100 29G 0.5 ML MISC Use as directed 100 each 3  . metFORMIN (GLUCOPHAGE) 500 MG tablet Take 1 tablet (500 mg total) by mouth 2 (two) times daily. 60 tablet 6  . Triamcinolone Acetonide 0.025 % LOTN Apply 1 application topically 2 (two) times daily. 60 mL 0    Allergies  Allergen Reactions  . Fish Allergy Shortness Of Breath    Social History   Socioeconomic History  . Marital status: Married    Spouse name: Not on file  . Number of children: Not on file  . Years of education: Not on file  . Highest education level: Not on file  Occupational History  . Not on file  Social  Needs  . Financial resource strain: Not hard at all  . Food insecurity:    Worry: Never true    Inability: Never true  . Transportation needs:    Medical: No    Non-medical: No  Tobacco Use  . Smoking status: Never Smoker  . Smokeless tobacco: Never Used  Substance and Sexual Activity  . Alcohol use: No  . Drug use: No  . Sexual activity: Yes    Birth control/protection: Other-see comments    Comment: will discuss with MD  Lifestyle  . Physical activity:    Days per week: 0 days    Minutes per session: 0 min  .  Stress: To some extent  Relationships  . Social connections:    Talks on phone: More than three times a week    Gets together: Three times a week    Attends religious service: Never    Active member of club or organization: No    Attends meetings of clubs or organizations: Never    Relationship status: Married  . Intimate partner violence:    Fear of current or ex partner: No    Emotionally abused: No    Physically abused: No    Forced sexual activity: No  Other Topics Concern  . Not on file  Social History Narrative  . Not on file    Family History  Problem Relation Age of Onset  . Cancer Mother   . Diabetes Paternal Grandfather   . Diabetes Paternal Aunt      Review of Systems  Constitutional: Positive for fever.  HENT: Positive for congestion and sinus pain.   Eyes: Negative.   Respiratory: Positive for cough.   Cardiovascular: Positive for chest pain.  Gastrointestinal: Positive for vomiting.  Genitourinary: Negative.   Musculoskeletal: Negative.   Skin: Negative.   Neurological: Negative.   Endo/Heme/Allergies: Negative.   Psychiatric/Behavioral: Negative.      Physical Exam: BP (!) 102/55 (BP Location: Left Arm)   Pulse (!) 105   Temp 98.2 F (36.8 C) (Oral)   Resp 18   Ht 4\' 11"  (1.499 m)   Wt 78.9 kg   LMP 05/25/2017 (Exact Date)   BMI 35.14 kg/m   Constitutional: Well nourished, well developed female in no acute distress.  HEENT: tenderness to palpation over sinus region Skin: Warm and dry.  Cardiovascular: Regular rate and rhythm.   Extremity: no edema Respiratory: Clear to auscultation bilateral. Normal respiratory effort, dry cough Abdomen: soft, nontender, nondistended, no abnormal masses, no epigastric pain and FHT present Psych: Alert and Oriented x3. No memory deficits. Normal mood and affect.   Toco: negative Fetal well being: 125 bpm, moderate variability, +accelerations, -decelerations  Consults: None  Significant Findings/  Diagnostic Studies: labs  Results for Denise Walker (MRN [id number]) as of 01/12/2018 14:02  Ref. Range 01/12/2018 11:54  Influenza A By PCR Latest Ref Range: NEGATIVE  NEGATIVE  Influenza B By PCR Latest Ref Range: NEGATIVE  NEGATIVE    Procedures: NST  Hospital Course: The patient was admitted to Labor and Delivery Triage for observation.   Discharge Condition: good  Disposition: Discharge disposition: 01-Home or Self Care       Diet: Diabetic healthy diet, increase hydration  Discharge Activity: Activity as tolerated, rest as needed  Discharge Instructions    Discharge activity:  No Restrictions   Complete by:  As directed    Discharge diet:  No restrictions   Complete by:  As directed    Fetal Kick  Count:  Lie on our left side for one hour after a meal, and count the number of times your baby kicks.  If it is less than 5 times, get up, move around and drink some juice.  Repeat the test 30 minutes later.  If it is still less than 5 kicks in an hour, notify your doctor.   Complete by:  As directed    No sexual activity restrictions   Complete by:  As directed    Notify physician for a general feeling that "something is not right"   Complete by:  As directed    Notify physician for increase or change in vaginal discharge   Complete by:  As directed    Notify physician for intestinal cramps, with or without diarrhea, sometimes described as "gas pain"   Complete by:  As directed    Notify physician for leaking of fluid   Complete by:  As directed    Notify physician for low, dull backache, unrelieved by heat or Tylenol   Complete by:  As directed    Notify physician for menstrual like cramps   Complete by:  As directed    Notify physician for pelvic pressure   Complete by:  As directed    Notify physician for uterine contractions.  These may be painless and feel like the uterus is tightening or the baby is  "balling up"   Complete by:  As directed    Notify physician  for vaginal bleeding   Complete by:  As directed    PRETERM LABOR:  Includes any of the follwing symptoms that occur between 20 - [redacted] weeks gestation.  If these symptoms are not stopped, preterm labor can result in preterm delivery, placing your baby at risk   Complete by:  As directed      Allergies as of 01/12/2018      Reactions   Fish Allergy Shortness Of Breath      Medication List    TAKE these medications   ACCU-CHEK FASTCLIX LANCETS Misc 1 Units by Percutaneous route 4 (four) times daily.   aspirin EC 81 MG tablet Take 81 mg by mouth daily.   escitalopram 10 MG tablet Commonly known as:  LEXAPRO Take 1 tablet (10 mg total) by mouth daily.   ferrous sulfate 325 (65 FE) MG tablet Take 1 tablet (325 mg total) by mouth daily.   fluticasone 50 MCG/ACT nasal spray Commonly known as:  FLONASE 2 sprays by Each Nare route Two (2) times a day.   glucose blood test strip Use as instructed   insulin aspart 100 UNIT/ML FlexPen Commonly known as:  NOVOLOG Inject 2 Units into the skin 3 (three) times daily with meals.   Insulin Pen Needle 32G X 6 MM Misc Use as directed   Insulin Syringe-Needle U-100 29G 0.5 ML Misc Use as directed   LEVEMIR 100 UNIT/ML injection Generic drug:  insulin detemir INJECT 0.2 MLS (20 UNITS TOTAL) INTO THE SKIN AT BEDTIME.   metFORMIN 500 MG tablet Commonly known as:  GLUCOPHAGE Take 1 tablet (500 mg total) by mouth 2 (two) times daily.   PRENATAL 1 PO Take 1 tablet by mouth daily.   Triamcinolone Acetonide 0.025 % Lotn Apply 1 application topically 2 (two) times daily.  You may take Robitussin for cough, mucinex for congestion, saline nasal spray for congestion, Vitamin C for immune function You should not take Sudafed due to your history of hypertension     Follow-up Information  [facility]. Go to.   Specialty:  Obstetrics and Gynecology Why:  regular scheduled prenatal appointment  Contact information: [address] [place] [address] [phone]          Total time spent taking care of this patient: 25 minutes  Signed: Tresea Mall, CNM  01/12/2018, 1:41 PM

## 2018-01-12 NOTE — Telephone Encounter (Signed)
Spoke with patient given constellation of symptoms recommended L&D eval for influenza evaluation any imaging as neccessary

## 2018-01-13 ENCOUNTER — Ambulatory Visit (INDEPENDENT_AMBULATORY_CARE_PROVIDER_SITE_OTHER): Payer: BLUE CROSS/BLUE SHIELD | Admitting: Obstetrics and Gynecology

## 2018-01-13 ENCOUNTER — Ambulatory Visit (INDEPENDENT_AMBULATORY_CARE_PROVIDER_SITE_OTHER): Payer: BLUE CROSS/BLUE SHIELD

## 2018-01-13 VITALS — BP 124/68 | Temp 98.4°F | Wt 180.0 lb

## 2018-01-13 DIAGNOSIS — Z3A33 33 weeks gestation of pregnancy: Secondary | ICD-10-CM

## 2018-01-13 DIAGNOSIS — Z3689 Encounter for other specified antenatal screening: Secondary | ICD-10-CM | POA: Diagnosis not present

## 2018-01-13 DIAGNOSIS — O26893 Other specified pregnancy related conditions, third trimester: Secondary | ICD-10-CM | POA: Diagnosis not present

## 2018-01-13 DIAGNOSIS — O34219 Maternal care for unspecified type scar from previous cesarean delivery: Secondary | ICD-10-CM

## 2018-01-13 DIAGNOSIS — O0993 Supervision of high risk pregnancy, unspecified, third trimester: Secondary | ICD-10-CM | POA: Diagnosis not present

## 2018-01-13 DIAGNOSIS — O24414 Gestational diabetes mellitus in pregnancy, insulin controlled: Secondary | ICD-10-CM

## 2018-01-13 DIAGNOSIS — O09293 Supervision of pregnancy with other poor reproductive or obstetric history, third trimester: Secondary | ICD-10-CM | POA: Diagnosis not present

## 2018-01-13 DIAGNOSIS — Z8759 Personal history of other complications of pregnancy, childbirth and the puerperium: Secondary | ICD-10-CM

## 2018-01-13 DIAGNOSIS — Z3A31 31 weeks gestation of pregnancy: Secondary | ICD-10-CM

## 2018-01-13 DIAGNOSIS — Z6791 Unspecified blood type, Rh negative: Secondary | ICD-10-CM

## 2018-01-13 DIAGNOSIS — O26899 Other specified pregnancy related conditions, unspecified trimester: Secondary | ICD-10-CM

## 2018-01-13 DIAGNOSIS — O0990 Supervision of high risk pregnancy, unspecified, unspecified trimester: Secondary | ICD-10-CM

## 2018-01-13 LAB — POCT URINALYSIS DIPSTICK OB: POC,PROTEIN,UA: NEGATIVE

## 2018-01-13 NOTE — Progress Notes (Signed)
ROB AFI/NST 

## 2018-01-13 NOTE — Progress Notes (Signed)
Routine Prenatal Care Visit  Subjective  Denise Walker is a 27 y.o. G5P1031 at [redacted]w[redacted]d being seen today for ongoing prenatal care.  She is currently monitored for the following issues for this high-risk pregnancy and has Supervision of high risk pregnancy, antepartum; History of eclampsia; Rh negative state in antepartum period; Pregnancy with history of cesarean section, antepartum; Gestational diabetes mellitus (GDM) requiring insulin; and Indication for care in labor and delivery, antepartum on their problem list.  ----------------------------------------------------------------------------------- Patient reports no complaints.   Contractions: Not present. Vag. Bleeding: None.  Movement: Present. Denies leaking of fluid.  ----------------------------------------------------------------------------------- The following portions of the patient's history were reviewed and updated as appropriate: allergies, current medications, past family history, past medical history, past social history, past surgical history and problem list. Problem list updated.   Objective  Blood pressure 124/68, temperature 98.4 F (36.9 C), weight 180 lb (81.6 kg), last menstrual period 05/25/2017. Pregravid weight 186 lb (84.4 kg) Total Weight Gain -6 lb (-2.722 kg) Urinalysis:      Fetal Status: Fetal Heart Rate (bpm): 130   Movement: Present     General:  Alert, oriented and cooperative. Patient is in no acute distress.  Skin: Skin is warm and dry. No rash noted.   Cardiovascular: Normal heart rate noted  Respiratory: Normal respiratory effort, no problems with respiration noted  Abdomen: Soft, gravid, appropriate for gestational age. Pain/Pressure: Absent     Pelvic:  Cervical exam deferred        Extremities: Normal range of motion.     ental Status: Normal mood and affect. Normal behavior. Normal judgment and thought content.   Baseline: 130 Variability: moderate Accelerations: present Decelerations:  absent Tocometry: N/A The patient was monitored for 30 minutes, fetal heart rate tracing was deemed reactive, category I tracing,  US Ob Limited  Result Date: 01/13/2018 Patient Name: Tysheka Fanguy DOB: 1990-06-27 MRN: [id number] ULTRASOUND REPORT Location: [facility] Date of Service: 01/13/2018 Indications:AFI Findings: Mason Jim intrauterine pregnancy is visualized with FHR at 141 BPM. Fetal presentation is Cephalic. Placenta: anterior. Grade: 1 AFI: 11.7 cm Impression: 1. [redacted]w[redacted]d Viable Singleton Intrauterine pregnancy dated by previously established criteria. 2. AFI is 11.7 cm. Recommendations: 1.Clinical correlation with the patient's History and Physical Exam. Darlina Guys, RDMS RVT There is a singleton gestation with normal amniotic fluid volume. The visualized fetal anatomy appears within normal limits within the resolution of ultrasound as described above.  It must be noted that a normal ultrasound is unable to rule out fetal aneuploidy.  Vena Austria, MD, [facility], [facility] 01/13/2018, 1:27 PM   US Ob Follow Up  Result Date: 01/01/2018 Patient Name: Aviya Jarvie DOB: [birth year] MRN: [id number] ULTRASOUND REPORT Location: [facility] Date of Service: 12/31/2017 Indications: Growth Findings: Mason Jim intrauterine pregnancy is visualized with FHR at 153 BPM. Biometrics give an (U/S) Gestational age of [redacted]w[redacted]d and an (U/S) EDD of 05/03/2018; this correlates with the clinically established Estimated Date of Delivery: 03/01/18. Fetal presentation is Cephalic. Placenta: anterior. Grade: 1 AFI: Subjectively normal. Growth percentile is 42.5. EFW: 1,701g (3lbs 12oz) Impression: 1. [redacted]w[redacted]d Viable Singleton Intrauterine pregnancy previously established criteria. 2. Growth is 42.5 %ile.  Recommendations: 1.Clinical correlation with the patient's History and Physical Exam. Darlina Guys, RDMS RVT There is a singleton gestation with normal amniotic fluid volume. The fetal  biometry correlates with established dating.  Limited fetal anatomy was performed.The visualized fetal anatomical survey appears within normal limits within the resolution of ultrasound as described  above.  It must be noted that a normal ultrasound is unable to rule out fetal aneuploidy.  Vena AustriaAndreas Deronte Solis, MD, FACOG [facility], [facility]    Date Fasting Breakfast Lunch Dinner  11/25 101 113 108 135  11/26 90 104 117 146  11/27 99 115 103 173  11/28 94 109 167 152  11/29 96 128 120 122  11/30 92 112 153 119  12/1 91 98 106 115  12/3 98 109 Didn't eat 114    Assessment   27 y.o. G5P1031 at 9914w2d by  03/01/2018, by Last Menstrual Period presenting for routine prenatal visit  Plan   Pregnacy#5 Problems (from 05/25/17 to present)    Problem Noted Resolved   Supervision of high risk pregnancy, antepartum 07/17/2017 by Vena AustriaStaebler, Datha Kissinger, MD No   Priority:  High     Overview Addendum 12/04/2017 11:00 AM by Vena AustriaStaebler, Roniesha Hollingshead, MD    Clinic [facility] Prenatal Labs  Dating LMP = 6624w5d US Blood type: O negative  Genetic Screen NIPS: normal XX SMA neg, CF neg, FragileX neg Antibody:negative  Anatomic US Normal Rubella: Equivocal Varicella: Immune  GTT (GDM G1) Early: HgbA1C 5.5, 158 3-hr: 99 / 181 / 131 / 73 RPR: NR  Rhogam 12/04/17 HBsAg: negative  TDaP vaccine                  Flu Shot: 10/23/17 HIV: negative  Baby Food                                GBS:   Contraception  Pap: 07/22/17 NIL  CBB     CS/VBAC C-section [ ]  schedule at 30 weeks   Support Person Cody           History of eclampsia 07/22/2017 by Vena AustriaStaebler, Inis Borneman, MD No   Priority:  Medium     Overview Addendum 09/03/2017  5:46 PM by Vena AustriaStaebler, Analyce Tavares, MD    [X]  Aspirin 81 mg daily after 12 weeks; discontinue after 36 weeks  Baseline and surveillance labs [facility] refresh links as needed)  Lab Results  Component Value Date   PLT 292 07/22/2017   CREATININE 0.73 07/22/2017   AST 16  07/22/2017   ALT 13 07/22/2017   PROTCRRATIO 69 07/16/2014           Gestational diabetes mellitus (GDM) requiring insulin 09/08/2017 by Vena AustriaStaebler, Jourdain Guay, MD No   Overview Addendum 09/24/2017  5:10 PM by Vena AustriaStaebler, Angelene Rome, MD    Current Diabetic Medications:  None (was on metformin PCOS)   [X]  Diabetes Education and Testing Supplies [X]  Nutrition Counsult [X]  Fetal ECHO after 22-24 weeks  [ ]  Delivery planning contingent on fetal growth, AFI, glycemic control, and other co-morbidities but at least by 39 weeks  Metformin 500mg  bid  Baseline and surveillance labs [facility] refresh links as needed)  Lab Results  Component Value Date   CREATININE 0.73 07/22/2017   AST 16 07/22/2017   ALT 13 07/22/2017   TSH 2.170 03/27/2017   PROTCRRATIO 1.16 (H) 07/16/2014   Lab Results  Component Value Date   HGBA1C 5.5 07/22/2017    Antenatal Testing Class of DM U/S NST/AFI DELIVERY  Diabetes   A1 - good control - O24.410    A2 - good control - O24.419      A2  - poor control or poor compliance - O24.419, E11.65   (Macrosomia or polyhydramnios) **  E11.65 is extra code for poor control**    A2/B - O24.919  and B-C O24.319  Poor control B-C or D-R-F-T - O24.319  or  Type I DM - O24.019  20-38  20-38  20-24-28-32-36   20-24-28-32-35-38//fetal echo  20-24-27-30-33-36-38//fetal echo  40  32//2 x wk  32//2 x wk   32//2 x wk  28//BPP wkly then 32//2 x wk  40  39  PRN   39  PRN          Rh negative state in antepartum period 08/23/2017 by Vena Austria, MD No   Overview Addendum 12/25/2017  2:39 PM by Vena Austria, MD    [X]  Rhogam 28 weeks - 12/04/17      Pregnancy with history of cesarean section, antepartum 08/23/2017 by Vena Austria, MD No   History of gestational diabetes in prior pregnancy, currently pregnant 08/23/2017 by Vena Austria, MD 09/08/2017 by Vena Austria, MD       Gestational age appropriate obstetric  precautions including but not limited to vaginal bleeding, contractions, leaking of fluid and fetal movement were reviewed in detail with the patient.   - Increase long acting to 24 units - Increase dinner short acting to 6 units - Dinner BG running consistently up - NST reactive, AFI normal  Return in about 3 days (around 01/16/2018) for 3 days NST, 7 days NST/AFI.  Vena Austria, MD, Evern Core [facility], [facility] 01/13/2018, 1:48 PM

## 2018-01-16 ENCOUNTER — Ambulatory Visit (INDEPENDENT_AMBULATORY_CARE_PROVIDER_SITE_OTHER): Payer: BLUE CROSS/BLUE SHIELD | Admitting: Obstetrics and Gynecology

## 2018-01-16 VITALS — BP 122/74 | Wt 180.0 lb

## 2018-01-16 DIAGNOSIS — O26899 Other specified pregnancy related conditions, unspecified trimester: Secondary | ICD-10-CM

## 2018-01-16 DIAGNOSIS — O24414 Gestational diabetes mellitus in pregnancy, insulin controlled: Secondary | ICD-10-CM | POA: Diagnosis not present

## 2018-01-16 DIAGNOSIS — O26893 Other specified pregnancy related conditions, third trimester: Secondary | ICD-10-CM | POA: Diagnosis not present

## 2018-01-16 DIAGNOSIS — Z6791 Unspecified blood type, Rh negative: Secondary | ICD-10-CM

## 2018-01-16 DIAGNOSIS — Z3A33 33 weeks gestation of pregnancy: Secondary | ICD-10-CM

## 2018-01-16 DIAGNOSIS — O34211 Maternal care for low transverse scar from previous cesarean delivery: Secondary | ICD-10-CM | POA: Diagnosis not present

## 2018-01-16 DIAGNOSIS — O34219 Maternal care for unspecified type scar from previous cesarean delivery: Secondary | ICD-10-CM

## 2018-01-16 DIAGNOSIS — Z8759 Personal history of other complications of pregnancy, childbirth and the puerperium: Secondary | ICD-10-CM

## 2018-01-16 DIAGNOSIS — O0990 Supervision of high risk pregnancy, unspecified, unspecified trimester: Secondary | ICD-10-CM

## 2018-01-16 DIAGNOSIS — O09293 Supervision of pregnancy with other poor reproductive or obstetric history, third trimester: Secondary | ICD-10-CM

## 2018-01-16 LAB — POCT URINALYSIS DIPSTICK OB: Glucose, UA: NEGATIVE

## 2018-01-16 NOTE — Progress Notes (Signed)
Routine Prenatal Care Visit  Subjective  Denise Walker is a 27 y.o. G5P1031 at 1417w0d being seen today for ongoing prenatal care.  She is currently monitored for the following issues for this high-risk pregnancy and has Supervision of high risk pregnancy, antepartum; History of eclampsia; Rh negative state in antepartum period; Pregnancy with history of cesarean section, antepartum; Gestational diabetes mellitus (GDM) requiring insulin; and Indication for care in labor and delivery, antepartum on their problem list.  ----------------------------------------------------------------------------------- Patient reports no complaints.   Contractions: Not present. Vag. Bleeding: None.  Movement: Present. Denies leaking of fluid.  ----------------------------------------------------------------------------------- The following portions of the patient's history were reviewed and updated as appropriate: allergies, current medications, past family history, past medical history, past social history, past surgical history and problem list. Problem list updated.   Objective  Blood pressure 122/74, weight 180 lb (81.6 kg), last menstrual period 05/25/2017. Pregravid weight 186 lb (84.4 kg) Total Weight Gain -6 lb (-2.722 kg) Urinalysis: Urine Protein Trace  Urine Glucose Negative  Fetal Status: Fetal Heart Rate (bpm): 135   Movement: Present     General:  Alert, oriented and cooperative. Patient is in no acute distress.  Skin: Skin is warm and dry. No rash noted.   Cardiovascular: Normal heart rate noted  Respiratory: Normal respiratory effort, no problems with respiration noted  Abdomen: Soft, gravid, appropriate for gestational age. Pain/Pressure: Absent     Pelvic:  Cervical exam deferred        Extremities: Normal range of motion.  Edema: None  Mental Status: Normal mood and affect. Normal behavior. Normal judgment and thought content.   NST: Baseline FHR: 135 beats/min Variability:  moderate Accelerations: present Decelerations: absent Tocometry: not done  Interpretation:  INDICATIONS: gestational diabetes mellitus RESULTS:  A NST procedure was performed with FHR monitoring and a normal baseline established, appropriate time of 20-40 minutes of evaluation, and accels >2 seen w 15x15 characteristics.  Results show a REACTIVE NST.    Blood Glucose Log:   Most fasting values elevated.  Postprandial values >50% normal.  Increase long-acting to 28 units at bedtime.  Use between 2-6 units of short-acting depending on what is eaten. Decrease dose, if gets any very low values.   Assessment   27 y.o. [id number] at 1217w0d by  03/01/2018, by Last Menstrual Period presenting for routine prenatal visit  Plan   Pregnacy#5 Problems (from 05/25/17 to present)    Problem Noted Resolved   Gestational diabetes mellitus (GDM) requiring insulin 09/08/2017 by Vena AustriaStaebler, Andreas, MD No   Overview Addendum 01/13/2018  1:53 PM by Vena AustriaStaebler, Andreas, MD    Current Diabetic Medications:  None (was on metformin PCOS)   [X]  Diabetes Education and Testing Supplies [X]  Nutrition Counsult [X]  Fetal ECHO after 22-24 weeks  [ ]  Delivery planning contingent on fetal growth, AFI, glycemic control, and other co-morbidities but at least by 39 weeks  Levemir 24U qHS (01/13/18) Aspart 4U meals, 6U dinner (01/13/18)  [facility] and surveillance [facility] refresh links as needed)  Lab Results  Component Value Date   CREATININE 0.73 07/22/2017   AST 16 07/22/2017   ALT 13 07/22/2017   TSH 2.170 03/27/2017   PROTCRRATIO 1.16 (H) 07/16/2014   Lab Results  Component Value Date   HGBA1C 5.5 07/22/2017    Antenatal Testing Class of DM U/S NST/AFI DELIVERY  Diabetes   A1 - good control - O24.410    A2 - good control - O24.419      A2  -  poor control or poor compliance - O24.419, E11.65   (Macrosomia or polyhydramnios) **E11.65 is extra code for poor control**    A2/B - O24.919  and  B-C O24.319  Poor control B-C or D-R-F-T - O24.319  or  Type I DM - O24.019  20-38  20-38  20-24-28-32-36   20-24-28-32-35-38//fetal echo  20-24-27-30-33-36-38//fetal echo  40  32//2 x wk  32//2 x wk   32//2 x wk  28//BPP wkly then 32//2 x wk  40  39  PRN   39  PRN          Rh negative state in antepartum period 08/23/2017 by Vena Austria, MD No   Overview Addendum 12/25/2017  2:39 PM by Vena Austria, MD    [X]  Rhogam 28 weeks - 12/04/17      Pregnancy with history of cesarean section, antepartum 08/23/2017 by Vena Austria, MD No   History of eclampsia 07/22/2017 by Vena Austria, MD No   Overview Addendum 09/03/2017  5:46 PM by Vena Austria, MD    [X]  Aspirin 81 mg daily after 12 weeks; discontinue after 36 weeks  Baseline and surveillance labs (pulled in from [facility], refresh links as needed)  Lab Results  Component Value Date   PLT 292 07/22/2017   CREATININE 0.73 07/22/2017   AST 16 07/22/2017   ALT 13 07/22/2017   PROTCRRATIO 69 07/16/2014       Supervision of high risk pregnancy, antepartum 07/17/2017 by Vena Austria, MD No   Overview Addendum 12/04/2017 11:00 AM by Vena Austria, MD    Clinic [facility] Prenatal Labs  Dating LMP = [redacted]w[redacted]d [place] Blood type: O negative  Genetic Screen NIPS: normal XX SMA neg, CF neg, FragileX neg Antibody:negative  Anatomic [place] Normal Rubella: Equivocal Varicella: Immune  GTT (GDM G1) Early: HgbA1C 5.5, 158 3-hr: 99 / 181 / 131 / 73 RPR: NR  Rhogam 12/04/17 HBsAg: negative  TDaP vaccine                  Flu Shot: 10/23/17 HIV: negative  Baby Food                                GBS:   Contraception  Pap: 07/22/17 NIL  CBB     CS/VBAC C-section [ ]  schedule at 30 weeks   Support Person Cody         History of gestational diabetes in prior pregnancy, currently pregnant 08/23/2017 by Vena Austria, MD 09/08/2017 by Vena Austria, MD      Preterm labor symptoms and general obstetric  precautions including but not limited to vaginal bleeding, contractions, leaking of fluid and fetal movement were reviewed in detail with the patient. Please refer to After Visit Summary for other counseling recommendations.   Return in about 1 week (around 01/23/2018) for ROB/NST (1wk) and 3 days later with U/S for AFI with ROB/NST.  Thomasene Mohair, MD, [facility], [facility] 01/18/2018 6:39 PM

## 2018-01-18 ENCOUNTER — Encounter: Payer: Self-pay | Admitting: Obstetrics and Gynecology

## 2018-01-20 ENCOUNTER — Ambulatory Visit (INDEPENDENT_AMBULATORY_CARE_PROVIDER_SITE_OTHER): Payer: BLUE CROSS/BLUE SHIELD | Admitting: Obstetrics and Gynecology

## 2018-01-20 ENCOUNTER — Ambulatory Visit (INDEPENDENT_AMBULATORY_CARE_PROVIDER_SITE_OTHER): Payer: BLUE CROSS/BLUE SHIELD

## 2018-01-20 VITALS — BP 120/64 | Wt 181.0 lb

## 2018-01-20 DIAGNOSIS — O34219 Maternal care for unspecified type scar from previous cesarean delivery: Secondary | ICD-10-CM

## 2018-01-20 DIAGNOSIS — O09293 Supervision of pregnancy with other poor reproductive or obstetric history, third trimester: Secondary | ICD-10-CM

## 2018-01-20 DIAGNOSIS — O26899 Other specified pregnancy related conditions, unspecified trimester: Secondary | ICD-10-CM

## 2018-01-20 DIAGNOSIS — O0990 Supervision of high risk pregnancy, unspecified, unspecified trimester: Secondary | ICD-10-CM

## 2018-01-20 DIAGNOSIS — Z8759 Personal history of other complications of pregnancy, childbirth and the puerperium: Secondary | ICD-10-CM

## 2018-01-20 DIAGNOSIS — Z6791 Unspecified blood type, Rh negative: Secondary | ICD-10-CM

## 2018-01-20 DIAGNOSIS — O24414 Gestational diabetes mellitus in pregnancy, insulin controlled: Secondary | ICD-10-CM | POA: Diagnosis not present

## 2018-01-20 DIAGNOSIS — O09893 Supervision of other high risk pregnancies, third trimester: Secondary | ICD-10-CM

## 2018-01-20 DIAGNOSIS — Z3A34 34 weeks gestation of pregnancy: Secondary | ICD-10-CM

## 2018-01-20 DIAGNOSIS — O26893 Other specified pregnancy related conditions, third trimester: Secondary | ICD-10-CM

## 2018-01-20 NOTE — Progress Notes (Signed)
Routine Prenatal Care Visit  Subjective  [name] is a 27 y.o. G5P1031 at [redacted]w[redacted]d being seen today for ongoing prenatal care.  She is currently monitored for the following issues for this high-risk pregnancy and has Supervision of high risk pregnancy, antepartum; History of eclampsia; Rh negative state in antepartum period; Pregnancy with history of cesarean section, antepartum; Gestational diabetes mellitus (GDM) requiring insulin; and Indication for care in labor and delivery, antepartum on their problem list.  ----------------------------------------------------------------------------------- Patient reports no complaints.   Contractions: Not present. Vag. Bleeding: None.  Movement: Present. Denies leaking of fluid.  BG levels not well controlled overall, but improved the last couple of days (see below) U/S AFI: 9 cm  BG levels:  12/6-12/8   BG vales 12/8-12/9   ----------------------------------------------------------------------------------- The following portions of the patient's history were reviewed and updated as appropriate: allergies, current medications, past family history, past medical history, past social history, past surgical history and problem list. Problem list updated.   Objective  Blood pressure 120/64, weight 181 lb (82.1 kg), last menstrual period 05/25/2017. Pregravid weight 186 lb (84.4 kg) Total Weight Gain -5 lb (-2.268 kg) Urinalysis: Urine Protein    Urine Glucose    Fetal Status: Fetal Heart Rate (bpm): 130   Movement: Present     General:  Alert, oriented and cooperative. Patient is in no acute distress.  Skin: Skin is warm and dry. No rash noted.   Cardiovascular: Normal heart rate noted  Respiratory: Normal respiratory effort, no problems with respiration noted  Abdomen: Soft, gravid, appropriate for gestational age. Pain/Pressure: Absent     Pelvic:  Cervical exam deferred        Extremities: Normal range of motion.  Edema: None  Mental  Status: Normal mood and affect. Normal behavior. Normal judgment and thought content.   NST: Baseline FHR: 130 beats/min Variability: moderate Accelerations: present Decelerations: absent Tocometry: not done  Interpretation:  INDICATIONS: gestational diabetes mellitus RESULTS:  A NST procedure was performed with FHR monitoring and a normal baseline established, appropriate time of 20-40 minutes of evaluation, and accels >2 seen w 15x15 characteristics.  Results show a REACTIVE NST.    Imaging Results: US Ob Limited  Result Date: 01/20/2018 Patient Name: Denise Walker DOB: [birth year] MRN: [id number] ULTRASOUND REPORT Location: [facility] Date of Service: 01/20/2018 Indications:AFI Findings: Mason Jim intrauterine pregnancy is visualized with FHR at 127 BPM. Fetal presentation is Cephalic. Placenta: anterior. Grade: 1 AFI: 9.0 cm Impression: 1. [redacted]w[redacted]d Viable Singleton Intrauterine pregnancy dated by previously established criteria. 2. AFI is 9.0 cm. Darlina Guys, RDMS RVT The ultrasound images and findings were reviewed by me and I agree with the above report. Thomasene Mohair, MD, [facility], [facility] 01/20/2018 3:16 PM      Assessment   27 y.o. [id number] at [redacted]w[redacted]d by  03/01/2018, by Last Menstrual Period presenting for routine prenatal visit  Plan   Pregnacy#5 Problems (from 05/25/17 to present)    Problem Noted Resolved   Gestational diabetes mellitus (GDM) requiring insulin 09/08/2017 by Vena Austria, MD No   Overview Addendum 01/13/2018  1:53 PM by Vena Austria, MD    Current Diabetic Medications:  None (was on metformin PCOS)   [X]  Diabetes Education and Testing Supplies [X]  Nutrition Counsult [X]  Fetal ECHO after 22-24 weeks  [ ]  Delivery planning contingent on fetal growth, AFI, glycemic control, and other co-morbidities but at least by 39 weeks  Levemir 24U qHS (01/13/18) Aspart 4U meals, 6U dinner (01/13/18)  Baseline and surveillance  labs (pulled in from [facility], refresh links as needed)  Lab Results  Component Value Date   CREATININE 0.73 07/22/2017   AST 16 07/22/2017   ALT 13 07/22/2017   TSH 2.170 03/27/2017   PROTCRRATIO 1.16 (H) 07/16/2014   Lab Results  Component Value Date   HGBA1C 5.5 07/22/2017    Antenatal Testing Class of DM U/S NST/AFI DELIVERY  Diabetes   A1 - good control - O24.410    A2 - good control - O24.419      A2  - poor control or poor compliance - O24.419, E11.65   (Macrosomia or polyhydramnios) **E11.65 is extra code for poor control**    A2/B - O24.919  and B-C O24.319  Poor control B-C or D-R-F-T - O24.319  or  Type I DM - O24.019  20-38  20-38  20-24-28-32-36   20-24-28-32-35-38//fetal echo  20-24-27-30-33-36-38//fetal echo  40  32//2 x wk  32//2 x wk   32//2 x wk  28//BPP wkly then 32//2 x wk  40  39  PRN   39  PRN          Rh negative state in antepartum period 08/23/2017 by Vena Austria, MD No   Overview Addendum 12/25/2017  2:39 PM by Vena Austria, MD    [X]  Rhogam 28 weeks - 12/04/17      Pregnancy with history of cesarean section, antepartum 08/23/2017 by Vena Austria, MD No   History of eclampsia 07/22/2017 by Vena Austria, MD No   Overview Addendum 09/03/2017  5:46 PM by Vena Austria, MD    [X]  Aspirin 81 mg daily after 12 weeks; discontinue after 36 weeks  Baseline and surveillance labs (pulled in from [facility], refresh links as needed)  Lab Results  Component Value Date   PLT 292 07/22/2017   CREATININE 0.73 07/22/2017   AST 16 07/22/2017   ALT 13 07/22/2017   PROTCRRATIO 69 07/16/2014           Supervision of high risk pregnancy, antepartum 07/17/2017 by Vena Austria, MD No   Overview Addendum 12/04/2017 11:00 AM by Vena Austria, MD    Clinic [facility] Prenatal Labs  Dating LMP = [redacted]w[redacted]d [place] Blood type: O negative  Genetic Screen NIPS: normal XX SMA neg, CF neg, FragileX neg Antibody:negative   Anatomic [place] Normal Rubella: Equivocal Varicella: Immune  GTT (GDM G1) Early: HgbA1C 5.5, 158 3-hr: 99 / 181 / 131 / 73 RPR: NR  Rhogam 12/04/17 HBsAg: negative  TDaP vaccine                  Flu Shot: 10/23/17 HIV: negative  Baby Food                                GBS:   Contraception  Pap: 07/22/17 NIL  CBB     CS/VBAC C-section [ ]  schedule at 30 weeks   Support Person Cody           History of gestational diabetes in prior pregnancy, currently pregnant 08/23/2017 by Vena Austria, MD 09/08/2017 by Vena Austria, MD       Preterm labor symptoms and general obstetric precautions including but not limited to vaginal bleeding, contractions, leaking of fluid and fetal movement were reviewed in detail with the patient. Please refer to After Visit Summary for other counseling recommendations.   - increase night-time dose of insulin to 32 units.  Continue short  acting as before. No episodes of low values since last visit  Return in about 3 days (around 01/23/2018) for ROB/NST, 1 week u/s for growth and ROB/NST after.  Thomasene MohairStephen Nyles Mitton, MD, Merlinda FrederickFACOG [facility], [facility] 01/20/2018 3:32 PM

## 2018-01-22 NOTE — Progress Notes (Signed)
Routine Prenatal Care Visit  Subjective  Denise Walker is a 27 y.o. G5P1031 at [redacted]w[redacted]d being seen today for ongoing prenatal care.  She is currently monitored for the following issues for this high-risk pregnancy and has Supervision of high risk pregnancy, antepartum; History of eclampsia; Rh negative state in antepartum period; Pregnancy with history of cesarean section, antepartum; Gestational diabetes mellitus (GDM) requiring insulin; and Indication for care in labor and delivery, antepartum on their problem list.  ----------------------------------------------------------------------------------- Patient reports no complaints.   Contractions: Not present. Vag. Bleeding: None.  Movement: Present. Denies leaking of fluid.  ----------------------------------------------------------------------------------- The following portions of the patient's history were reviewed and updated as appropriate: allergies, current medications, past family history, past medical history, past social history, past surgical history and problem list. Problem list updated.   Objective  Last menstrual period 05/25/2017. Pregravid weight 186 lb (84.4 kg) Total Weight Gain 3 lb (1.361 kg) Urinalysis:      Fetal Status: Fetal Heart Rate (bpm): 130 Fundal Height: 35 cm Movement: Present     General:  Alert, oriented and cooperative. Patient is in no acute distress.  Skin: Skin is warm and dry. No rash noted.   Cardiovascular: Normal heart rate noted  Respiratory: Normal respiratory effort, no problems with respiration noted  Abdomen: Soft, gravid, appropriate for gestational age. Pain/Pressure: Absent     Pelvic:  Cervical exam deferred        Extremities: Normal range of motion.     ental Status: Normal mood and affect. Normal behavior. Normal judgment and thought content.   Baseline: 130 Variability: moderate Accelerations: present Decelerations: absent Tocometry: N/A The patient was monitored for 30  minutes, fetal heart rate tracing was deemed reactive, category I tracing,  Date Fasting Breakfast Lunch Dinner  12/2 98 109  114  12/3 96 112 122 143  12/4 82 126 118 91  12/5 98 115 134 93  12/6 101  123 213  12/7 87 115 118 113  12/8 95 103 109 143  12/9 95 130 119 106  12/10 95 114 96 160  12/11 94 117 84 147  12/12 89 114 113 133    US Ob Limited  Result Date: 01/20/2018 Patient Name: Takima Encina DOB: 01/26/1991 MRN: [id number] ULTRASOUND REPORT Location: [facility] Date of Service: 01/20/2018 Indications:AFI Findings: Mason Jim intrauterine pregnancy is visualized with FHR at 127 BPM. Fetal presentation is Cephalic. Placenta: anterior. Grade: 1 AFI: 9.0 cm Impression: 1. [redacted]w[redacted]d Viable Singleton Intrauterine pregnancy dated by previously established criteria. 2. AFI is 9.0 cm. Darlina Guys, RDMS RVT The ultrasound images and findings were reviewed by me and I agree with the above report. Thomasene Mohair, MD, [facility], [facility] 01/20/2018 3:16 PM     US Ob Limited  Result Date: 01/13/2018 Patient Name: Jonna Dittrich DOB: April 23, 1990 MRN: [id number] ULTRASOUND REPORT Location: [facility] Date of Service: 01/13/2018 Indications:AFI Findings: Mason Jim intrauterine pregnancy is visualized with FHR at 141 BPM. Fetal presentation is Cephalic. Placenta: anterior. Grade: 1 AFI: 11.7 cm Impression: 1. [redacted]w[redacted]d Viable Singleton Intrauterine pregnancy dated by previously established criteria. 2. AFI is 11.7 cm. Recommendations: 1.Clinical correlation with the patient's History and Physical Exam. Darlina Guys, RDMS RVT There is a singleton gestation with normal amniotic fluid volume. The visualized fetal anatomy appears within normal limits within the resolution of ultrasound as described above.  It must be noted that a normal ultrasound is unable to rule out fetal aneuploidy.  Vena Austria,  MD, Merlinda FrederickFACOG [facility], [facility] 01/13/2018, 1:27 PM     [place] Ob Follow Up  Result Date: 01/01/2018 Patient Name: Delmar LandauKelley Weir DOB: 1990/11/09 MRN: [id number] ULTRASOUND REPORT Location: [facility] Date of Service: 12/31/2017 Indications: Growth Findings: Mason JimSingleton intrauterine pregnancy is visualized with FHR at 153 BPM. Biometrics give an (U/S) Gestational age of 146w0d and an (U/S) EDD of 05/03/2018; this correlates with the clinically established Estimated Date of Delivery: 03/01/18. Fetal presentation is Cephalic. Placenta: anterior. Grade: 1 AFI: Subjectively normal. Growth percentile is 42.5. EFW: 1,701g (3lbs 12oz) Impression: 1. 5655w3d Viable Singleton Intrauterine pregnancy previously established criteria. 2. Growth is 42.5 %ile.  Recommendations: 1.Clinical correlation with the patient's History and Physical Exam. Darlina GuysAbby M Clarke, RDMS RVT There is a singleton gestation with normal amniotic fluid volume. The fetal biometry correlates with established dating.  Limited fetal anatomy was performed.The visualized fetal anatomical survey appears within normal limits within the resolution of ultrasound as described above.  It must be noted that a normal ultrasound is unable to rule out fetal aneuploidy.  Vena AustriaAndreas Yohan Samons, MD, Evern CoreFACOG [facility], [facility]    Assessment   27 y.o. [id number] at 5567w4d by  03/01/2018, by Last Menstrual Period presenting for routine prenatal visit  Plan   Pregnacy#5 Problems (from 05/25/17 to present)    Problem Noted Resolved   Supervision of high risk pregnancy, antepartum 07/17/2017 by Vena AustriaStaebler, Khyren Hing, MD No   Priority:  High     Overview Addendum 12/04/2017 11:00 AM by Vena AustriaStaebler, Manpreet Kemmer, MD    [facility]inic [facility] Prenatal Labs  Dating LMP = 1975w5d US Blood type: O negative  Genetic Screen NIPS: normal XX SMA neg, CF neg, FragileX neg Antibody:negative  Anatomic US Normal Rubella: Equivocal Varicella: Immune  GTT (GDM G1) Early: HgbA1C 5.5, 158 3-hr: 99 / 181 / 131 / 73 RPR: NR  Rhogam 12/04/17 HBsAg:  negative  TDaP vaccine                  Flu Shot: 10/23/17 HIV: negative  Baby Food                                GBS:   Contraception  Pap: 07/22/17 NIL  CBB     CS/VBAC C-section [ ]  schedule at 30 weeks   Support Person Cody           History of eclampsia 07/22/2017 by Vena AustriaStaebler, Annissa Andreoni, MD No   Priority:  Medium     Overview Addendum 09/03/2017  5:46 PM by Vena AustriaStaebler, Seleen Walter, MD    [X]  Aspirin 81 mg daily after 12 weeks; discontinue after 36 weeks  Baseline and surveillance labs ([facility], refresh links as needed)  Lab Results  Component Value Date   PLT 292 07/22/2017   CREATININE 0.73 07/22/2017   AST 16 07/22/2017   ALT 13 07/22/2017   PROTCRRATIO 69 07/16/2014           Gestational diabetes mellitus (GDM) requiring insulin 09/08/2017 by Vena AustriaStaebler, Kacee Sukhu, MD No   Overview Addendum 01/13/2018  1:53 PM by Vena AustriaStaebler, Jaydee Conran, MD    Current Diabetic Medications:  None (was on metformin PCOS)   [X]  Diabetes Education and Testing Supplies [X]  Nutrition Counsult [X]  Fetal ECHO after 22-24 weeks  [ ]  Delivery planning contingent on fetal growth, AFI, glycemic control, and other co-morbidities but at least by 39 weeks  Levemir 24U qHS (01/13/18)  Aspart 4U meals, 6U dinner (01/13/18)  Baseline and surveillance labs (pulled in from [facility], refresh links as needed)  Lab Results  Component Value Date   CREATININE 0.73 07/22/2017   AST 16 07/22/2017   ALT 13 07/22/2017   TSH 2.170 03/27/2017   PROTCRRATIO 1.16 (H) 07/16/2014   Lab Results  Component Value Date   HGBA1C 5.5 07/22/2017    Antenatal Testing Class of DM U/S NST/AFI DELIVERY  Diabetes   A1 - good control - O24.410    A2 - good control - O24.419      A2  - poor control or poor compliance - O24.419, E11.65   (Macrosomia or polyhydramnios) **E11.65 is extra code for poor control**    A2/B - O24.919  and B-C O24.319  Poor control B-C or D-R-F-T - O24.319  or  Type I DM - O24.019   20-38  20-38  20-24-28-32-36   20-24-28-32-35-38//fetal echo  20-24-27-30-33-36-38//fetal echo  40  32//2 x wk  32//2 x wk   32//2 x wk  28//BPP wkly then 32//2 x wk  40  39  PRN   39  PRN          Rh negative state in antepartum period 08/23/2017 by Vena Austria, MD No   Overview Addendum 12/25/2017  2:39 PM by Vena Austria, MD    [X]  Rhogam 28 weeks - 12/04/17      Pregnancy with history of cesarean section, antepartum 08/23/2017 by Vena Austria, MD No   History of gestational diabetes in prior pregnancy, currently pregnant 08/23/2017 by Vena Austria, MD 09/08/2017 by Vena Austria, MD       Gestational age appropriate obstetric precautions including but not limited to vaginal bleeding, contractions, leaking of fluid and fetal movement were reviewed in detail with the patient.   - dinner short acting insulin increase to 8 units, breakfast 4 units, lunch 4-6 depending on meal - long acting now at 32 units  Return in about 1 week (around 01/30/2018) for NST/AFI/ROB.  Vena Austria, MD, Evern Core [facility], [facility] 01/23/2018, 9:06 AM

## 2018-01-23 ENCOUNTER — Ambulatory Visit (INDEPENDENT_AMBULATORY_CARE_PROVIDER_SITE_OTHER): Payer: BLUE CROSS/BLUE SHIELD | Admitting: Obstetrics and Gynecology

## 2018-01-23 VITALS — BP 118/72 | Wt 189.0 lb

## 2018-01-23 DIAGNOSIS — Z3A34 34 weeks gestation of pregnancy: Secondary | ICD-10-CM

## 2018-01-23 DIAGNOSIS — O34219 Maternal care for unspecified type scar from previous cesarean delivery: Secondary | ICD-10-CM | POA: Diagnosis not present

## 2018-01-23 DIAGNOSIS — O24414 Gestational diabetes mellitus in pregnancy, insulin controlled: Secondary | ICD-10-CM

## 2018-01-23 DIAGNOSIS — O26893 Other specified pregnancy related conditions, third trimester: Secondary | ICD-10-CM | POA: Diagnosis not present

## 2018-01-23 DIAGNOSIS — O09293 Supervision of pregnancy with other poor reproductive or obstetric history, third trimester: Secondary | ICD-10-CM

## 2018-01-23 DIAGNOSIS — O26899 Other specified pregnancy related conditions, unspecified trimester: Secondary | ICD-10-CM

## 2018-01-23 DIAGNOSIS — Z8759 Personal history of other complications of pregnancy, childbirth and the puerperium: Secondary | ICD-10-CM

## 2018-01-23 DIAGNOSIS — O0990 Supervision of high risk pregnancy, unspecified, unspecified trimester: Secondary | ICD-10-CM

## 2018-01-23 DIAGNOSIS — Z6791 Unspecified blood type, Rh negative: Secondary | ICD-10-CM

## 2018-01-23 LAB — POCT URINALYSIS DIPSTICK OB
Glucose, UA: NEGATIVE
POC,PROTEIN,UA: NEGATIVE

## 2018-01-23 NOTE — Progress Notes (Signed)
ROB NST 

## 2018-01-29 ENCOUNTER — Ambulatory Visit (INDEPENDENT_AMBULATORY_CARE_PROVIDER_SITE_OTHER): Payer: BLUE CROSS/BLUE SHIELD

## 2018-01-29 ENCOUNTER — Ambulatory Visit (INDEPENDENT_AMBULATORY_CARE_PROVIDER_SITE_OTHER): Payer: BLUE CROSS/BLUE SHIELD | Admitting: Obstetrics and Gynecology

## 2018-01-29 VITALS — BP 116/65 | Wt 184.0 lb

## 2018-01-29 DIAGNOSIS — Z3A35 35 weeks gestation of pregnancy: Secondary | ICD-10-CM

## 2018-01-29 DIAGNOSIS — O24414 Gestational diabetes mellitus in pregnancy, insulin controlled: Secondary | ICD-10-CM | POA: Diagnosis not present

## 2018-01-29 DIAGNOSIS — O34219 Maternal care for unspecified type scar from previous cesarean delivery: Secondary | ICD-10-CM

## 2018-01-29 DIAGNOSIS — Z8759 Personal history of other complications of pregnancy, childbirth and the puerperium: Secondary | ICD-10-CM

## 2018-01-29 DIAGNOSIS — O26899 Other specified pregnancy related conditions, unspecified trimester: Secondary | ICD-10-CM

## 2018-01-29 DIAGNOSIS — O0990 Supervision of high risk pregnancy, unspecified, unspecified trimester: Secondary | ICD-10-CM

## 2018-01-29 DIAGNOSIS — O26893 Other specified pregnancy related conditions, third trimester: Secondary | ICD-10-CM

## 2018-01-29 DIAGNOSIS — Z6791 Unspecified blood type, Rh negative: Secondary | ICD-10-CM

## 2018-01-29 DIAGNOSIS — O09293 Supervision of pregnancy with other poor reproductive or obstetric history, third trimester: Secondary | ICD-10-CM

## 2018-01-29 LAB — POCT URINALYSIS DIPSTICK OB
Glucose, UA: NEGATIVE
POC,PROTEIN,UA: NEGATIVE

## 2018-01-29 NOTE — Progress Notes (Signed)
ROB NST/growth scan

## 2018-01-29 NOTE — Progress Notes (Signed)
Routine Prenatal Care Visit  Subjective  Denise Walker is a 27 y.o. G5P1031 at [redacted]w[redacted]d being seen today for ongoing prenatal care.  She is currently monitored for the following issues for this high-risk pregnancy and has Supervision of high risk pregnancy, antepartum; History of eclampsia; Rh negative state in antepartum period; Pregnancy with history of cesarean section, antepartum; Gestational diabetes mellitus (GDM) requiring insulin; and Indication for care in labor and delivery, antepartum on their problem list.  ----------------------------------------------------------------------------------- Patient reports no complaints.   Contractions: Not present. Vag. Bleeding: None.  Movement: Present. Denies leaking of fluid.  ----------------------------------------------------------------------------------- The following portions of the patient's history were reviewed and updated as appropriate: allergies, current medications, past family history, past medical history, past social history, past surgical history and problem list. Problem list updated.   Objective  Blood pressure 116/65, weight 184 lb (83.5 kg), last menstrual period 05/25/2017. Pregravid weight 186 lb (84.4 kg) Total Weight Gain -2 lb (-0.907 kg) Urinalysis:      Fetal Status: Fetal Heart Rate (bpm): 140   Movement: Present     General:  Alert, oriented and cooperative. Patient is in no acute distress.  Skin: Skin is warm and dry. No rash noted.   Cardiovascular: Normal heart rate noted  Respiratory: Normal respiratory effort, no problems with respiration noted  Abdomen: Soft, gravid, appropriate for gestational age. Pain/Pressure: Absent     Pelvic:  Cervical exam deferred        Extremities: Normal range of motion.     ental Status: Normal mood and affect. Normal behavior. Normal judgment and thought content.   US Ob Limited  Result Date: 01/20/2018 Patient Name: Denise Walker DOB: 02-01-1991 MRN: [id number]  ULTRASOUND REPORT Location: [facility] Date of Service: 01/20/2018 Indications:AFI Findings: Mason Jim intrauterine pregnancy is visualized with FHR at 127 BPM. Fetal presentation is Cephalic. Placenta: anterior. Grade: 1 AFI: 9.0 cm Impression: 1. [redacted]w[redacted]d Viable Singleton Intrauterine pregnancy dated by previously established criteria. 2. AFI is 9.0 cm. Darlina Guys, RDMS RVT The ultrasound images and findings were reviewed by me and I agree with the above report. Thomasene Mohair, MD, [facility], [facility] 01/20/2018 3:16 PM     US Ob Limited  Result Date: 01/13/2018 Patient Name: Denise Walker DOB: [birth year] MRN: [id number] ULTRASOUND REPORT Location: [facility] Date of Service: 01/13/2018 Indications:AFI Findings: Mason Jim intrauterine pregnancy is visualized with FHR at 141 BPM. Fetal presentation is Cephalic. Placenta: anterior. Grade: 1 AFI: 11.7 cm Impression: 1. [redacted]w[redacted]d Viable Singleton Intrauterine pregnancy dated by previously established criteria. 2. AFI is 11.7 cm. Recommendations: 1.Clinical correlation with the patient's History and Physical Exam. Darlina Guys, RDMS RVT There is a singleton gestation with normal amniotic fluid volume. The visualized fetal anatomy appears within normal limits within the resolution of ultrasound as described above.  It must be noted that a normal ultrasound is unable to rule out fetal aneuploidy.  Vena Austria, MD, [facility], [facility] 01/13/2018, 1:27 PM   US Ob Follow Up  Result Date: 01/29/2018 Patient Name: Denise Walker DOB: 02/19/1990 MRN: [id number] ULTRASOUND REPORT Location: [facility] Date of Service: 01/29/2018 Indications:growth/afi Findings: Mason Jim intrauterine pregnancy is visualized with FHR at 126 BPM. Biometrics give an (U/S) Gestational age of [redacted]w[redacted]d and an (U/S) EDD of 03/05/2018; this correlates with the clinically established Estimated Date of Delivery: 03/01/18. Fetal  presentation is Cephalic. Placenta: anterior. Grade: 1. Placenta appears somewhat thick. Measuring 5.7cm vertically. AFI: 12.5 cm Growth percentile  is 42.9. EFW: 2,665g (5lbs 14oz) Impression: 1. 484w4d Viable Singleton Intrauterine pregnancy previously established criteria. 2. Growth is 42.9 %ile.  AFI is 12.5 cm. 3. Thick appearing placenta. Recommendations: 1.Clinical correlation with the patient's History and Physical Exam. Darlina GuysAbby M Clarke, RDMS RVT There is a singleton gestation with normal amniotic fluid volume. The fetal biometry correlates with established dating.  Limited fetal anatomy was performed.The visualized fetal anatomical survey appears within normal limits within the resolution of ultrasound as described above.  It must be noted that a normal ultrasound is unable to rule out fetal aneuploidy.  Vena AustriaAndreas Gerene Nedd, MD, [facility]linda FrederickFACOG [facility], [facility] 01/29/2018, 2:03 PM   [place] Ob Follow Up  Result Date: 01/01/2018 Patient Name: Denise LandauKelley Walker DOB: 1991-01-09 MRN: [id number] ULTRASOUND REPORT Location: [facility] Date of Service: 12/31/2017 Indications: Growth Findings: Mason JimSingleton intrauterine pregnancy is visualized with FHR at 153 BPM. Biometrics give an (U/S) Gestational age of 4774w0d and an (U/S) EDD of 05/03/2018; this correlates with the clinically established Estimated Date of Delivery: 03/01/18. Fetal presentation is Cephalic. Placenta: anterior. Grade: 1 AFI: Subjectively normal. Growth percentile is 42.5. EFW: 1,701g (3lbs 12oz) Impression: 1. 7986w3d Viable Singleton Intrauterine pregnancy previously established criteria. 2. Growth is 42.5 %ile.  Recommendations: 1.Clinical correlation with the patient's History and Physical Exam. Darlina GuysAbby M Clarke, RDMS RVT There is a singleton gestation with normal amniotic fluid volume. The fetal biometry correlates with established dating.  Limited fetal anatomy was performed.The visualized fetal anatomical survey appears within normal limits  within the resolution of ultrasound as described above.  It must be noted that a normal ultrasound is unable to rule out fetal aneuploidy.  Vena AustriaAndreas Zaliyah Meikle, MD, Evern CoreFACOG [facility], [facility]     Assessment   27 y.o. [phone] at 334w4d by  03/01/2018, by Last Menstrual Period presenting for routine prenatal visit  Plan   Pregnacy#5 Problems (from 05/25/17 to present)    Problem Noted Resolved   Supervision of high risk pregnancy, antepartum 07/17/2017 by Vena AustriaStaebler, Kairyn Olmeda, MD No   Priority:  High     Overview Addendum 12/04/2017 11:00 AM by Vena AustriaStaebler, Naftula Donahue, MD    Clinic [facility] Prenatal Labs  Dating LMP = 732w5d US Blood type: O negative  Genetic Screen NIPS: normal XX SMA neg, CF neg, FragileX neg Antibody:negative  Anatomic US Normal Rubella: Equivocal Varicella: Immune  GTT (GDM G1) Early: HgbA1C 5.5, 158 3-hr: 99 / 181 / 131 / 73 RPR: NR  Rhogam 12/04/17 HBsAg: negative  TDaP vaccine                  Flu Shot: 10/23/17 HIV: negative  Baby Food                                GBS:   Contraception  Pap: 07/22/17 NIL  CBB     CS/VBAC C-section [ ]  schedule at 30 weeks   Support Person Cody           History of eclampsia 07/22/2017 by Vena AustriaStaebler, Milfred Krammes, MD No   Priority:  Medium     Overview Addendum 09/03/2017  5:46 PM by Vena AustriaStaebler, Ashtyn Freilich, MD    [X]  Aspirin 81 mg daily after 12 weeks; discontinue after 36 weeks  Baseline and surveillance labs [facility], refresh links as needed)  Lab Results  Component Value Date   PLT 292 07/22/2017   CREATININE 0.73 07/22/2017  AST 16 07/22/2017   ALT 13 07/22/2017   PROTCRRATIO 69 07/16/2014           Gestational diabetes mellitus (GDM) requiring insulin 09/08/2017 by Vena AustriaStaebler, Nea Gittens, MD No   Overview Addendum 01/13/2018  1:53 PM by Vena AustriaStaebler, Skyah Hannon, MD    Current Diabetic Medications:  None (was on metformin PCOS)   [X]  Diabetes Education and Testing Supplies [X]  Nutrition Counsult [X]  Fetal ECHO  after 22-24 weeks  [ ]  Delivery planning contingent on fetal growth, AFI, glycemic control, and other co-morbidities but at least by 39 weeks  Levemir 24U qHS (01/13/18) Aspart 4U meals, 6U dinner (01/13/18)  Baseline and surveillance labs ([facility], refresh links as needed)  Lab Results  Component Value Date   CREATININE 0.73 07/22/2017   AST 16 07/22/2017   ALT 13 07/22/2017   TSH 2.170 03/27/2017   PROTCRRATIO 1.16 (H) 07/16/2014   Lab Results  Component Value Date   HGBA1C 5.5 07/22/2017    Antenatal Testing Class of DM U/S NST/AFI DELIVERY  Diabetes   A1 - good control - O24.410    A2 - good control - O24.419      A2  - poor control or poor compliance - O24.419, E11.65   (Macrosomia or polyhydramnios) **E11.65 is extra code for poor control**    A2/B - O24.919  and B-C O24.319  Poor control B-C or D-R-F-T - O24.319  or  Type I DM - O24.019  20-38  20-38  20-24-28-32-36   20-24-28-32-35-38//fetal echo  20-24-27-30-33-36-38//fetal echo  40  32//2 x wk  32//2 x wk   32//2 x wk  28//BPP wkly then 32//2 x wk  40  39  PRN   39  PRN          Rh negative state in antepartum period 08/23/2017 by Vena AustriaStaebler, Kienan Doublin, MD No   Overview Addendum 12/25/2017  2:39 PM by Vena AustriaStaebler, Gurinder Toral, MD    [X]  Rhogam 28 weeks - 12/04/17      Pregnancy with history of cesarean section, antepartum 08/23/2017 by Vena AustriaStaebler, Tylek Boney, MD No   History of gestational diabetes in prior pregnancy, currently pregnant 08/23/2017 by Vena AustriaStaebler, Leaner Morici, MD 09/08/2017 by Vena AustriaStaebler, Mozel Burdett, MD       Gestational age appropriate obstetric precautions including but not limited to vaginal bleeding, contractions, leaking of fluid and fetal movement were reviewed in detail with the patient.   - appropriate growth and AFI - reactive NST - good glycemic control, occasional elevated value but no trends  Return in about 4 days (around 02/02/2018) for 12/23 ROB and NST, 12/26  NST/AFI/ROB.  Vena AustriaAndreas Orlene Salmons, MD, Evern CoreFACOG [facility], [facility] 01/29/2018, 2:20 PM

## 2018-02-02 ENCOUNTER — Ambulatory Visit (INDEPENDENT_AMBULATORY_CARE_PROVIDER_SITE_OTHER): Payer: BLUE CROSS/BLUE SHIELD | Admitting: Obstetrics & Gynecology

## 2018-02-02 ENCOUNTER — Encounter: Payer: BLUE CROSS/BLUE SHIELD | Admitting: Obstetrics and Gynecology

## 2018-02-02 VITALS — BP 118/80 | Wt 181.0 lb

## 2018-02-02 DIAGNOSIS — O34219 Maternal care for unspecified type scar from previous cesarean delivery: Secondary | ICD-10-CM | POA: Diagnosis not present

## 2018-02-02 DIAGNOSIS — Z3A36 36 weeks gestation of pregnancy: Secondary | ICD-10-CM

## 2018-02-02 DIAGNOSIS — Z23 Encounter for immunization: Secondary | ICD-10-CM | POA: Diagnosis not present

## 2018-02-02 DIAGNOSIS — O0990 Supervision of high risk pregnancy, unspecified, unspecified trimester: Secondary | ICD-10-CM

## 2018-02-02 DIAGNOSIS — O24414 Gestational diabetes mellitus in pregnancy, insulin controlled: Secondary | ICD-10-CM | POA: Diagnosis not present

## 2018-02-02 LAB — POCT URINALYSIS DIPSTICK OB
Glucose, UA: NEGATIVE
POC,PROTEIN,UA: NEGATIVE

## 2018-02-02 MED ORDER — TETANUS-DIPHTH-ACELL PERTUSSIS 5-2.5-18.5 LF-MCG/0.5 IM SUSP: 0.5000 mL | Freq: Once | INTRAMUSCULAR | Status: DC

## 2018-02-02 NOTE — Addendum Note (Signed)
Addended by: Cornelius MorasPATTERSON, Zanden Colver D on: 02/02/2018 11:23 AM   Modules accepted: Orders

## 2018-02-02 NOTE — Addendum Note (Signed)
Addended by: Cornelius MorasPATTERSON, Stpehen Petitjean D on: 02/02/2018 11:04 AM   Modules accepted: Orders

## 2018-02-02 NOTE — Progress Notes (Addendum)
Prenatal Visit Note Date: 02/02/2018 Clinic: [facility]  Subjective:  Denise Walker is a 27 y.o. [id number] at 4732w1d being seen today for ongoing prenatal care.  She is currently monitored for the following issues for this high-risk pregnancy and has Supervision of high risk pregnancy, antepartum; History of eclampsia; Rh negative state in antepartum period; Pregnancy with history of cesarean section, antepartum; Gestational diabetes mellitus (GDM) requiring insulin; and Indication for care in labor and delivery, antepartum on their problem list.  Patient reports no complaints.   Contractions: Not present. Vag. Bleeding: None.  Movement: Present. Denies leaking of fluid.   The following portions of the patient's history were reviewed and updated as appropriate: allergies, current medications, past family history, past medical history, past social history, past surgical history and problem list. Problem list updated.  Objective:   Vitals:   02/02/18 1025  BP: 118/80  Weight: 181 lb (82.1 kg)    Fetal Status:     Movement: Present     General:  Alert, oriented and cooperative. Patient is in no acute distress.  Skin: Skin is warm and dry. No rash noted.   Cardiovascular: Normal heart rate noted  Respiratory: Normal respiratory effort, no problems with respiration noted  Abdomen: Soft, gravid, appropriate for gestational age. Pain/Pressure: Present     Pelvic:  Cervical exam performed        Extremities: Normal range of motion.     Mental Status: Normal mood and affect. Normal behavior. Normal judgment and thought content.  A NST procedure was performed with FHR monitoring and a normal baseline established, appropriate time of 20-40 minutes of evaluation, and accels >2 seen w 15x15 characteristics.  Results show a REACTIVE NST.   Assessment and Plan:  Pregnancy: G5P1031 at 5932w1d  1. [redacted] weeks gestation of pregnancy - Culture, beta strep (group b only) nv per pt request - TDaP today  2.  Gestational diabetes mellitus (GDM) requiring insulin - Cont Insulin regimen.     About 50% normal, cont to monitor levels, no emergent levels - US OB Limited; for AFI nv  3. Supervision of high risk pregnancy, antepartum Plan CS at 39 weeks  4. Pregnancy with history of cesarean section, antepartum CS, no VBAC  Term labor symptoms and general obstetric precautions including but not limited to vaginal bleeding, contractions, leaking of fluid and fetal movement were reviewed in detail with the patient. Please refer to After Visit Summary for other counseling recommendations.  Return in about 1 week (around 02/09/2018) for HROB w NST 12/30 (JG) and HROB w NST and US 02/12/18 (CS).  Annamarie MajorPaul Johanne Mcglade, MD, Merlinda FrederickFACOG [facility], [facility] 02/02/2018  10:47 AM

## 2018-02-05 ENCOUNTER — Other Ambulatory Visit: Payer: BLUE CROSS/BLUE SHIELD

## 2018-02-05 ENCOUNTER — Encounter: Payer: BLUE CROSS/BLUE SHIELD | Admitting: Obstetrics and Gynecology

## 2018-02-05 ENCOUNTER — Other Ambulatory Visit: Payer: Self-pay

## 2018-02-05 ENCOUNTER — Ambulatory Visit (INDEPENDENT_AMBULATORY_CARE_PROVIDER_SITE_OTHER): Payer: BLUE CROSS/BLUE SHIELD

## 2018-02-05 ENCOUNTER — Observation Stay
Admission: EM | Admit: 2018-02-05 | Discharge: 2018-02-05 | Disposition: A | Discharge status: Home / Self Care | Payer: BLUE CROSS/BLUE SHIELD | Attending: Obstetrics and Gynecology | Admitting: Obstetrics and Gynecology

## 2018-02-05 ENCOUNTER — Ambulatory Visit (INDEPENDENT_AMBULATORY_CARE_PROVIDER_SITE_OTHER): Payer: BLUE CROSS/BLUE SHIELD | Admitting: Obstetrics and Gynecology

## 2018-02-05 VITALS — BP 99/66 | Wt 183.0 lb

## 2018-02-05 DIAGNOSIS — Z7982 Long term (current) use of aspirin: Secondary | ICD-10-CM | POA: Diagnosis not present

## 2018-02-05 DIAGNOSIS — O0990 Supervision of high risk pregnancy, unspecified, unspecified trimester: Secondary | ICD-10-CM

## 2018-02-05 DIAGNOSIS — Z3A36 36 weeks gestation of pregnancy: Secondary | ICD-10-CM

## 2018-02-05 DIAGNOSIS — Z8759 Personal history of other complications of pregnancy, childbirth and the puerperium: Secondary | ICD-10-CM

## 2018-02-05 DIAGNOSIS — Z3685 Encounter for antenatal screening for Streptococcus B: Secondary | ICD-10-CM | POA: Diagnosis not present

## 2018-02-05 DIAGNOSIS — Z7951 Long term (current) use of inhaled steroids: Secondary | ICD-10-CM | POA: Insufficient documentation

## 2018-02-05 DIAGNOSIS — O26893 Other specified pregnancy related conditions, third trimester: Secondary | ICD-10-CM

## 2018-02-05 DIAGNOSIS — O09293 Supervision of pregnancy with other poor reproductive or obstetric history, third trimester: Secondary | ICD-10-CM

## 2018-02-05 DIAGNOSIS — Z794 Long term (current) use of insulin: Secondary | ICD-10-CM | POA: Diagnosis not present

## 2018-02-05 DIAGNOSIS — O24414 Gestational diabetes mellitus in pregnancy, insulin controlled: Secondary | ICD-10-CM | POA: Diagnosis not present

## 2018-02-05 DIAGNOSIS — O26899 Other specified pregnancy related conditions, unspecified trimester: Secondary | ICD-10-CM

## 2018-02-05 DIAGNOSIS — O34219 Maternal care for unspecified type scar from previous cesarean delivery: Secondary | ICD-10-CM | POA: Diagnosis not present

## 2018-02-05 DIAGNOSIS — Z6791 Unspecified blood type, Rh negative: Secondary | ICD-10-CM

## 2018-02-05 LAB — POCT URINALYSIS DIPSTICK OB
Glucose, UA: NEGATIVE
POC,PROTEIN,UA: NEGATIVE

## 2018-02-05 MED ORDER — INSULIN DETEMIR 100 UNIT/ML ~~LOC~~ SOLN: 38.0000 [IU] | Freq: Every day | SUBCUTANEOUS | 4 refills | Status: DC

## 2018-02-05 NOTE — Final Progress Note (Signed)
Physician Final Progress Note  Patient ID: Denise Walker MRN: [id number] DOB/AGE: 09-May-1990 27 y.o.  Admit date: 02/05/2018 Admitting provider: Conard Novak, MD Discharge date: 02/05/2018   Admission Diagnoses:  1) intrauterine pregnancy at [redacted]w[redacted]d  2) uncontrolled gestational diabetes, requiring insulin 3) non-reactive antenatal fetal testing in clinic today (Non-stress test)  Discharge Diagnoses:  1) intrauterine pregnancy at [redacted]w[redacted]d  2) uncontrolled gestational diabetes, requiring insulin 3) Reactive Non-stress test  History of Present Illness: The patient is a 27 y.o. female [id number] at [redacted]w[redacted]d who presents for follow up of an non-reactive fetal stress test in clinic today.  It appears that it was difficult to keep the baby on the monitor and, therefore, she was unable to have a reactive tracing.  She also was noted to have uncontrolled home blood glucose numbers.  She notes +FM, no LOF, no vaginal bleeding and no contractions.  To date, she has had normal fetal growth and normal amniotic fluid levels on ultrasound.    Past Medical History:  Diagnosis Date  . Gestational diabetes   . PCOS (polycystic ovarian syndrome)     Past Surgical History:  Procedure Laterality Date  . CESAREAN SECTION N/A 07/15/2014   Procedure: CESAREAN SECTION;  Surgeon: Nadara Mustard, MD;  Location: [facility] ORS;  Service: Obstetrics;  Laterality: N/A;  . HERNIA REPAIR  10/2013  . WISDOM TOOTH EXTRACTION      No current facility-administered medications on file prior to encounter.    Current Outpatient Medications on File Prior to Encounter  Medication Sig Dispense Refill  . ferrous sulfate (FERROUSUL) 325 (65 FE) MG tablet Take 1 tablet (325 mg total) by mouth daily. 30 tablet 4  . insulin aspart (NOVOLOG) 100 UNIT/ML FlexPen Inject 2 Units into the skin 3 (three) times daily with meals. (Patient taking differently: Inject 8 Units into the skin 3 (three) times daily with meals. ) 15 mL 11  .  Prenatal MV-Min-Fe Fum-FA-DHA (PRENATAL 1 PO) Take 1 tablet by mouth daily.    Marland Kitchen ACCU-CHEK FASTCLIX LANCETS MISC 1 Units by Percutaneous route 4 (four) times daily. 100 each 12  . aspirin EC 81 MG tablet Take 81 mg by mouth daily.    Marland Kitchen escitalopram (LEXAPRO) 10 MG tablet Take 1 tablet (10 mg total) by mouth daily. 30 tablet 6  . fluticasone (FLONASE) 50 MCG/ACT nasal spray 2 sprays by Each Nare route Two (2) times a day.    Marland Kitchen glucose blood (ACCU-CHEK GUIDE) test strip Use as instructed 100 each 12  . Insulin Pen Needle (NOVOFINE) 32G X 6 MM MISC Use as directed 100 each 3  . Insulin Syringe-Needle U-100 29G 0.5 ML MISC Use as directed 100 each 3  . metFORMIN (GLUCOPHAGE) 500 MG tablet Take 1 tablet (500 mg total) by mouth 2 (two) times daily. (Patient not taking: Reported on 02/05/2018) 60 tablet 6  . Triamcinolone Acetonide 0.025 % LOTN Apply 1 application topically 2 (two) times daily. (Patient not taking: Reported on 02/05/2018) 60 mL 0    Allergies  Allergen Reactions  . Fish Allergy Shortness Of Breath    Social History   Socioeconomic History  . Marital status: Married    Spouse name: Not on file  . Number of children: Not on file  . Years of education: Not on file  . Highest education level: Not on file  Occupational History  . Not on file  Social Needs  . Financial resource strain: Not hard at all  . Food insecurity:  Worry: Never true    Inability: Never true  . Transportation needs:    Medical: No    Non-medical: No  Tobacco Use  . Smoking status: Never Smoker  . Smokeless tobacco: Never Used  Substance and Sexual Activity  . Alcohol use: No  . Drug use: No  . Sexual activity: Yes    Birth control/protection: Other-see comments    Comment: will discuss with MD  Lifestyle  . Physical activity:    Days per week: 0 days    Minutes per session: 0 min  . Stress: To some extent  Relationships  . Social connections:    Talks on phone: More than three times a  week    Gets together: Three times a week    Attends religious service: Never    Active member of club or organization: No    Attends meetings of clubs or organizations: Never    Relationship status: Married  . Intimate partner violence:    Fear of current or ex partner: No    Emotionally abused: No    Physically abused: No    Forced sexual activity: No  Other Topics Concern  . Not on file  Social History Narrative  . Not on file    Family History  Problem Relation Age of Onset  . Cancer Mother   . Diabetes Paternal Grandfather   . Diabetes Paternal Aunt      Review of Systems  Constitutional: Negative.   HENT: Negative.   Eyes: Negative.   Respiratory: Negative.   Cardiovascular: Negative.   Gastrointestinal: Negative.   Genitourinary: Negative.   Musculoskeletal: Negative.   Skin: Negative.   Neurological: Negative.   Psychiatric/Behavioral: Negative.      Physical Exam: BP 107/64 (BP Location: Left Arm)   Pulse 99   Temp 98.2 F (36.8 C) (Oral)   Resp 18   Ht 4\' 11"  (1.499 m)   Wt 83 kg   LMP 05/25/2017 (Exact Date)   BMI 36.96 kg/m   Physical Exam Constitutional:      General: She is not in acute distress.    Appearance: Normal appearance.  HENT:     Head: Normocephalic and atraumatic.  Eyes:     General: No scleral icterus.    Conjunctiva/sclera: Conjunctivae normal.  Musculoskeletal: Normal range of motion.        General: No swelling.  Neurological:     General: No focal deficit present.     Mental Status: She is alert and oriented to person, place, and time.     Cranial Nerves: No cranial nerve deficit.  Skin:    General: Skin is warm and dry.  Psychiatric:        Mood and Affect: Mood normal.        Behavior: Behavior normal.        Thought Content: Thought content normal.        Judgment: Judgment normal.     Consults: None  Significant Findings/ Diagnostic Studies: none  Procedures:  NST Baseline FHR: 120  beats/min Variability: moderate Accelerations: present Decelerations: absent Tocometry: quiet  Interpretation:  INDICATIONS: gestational diabetes mellitus RESULTS:  A NST procedure was performed with FHR monitoring and a normal baseline established, appropriate time of 20-40 minutes of evaluation, and accels >2 seen w 15x15 characteristics.  Results show a REACTIVE NST.    Hospital Course: The patient was admitted to Labor and Delivery Triage for observation. She had no complaints. Her 2 hour post-prandial blood glucose  here was 132.  A long discussion regarding blood glucose control was had. She states that she really didn't change her diet much around Christmas. However, this week her blood glucose values have been significantly elevated. We discussed the risks of uncontrolled blood glucose values, including fetal death (stillbirth).  She had an easily reactive tracing here on L&D.  Discussion regarding admission for blood glucose control was had.  The decision was made to discharge patient home with increase bed-time, long-acting insulin to 38 units (from 32). She is to send me her BG log in 2 days time. She understands that she might need admission at that time, if her blood glucose is not improved.  She might also need delivery. She was discharged home in stable condition.    Discharge Condition: stable  Disposition: Discharge disposition: 01-Home or Self Care       Diet: Diabetic diet  Discharge Activity: Activity as tolerated   Allergies as of 02/05/2018      Reactions   Fish Allergy Shortness Of Breath      Medication List    STOP taking these medications   metFORMIN 500 MG tablet Commonly known as:  GLUCOPHAGE   Triamcinolone Acetonide 0.025 % Lotn     TAKE these medications   ACCU-CHEK FASTCLIX LANCETS Misc 1 Units by Percutaneous route 4 (four) times daily.   aspirin EC 81 MG tablet Take 81 mg by mouth daily.   escitalopram 10 MG tablet Commonly known as:   LEXAPRO Take 1 tablet (10 mg total) by mouth daily.   ferrous sulfate 325 (65 FE) MG tablet Commonly known as:  FERROUSUL Take 1 tablet (325 mg total) by mouth daily.   fluticasone 50 MCG/ACT nasal spray Commonly known as:  FLONASE 2 sprays by Each Nare route Two (2) times a day.   glucose blood test strip Commonly known as:  ACCU-CHEK GUIDE Use as instructed   insulin aspart 100 UNIT/ML FlexPen Commonly known as:  NOVOLOG Inject 2 Units into the skin 3 (three) times daily with meals. What changed:  how much to take   insulin detemir 100 UNIT/ML injection Commonly known as:  LEVEMIR Inject 0.38 mLs (38 Units total) into the skin at bedtime. What changed:  See the new instructions.   Insulin Pen Needle 32G X 6 MM Misc Commonly known as:  NOVOFINE Use as directed   Insulin Syringe-Needle U-100 29G 0.5 ML Misc Use as directed   PRENATAL 1 PO Take 1 tablet by mouth daily.        Total time spent taking care of this patient: 45 minutes  Signed: Thomasene MohairStephen Tyesha Joffe, MD  02/05/2018, 10:39 PM

## 2018-02-05 NOTE — OB Triage Note (Signed)
Patient arrived in triage, sent over from office by Dr. Bonney AidStaebler for non reactive NST and blood sugar control. EFM applied and assessing. Fetal movement noted when applying monitors. Patient reports good fetal movement. Denies contractions at this time. Denies leaking fluid or vaginal bleeding.

## 2018-02-05 NOTE — Progress Notes (Signed)
Routine Prenatal Care Visit  Subjective  Denise Walker is a 27 y.o. G5P1031 at 9132w4d being seen today for ongoing prenatal care.  She is currently monitored for the following issues for this high-risk pregnancy and has Supervision of high risk pregnancy, antepartum; History of eclampsia; Rh negative state in antepartum period; Pregnancy with history of cesarean section, antepartum; Gestational diabetes mellitus (GDM) requiring insulin; and Indication for care in labor and delivery, antepartum on their problem list.  ----------------------------------------------------------------------------------- Patient reports no complaints.   Contractions: Irregular. Vag. Bleeding: None.  Movement: Present. Denies leaking of fluid.  ----------------------------------------------------------------------------------- The following portions of the patient's history were reviewed and updated as appropriate: allergies, current medications, past family history, past medical history, past social history, past surgical history and problem list. Problem list updated.   Objective  Blood pressure 99/66, weight 183 lb (83 kg), last menstrual period 05/25/2017. Pregravid weight 186 lb (84.4 kg) Total Weight Gain -3 lb (-1.361 kg) Urinalysis:      Fetal Status: Fetal Heart Rate (bpm): 139   Movement: Present  Presentation: Vertex  General:  Alert, oriented and cooperative. Patient is in no acute distress.  Skin: Skin is warm and dry. No rash noted.   Cardiovascular: Normal heart rate noted  Respiratory: Normal respiratory effort, no problems with respiration noted  Abdomen: Soft, gravid, appropriate for gestational age. Pain/Pressure: Present     Pelvic:  Cervical exam performed Dilation: Closed      Extremities: Normal range of motion.     ental Status: Normal mood and affect. Normal behavior. Normal judgment and thought content.   date Fasting  breakfast lunch dinner  12/21 95 131 117 139  12/22 96 128  113 170  12/23 103 160 114 186  12/24 108 166 130 204  12/25 110 128 190 216  12/26 106 117 131    [place] Ob Limited  Result Date: 02/05/2018 Patient Name: Denise Walker DOB: 1990/08/22 MRN: [id number] ULTRASOUND REPORT Location: [facility] Date of Service: 02/05/2018 Indications:AFI Findings: Mason JimSingleton intrauterine pregnancy is visualized with FHR at 127 BPM. Fetal presentation is Cephalic. Placenta: anterior/thick appearing. Grade: 2. AFI: 10.6 cm Impression: 1. 5932w4d Viable Singleton Intrauterine pregnancy dated by previously established criteria. 2. AFI is 10.6 cm. Recommendations: 1.Clinical correlation with the patient's History and Physical Exam. Darlina GuysAbby M Clarke, RDMS RVT There is a singleton gestation with normal amniotic fluid volume. TThe visualized fetal anatomical survey appears within normal limits within the resolution of ultrasound as described above.  It must be noted that a normal ultrasound is unable to rule out fetal aneuploidy.  Vena AustriaAndreas Undra Harriman, MD, Merlinda FrederickFACOG [facility], [facility] 02/05/2018, 5:29 PM   [place] Ob Limited  Result Date: 01/20/2018 Patient Name: Denise Walker DOB: 1990/08/22 MRN: [id number] ULTRASOUND REPORT Location: [facility] Date of Service: 01/20/2018 Indications:AFI Findings: Mason JimSingleton intrauterine pregnancy is visualized with FHR at 127 BPM. Fetal presentation is Cephalic. Placenta: anterior. Grade: 1 AFI: 9.0 cm Impression: 1. 1441w2d Viable Singleton Intrauterine pregnancy dated by previously established criteria. 2. AFI is 9.0 cm. Darlina GuysAbby M Clarke, RDMS RVT The ultrasound images and findings were reviewed by me and I agree with the above report. Thomasene MohairStephen Jackson, MD, Merlinda FrederickFACOG [facility], [facility] 01/20/2018 3:16 PM     [place] Ob Limited  Result Date: 01/13/2018 Patient Name: Denise Walker DOB: 1990/08/22 MRN: [id number] ULTRASOUND REPORT Location: [facility] Date of Service: 01/13/2018 Indications:AFI Findings: Mason JimSingleton  intrauterine pregnancy is visualized with FHR at 141 BPM. Fetal  presentation is Cephalic. Placenta: anterior. Grade: 1 AFI: 11.7 cm Impression: 1. [redacted]w[redacted]d Viable Singleton Intrauterine pregnancy dated by previously established criteria. 2. AFI is 11.7 cm. Recommendations: 1.Clinical correlation with the patient's History and Physical Exam. Darlina Guys, RDMS RVT There is a singleton gestation with normal amniotic fluid volume. The visualized fetal anatomy appears within normal limits within the resolution of ultrasound as described above.  It must be noted that a normal ultrasound is unable to rule out fetal aneuploidy.  Vena Austria, MD, [facility] Frederick [facility], [facility] 01/13/2018, 1:27 PM   US Ob Follow Up  Result Date: 01/29/2018 Patient Name: Denise Walker DOB: 1990-03-27 MRN: [id number] ULTRASOUND REPORT Location: [facility] Date of Service: 01/29/2018 Indications:growth/afi Findings: Mason Jim intrauterine pregnancy is visualized with FHR at 126 BPM. Biometrics give an (U/S) Gestational age of [redacted]w[redacted]d and an (U/S) EDD of 03/05/2018; this correlates with the clinically established Estimated Date of Delivery: 03/01/18. Fetal presentation is Cephalic. Placenta: anterior. Grade: 1. Placenta appears somewhat thick. Measuring 5.7cm vertically. AFI: 12.5 cm Growth percentile is 42.9. EFW: 2,665g (5lbs 14oz) Impression: 1. [redacted]w[redacted]d Viable Singleton Intrauterine pregnancy previously established criteria. 2. Growth is 42.9 %ile.  AFI is 12.5 cm. 3. Thick appearing placenta. Recommendations: 1.Clinical correlation with the patient's History and Physical Exam. Darlina Guys, RDMS RVT There is a singleton gestation with normal amniotic fluid volume. The fetal biometry correlates with established dating.  Limited fetal anatomy was performed.The visualized fetal anatomical survey appears within normal limits within the resolution of ultrasound as described above.  It must be noted that a normal ultrasound  is unable to rule out fetal aneuploidy.  Vena Austria, MD, Evern Core [facility], [facility] 01/29/2018, 2:03 PM    Assessment   27 y.o. [id number] at [redacted]w[redacted]d by  03/01/2018, by Last Menstrual Period presenting for routine prenatal visit  Plan   Pregnacy#5 Problems (from 05/25/17 to present)    Problem Noted Resolved   Supervision of high risk pregnancy, antepartum 07/17/2017 by Vena Austria, MD No   Priority:  High     Overview Addendum 12/04/2017 11:00 AM by Vena Austria, MD    Clinic [facility] Prenatal Labs  Dating LMP = [redacted]w[redacted]d [place] Blood type: O negative  Genetic Screen NIPS: normal XX SMA neg, CF neg, FragileX neg Antibody:negative  Anatomic [place] Normal Rubella: Equivocal Varicella: Immune  GTT (GDM G1) Early: HgbA1C 5.5, 158 3-hr: 99 / 181 / 131 / 73 RPR: NR  Rhogam 12/04/17 HBsAg: negative  TDaP vaccine                  Flu Shot: 10/23/17 HIV: negative  Baby Food                                GBS:   Contraception  Pap: 07/22/17 NIL  CBB     CS/VBAC C-section [ ]  schedule at 30 weeks   Support Person Cody           History of eclampsia 07/22/2017 by Vena Austria, MD No   Priority:  Medium     Overview Addendum 09/03/2017  5:46 PM by Vena Austria, MD    [X]  Aspirin 81 mg daily after 12 weeks; discontinue after 36 weeks  Baseline and surveillance labs (pulled in from [facility], refresh links as needed)  Lab Results  Component Value Date   PLT 292 07/22/2017   CREATININE 0.73 07/22/2017  AST 16 07/22/2017   ALT 13 07/22/2017   PROTCRRATIO 69 07/16/2014           Gestational diabetes mellitus (GDM) requiring insulin 09/08/2017 by Vena AustriaStaebler, Jaimie Redditt, MD No   Overview Addendum 01/13/2018  1:53 PM by Vena AustriaStaebler, Hodan Wurtz, MD    Current Diabetic Medications:  None (was on metformin PCOS)   [X]  Diabetes Education and Testing Supplies [X]  Nutrition Counsult [X]  Fetal ECHO after 22-24 weeks  [ ]  Delivery planning contingent on fetal growth, AFI,  glycemic control, and other co-morbidities but at least by 39 weeks  Levemir 24U qHS (01/13/18) Aspart 4U meals, 6U dinner (01/13/18)  [facility] refresh [facility] as needed)  Lab Results  Component Value Date   CREATININE 0.73 07/22/2017   AST 16 07/22/2017   ALT 13 07/22/2017   TSH 2.170 03/27/2017   PROTCRRATIO 1.16 (H) 07/16/2014   Lab Results  Component Value Date   HGBA1C 5.5 07/22/2017    Antenatal Testing Class of DM U/S NST/AFI DELIVERY  Diabetes   A1 - good control - O24.410    A2 - good control - O24.419      A2  - poor control or poor compliance - O24.419, E11.65   (Macrosomia or polyhydramnios) **E11.65 is extra code for poor control**    A2/B - O24.919  and B-C O24.319  Poor control B-C or D-R-F-T - O24.319  or  Type I DM - O24.019  20-38  20-38  20-24-28-32-36   20-24-28-32-35-38//fetal echo  20-24-27-30-33-36-38//fetal echo  40  32//2 x wk  32//2 x wk   32//2 x wk  28//BPP wkly then 32//2 x wk  40  39  PRN   39  PRN          Rh negative state in antepartum period 08/23/2017 by Vena AustriaStaebler, Areen Trautner, MD No   Overview Addendum 12/25/2017  2:39 PM by Vena AustriaStaebler, Darriel Sinquefield, MD    [X]  Rhogam 28 weeks - 12/04/17      Pregnancy with history of cesarean section, antepartum 08/23/2017 by Vena AustriaStaebler, Ronica Vivian, MD No   History of gestational diabetes in prior pregnancy, currently pregnant 08/23/2017 by Vena AustriaStaebler, Timira Bieda, MD 09/08/2017 by Vena AustriaStaebler, Adynn Caseres, MD       Gestational age appropriate obstetric precautions including but not limited to vaginal bleeding, contractions, leaking of fluid and fetal movement were reviewed in detail with the patient.    - Acute worsening in glycemic control - Unable to obtain reactive NST - to L&D may warrant admission for glycemic control given BG readings - GBS collected  Return in about 5 days (around 02/10/2018) for 12/31 ROB NST,  1/2 ROB/NST/AFI.  Vena AustriaAndreas Daryle Amis,  MD, Evern CoreFACOG [facility], [facility] 02/05/2018, 5:27 PM

## 2018-02-05 NOTE — Progress Notes (Signed)
No complaints

## 2018-02-05 NOTE — Discharge Summary (Signed)
See final progress note. 

## 2018-02-05 NOTE — OB Triage Note (Signed)
Patient given discharge instructions including follow up information, changes to medications, fetal kick counts, and preterm labor precautions. Patient verbalized understanding, all questions answered. Patient discharged ambulatory in stable condition accompanied by RN and significant other.

## 2018-02-06 ENCOUNTER — Other Ambulatory Visit: Payer: BLUE CROSS/BLUE SHIELD

## 2018-02-06 LAB — GLUCOSE, CAPILLARY: Glucose-Capillary: 132 mg/dL — ABNORMAL HIGH (ref 70–99)

## 2018-02-07 ENCOUNTER — Other Ambulatory Visit: Payer: Self-pay | Admitting: Obstetrics and Gynecology

## 2018-02-07 DIAGNOSIS — O24414 Gestational diabetes mellitus in pregnancy, insulin controlled: Secondary | ICD-10-CM

## 2018-02-07 DIAGNOSIS — O0990 Supervision of high risk pregnancy, unspecified, unspecified trimester: Secondary | ICD-10-CM

## 2018-02-07 MED ORDER — INSULIN DETEMIR 100 UNIT/ML ~~LOC~~ SOLN: 42.0000 [IU] | Freq: Every day | SUBCUTANEOUS | 0 refills | Status: DC

## 2018-02-08 LAB — STREP GP B NAA: Strep Gp B NAA: NEGATIVE

## 2018-02-09 ENCOUNTER — Other Ambulatory Visit: Payer: Self-pay | Admitting: Certified Nurse Midwife

## 2018-02-09 ENCOUNTER — Encounter: Payer: Self-pay | Admitting: Advanced Practice Midwife

## 2018-02-09 ENCOUNTER — Ambulatory Visit (INDEPENDENT_AMBULATORY_CARE_PROVIDER_SITE_OTHER): Payer: BLUE CROSS/BLUE SHIELD | Admitting: Advanced Practice Midwife

## 2018-02-09 VITALS — BP 128/74 | Wt 185.0 lb

## 2018-02-09 DIAGNOSIS — O34219 Maternal care for unspecified type scar from previous cesarean delivery: Secondary | ICD-10-CM | POA: Diagnosis not present

## 2018-02-09 DIAGNOSIS — O09293 Supervision of pregnancy with other poor reproductive or obstetric history, third trimester: Secondary | ICD-10-CM | POA: Diagnosis not present

## 2018-02-09 DIAGNOSIS — Z3A37 37 weeks gestation of pregnancy: Secondary | ICD-10-CM | POA: Diagnosis not present

## 2018-02-09 DIAGNOSIS — O24414 Gestational diabetes mellitus in pregnancy, insulin controlled: Secondary | ICD-10-CM

## 2018-02-09 LAB — POCT URINALYSIS DIPSTICK OB: POC,PROTEIN,UA: NEGATIVE

## 2018-02-09 MED ORDER — CEFAZOLIN SODIUM-DEXTROSE 2-4 GM/100ML-% IV SOLN
2.0000 g | INTRAVENOUS | Status: AC
  Administered 2018-02-10: 2 g via INTRAVENOUS
  Filled 2018-02-09: qty 100

## 2018-02-09 NOTE — Progress Notes (Signed)
ROB NST 

## 2018-02-09 NOTE — Pre-Procedure Instructions (Signed)
PATIENT CALLED WITH QUESTIONS ABOUT MEDS. INSTRUCTED HALF DOSE INSULIN HS AND NONE IN AM. NPO AFTER MN

## 2018-02-09 NOTE — Progress Notes (Signed)
Routine Prenatal Care Visit  Subjective  Denise Walker is a 27 y.o. G5P1031 at 3366w1d being seen today for ongoing prenatal care.  She is currently monitored for the following issues for this high-risk pregnancy and has Supervision of high risk pregnancy, antepartum; History of eclampsia; Rh negative state in antepartum period; Pregnancy with history of cesarean section, antepartum; Gestational diabetes mellitus (GDM) requiring insulin; Indication for care in labor and delivery, antepartum; Labor and delivery, indication for care; and Migraines on their problem list.  ----------------------------------------------------------------------------------- Patient reports blood sugars are primarily elevated with fasting upper 90s to 110s and postprandial 130s to 210. A few normal after breakfast and lunch. She is scheduled for c/section tomorrow and will have H&P with Dr Jean RosenthalJackson same day. Harriett Sineancy is coordinating OR timing.   Contractions: Not present. Vag. Bleeding: None.  Movement: Present. Denies leaking of fluid.  ----------------------------------------------------------------------------------- The following portions of the patient's history were reviewed and updated as appropriate: allergies, current medications, past family history, past medical history, past social history, past surgical history and problem list. Problem list updated.   Objective  Blood pressure 128/74, weight 185 lb (83.9 kg), last menstrual period 05/25/2017. Pregravid weight 186 lb (84.4 kg) Total Weight Gain -1 lb (-0.454 kg) Urinalysis: Urine Protein Negative  Urine Glucose (!) Small (1+)  Fetal Status: Fetal Heart Rate (bpm): 130   Movement: Present     NST reactive 130 bpm baseline, moderate variability, +accelerations, -decelerations  General:  Alert, oriented and cooperative. Patient is in no acute distress.  Skin: Skin is warm and dry. No rash noted.   Cardiovascular: Normal heart rate noted  Respiratory: Normal  respiratory effort, no problems with respiration noted  Abdomen: Soft, gravid, appropriate for gestational age. Pain/Pressure: Present     Pelvic:  Cervical exam deferred        Extremities: Normal range of motion.  Edema: None  Mental Status: Normal mood and affect. Normal behavior. Normal judgment and thought content.   Assessment   27 y.o. [id number] at 8266w1d by  03/01/2018, by Last Menstrual Period presenting for routine prenatal visit  Plan   Pregnacy#5 Problems (from 05/25/17 to present)    Problem Noted Resolved   Gestational diabetes mellitus (GDM) requiring insulin 09/08/2017 by Vena AustriaStaebler, Andreas, MD No   Overview Addendum 01/13/2018  1:53 PM by Vena AustriaStaebler, Andreas, MD    Current Diabetic Medications:  None (was on metformin PCOS)   [X]  Diabetes Education and Testing Supplies [X]  Nutrition Counsult [X]  Fetal ECHO after 22-24 weeks  [ ]  Delivery planning contingent on fetal growth, AFI, glycemic control, and other co-morbidities but at least by 39 weeks  Levemir 24U qHS (01/13/18) Aspart 4U meals, 6U dinner (01/13/18)  Baseline and surveillance labs [facility], refresh links as needed)  Lab Results  Component Value Date   CREATININE 0.73 07/22/2017   AST 16 07/22/2017   ALT 13 07/22/2017   TSH 2.170 03/27/2017   PROTCRRATIO 1.16 (H) 07/16/2014   Lab Results  Component Value Date   HGBA1C 5.5 07/22/2017    Antenatal Testing Class of DM U/S NST/AFI DELIVERY  Diabetes   A1 - good control - O24.410    A2 - good control - O24.419      A2  - poor control or poor compliance - O24.419, E11.65   (Macrosomia or polyhydramnios) **E11.65 is extra code for poor control**    A2/B - O24.919  and B-C O24.319  Poor control B-C or D-R-F-T - O24.319  or  Type I DM - O24.019  20-38  20-38  20-24-28-32-36   20-24-28-32-35-38//fetal echo  20-24-27-30-33-36-38//fetal echo  40  32//2 x wk  32//2 x wk   32//2 x wk  28//BPP wkly then 32//2 x wk   40  39  PRN   39  PRN          Rh negative state in antepartum period 08/23/2017 by Vena AustriaStaebler, Andreas, MD No   Overview Addendum 12/25/2017  2:39 PM by Vena AustriaStaebler, Andreas, MD    [X]  Rhogam 28 weeks - 12/04/17      Pregnancy with history of cesarean section, antepartum 08/23/2017 by Vena AustriaStaebler, Andreas, MD No   History of eclampsia 07/22/2017 by Vena AustriaStaebler, Andreas, MD No   Overview Addendum 09/03/2017  5:46 PM by Vena AustriaStaebler, Andreas, MD    [X]  Aspirin 81 mg daily after 12 weeks; discontinue after 36 weeks  [facility] as [facility])  Lab Results  Component Value Date   PLT 292 07/22/2017   CREATININE 0.73 07/22/2017   AST 16 07/22/2017   ALT 13 07/22/2017   PROTCRRATIO 69 07/16/2014           Supervision of high risk pregnancy, antepartum 07/17/2017 by Vena AustriaStaebler, Andreas, MD No   Overview Addendum 12/04/2017 11:00 AM by Vena AustriaStaebler, Andreas, MD    [facility]inic [facility] Prenatal Labs  Dating LMP = 2820w5d US Blood type: O negative  Genetic Screen NIPS: normal XX SMA neg, CF neg, FragileX neg Antibody:negative  Anatomic US Normal Rubella: Equivocal Varicella: Immune  GTT (GDM G1) Early: HgbA1C 5.5, 158 3-hr: 99 / 181 / 131 / 73 RPR: NR  Rhogam 12/04/17 HBsAg: negative  TDaP vaccine                  Flu Shot: 10/23/17 HIV: negative  Baby Food                                GBS:   Contraception  Pap: 07/22/17 NIL  CBB     CS/VBAC C-section [ ]  schedule at 30 weeks   Support Person Selena BattenCody           History of gestational diabetes in prior pregnancy, currently pregnant 08/23/2017 by Vena AustriaStaebler, Andreas, MD 09/08/2017 by Vena AustriaStaebler, Andreas, MD       Term labor symptoms and general obstetric precautions including but not limited to vaginal bleeding, contractions, leaking of fluid and fetal movement were reviewed in detail with the patient. Please refer to After Visit Summary for other counseling recommendations.   Return for c/section  tomorrow.  Tresea MallJane Deborha Moseley, CNM 02/09/2018 9:19 AM

## 2018-02-10 ENCOUNTER — Inpatient Hospital Stay: Payer: BLUE CROSS/BLUE SHIELD | Admitting: Anesthesiology

## 2018-02-10 ENCOUNTER — Encounter
Admission: EM | Disposition: A | Discharge status: Home / Self Care | Payer: Self-pay | Source: Home / Self Care | Attending: Obstetrics and Gynecology

## 2018-02-10 ENCOUNTER — Inpatient Hospital Stay
Admission: EM | Admit: 2018-02-10 | Discharge: 2018-02-12 | DRG: 787 | Disposition: A | Discharge status: Home / Self Care | Payer: BLUE CROSS/BLUE SHIELD | Attending: Obstetrics and Gynecology | Admitting: Obstetrics and Gynecology

## 2018-02-10 ENCOUNTER — Other Ambulatory Visit: Payer: Self-pay

## 2018-02-10 DIAGNOSIS — Z3A37 37 weeks gestation of pregnancy: Secondary | ICD-10-CM

## 2018-02-10 DIAGNOSIS — Z6791 Unspecified blood type, Rh negative: Secondary | ICD-10-CM | POA: Diagnosis not present

## 2018-02-10 DIAGNOSIS — O0990 Supervision of high risk pregnancy, unspecified, unspecified trimester: Secondary | ICD-10-CM

## 2018-02-10 DIAGNOSIS — D62 Acute posthemorrhagic anemia: Secondary | ICD-10-CM | POA: Diagnosis not present

## 2018-02-10 DIAGNOSIS — O34219 Maternal care for unspecified type scar from previous cesarean delivery: Secondary | ICD-10-CM | POA: Diagnosis not present

## 2018-02-10 DIAGNOSIS — O24424 Gestational diabetes mellitus in childbirth, insulin controlled: Secondary | ICD-10-CM | POA: Diagnosis present

## 2018-02-10 DIAGNOSIS — Z98891 History of uterine scar from previous surgery: Secondary | ICD-10-CM

## 2018-02-10 DIAGNOSIS — O26893 Other specified pregnancy related conditions, third trimester: Secondary | ICD-10-CM | POA: Diagnosis not present

## 2018-02-10 DIAGNOSIS — O9081 Anemia of the puerperium: Secondary | ICD-10-CM | POA: Diagnosis not present

## 2018-02-10 DIAGNOSIS — O26899 Other specified pregnancy related conditions, unspecified trimester: Secondary | ICD-10-CM

## 2018-02-10 DIAGNOSIS — O24414 Gestational diabetes mellitus in pregnancy, insulin controlled: Secondary | ICD-10-CM

## 2018-02-10 DIAGNOSIS — O34211 Maternal care for low transverse scar from previous cesarean delivery: Principal | ICD-10-CM | POA: Diagnosis present

## 2018-02-10 DIAGNOSIS — Z8759 Personal history of other complications of pregnancy, childbirth and the puerperium: Secondary | ICD-10-CM

## 2018-02-10 HISTORY — DX: History of uterine scar from previous surgery: Z98.891

## 2018-02-10 LAB — CBC
HCT: 35.3 % — ABNORMAL LOW (ref 36.0–46.0)
Hemoglobin: 11.7 g/dL — ABNORMAL LOW (ref 12.0–15.0)
MCH: 30.6 pg (ref 26.0–34.0)
MCHC: 33.1 g/dL (ref 30.0–36.0)
MCV: 92.4 fL (ref 80.0–100.0)
Platelets: 184 10*3/uL (ref 150–400)
RBC: 3.82 MIL/uL — ABNORMAL LOW (ref 3.87–5.11)
RDW: 15.1 % (ref 11.5–15.5)
WBC: 12.3 10*3/uL — ABNORMAL HIGH (ref 4.0–10.5)
nRBC: 0 % (ref 0.0–0.2)

## 2018-02-10 LAB — GLUCOSE, CAPILLARY
GLUCOSE-CAPILLARY: 98 mg/dL (ref 70–99)
Glucose-Capillary: 63 mg/dL — ABNORMAL LOW (ref 70–99)
Glucose-Capillary: 124 mg/dL — ABNORMAL HIGH (ref 70–99)

## 2018-02-10 LAB — RAPID HIV SCREEN (HIV 1/2 AB+AG)
HIV 1/2 Antibodies: NONREACTIVE
HIV-1 P24 Antigen - HIV24: NONREACTIVE

## 2018-02-10 SURGERY — Surgical Case
Anesthesia: Spinal

## 2018-02-10 MED ORDER — NALBUPHINE HCL 10 MG/ML IJ SOLN
5.0000 mg | Freq: Once | INTRAMUSCULAR | Status: AC | PRN
  Administered 2018-02-10: 5 mg via INTRAVENOUS
  Filled 2018-02-10: qty 0.5

## 2018-02-10 MED ORDER — ONDANSETRON HCL 4 MG/2ML IJ SOLN: 4.0000 mg | Freq: Once | INTRAMUSCULAR | Status: DC | PRN

## 2018-02-10 MED ORDER — FENTANYL CITRATE (PF) 100 MCG/2ML IJ SOLN: 25.0000 ug | INTRAMUSCULAR | Status: DC | PRN

## 2018-02-10 MED ORDER — ONDANSETRON HCL 4 MG/2ML IJ SOLN: 4.0000 mg | Freq: Three times a day (TID) | INTRAMUSCULAR | Status: DC | PRN

## 2018-02-10 MED ORDER — COCONUT OIL OIL
1.0000 "application " | TOPICAL_OIL | Status: DC | PRN
  Filled 2018-02-10: qty 120

## 2018-02-10 MED ORDER — FENTANYL CITRATE (PF) 100 MCG/2ML IJ SOLN
INTRAMUSCULAR | Status: DC | PRN
  Administered 2018-02-10: 15 ug via INTRAVENOUS

## 2018-02-10 MED ORDER — SOD CITRATE-CITRIC ACID 500-334 MG/5ML PO SOLN
30.0000 mL | ORAL | Status: AC
  Administered 2018-02-10: 30 mL via ORAL
  Filled 2018-02-10: qty 15

## 2018-02-10 MED ORDER — MEASLES, MUMPS & RUBELLA VAC IJ SOLR
0.5000 mL | Freq: Once | INTRAMUSCULAR | Status: DC
  Filled 2018-02-10 (×2): qty 0.5

## 2018-02-10 MED ORDER — ESCITALOPRAM OXALATE 10 MG PO TABS
10.0000 mg | ORAL_TABLET | Freq: Every day | ORAL | Status: DC
  Filled 2018-02-10: qty 1

## 2018-02-10 MED ORDER — DIPHENHYDRAMINE HCL 50 MG/ML IJ SOLN: 12.5000 mg | INTRAMUSCULAR | Status: DC | PRN

## 2018-02-10 MED ORDER — OXYCODONE HCL 5 MG PO TABS
5.0000 mg | ORAL_TABLET | ORAL | Status: DC | PRN
  Administered 2018-02-11 (×2): 5 mg via ORAL
  Filled 2018-02-10 (×4): qty 1

## 2018-02-10 MED ORDER — BUPIVACAINE HCL (PF) 0.5 % IJ SOLN: 5.0000 mL | Freq: Once | INTRAMUSCULAR | Status: DC

## 2018-02-10 MED ORDER — MORPHINE SULFATE (PF) 0.5 MG/ML IJ SOLN
INTRAMUSCULAR | Status: DC | PRN
  Administered 2018-02-10: .2 mg via EPIDURAL

## 2018-02-10 MED ORDER — NALBUPHINE HCL 10 MG/ML IJ SOLN
5.0000 mg | INTRAMUSCULAR | Status: DC | PRN
  Filled 2018-02-10: qty 1
  Filled 2018-02-10: qty 0.5

## 2018-02-10 MED ORDER — OXYTOCIN 40 UNITS IN LACTATED RINGERS INFUSION - SIMPLE MED
INTRAVENOUS | Status: DC | PRN
  Administered 2018-02-10: 1000 mL via INTRAVENOUS

## 2018-02-10 MED ORDER — OXYTOCIN 40 UNITS IN LACTATED RINGERS INFUSION - SIMPLE MED
INTRAVENOUS | Status: AC
  Filled 2018-02-10: qty 1000

## 2018-02-10 MED ORDER — OXYTOCIN 40 UNITS IN LACTATED RINGERS INFUSION - SIMPLE MED
2.5000 [IU]/h | INTRAVENOUS | Status: AC
  Administered 2018-02-10 (×2): 2.5 [IU]/h via INTRAVENOUS
  Filled 2018-02-10: qty 1000

## 2018-02-10 MED ORDER — BUPIVACAINE HCL (PF) 0.5 % IJ SOLN
INTRAMUSCULAR | Status: DC | PRN
  Administered 2018-02-10: 2.5 mL

## 2018-02-10 MED ORDER — DIPHENHYDRAMINE HCL 25 MG PO CAPS
25.0000 mg | ORAL_CAPSULE | Freq: Four times a day (QID) | ORAL | Status: DC | PRN
  Administered 2018-02-11 (×2): 25 mg via ORAL
  Filled 2018-02-10 (×2): qty 1

## 2018-02-10 MED ORDER — MEPERIDINE HCL 50 MG/ML IJ SOLN: 6.2500 mg | INTRAMUSCULAR | Status: DC | PRN

## 2018-02-10 MED ORDER — PHENYLEPHRINE HCL 10 MG/ML IJ SOLN
INTRAMUSCULAR | Status: DC | PRN
  Administered 2018-02-10: 100 ug via INTRAVENOUS

## 2018-02-10 MED ORDER — DIPHENHYDRAMINE HCL 50 MG/ML IJ SOLN
25.0000 mg | Freq: Once | INTRAMUSCULAR | Status: AC
  Administered 2018-02-10: 25 mg via INTRAVENOUS
  Filled 2018-02-10: qty 1

## 2018-02-10 MED ORDER — SIMETHICONE 80 MG PO CHEW
80.0000 mg | CHEWABLE_TABLET | Freq: Three times a day (TID) | ORAL | Status: DC
  Administered 2018-02-11 – 2018-02-12 (×5): 80 mg via ORAL
  Filled 2018-02-10 (×5): qty 1

## 2018-02-10 MED ORDER — DIBUCAINE 1 % RE OINT: 1.0000 "application " | TOPICAL_OINTMENT | RECTAL | Status: DC | PRN

## 2018-02-10 MED ORDER — CARBOPROST TROMETHAMINE 250 MCG/ML IM SOLN
INTRAMUSCULAR | Status: AC
  Filled 2018-02-10: qty 1

## 2018-02-10 MED ORDER — SODIUM CHLORIDE 0.9% FLUSH: 3.0000 mL | INTRAVENOUS | Status: DC | PRN

## 2018-02-10 MED ORDER — MENTHOL 3 MG MT LOZG
1.0000 | LOZENGE | OROMUCOSAL | Status: DC | PRN
  Filled 2018-02-10: qty 9

## 2018-02-10 MED ORDER — MISOPROSTOL 200 MCG PO TABS
ORAL_TABLET | ORAL | Status: AC
  Filled 2018-02-10: qty 5

## 2018-02-10 MED ORDER — BUPIVACAINE HCL 0.5 % IJ SOLN
INTRAMUSCULAR | Status: DC | PRN
  Administered 2018-02-10: 10 mL

## 2018-02-10 MED ORDER — DIPHENHYDRAMINE HCL 50 MG/ML IJ SOLN
INTRAMUSCULAR | Status: AC
  Filled 2018-02-10: qty 1

## 2018-02-10 MED ORDER — SODIUM CHLORIDE 0.9 % IV SOLN
INTRAVENOUS | Status: DC | PRN
  Administered 2018-02-10: 25 ug/min via INTRAVENOUS

## 2018-02-10 MED ORDER — NALOXONE HCL 0.4 MG/ML IJ SOLN
0.4000 mg | INTRAMUSCULAR | Status: DC | PRN
  Filled 2018-02-10: qty 1

## 2018-02-10 MED ORDER — PHENYLEPHRINE 40 MCG/ML (10ML) SYRINGE FOR IV PUSH (FOR BLOOD PRESSURE SUPPORT): PREFILLED_SYRINGE | INTRAVENOUS | Status: DC | PRN

## 2018-02-10 MED ORDER — MORPHINE SULFATE (PF) 0.5 MG/ML IJ SOLN
INTRAMUSCULAR | Status: AC
  Filled 2018-02-10: qty 10

## 2018-02-10 MED ORDER — PRENATAL MULTIVITAMIN CH
1.0000 | ORAL_TABLET | Freq: Every day | ORAL | Status: DC
  Administered 2018-02-11 – 2018-02-12 (×2): 1 via ORAL
  Filled 2018-02-10: qty 1

## 2018-02-10 MED ORDER — ROPIVACAINE HCL 5 MG/ML IJ SOLN: INTRAMUSCULAR | Status: DC | PRN

## 2018-02-10 MED ORDER — BUPIVACAINE HCL (PF) 0.5 % IJ SOLN
5.0000 mL | Freq: Once | INTRAMUSCULAR | Status: DC
  Filled 2018-02-10: qty 30

## 2018-02-10 MED ORDER — NALOXONE HCL 4 MG/10ML IJ SOLN
1.0000 ug/kg/h | INTRAVENOUS | Status: DC | PRN
  Filled 2018-02-10: qty 5

## 2018-02-10 MED ORDER — FERROUS SULFATE 325 (65 FE) MG PO TABS
325.0000 mg | ORAL_TABLET | Freq: Two times a day (BID) | ORAL | Status: DC
  Administered 2018-02-11 – 2018-02-12 (×3): 325 mg via ORAL
  Filled 2018-02-10 (×3): qty 1

## 2018-02-10 MED ORDER — NALBUPHINE HCL 10 MG/ML IJ SOLN
5.0000 mg | INTRAMUSCULAR | Status: DC | PRN
  Filled 2018-02-10: qty 0.5

## 2018-02-10 MED ORDER — DIPHENHYDRAMINE HCL 25 MG PO CAPS: 25.0000 mg | ORAL_CAPSULE | ORAL | Status: DC | PRN

## 2018-02-10 MED ORDER — DEXTROSE IN LACTATED RINGERS 5 % IV SOLN
INTRAVENOUS | Status: DC
  Administered 2018-02-10: 11:00:00 via INTRAVENOUS

## 2018-02-10 MED ORDER — SENNOSIDES-DOCUSATE SODIUM 8.6-50 MG PO TABS
2.0000 | ORAL_TABLET | ORAL | Status: DC
  Administered 2018-02-12: 2 via ORAL
  Filled 2018-02-10 (×2): qty 2

## 2018-02-10 MED ORDER — BUPIVACAINE 0.25 % ON-Q PUMP DUAL CATH 400 ML
400.0000 mL | INJECTION | Status: DC
  Filled 2018-02-10: qty 400

## 2018-02-10 MED ORDER — METHYLERGONOVINE MALEATE 0.2 MG/ML IJ SOLN
INTRAMUSCULAR | Status: AC
  Filled 2018-02-10: qty 1

## 2018-02-10 MED ORDER — FENTANYL CITRATE (PF) 100 MCG/2ML IJ SOLN
INTRAMUSCULAR | Status: AC
  Filled 2018-02-10: qty 2

## 2018-02-10 MED ORDER — LACTATED RINGERS IV BOLUS
1000.0000 mL | Freq: Once | INTRAVENOUS | Status: AC
  Administered 2018-02-10: 1000 mL via INTRAVENOUS

## 2018-02-10 MED ORDER — KETOROLAC TROMETHAMINE 30 MG/ML IJ SOLN
30.0000 mg | Freq: Four times a day (QID) | INTRAMUSCULAR | Status: DC | PRN
  Administered 2018-02-10: 30 mg via INTRAVENOUS
  Filled 2018-02-10 (×2): qty 1

## 2018-02-10 MED ORDER — NALBUPHINE HCL 10 MG/ML IJ SOLN
5.0000 mg | Freq: Once | INTRAMUSCULAR | Status: AC | PRN
  Filled 2018-02-10: qty 0.5

## 2018-02-10 MED ORDER — DIPHENHYDRAMINE HCL 50 MG/ML IJ SOLN
INTRAMUSCULAR | Status: DC | PRN
  Administered 2018-02-10 (×2): 12.5 mg via INTRAVENOUS

## 2018-02-10 MED ORDER — KETOROLAC TROMETHAMINE 30 MG/ML IJ SOLN
30.0000 mg | Freq: Four times a day (QID) | INTRAMUSCULAR | Status: DC | PRN
  Filled 2018-02-10: qty 1

## 2018-02-10 MED ORDER — CEFAZOLIN SODIUM-DEXTROSE 2-4 GM/100ML-% IV SOLN
2.0000 g | INTRAVENOUS | Status: DC
  Filled 2018-02-10: qty 100

## 2018-02-10 MED ORDER — WITCH HAZEL-GLYCERIN EX PADS: 1.0000 "application " | MEDICATED_PAD | CUTANEOUS | Status: DC | PRN

## 2018-02-10 MED ORDER — LACTATED RINGERS IV SOLN
INTRAVENOUS | Status: DC
  Administered 2018-02-11: 05:00:00 via INTRAVENOUS

## 2018-02-10 SURGICAL SUPPLY — 29 items
CANISTER SUCT 3000ML PPV (MISCELLANEOUS) ×2 IMPLANT
CATH KIT ON-Q SILVERSOAK 5IN (CATHETERS) ×4 IMPLANT
COVER WAND RF STERILE (DRAPES) IMPLANT
DERMABOND ADVANCED (GAUZE/BANDAGES/DRESSINGS) ×2
DERMABOND ADVANCED .7 DNX12 (GAUZE/BANDAGES/DRESSINGS) ×2 IMPLANT
DRSG OPSITE POSTOP 4X10 (GAUZE/BANDAGES/DRESSINGS) ×2 IMPLANT
DRSG TELFA 3X8 NADH (GAUZE/BANDAGES/DRESSINGS) ×2 IMPLANT
ELECT CAUTERY BLADE 6.4 (BLADE) ×2 IMPLANT
ELECT REM PT RETURN 9FT ADLT (ELECTROSURGICAL) ×2
ELECTRODE REM PT RTRN 9FT ADLT (ELECTROSURGICAL) ×1 IMPLANT
GAUZE SPONGE 4X4 12PLY STRL (GAUZE/BANDAGES/DRESSINGS) ×2 IMPLANT
GLOVE BIO SURGEON STRL SZ7 (GLOVE) ×4 IMPLANT
GLOVE INDICATOR 7.5 STRL GRN (GLOVE) ×4 IMPLANT
GOWN STRL REUS W/ TWL LRG LVL3 (GOWN DISPOSABLE) ×3 IMPLANT
GOWN STRL REUS W/TWL LRG LVL3 (GOWN DISPOSABLE) ×3
NS IRRIG 1000ML POUR BTL (IV SOLUTION) ×2 IMPLANT
PACK C SECTION AR (MISCELLANEOUS) ×2 IMPLANT
PAD OB MATERNITY 4.3X12.25 (PERSONAL CARE ITEMS) ×2 IMPLANT
PAD PREP 24X41 OB/GYN DISP (PERSONAL CARE ITEMS) ×2 IMPLANT
STRIP CLOSURE SKIN 1/2X4 (GAUZE/BANDAGES/DRESSINGS) ×2 IMPLANT
SUT MNCRL 4-0 (SUTURE) ×1
SUT MNCRL 4-0 27XMFL (SUTURE) ×1
SUT PDS AB 1 TP1 96 (SUTURE) ×2 IMPLANT
SUT PLAIN 2 0 XLH (SUTURE) ×2 IMPLANT
SUT PLAIN GUT 0 (SUTURE) IMPLANT
SUT VIC AB 0 CTX 36 (SUTURE) ×3
SUT VIC AB 0 CTX36XBRD ANBCTRL (SUTURE) ×3 IMPLANT
SUTURE MNCRL 4-0 27XMF (SUTURE) ×1 IMPLANT
SWABSTK COMLB BENZOIN TINCTURE (MISCELLANEOUS) ×2 IMPLANT

## 2018-02-10 NOTE — Anesthesia Procedure Notes (Signed)
Spinal  Patient location during procedure: OR Start time: 02/10/2018 11:50 AM End time: 02/10/2018 11:54 AM Staffing Anesthesiologist: Martha Clan, MD Resident/CRNA: Jonna Clark, CRNA Performed: anesthesiologist and resident/CRNA  Preanesthetic Checklist Completed: patient identified, site marked, surgical consent, pre-op evaluation, timeout performed, IV checked, risks and benefits discussed and monitors and equipment checked Spinal Block Patient position: sitting Prep: ChloraPrep Patient monitoring: heart rate, continuous pulse ox, blood pressure and cardiac monitor Approach: midline Location: L3-4 Injection technique: single-shot Needle Needle type: Whitacre and Introducer  Needle gauge: 24 G Needle length: 9 cm Assessment Sensory level: T10 Additional Notes Negative paresthesia. Negative blood return. Positive free-flowing CSF. Expiration date of kit checked and confirmed. Patient tolerated procedure well, without complications.

## 2018-02-10 NOTE — Discharge Summary (Signed)
OB Discharge Summary     Patient Name: Denise Walker DOB: 08-28-1990 MRN: [id number]  Date of admission: 02/10/2018 Delivering MD: Prentice Docker, MD  Date of Delivery: 02/10/2018  Date of discharge: 02/12/2018  Admitting diagnosis: history of cesarean delivery, desires repeat Intrauterine pregnancy: [redacted]w[redacted]d    Secondary diagnosis: Gestational Diabetes medication controlled (A2) - uncontrolled diabetes     Discharge diagnosis: Term Pregnancy Delivered and GDM A2                                                                                                Post partum procedures:rhogam  Augmentation: n/a  Complications: None  Hospital course:  Sceduled C/S   27y.o. yo G5P1031 at 372w2das admitted to the hospital 02/10/2018 for scheduled cesarean section with the following indication:Elective Repeat and uncontrolled gestational diabetes on insulin.  Membrane Rupture Time/Date: 12:20 PM ,02/10/2018   Patient delivered a Viable infant.02/10/2018  Details of operation can be found in separate operative note.  Pateint had an uncomplicated postpartum course.  She is ambulating, tolerating a regular diet, passing flatus, and urinating well. Patient is discharged home in stable condition on  02/12/18         Physical exam  Vitals:   02/11/18 1557 02/11/18 2053 02/12/18 0055 02/12/18 0923  BP: 102/61 98/70 107/81 (!) 106/58  Pulse: 100 91 90 83  Resp: 20 20 20 18   Temp: 98.9 F (37.2 C) 98 F (36.7 C) 97.8 F (36.6 C) 98.2 F (36.8 C)  TempSrc: Oral Oral Oral Oral  SpO2: 98% 100%  99%  Weight:      Height:       Labs:General: WF in NAD Heart: RRR with Grade 1188/TGystolic murmur Pulmonary: no increased work of breathing/ CTAB Abdomen: non-distended, non-tender, bowel sounds active x 4 Incision: Honey comb dressing C&D&I; ON Q pump dressing saturated with serosanguinous drainage. Dressing changed and more Dermabond applied to base of catheters. Extremities: no edema, no  erythema, no tenderness  Lab Results  Component Value Date   WBC 12.1 (H) 02/11/2018   HGB 9.8 (L) 02/11/2018   HCT 30.3 (L) 02/11/2018   MCV 94.7 02/11/2018   PLT 162 02/11/2018    Discharge instruction: per After Visit Summary.  Medications:  Allergies as of 02/12/2018      Reactions   Fish Allergy Shortness Of Breath      Medication List    STOP taking these medications   ACCU-CHEK FASTCLIX LANCETS Misc   glucose blood test strip Commonly known as:  ACCU-CHEK GUIDE   insulin aspart 100 UNIT/ML FlexPen Commonly known as:  NOVOLOG   insulin detemir 100 UNIT/ML injection Commonly known as:  LEVEMIR   Insulin Pen Needle 32G X 6 MM Misc Commonly known as:  NOVOFINE   Insulin Syringe-Needle U-100 29G 0.5 ML Misc     TAKE these medications   docusate sodium 100 MG capsule Commonly known as:  COLACE Take 1 capsule (100 mg total) by mouth daily as needed.   ferrous sulfate 325 (65 FE) MG tablet Commonly known as:  FERROUSUL Take 1 tablet (  325 mg total) by mouth daily.   fluticasone 50 MCG/ACT nasal spray Commonly known as:  FLONASE USE 2 SPRAYS INTO EACH NOSTRIL TWICE A DAY What changed:  Another medication with the same name was removed. Continue taking this medication, and follow the directions you see here.   ibuprofen 600 MG tablet Commonly known as:  ADVIL,MOTRIN Take 1 tablet (600 mg total) by mouth every 6 (six) hours.   oxyCODONE-acetaminophen 5-325 MG tablet Commonly known as:  PERCOCET/ROXICET Take 1-2 tablets by mouth every 6 (six) hours as needed for up to 7 days for moderate pain or severe pain.   PRENATAL 1 PO Take 1 tablet by mouth daily.       Diet: routine diet  Activity: Advance as tolerated. Pelvic rest for 6 weeks.   Outpatient follow up: Follow-up Information    Will Bonnet, MD. Schedule an appointment as soon as possible for a visit on 02/20/2018.   Specialty:  Obstetrics and Gynecology Why:  Postpartum follow up  appointment on Friday January 10th at 9:30 AM with Dr. Glennon Mac for an incision check Contact information: [place] [address] [phone]             Postpartum contraception: None Rhogam Given postpartum: yes Rubella vaccine given postpartum: yes, MMR Varicella vaccine given postpartum: no TDaP given antepartum or postpartum: 02/02/2018 Influenza vaccine given: 10/23/2017  Newborn Data: Live born female Adalyn Birth Weight: 6 lb 8 oz (2948 g) APGAR: 8, 9  Newborn Delivery   Birth date/time:  02/10/2018 12:20:00 Delivery type:  C-Section, Low Transverse Trial of labor:  No C-section categorization:  Repeat      Baby Feeding: Bottle and Breast  Disposition:home with mother  SIGNED: Dalia Heading, CNM

## 2018-02-10 NOTE — H&P (Signed)
OB History & Physical   History of Present Illness:  Chief Complaint: here for cesarean section  HPI:  Denise Walker is a 27 y.o. [phone] female at 382w2d dated by LMP consistent with 6 week ultrasound.  Her pregnancy has been complicated by Rh negative, GDMA2 uncontrolled, history of cesarean section, history of eclampsia in a prior pregnancy, anxiety.  In the past week or so her blood glucose level has been difficult to control, requiring multiple adjustments to her insulin regimen.  See recent notes and patient messages for details.  She denies contractions.   She denies leakage of fluid.   She denies vaginal bleeding.   She reports fetal movement.    Maternal Medical History:   Past Medical History:  Diagnosis Date  . Gestational diabetes   . PCOS (polycystic ovarian syndrome)     Past Surgical History:  Procedure Laterality Date  . CESAREAN SECTION N/A 07/15/2014   Procedure: CESAREAN SECTION;  Surgeon: Nadara Mustardobert P Harris, MD;  Location: [facility] ORS;  Service: Obstetrics;  Laterality: N/A;  . HERNIA REPAIR  10/2013  . WISDOM TOOTH EXTRACTION      Allergies  Allergen Reactions  . Fish Allergy Shortness Of Breath    Prior to Admission medications   Medication Sig Start Date End Date Taking? Authorizing Provider  ACCU-CHEK FASTCLIX LANCETS MISC 1 Units by Percutaneous route 4 (four) times daily. 09/08/17  Yes Vena AustriaStaebler, Andreas, MD  ferrous sulfate (FERROUSUL) 325 (65 FE) MG tablet Take 1 tablet (325 mg total) by mouth daily. 12/09/17  Yes Vena AustriaStaebler, Andreas, MD  fluticasone (FLONASE) 50 MCG/ACT nasal spray USE 2 SPRAYS INTO EACH NOSTRIL TWICE A DAY 01/07/18  Yes [provider]  glucose blood (ACCU-CHEK GUIDE) test strip Use as instructed 09/19/17  Yes Vena AustriaStaebler, Andreas, MD  insulin aspart (NOVOLOG) 100 UNIT/ML FlexPen Inject 2 Units into the skin 3 (three) times daily with meals. Patient taking differently: Inject 8 Units into the skin 3 (three) times daily with meals.  10/16/17   Yes Vena AustriaStaebler, Andreas, MD  insulin detemir (LEVEMIR) 100 UNIT/ML injection Inject 0.42 mLs (42 Units total) into the skin at bedtime. 02/07/18  Yes Conard NovakJackson,  D, MD  Insulin Pen Needle (NOVOFINE) 32G X 6 MM MISC Use as directed 10/16/17  Yes Vena AustriaStaebler, Andreas, MD  Insulin Syringe-Needle U-100 29G 0.5 ML MISC Use as directed 10/16/17  Yes Vena AustriaStaebler, Andreas, MD  Prenatal MV-Min-Fe Fum-FA-DHA (PRENATAL 1 PO) Take 1 tablet by mouth daily.   Yes [provider]  aspirin EC 81 MG tablet Take 81 mg by mouth daily.    [provider]  escitalopram (LEXAPRO) 10 MG tablet Take 1 tablet (10 mg total) by mouth daily. 08/13/16 09/12/17  Vena AustriaStaebler, Andreas, MD  fluticasone (FLONASE) 50 MCG/ACT nasal spray 2 sprays by Each Nare route Two (2) times a day. 06/05/16 10/15/17  [provider]    OB History  Gravida Para Term Preterm AB Living  5 1 1   3 1   SAB TAB Ectopic Multiple Live Births  3     0 1    # Outcome Date GA Lbr Len/2nd Weight Sex Delivery Anes PTL Lv  5 Current           4 SAB 07/22/16          3 Term 07/15/14 4464w0d  2780 g F CS-LVertical EPI  LIV  2 SAB 2015          1 SAB 2013  Prenatal care site: [facility]  Social History: She  reports that she has never smoked. She has never used smokeless tobacco. She reports that she does not drink alcohol or use drugs.  Family History: family history includes Cancer in her mother; Diabetes in her paternal aunt and paternal grandfather.   Review of Systems:  Review of Systems  Constitutional: Negative.   HENT: Negative.   Eyes: Negative.   Respiratory: Negative.   Cardiovascular: Negative.   Gastrointestinal: Negative.   Genitourinary: Negative.   Musculoskeletal: Negative.   Skin: Negative.   Neurological: Negative.   Psychiatric/Behavioral: Negative.      Physical Exam:  Vital Signs: BP 102/70 (BP Location: Left Arm)   Pulse 97   Temp 98.7 F (37.1 C) (Oral)   Resp 18   Ht 4\' 11"  (1.499 m)    Wt 83.9 kg   LMP 05/25/2017 (Exact Date)   BMI 37.37 kg/m  Physical Exam Constitutional:      General: She is not in acute distress.    Appearance: Normal appearance.  HENT:     Head: Normocephalic and atraumatic.  Eyes:     General: No scleral icterus.    Conjunctiva/sclera: Conjunctivae normal.  Cardiovascular:     Rate and Rhythm: Normal rate and regular rhythm.     Heart sounds: No murmur. No friction rub. No gallop.   Pulmonary:     Effort: Pulmonary effort is normal.     Breath sounds: Normal breath sounds. No wheezing, rhonchi or rales.  Abdominal:     Comments: Gravid, NT. No tenderness over prior skin incision and uterine scar  Musculoskeletal: Normal range of motion.        General: No swelling.  Skin:    General: Skin is warm and dry.  Neurological:     Mental Status: She is alert.  Psychiatric:        Mood and Affect: Mood normal.        Behavior: Behavior normal.        Thought Content: Thought content normal.        Judgment: Judgment normal.       Pertinent Results:  Prenatal Labs: Blood type/Rh O negative  Antibody screen negative  Rubella equivocal  Varicella Immune    RPR NR  HBsAg negative  HIV negative  GC negative  Chlamydia negative  Genetic screening NIPT diploid XX, CF, SMA, Fragile X negative. No msAFP  1 hour GTT Failed - GDM  3 hour GTT n/a  GBS negative on 02/05/2018   Baseline FHR: 130 beats/min   Variability: moderate   Accelerations: present   Decelerations: absent Contractions: present frequency: infrequent Overall assessment: category 1  Assessment:  Denise LandauKelley Valent is a 27 y.o. 395P1031 female at 3450w2d here for repeat cesarean delivery due to uncontrolled gestational diabetes requiring insulin.   Plan:  1. Admit to Labor & Delivery  2. CBC, T&S, NPO, IVF 3. GBS negative.   4. Fetwal well-being: reassuring 5. To OR for cesarean delivery.  The patient has been counseled by me and I have personally consented her for the  procedure.   Thomasene MohairStephen , MD 02/10/2018 11:16 AM

## 2018-02-10 NOTE — Op Note (Signed)
Cesarean Section Operative Note    Denise Walker   02/10/2018   Pre-operative Diagnosis:  1) History of cesarean delivery, desires repeat 2) Gestational diabetes, requiring insulin - uncontrolled 3) intrauterine pregnancy at 3634w2d   Post-operative Diagnosis:  1) History of cesarean delivery, desires repeat 2) Gestational diabetes, requiring insulin - uncontrolled 3) intrauterine pregnancy at 4534w2d    Procedure: Repeat low transverse cesarean section via pfannenstiel incision with double-layer uterine closure  Surgeon: Surgeon(s) and Role:    Conard Novak* Jackson, Stephen D, MD - Primary     Assistants: Dr. Adelene Idlerhristanna Schuman; No other capable assistant available, in surgery requiring high level assistant.  Anesthesia: spinal   Findings:  1) normal appearing gravid uterus, fallopian tubes, and ovaries 2) viable female infant with weight 2,948 grams with APGARs 8 and 9   Estimated Blood Loss: 900 mL  Total IV Fluids: 900 ml crystalloid  Urine Output: 100 mL clear urine at end of case  Specimens: none  Complications: no complications  Disposition: PACU - hemodynamically stable.   Maternal Condition: stable   Baby condition / location:  Couplet care / Skin to Skin  Procedure Details:  The patient was seen in the Holding Room. The risks, benefits, complications, treatment options, and expected outcomes were discussed with the patient. The patient concurred with the proposed plan, giving informed consent. identified as Denise Walker and the procedure verified as C-Section Delivery. A Time Out was held and the above information confirmed.   After induction of anesthesia, the patient was draped and prepped in the usual sterile manner. A Pfannenstiel incision was made and carried down through the subcutaneous tissue to the fascia. Fascial incision was made and extended transversely. The fascia was separated from the underlying rectus tissue superiorly and inferiorly. The peritoneum was  identified and entered. Peritoneal incision was extended longitudinally. The bladder flap was bluntly and sharply freed from the lower uterine segment. A low transverse uterine incision was made and the hysterotomy was extended with cranial-caudal tension. Delivered from cephalic presentation was a 2,948 gram Living newborn infant(s) or Female with Apgar scores of 8 at one minute and 9 at five minutes. Cord ph was not sent the umbilical cord was clamped and cut cord blood was obtained for evaluation. The placenta was removed Intact and appeared normal. The uterine outline, tubes and ovaries appeared normal, though there were mild adhesions of the fallopian tubes to the bilateral ovaries. The uterine incision was closed with running locked sutures of 0 Vicryl.  A second layer of the same suture was thrown in an imbricating fashion.  Hemostasis was assured.  The uterus was returned to the abdomen and the paracolic gutters were cleared of all clots and debris.  The rectus muscles were inspected and found to be hemostatic.  The On-Q catheter pumps were inserted in accordance with the manufacturer's recommendations.  The catheters were inserted approximately 4cm cephelad to the incision line, approximately 1cm apart, straddling the midline.  They were inserted to a depth of the 4th mark. They were positioned superficial to the rectus abdominus muscles and deep to the rectus fascia.    The fascia was then reapproximated with running sutures of 1-0 PDS, looped. The subcutaneous tissue was reapproximated using 2-0 plain gut such that no greater than 2cm of dead space remained. The subcuticular closure was performed using 4-0 monocryl. The skin closure was reinforced using surgical skin glue (Dermabond).  The On-Q catheters were bolused with 5 mL of 0.5% marcaine plain for a  total of 10 mL.  The catheters were affixed to the skin with surgical skin glue, steri-strips, and tegaderm.    Instrument, sponge, and needle  counts were correct prior the abdominal closure and were correct at the conclusion of the case.  The patient received Ancef 2 gram IV prior to skin incision (within 30 minutes). For VTE prophylaxis she was wearing SCDs throughout the case.  The assistant surgeon was an MD due to lack of availability of another Sales promotion account executivequalified assistant.   Signed: Conard NovakStephen D. Jackson, MD 02/10/2018 1:04 PM

## 2018-02-10 NOTE — OB Triage Note (Signed)
[name] here for scheduled c/s

## 2018-02-10 NOTE — Consult Note (Signed)
Neonatology Note:   Attendance at C-section:    I was asked by Dr. Jackson to attend this repeat C/S at term. The mother is a G5P1031 at 37 2/7weeks, GBS neg with good prenatal care complicated by PCOS and GDM on insulin. ROM 0 hours before delivery, fluid clear. Infant vigorous with good spontaneous cry and tone. +60 sec DCC.  Needed only minimal bulb suctioning. Ap 8/9. Lungs clear to ausc in DR. To CN to care of Pediatrician.  Denise Walker C. Denise Milner, MD Neonatologist 02/10/2018, 1:18 PM   

## 2018-02-10 NOTE — Transfer of Care (Signed)
Immediate Anesthesia Transfer of Care Note  Patient: Denise LandauKelley Walker  Procedure(s) Performed: CESAREAN SECTION (N/A )  Patient Location: PACU and Mother/Baby  Anesthesia Type:Spinal  Level of Consciousness: awake, alert  and oriented  Airway & Oxygen Therapy: Patient Spontanous Breathing  Post-op Assessment: Report given to RN and Post -op Vital signs reviewed and stable  Post vital signs: Reviewed and stable  Last Vitals:  Vitals Value Taken Time  BP 102/46 02/10/2018  1:14 PM  Temp    Pulse 70 02/10/2018  1:14 PM  Resp 20 02/10/2018  1:14 PM  SpO2 98 % 02/10/2018  1:14 PM    Last Pain:  Vitals:   02/10/18 1001  TempSrc:   PainSc: 0-No pain         Complications: No apparent anesthesia complications

## 2018-02-10 NOTE — Lactation Note (Signed)
This note was copied from a baby's chart. Lactation Consultation Note  Patient Name: Denise Walker ZOXWR'UToday's Date: 02/10/2018 Reason for consult: Follow-up assessment   Maternal Data  mom has large, soft breasts, baby does not open mouth wide, able to latch with compression and shaping at baby's mouth with pressure on chin, mom does not want to use nipple shield , used with last child and concerned that it decreased her milk supply, prefers to pump breasts if baby doesn't latch well  Feeding Feeding Type: Breast Fed Not opening mouth well, gaggy and burping up mucous, tight jaw, once able to get breast deep in mouth, had  Several swallows     LATCH Score Latch: Repeated attempts needed to sustain latch, nipple held in mouth throughout feeding, stimulation needed to elicit sucking reflex.  Audible Swallowing: A few with stimulation  Type of Nipple: Everted at rest and after stimulation  Comfort (Breast/Nipple): Soft / non-tender  Hold (Positioning): Assistance needed to correctly position infant at breast and maintain latch.  LATCH Score: 7  Interventions Interventions: Assisted with latch;Hand express;Breast compression;Adjust position;Support pillows;Position options  Lactation Tools Discussed/Used   Symphony breast pump set up for pt to pump breasts if baby will not latch or has poor feed to provide supplement for baby  Consult Status Consult Status: Follow-up Date: 02/11/18 Follow-up type: In-patient    Dyann KiefMarsha D Traci Gafford 02/10/2018, 8:26 PM

## 2018-02-10 NOTE — Anesthesia Procedure Notes (Signed)
Spinal  End time: 02/10/2018 11:54 AM Staffing Anesthesiologist: Lenard SimmerKarenz, Andrew, MD Resident/CRNA: Omer JackWeatherly, Ardene Remley, CRNA Performed: resident/CRNA  Preanesthetic Checklist Completed: patient identified, site marked, surgical consent, pre-op evaluation, timeout performed, IV checked, risks and benefits discussed and monitors and equipment checked Spinal Block Patient position: sitting Prep: Betadine and site prepped and draped Patient monitoring: heart rate, continuous pulse ox and blood pressure Approach: midline Location: L3-4 Injection technique: single-shot Needle Needle type: Whitacre  Needle gauge: 22 G Needle length: 9 cm Assessment Sensory level: T6

## 2018-02-10 NOTE — Anesthesia Post-op Follow-up Note (Signed)
Anesthesia QCDR form completed.        

## 2018-02-10 NOTE — Progress Notes (Signed)
RN called MD about continuing CBG checks for pt. Per MD, continue check through the night.

## 2018-02-10 NOTE — Anesthesia Preprocedure Evaluation (Signed)
Anesthesia Evaluation  Patient identified by MRN, date of birth, ID band Patient awake    Reviewed: Allergy & Precautions, NPO status , Patient's Chart, lab work & pertinent test results  History of Anesthesia Complications Negative for: history of anesthetic complications  Airway Mallampati: III  TM Distance: >3 FB Neck ROM: Full    Dental  (+) Teeth Intact   Pulmonary asthma ,           Cardiovascular Exercise Tolerance: Good negative cardio ROS       Neuro/Psych  Headaches, Seizures -, Well Controlled,  Anxiety Depression    GI/Hepatic Neg liver ROS, GERD  ,  Endo/Other  diabetes  Renal/GU negative Renal ROS  negative genitourinary   Musculoskeletal negative musculoskeletal ROS (+)   Abdominal   Peds  Hematology negative hematology ROS (+)   Anesthesia Other Findings Past Medical History: No date: Gestational diabetes No date: PCOS (polycystic ovarian syndrome)   Reproductive/Obstetrics (+) Pregnancy                             Anesthesia Physical  Anesthesia Plan  ASA: II  Anesthesia Plan: Spinal   Post-op Pain Management:    Induction:   PONV Risk Score and Plan:   Airway Management Planned: Nasal Cannula and Natural Airway  Additional Equipment:   Intra-op Plan:   Post-operative Plan:   Informed Consent: I have reviewed the patients History and Physical, chart, labs and discussed the procedure including the risks, benefits and alternatives for the proposed anesthesia with the patient or authorized representative who has indicated his/her understanding and acceptance.     Plan Discussed with:   Anesthesia Plan Comments:         Anesthesia Quick Evaluation

## 2018-02-11 LAB — TYPE AND SCREEN
ABO/RH(D): O NEG
Antibody Screen: POSITIVE
Unit division: 0
Unit division: 0

## 2018-02-11 LAB — CBC
HCT: 30.3 % — ABNORMAL LOW (ref 36.0–46.0)
Hemoglobin: 9.8 g/dL — ABNORMAL LOW (ref 12.0–15.0)
MCH: 30.6 pg (ref 26.0–34.0)
MCHC: 32.3 g/dL (ref 30.0–36.0)
MCV: 94.7 fL (ref 80.0–100.0)
Platelets: 162 10*3/uL (ref 150–400)
RBC: 3.2 MIL/uL — ABNORMAL LOW (ref 3.87–5.11)
RDW: 14.9 % (ref 11.5–15.5)
WBC: 12.1 10*3/uL — ABNORMAL HIGH (ref 4.0–10.5)
nRBC: 0 % (ref 0.0–0.2)

## 2018-02-11 LAB — GLUCOSE, CAPILLARY
GLUCOSE-CAPILLARY: 114 mg/dL — AB (ref 70–99)
Glucose-Capillary: 83 mg/dL (ref 70–99)

## 2018-02-11 LAB — BPAM RBC
Blood Product Expiration Date: 202001282359
Blood Product Expiration Date: 202001302359
Unit Type and Rh: 9500
Unit Type and Rh: 9500

## 2018-02-11 LAB — RPR: RPR Ser Ql: NONREACTIVE

## 2018-02-11 LAB — FETAL SCREEN: Fetal Screen: NEGATIVE

## 2018-02-11 MED ORDER — RHO D IMMUNE GLOBULIN 1500 UNIT/2ML IJ SOSY
300.0000 ug | PREFILLED_SYRINGE | Freq: Once | INTRAMUSCULAR | Status: AC
  Administered 2018-02-11: 300 ug via INTRAMUSCULAR
  Filled 2018-02-11: qty 2

## 2018-02-11 MED ORDER — OXYCODONE-ACETAMINOPHEN 5-325 MG PO TABS
1.0000 | ORAL_TABLET | Freq: Four times a day (QID) | ORAL | Status: DC | PRN
  Administered 2018-02-11 (×2): 1 via ORAL
  Administered 2018-02-11 – 2018-02-12 (×3): 2 via ORAL
  Filled 2018-02-11: qty 1
  Filled 2018-02-11 (×2): qty 2
  Filled 2018-02-11: qty 1
  Filled 2018-02-11: qty 2

## 2018-02-11 MED ORDER — IBUPROFEN 600 MG PO TABS
600.0000 mg | ORAL_TABLET | Freq: Four times a day (QID) | ORAL | Status: DC
  Administered 2018-02-11 – 2018-02-12 (×5): 600 mg via ORAL
  Filled 2018-02-11 (×5): qty 1

## 2018-02-11 NOTE — Progress Notes (Signed)
POD#1 rLTCS Subjective:  Sitting up in bed, working with lactation on feeding baby. Reports that she has ambulated and voided without difficulty. Tolerating a regular diet. Pain control is adequate. She is having itching and asking for medication to relieve it.  Objective:  Blood pressure 108/70 , pulse 89, temperature 98.5 F (36.9 C), temperature source Oral, resp. rate 18, height 4' 11"  (1.499 m), weight 83.9 kg, last menstrual period 05/25/2017, SpO2 98%.  General: NAD Pulmonary: no increased work of breathing Abdomen: non-distended, non-tender, fundus firm, lochia appropriate Incision: Dressing D/I Extremities: no edema, no erythema, no tenderness  Results for orders placed or performed during the hospital encounter of 02/10/18 (from the past 72 hour(s))  CBC     Status: Abnormal   Collection Time: 02/10/18 10:00 AM  Result Value Ref Range   WBC 12.3 (H) 4.0 - 10.5 K/uL   RBC 3.82 (L) 3.87 - 5.11 MIL/uL   Hemoglobin 11.7 (L) 12.0 - 15.0 g/dL   HCT 35.3 (L) 36.0 - 46.0 %   MCV 92.4 80.0 - 100.0 fL   MCH 30.6 26.0 - 34.0 pg   MCHC 33.1 30.0 - 36.0 g/dL   RDW 15.1 11.5 - 15.5 %   Platelets 184 150 - 400 K/uL   nRBC 0.0 0.0 - 0.2 %    Comment: Performed at [facility], [place], [place] [address]  Rapid HIV screen (HIV 1/2 Ab+Ag)     Status: None   Collection Time: 02/10/18 10:00 AM  Result Value Ref Range   HIV-1 P24 Antigen - HIV24 NON REACTIVE NON REACTIVE   HIV 1/2 Antibodies NON REACTIVE NON REACTIVE   Interpretation (HIV Ag Ab)      A non reactive test result means that HIV 1 or HIV 2 antibodies and HIV 1 p24 antigen were not detected in the specimen.    Comment: Performed at [facility], [address] [place], [place] [address]  RPR     Status: None   Collection Time: 02/10/18 10:00 AM  Result Value Ref Range   RPR Ser Ql Non Reactive Non Reactive    Comment: (NOTE) Performed At: [facility] [address]  [place], [place] [id number] Rush Farmer MD GL:[id number]   Glucose, capillary     Status: Abnormal   Collection Time: 02/10/18 10:41 AM  Result Value Ref Range   Glucose-Capillary 63 (L) 70 - 99 mg/dL  Type and screen     Status: None   Collection Time: 02/10/18 10:44 AM  Result Value Ref Range   ABO/RH(D) O NEG    Antibody Screen POS    Sample Expiration 02/13/2018    Antibody Identification PASSIVELY ACQUIRED ANTI-D    Unit Number [id number]    Blood Component Type RED CELLS,LR    Unit division 00    Status of Unit REL FROM [facility]    Transfusion Status OK TO TRANSFUSE    Crossmatch Result      COMPATIBLE Performed at [facility], [address], [place], [place] [address]    Unit Number [id number]    Blood Component Type RBC LR PHER1    Unit division 00    Status of Unit REL FROM [facility]    Transfusion Status OK TO TRANSFUSE    Crossmatch Result COMPATIBLE   Glucose, capillary     Status: None   Collection Time: 02/10/18  2:08 PM  Result Value Ref Range   Glucose-Capillary 98 70 - 99 mg/dL  Glucose, capillary     Status: Abnormal   Collection Time: 02/10/18 11:21 PM  Result Value Ref Range   Glucose-Capillary 124 (H) 70 - 99 mg/dL  Glucose, capillary     Status: Abnormal   Collection Time: 02/11/18  2:19 AM  Result Value Ref Range   Glucose-Capillary 114 (H) 70 - 99 mg/dL  CBC     Status: Abnormal   Collection Time: 02/11/18  3:52 AM  Result Value Ref Range   WBC 12.1 (H) 4.0 - 10.5 K/uL   RBC 3.20 (L) 3.87 - 5.11 MIL/uL   Hemoglobin 9.8 (L) 12.0 - 15.0 g/dL   HCT 30.3 (L) 36.0 - 46.0 %   MCV 94.7 80.0 - 100.0 fL   MCH 30.6 26.0 - 34.0 pg   MCHC 32.3 30.0 - 36.0 g/dL   RDW 14.9 11.5 - 15.5 %   Platelets 162 150 - 400 K/uL   nRBC 0.0 0.0 - 0.2 %    Comment: Performed at [facility] [address], [place], [place] [address]  Fetal screen     Status: None   Collection Time: 02/11/18  3:52 AM  Result Value Ref Range   Fetal Screen       NEG Performed at [facility], [address], [place], [place] [address]   Rhogam injection     Status: None (Preliminary result)   Collection Time: 02/11/18  3:52 AM  Result Value Ref Range   Unit Number [id number]    Blood Component Type RHIG    Unit division 00    Status of Unit ALLOCATED    Transfusion Status OK TO TRANSFUSE   Glucose, capillary     Status: None   Collection Time: 02/11/18  7:50 AM  Result Value Ref Range   Glucose-Capillary 83 70 - 99 mg/dL     Assessment:   28 y.o. G5P1031 postoperative day # 1 recovering well.   Plan:  1) Acute blood loss anemia - hemodynamically stable and asymptomatic - PO ferrous sulfate  2) Blood Type --/--/O NEG (12/31 1044) / Rubella 0.97 (06/11 1453) equivoval / Varicella Immune Information for the patient's newborn:  Gregory, Dowe [[id number]]  O POS RhoGAM ordered MMR ordered  3) TDAP status: received 02/02/2018  4) BG monitoring has been normal post-delivery, discontinue checking BG.  5) Breastfeeding /Contraception: not discussed  6) Disposition: continue postpartum care.  Avel Sensor, CNM 02/11/2018

## 2018-02-11 NOTE — Lactation Note (Addendum)
This note was copied from a baby's chart. Lactation Consultation Note  Patient Name: Denise Walker [id number] Date: 02/11/2018 Reason for consult: Initial assessment   Maternal Data    Feeding Feeding Type: Breast Milk(Attempted to breastfeed. Infant not maintaining latch)  LATCH Score Latch: Too sleepy or reluctant, no latch achieved, no sucking elicited.  Audible Swallowing: None  Type of Nipple: Everted at rest and after stimulation  Comfort (Breast/Nipple): Filling, red/small blisters or bruises, mild/mod discomfort  Hold (Positioning): Full assist, staff holds infant at breast  LATCH Score: 3  Interventions Interventions: Breast feeding basics reviewed;Assisted with latch;Adjust position;Support pillows;Position options;Coconut oil;Expressed milk  Lactation Tools Discussed/Used Tools: Nipple Shields Nipple shield size: 20   Consult Status Consult Status: Follow-up Date: 02/11/18 Follow-up type: In-patient LC assisted with latch and positioning. Infant could not open mouth wide to achieve a deep latch and after several attempts, a nipple shield was used. Infant would not suck much using the shield unless stimulated and would then suck a couple of times and slide off the nipple and could not maintain latch. Mother states that she was able to pump 20 mL yesterday and just pumped 4 ML that LC syringe fed to infant. Mother is worried that infant is not getting enough to eat. LC explained to mother about infant's stomach size and how to know if infant is getting enough to eat by frequency of wet and dirty diapers and hunger cues.   When LC returned to check on mother and infant, mother is adamant about having a bottle of formula in the room and states, "I just want to make sure that she is getting what she needs because I am not pumping out as much breastmilk now." Mother is concerned because infant spit up the last couple times she was given the pumped breast milk. LC  explained to mother that this could be a sign that infant is full and that is is normal to pump out different amounts at different times of the day. LC provided mother with a bottle of formula and told her to give no more than 10-15 mL.   Arlyss Gandy 02/11/2018, 12:12 PM

## 2018-02-12 ENCOUNTER — Encounter: Payer: BLUE CROSS/BLUE SHIELD | Admitting: Obstetrics and Gynecology

## 2018-02-12 ENCOUNTER — Ambulatory Visit: Payer: BLUE CROSS/BLUE SHIELD

## 2018-02-12 ENCOUNTER — Encounter: Payer: Self-pay | Admitting: Certified Nurse Midwife

## 2018-02-12 LAB — RHOGAM INJECTION: Unit division: 0

## 2018-02-12 MED ORDER — DOCUSATE SODIUM 100 MG PO CAPS: 100.0000 mg | ORAL_CAPSULE | Freq: Every day | ORAL | 2 refills | Status: DC | PRN

## 2018-02-12 MED ORDER — FLUTICASONE PROPIONATE 50 MCG/ACT NA SUSP: NASAL | 11 refills | Status: DC

## 2018-02-12 MED ORDER — OXYCODONE-ACETAMINOPHEN 5-325 MG PO TABS: 1.0000 | ORAL_TABLET | Freq: Four times a day (QID) | ORAL | 0 refills | Status: AC | PRN

## 2018-02-12 MED ORDER — IBUPROFEN 600 MG PO TABS: 600.0000 mg | ORAL_TABLET | Freq: Four times a day (QID) | ORAL | 1 refills | Status: DC

## 2018-02-12 MED ORDER — MEASLES, MUMPS & RUBELLA VAC IJ SOLR
0.5000 mL | Freq: Once | INTRAMUSCULAR | Status: AC
  Administered 2018-02-12: 0.5 mL via SUBCUTANEOUS
  Filled 2018-02-12: qty 0.5

## 2018-02-12 NOTE — Discharge Instructions (Signed)
Please call your doctor or return to the ER if you experience any chest pains, shortness of breath, dizziness, visual changes, fever greater than 101, any heavy bleeding (saturating more than 1 pad per hour), large clots, or foul smelling discharge, any worsening abdominal pain and cramping that is not controlled by pain medication, or any signs of postpartum depression. No tampons, enemas, douches, or sexual intercourse for 6 weeks. Also avoid tub baths, hot tubs, or swimming for 6 weeks.  ° ° °Check your incision daily for any signs of infection such as redness, warmth, swelling, increased pain, or pus/foul smelling drainage ° ° °Activity: do not lift over 10 lbs for 6 weeks  °No driving for 2 weeks  °Pelvic rest for 6 weeks  °

## 2018-02-12 NOTE — Progress Notes (Signed)
Discharge order received from doctor. MMR vaccine given prior to discharge. Reviewed discharge instructions and prescriptions with patient and answered all questions. On-Q pump removal instructions given. Incision cleaning kit given. Follow up appointment given. Patient verbalized understanding. ID bands checked. Patient discharged home with infant via wheelchair by nursing/auxillary.     Hilbert Bible, RN

## 2018-02-12 NOTE — Progress Notes (Signed)
POD #2 repeat CS/ GDM uncontrolled on insulin Subjective:   Doing well. Tolerating regular diet. Passing flatus. Voiding qs. Some urgency incontinence. Bleeding slowing. Breast feeding and supplementing (nipples sore from breast feeding frequently). Received Rhogam. Desires discharge  Objective:  Blood pressure (!) 106/58, pulse 83, temperature 98.2 F (36.8 C), temperature source Oral, resp. rate 18, height 4' 11"  (1.499 m), weight 83.9 kg, last menstrual period 05/25/2017, SpO2 99 %, unknown if currently breastfeeding.  General: WF in NAD Heart: RRR with Grade 33/ID systolic murmur Pulmonary: no increased work of breathing/ CTAB Abdomen: non-distended, non-tender, bowel sounds active x 4 Incision: Honey comb dressing C&D&I; ON Q pump dressing saturated with serosanguinous drainage. Dressing changed and more Dermabond applied to base of catheters. Extremities: no edema, no erythema, no tenderness  Results for orders placed or performed during the hospital encounter of 02/10/18 (from the past 72 hour(s))  CBC     Status: Abnormal   Collection Time: 02/10/18 10:00 AM  Result Value Ref Range   WBC 12.3 (H) 4.0 - 10.5 K/uL   RBC 3.82 (L) 3.87 - 5.11 MIL/uL   Hemoglobin 11.7 (L) 12.0 - 15.0 g/dL   HCT 35.3 (L) 36.0 - 46.0 %   MCV 92.4 80.0 - 100.0 fL   MCH 30.6 26.0 - 34.0 pg   MCHC 33.1 30.0 - 36.0 g/dL   RDW 15.1 11.5 - 15.5 %   Platelets 184 150 - 400 K/uL   nRBC 0.0 0.0 - 0.2 %    Comment: Performed at [facility], [address] [place], [place] [address]  Rapid HIV screen (HIV 1/2 Ab+Ag)     Status: None   Collection Time: 02/10/18 10:00 AM  Result Value Ref Range   HIV-1 P24 Antigen - HIV24 NON REACTIVE NON REACTIVE   HIV 1/2 Antibodies NON REACTIVE NON REACTIVE   Interpretation (HIV Ag Ab)      A non reactive test result means that HIV 1 or HIV 2 antibodies and HIV 1 p24 antigen were not detected in the specimen.    Comment: Performed at [facility],  [address] [place], [place] [address]  RPR     Status: None   Collection Time: 02/10/18 10:00 AM  Result Value Ref Range   RPR Ser Ql Non Reactive Non Reactive    Comment: (NOTE) Performed At: [facility] [address] [place], [place] [id number] Rush Farmer MD [id number]   Glucose, capillary     Status: Abnormal   Collection Time: 02/10/18 10:41 AM  Result Value Ref Range   Glucose-Capillary 63 (L) 70 - 99 mg/dL  Type and screen     Status: None   Collection Time: 02/10/18 10:44 AM  Result Value Ref Range   ABO/RH(D) O NEG    Antibody Screen POS    Sample Expiration 02/13/2018    Antibody Identification PASSIVELY ACQUIRED ANTI-D    Unit Number [id number]    Blood Component Type RED CELLS,LR    Unit division 00    Status of Unit REL FROM [facility]    Transfusion Status OK TO TRANSFUSE    Crossmatch Result      COMPATIBLE Performed at [facility], [address] [place], [place] [address]    Unit Number [id number]    Blood Component Type RBC LR PHER1    Unit division 00    Status of Unit REL FROM [facility]    Transfusion Status OK TO TRANSFUSE    Crossmatch Result  COMPATIBLE   Glucose, capillary     Status: None   Collection Time: 02/10/18  2:08 PM  Result Value Ref Range   Glucose-Capillary 98 70 - 99 mg/dL  Glucose, capillary     Status: Abnormal   Collection Time: 02/10/18 11:21 PM  Result Value Ref Range   Glucose-Capillary 124 (H) 70 - 99 mg/dL  Glucose, capillary     Status: Abnormal   Collection Time: 02/11/18  2:19 AM  Result Value Ref Range   Glucose-Capillary 114 (H) 70 - 99 mg/dL  CBC     Status: Abnormal   Collection Time: 02/11/18  3:52 AM  Result Value Ref Range   WBC 12.1 (H) 4.0 - 10.5 K/uL   RBC 3.20 (L) 3.87 - 5.11 MIL/uL   Hemoglobin 9.8 (L) 12.0 - 15.0 g/dL   HCT 30.3 (L) 36.0 - 46.0 %   MCV 94.7 80.0 - 100.0 fL   MCH 30.6 26.0 - 34.0 pg   MCHC 32.3 30.0 - 36.0 g/dL   RDW 14.9 11.5 - 15.5 %   Platelets 162  150 - 400 K/uL   nRBC 0.0 0.0 - 0.2 %    Comment: Performed at [facility], [address], [place], [place] [address]  Fetal screen     Status: None   Collection Time: 02/11/18  3:52 AM  Result Value Ref Range   Fetal Screen      NEG Performed at [facility], [address] [place], [place] [address]   Rhogam injection     Status: None   Collection Time: 02/11/18  3:52 AM  Result Value Ref Range   Unit Number [id number]    Blood Component Type RHIG    Unit division 00    Status of Unit ISSUED,FINAL    Transfusion Status      OK TO TRANSFUSE Performed at [facility], [place], [place] [address]   Glucose, capillary     Status: None   Collection Time: 02/11/18  7:50 AM  Result Value Ref Range   Glucose-Capillary 83 70 - 99 mg/dL     Assessment:   28 y.o. [id number] postoperative day # 2-stable s/p repeat Cesarean section GDMA2- blood sugars were normal in first 24 hours post delivery   Plan:  1) Acute blood loss anemia - hemodynamically stable and asymptomatic - po vitamins with iron  2)O neg and baby O POS-received Rhogam RNI-desires MMR at discharge VI  3) TDAP status -given 12/23  4) Breast/Bottle/Contraception-hx of amenorrhea-discussed use of Mirena/ Nexplanon/ intermittent Provera use or minipill to protect endometrium. She conceived with all pregnancies with ovulation induction  5) [place] home today if baby discharged.

## 2018-02-12 NOTE — Anesthesia Postprocedure Evaluation (Signed)
Anesthesia Post Note  Patient: Denise Walker  Procedure(s) Performed: CESAREAN SECTION (N/A )  Patient location during evaluation: Mother Baby Anesthesia Type: Spinal Level of consciousness: awake and alert and oriented Pain management: pain level controlled Vital Signs Assessment: post-procedure vital signs reviewed and stable Respiratory status: spontaneous breathing and respiratory function stable Cardiovascular status: stable Postop Assessment: no headache, no backache, no apparent nausea or vomiting, patient able to bend at knees, able to ambulate and adequate PO intake Anesthetic complications: no     Last Vitals:  Vitals:   02/11/18 2053 02/12/18 0055  BP: 98/70 107/81  Pulse: 91 90  Resp: 20 20  Temp: 36.7 C 36.6 C  SpO2: 100%     Last Pain:  Vitals:   02/12/18 0631  TempSrc:   PainSc: 7                  Zachary George

## 2018-02-14 ENCOUNTER — Encounter: Payer: Self-pay | Admitting: Obstetrics & Gynecology

## 2018-02-16 ENCOUNTER — Ambulatory Visit (INDEPENDENT_AMBULATORY_CARE_PROVIDER_SITE_OTHER): Payer: BLUE CROSS/BLUE SHIELD | Admitting: Obstetrics and Gynecology

## 2018-02-16 ENCOUNTER — Encounter: Payer: Self-pay | Admitting: Obstetrics and Gynecology

## 2018-02-16 ENCOUNTER — Telehealth: Payer: Self-pay | Admitting: Obstetrics and Gynecology

## 2018-02-16 VITALS — BP 142/90 | HR 65 | Wt 177.0 lb

## 2018-02-16 DIAGNOSIS — Z4889 Encounter for other specified surgical aftercare: Secondary | ICD-10-CM

## 2018-02-16 NOTE — Progress Notes (Signed)
Postoperative Follow-up Patient presents post op from RLTCS 1weeks ago for unctonrolled GDM and history of prior cesarean.  Subjective: Patient reports marked improvement in her preop symptoms. Eating a regular diet without difficulty. Pain is controlled without any medications.  Activity: normal activities of daily living.  No headaches or vision changes. Has noted some increased lower extremity edema.  Objective: Blood pressure (!) 142/90, pulse 65, weight 177 lb (80.3 kg), currently breastfeeding. General: NAD Pulmonary: no increased work of breathing Abdomen: soft, non-tender, non-distended, incision D/C/I Extremities: no edema Neurologic: normal gait    Admission on 02/10/2018, Discharged on 02/12/2018  Component Date Value Ref Range Status  . WBC 02/10/2018 12.3* 4.0 - 10.5 K/uL Final  . RBC 02/10/2018 3.82* 3.87 - 5.11 MIL/uL Final  . Hemoglobin 02/10/2018 11.7* 12.0 - 15.0 g/dL Final  . HCT 92/02/69 35.3* 36.0 - 46.0 % Final  . MCV 02/10/2018 92.4  80.0 - 100.0 fL Final  . MCH 02/10/2018 30.6  26.0 - 34.0 pg Final  . MCHC 02/10/2018 33.1  30.0 - 36.0 g/dL Final  . RDW 21/97/5883 15.1  11.5 - 15.5 % Final  . Platelets 02/10/2018 184  150 - 400 K/uL Final  . nRBC 02/10/2018 0.0  0.0 - 0.2 % Final   Performed at [facility], [address], [place], [place] [address]  . HIV-1 P24 Antigen - HIV24 02/10/2018 NON REACTIVE  NON REACTIVE Final  . HIV 1/2 Antibodies 02/10/2018 NON REACTIVE  NON REACTIVE Final  . Interpretation (HIV Ag Ab) 02/10/2018 A non reactive test result means that HIV 1 or HIV 2 antibodies and HIV 1 p24 antigen were not detected in the specimen.   Final   Performed at [facility], [address] [place], [place] [address]  . RPR Ser Ql 02/10/2018 Non Reactive  Non Reactive Final   Comment: (NOTE) Performed At: [facility] [address] [place], [place] [id number] Jolene Schimke MD GS:[id number]   . ABO/RH(D)  02/10/2018 O NEG   Final  . Antibody Screen 02/10/2018 POS   Final  . Sample Expiration 02/10/2018 02/13/2018   Final  . Antibody Identification 02/10/2018 PASSIVELY ACQUIRED ANTI-D   Final  . Unit Number 02/10/2018 [id number]   Final  . Blood Component Type 02/10/2018 RED CELLS,LR   Final  . Unit division 02/10/2018 00   Final  . Status of Unit 02/10/2018 REL FROM [facility]   Final  . Transfusion Status 02/10/2018 OK TO TRANSFUSE   Final  . Crossmatch Result 02/10/2018    Final                   Value:COMPATIBLE Performed at [facility], [address] [place], [place] [address]   . Unit Number 02/10/2018 [id number]   Final  . Blood Component Type 02/10/2018 RBC LR PHER1   Final  . Unit division 02/10/2018 00   Final  . Status of Unit 02/10/2018 REL [facility]   Final  . Transfusion Status 02/10/2018 OK TO TRANSFUSE   Final  . Crossmatch Result 02/10/2018 COMPATIBLE   Final  . Glucose-Capillary 02/10/2018 63* 70 - 99 mg/dL Final  . Glucose-Capillary 02/10/2018 98  70 - 99 mg/dL Final  . Blood Product Unit Number 02/10/2018 [id number]   Final  . Unit Type and Rh 02/10/2018 9500   Final  . Blood Product Expiration Date 02/10/2018 [id number]   Final  . Blood Product Unit Number 02/10/2018 [id number]   Final  .  Unit Type and Rh 02/10/2018 9500   Final  . Blood Product Expiration Date 02/10/2018 [id number]   Final  . WBC 02/11/2018 12.1* 4.0 - 10.5 K/uL Final  . RBC 02/11/2018 3.20* 3.87 - 5.11 MIL/uL Final  . Hemoglobin 02/11/2018 9.8* 12.0 - 15.0 g/dL Final  . HCT 63/84/6659 30.3* 36.0 - 46.0 % Final  . MCV 02/11/2018 94.7  80.0 - 100.0 fL Final  . MCH 02/11/2018 30.6  26.0 - 34.0 pg Final  . MCHC 02/11/2018 32.3  30.0 - 36.0 g/dL Final  . RDW 93/57/0177 14.9  11.5 - 15.5 % Final  . Platelets 02/11/2018 162  150 - 400 K/uL Final  . nRBC 02/11/2018 0.0  0.0 - 0.2 % Final   Performed at [facility], [address], [place], [place] [address]  .  Fetal Screen 02/11/2018    Final                   Value:NEG Performed at [facility], [address] [place] [place], [place] [address]   . Unit Number 02/11/2018 [id number]   Final  . Blood Component Type 02/11/2018 RHIG   Final  . Unit division 02/11/2018 00   Final  . Status of Unit 02/11/2018 ISSUED,FINAL   Final  . Transfusion Status 02/11/2018    Final                   Value:OK TO TRANSFUSE Performed at [facility], [address] [place], [place] [address]   . Glucose-Capillary 02/10/2018 124* 70 - 99 mg/dL Final  . Glucose-Capillary 02/11/2018 114* 70 - 99 mg/dL Final  . Glucose-Capillary 02/11/2018 83  70 - 99 mg/dL Final    Assessment: 28 y.o. s/p RLTCS stable  Plan: Patient has done well after surgery with no apparent complications.  I have discussed the post-operative course to date, and the expected progress moving forward.  The patient understands what complications to be concerned about.  I will see the patient in routine follow up, or sooner if needed.    Activity plan: No heavy lifting.  - BP mild range asymptomatic, will continue to monitor given history of eclampsia.  Home readings normotensive to mild range on severe range BP   Vena Austria, MD, [facility], [facility] 02/16/2018, 4:07 PM

## 2018-02-16 NOTE — Telephone Encounter (Signed)
Per pt prefers to see AMS for BP check schedule today 02/16/17 at 3:50

## 2018-02-16 NOTE — Telephone Encounter (Signed)
-----   Message from Nadara Mustard, MD sent at 02/15/2018 11:30 AM EST ----- Regarding: appt Please move appt to sooner for BP check as well as post op w dr Jean Rosenthal, needs appt Monday 02/16/2018

## 2018-02-20 ENCOUNTER — Ambulatory Visit: Payer: BLUE CROSS/BLUE SHIELD | Admitting: Obstetrics and Gynecology

## 2018-02-23 ENCOUNTER — Encounter: Payer: BLUE CROSS/BLUE SHIELD | Admitting: Obstetrics and Gynecology

## 2018-02-23 ENCOUNTER — Other Ambulatory Visit: Payer: BLUE CROSS/BLUE SHIELD

## 2018-02-23 ENCOUNTER — Ambulatory Visit (INDEPENDENT_AMBULATORY_CARE_PROVIDER_SITE_OTHER): Payer: BLUE CROSS/BLUE SHIELD | Admitting: Obstetrics and Gynecology

## 2018-02-23 VITALS — BP 136/98 | HR 82 | Wt 166.0 lb

## 2018-02-23 DIAGNOSIS — Z4889 Encounter for other specified surgical aftercare: Secondary | ICD-10-CM

## 2018-02-23 DIAGNOSIS — F53 Postpartum depression: Secondary | ICD-10-CM

## 2018-02-23 DIAGNOSIS — O99345 Other mental disorders complicating the puerperium: Secondary | ICD-10-CM

## 2018-02-23 DIAGNOSIS — Z0130 Encounter for examination of blood pressure without abnormal findings: Secondary | ICD-10-CM

## 2018-02-23 MED ORDER — ESCITALOPRAM OXALATE 10 MG PO TABS: 10.0000 mg | ORAL_TABLET | Freq: Every day | ORAL | 3 refills | Status: DC

## 2018-02-23 NOTE — Progress Notes (Signed)
Postoperative Follow-up Patient presents post op from RLTCS 3weeks ago for poorly controlled insulin dependent gestational diabetes.  Subjective: Patient reports some improvement in her preop symptoms. Eating a regular diet without difficulty. Pain is controlled without any medications.  Activity: normal activities of daily living.  Objective: Blood pressure (!) 136/98, pulse 82, weight 166 lb (75.3 kg), currently breastfeeding.  Admission on 02/10/2018, Discharged on 02/12/2018  Component Date Value Ref Range Status  . WBC 02/10/2018 12.3* 4.0 - 10.5 K/uL Final  . RBC 02/10/2018 3.82* 3.87 - 5.11 MIL/uL Final  . Hemoglobin 02/10/2018 11.7* 12.0 - 15.0 g/dL Final  . HCT 16/10/960412/31/2019 35.3* 36.0 - 46.0 % Final  . MCV 02/10/2018 92.4  80.0 - 100.0 fL Final  . MCH 02/10/2018 30.6  26.0 - 34.0 pg Final  . MCHC 02/10/2018 33.1  30.0 - 36.0 g/dL Final  . RDW 54/09/811912/31/2019 15.1  11.5 - 15.5 % Final  . Platelets 02/10/2018 184  150 - 400 K/uL Final  . nRBC 02/10/2018 0.0  0.0 - 0.2 % Final   Performed at [facility], [address] [place], [place] [address]  . HIV-1 P24 Antigen - HIV24 02/10/2018 NON REACTIVE  NON REACTIVE Final  . HIV 1/2 Antibodies 02/10/2018 NON REACTIVE  NON REACTIVE Final  . Interpretation (HIV Ag Ab) 02/10/2018 A non reactive test result means that HIV 1 or HIV 2 antibodies and HIV 1 p24 antigen were not detected in the specimen.   Final   Performed at [facility], [address] [place], [place] [address]  . RPR Ser Ql 02/10/2018 Non Reactive  Non Reactive Final   Comment: (NOTE) Performed At: [facility] [address] [place], [place] [id number] Jolene SchimkeNagendra Sanjai MD [phone]   . ABO/RH(D) 02/10/2018 O NEG   Final  . Antibody Screen 02/10/2018 POS   Final  . Sample Expiration 02/10/2018 02/13/2018   Final  . Antibody Identification 02/10/2018 PASSIVELY ACQUIRED ANTI-D   Final  . Unit Number 02/10/2018 [id number]   Final    . Blood Component Type 02/10/2018 RED CELLS,LR   Final  . Unit division 02/10/2018 00   Final  . Status of Unit 02/10/2018 [facility]   Final  . Transfusion Status 02/10/2018 OK TO TRANSFUSE   Final  . Crossmatch Result 02/10/2018    Final                   Value:COMPATIBLE Performed at [facility], [address] [place], [place] [address]   . Unit Number 02/10/2018 [id number]   Final  . Blood Component Type 02/10/2018 RBC LR PHER1   Final  . Unit division 02/10/2018 00   Final  . Status of Unit 02/10/2018 [facility]   Final  . Transfusion Status 02/10/2018 OK TO TRANSFUSE   Final  . Crossmatch Result 02/10/2018 COMPATIBLE   Final  . Glucose-Capillary 02/10/2018 63* 70 - 99 mg/dL Final  . Glucose-Capillary 02/10/2018 98  70 - 99 mg/dL Final  . Blood Product Unit Number 02/10/2018 [id number]   Final  . Unit Type and Rh 02/10/2018 9500   Final  . Blood Product Expiration Date 02/10/2018 [id number]   Final  . Blood Product Unit Number 02/10/2018 [id number]   Final  . Unit Type and Rh 02/10/2018 9500   Final  . Blood Product Expiration Date 02/10/2018 [id number]   Final  . WBC 02/11/2018 12.1* 4.0 - 10.5 K/uL Final  . RBC  02/11/2018 3.20* 3.87 - 5.11 MIL/uL Final  . Hemoglobin 02/11/2018 9.8* 12.0 - 15.0 g/dL Final  . HCT 28/36/6294 30.3* 36.0 - 46.0 % Final  . MCV 02/11/2018 94.7  80.0 - 100.0 fL Final  . MCH 02/11/2018 30.6  26.0 - 34.0 pg Final  . MCHC 02/11/2018 32.3  30.0 - 36.0 g/dL Final  . RDW 76/54/6503 14.9  11.5 - 15.5 % Final  . Platelets 02/11/2018 162  150 - 400 K/uL Final  . nRBC 02/11/2018 0.0  0.0 - 0.2 % Final   Performed at [facility], [address], [place], [place] [address]  . Fetal Screen 02/11/2018    Final                   Value:NEG Performed at [facility], [address] [place] [address] [place] [place], [place] [address]   . Unit Number 02/11/2018 [id number]   Final  . Blood Component Type 02/11/2018 RHIG   Final   . Unit division 02/11/2018 00   Final  . Status of Unit 02/11/2018 ISSUED,FINAL   Final  . Transfusion Status 02/11/2018    Final                   Value:OK TO TRANSFUSE Performed at [facility], [address], [place], [place] [address]   . Glucose-Capillary 02/10/2018 124* 70 - 99 mg/dL Final  . Glucose-Capillary 02/11/2018 114* 70 - 99 mg/dL Final  . Glucose-Capillary 02/11/2018 83  70 - 99 mg/dL Final    Assessment: 28 y.o. s/p RLTCS stable  Plan: Patient has done well after surgery with no apparent complications.  I have discussed the post-operative course to date, and the expected progress moving forward.  The patient understands what complications to be concerned about.  I will see the patient in routine follow up, or sooner if needed.    Activity plan: No heavy lifting.  BP remains mild range, no further follow up warranted  Some increasing anxiety start Lexapro given history of postpartum depression  Vena Austria, MD, [facility], [facility] 02/23/2018, 12:07 PM

## 2018-03-04 ENCOUNTER — Ambulatory Visit (INDEPENDENT_AMBULATORY_CARE_PROVIDER_SITE_OTHER): Payer: BLUE CROSS/BLUE SHIELD | Admitting: Obstetrics and Gynecology

## 2018-03-04 ENCOUNTER — Encounter: Payer: Self-pay | Admitting: Obstetrics and Gynecology

## 2018-03-04 VITALS — BP 140/86 | HR 80 | Wt 162.0 lb

## 2018-03-04 DIAGNOSIS — F53 Postpartum depression: Secondary | ICD-10-CM

## 2018-03-04 DIAGNOSIS — O99345 Other mental disorders complicating the puerperium: Secondary | ICD-10-CM

## 2018-03-04 MED ORDER — ESCITALOPRAM OXALATE 20 MG PO TABS: 20.0000 mg | ORAL_TABLET | Freq: Every day | ORAL | 3 refills | Status: DC

## 2018-03-04 NOTE — Progress Notes (Signed)
Obstetrics & Gynecology Office Visit   Chief Complaint:  Chief Complaint  Patient presents with  . Follow-up    medication follow up pp depression    History of Present Illness: The patient is a 28 y.o. female presenting follow up for symptoms of anxiety.  The patient is currently taking Lexparo 10mg  for the management of her symptoms.  She has had any recent situational stressors, namely child birth.  She reports symptoms of irritability, social anxiety and agorophobia.  She denies anhedonia, risk taking behavior, feelings of guilt, feelings of worthlessness, suicidal ideation, homicidal ideation, auditory hallucinations and visual hallucinations. Symptoms have improved since last visit.     She reports that she still has some irrational fears regarding driving (mainly interstate driving), size of nipple she is using for bottle feeding (worried that faster flow nipple will choke baby), and blood pressure (still checking blood pressure twice a day).    The patient does have a pre-existing history of depression and anxiety.  She  does not a prior history of suicide attempts.    Review of Systems: Review of Systems  Constitutional: Negative.   Gastrointestinal: Negative.   Neurological: Negative for headaches.  Psychiatric/Behavioral: Negative for depression, hallucinations, memory loss, substance abuse and suicidal ideas. The patient is nervous/anxious. The patient does not have insomnia.      Past Medical History:  Past Medical History:  Diagnosis Date  . Gestational diabetes   . PCOS (polycystic ovarian syndrome)     Past Surgical History:  Past Surgical History:  Procedure Laterality Date  . CESAREAN SECTION N/A 07/15/2014   Procedure: CESAREAN SECTION;  Surgeon: Nadara Mustard, MD;  Location: [facility] ORS;  Service: Obstetrics;  Laterality: N/A;  . CESAREAN SECTION N/A 02/10/2018   Procedure: CESAREAN SECTION;  Surgeon: Conard Novak, MD;  Location: [facility] ORS;  Service:  Obstetrics;  Laterality: N/A;  . HERNIA REPAIR  10/2013  . WISDOM TOOTH EXTRACTION      Gynecologic History: No LMP recorded.  Obstetric History: [id number]  Family History:  Family History  Problem Relation Age of Onset  . Cancer Mother   . Diabetes Paternal Grandfather   . Diabetes Paternal Aunt     Social History:  Social History   Socioeconomic History  . Marital status: Married    Spouse name: Not on file  . Number of children: Not on file  . Years of education: Not on file  . Highest education level: Not on file  Occupational History  . Not on file  Social Needs  . Financial resource strain: Not hard at all  . Food insecurity:    Worry: Never true    Inability: Never true  . Transportation needs:    Medical: No    Non-medical: No  Tobacco Use  . Smoking status: Never Smoker  . Smokeless tobacco: Never Used  Substance and Sexual Activity  . Alcohol use: No  . Drug use: No  . Sexual activity: Yes    Birth control/protection: Other-see comments    Comment: will discuss with MD  Lifestyle  . Physical activity:    Days per week: 0 days    Minutes per session: 0 min  . Stress: To some extent  Relationships  . Social connections:    Talks on phone: More than three times a week    Gets together: Three times a week    Attends religious service: Never    Active member of club or organization: No  Attends meetings of clubs or organizations: Never    Relationship status: Married  . Intimate partner violence:    Fear of current or ex partner: No    Emotionally abused: No    Physically abused: No    Forced sexual activity: No  Other Topics Concern  . Not on file  Social History Narrative  . Not on file    Allergies:  Allergies  Allergen Reactions  . Fish Allergy Shortness Of Breath    Medications: Prior to Admission medications   Medication Sig Start Date End Date Taking? Authorizing Provider  docusate sodium (COLACE) 100 MG capsule Take 1 capsule  (100 mg total) by mouth daily as needed. 02/12/18 02/12/19  Farrel Conners, CNM  escitalopram (LEXAPRO) 10 MG tablet Take 1 tablet (10 mg total) by mouth daily. 02/23/18   Vena Austria, MD  ferrous sulfate (FERROUSUL) 325 (65 FE) MG tablet Take 1 tablet (325 mg total) by mouth daily. 12/09/17   Vena Austria, MD  fluticasone [facility]) 50 MCG/ACT nasal spray USE 2 SPRAYS INTO EACH NOSTRIL TWICE A DAY Patient not taking: Reported on 02/16/2018 02/12/18   Farrel Conners, CNM  ibuprofen (ADVIL,MOTRIN) 600 MG tablet Take 1 tablet (600 mg total) by mouth every 6 (six) hours. 02/12/18   Farrel Conners, CNM  Prenatal MV-Min-Fe Fum-FA-DHA (PRENATAL 1 PO) Take 1 tablet by mouth daily.    [provider]    Physical Exam Vitals:  Vitals:   03/04/18 1132  BP: 140/86  Pulse: 80   No LMP recorded.  General: NAD HEENT: normocephalic, anicteric Pulmonary: No increased work of breathing Neurologic: Grossly intact Psychiatric: mood appropriate, affect full  Edinburgh Postnatal Depression Scale - 03/04/18 1134      Edinburgh Postnatal Depression Scale:  In the Past 7 Days   I have been able to laugh and see the funny side of things.  1    I have looked forward with enjoyment to things.  1    I have blamed myself unnecessarily when things went wrong.  2    I have been anxious or worried for no good reason.  2    I have felt scared or panicky for no good reason.  3    Things have been getting on top of me.  2    I have been so unhappy that I have had difficulty sleeping.  1    I have felt sad or miserable.  1    I have been so unhappy that I have been crying.  0    The thought of harming myself has occurred to me.  0    Edinburgh Postnatal Depression Scale Total  13       No flowsheet data found.  Depression screen [facility] 2/9 09/12/2017  Decreased Interest 0  Down, Depressed, Hopeless 0  PHQ - 2 Score 0    Depression screen PHQ 2/9 09/12/2017  Decreased Interest 0  Down,  Depressed, Hopeless 0  PHQ - 2 Score 0     Assessment: 28 y.o. [id number] follow up anxiety   Plan: Problem List Items Addressed This Visit    None    Visit Diagnoses    Postpartum depression    -  Primary   Relevant Medications   escitalopram (LEXAPRO) 20 MG tablet      1) Anxiety - EPDS of 13 - increase lexapro to 20mg  - we had a long discussion about rational vs irrational fears.  I recommend she chose on thing  at  Time then confront that particular fear.  IE stopping BP measurement, changing to a larger size nipple on purpose, and [place] with her [place].    2) Thyroid and B12 screen has not been obtained previously   Vena AustriaAndreas Xxavier Noon, MD, Merlinda FrederickFACOG [facility], [facility]

## 2018-03-12 ENCOUNTER — Ambulatory Visit (INDEPENDENT_AMBULATORY_CARE_PROVIDER_SITE_OTHER): Payer: BLUE CROSS/BLUE SHIELD | Admitting: Obstetrics and Gynecology

## 2018-03-12 ENCOUNTER — Encounter: Payer: Self-pay | Admitting: Obstetrics and Gynecology

## 2018-03-12 VITALS — BP 131/81 | HR 78 | Wt 163.0 lb

## 2018-03-12 DIAGNOSIS — F53 Postpartum depression: Secondary | ICD-10-CM

## 2018-03-12 DIAGNOSIS — Z8632 Personal history of gestational diabetes: Secondary | ICD-10-CM

## 2018-03-12 DIAGNOSIS — O99345 Other mental disorders complicating the puerperium: Secondary | ICD-10-CM

## 2018-03-12 NOTE — Progress Notes (Signed)
Obstetrics & Gynecology Office Visit   Chief Complaint:  Chief Complaint  Patient presents with  . Follow-up    postpartum depression    History of Present Illness: The patient is a 28 y.o. female presenting follow up for symptoms of anxiety.  The patient is currently taking Lexparo 20mg  for the management of her symptoms.  She has had any recent situational stressors, recent birth of second child.  She reports symptoms of irritability, social anxiety and agorophobia.  She denies feelings of guilt, feelings of worthlessness, suicidal ideation, homicidal ideation, auditory hallucinations and visual hallucinations. Symptoms have remained unchanged since last visit.     The patient does have a pre-existing history of depression and anxiety.  She  does a prior history of suicide attempts.    Review of Systems: Review of Systems  Constitutional: Negative.   Skin: Negative.   Neurological: Negative for headaches.  Psychiatric/Behavioral: Negative for depression, hallucinations, memory loss, substance abuse and suicidal ideas. The patient is nervous/anxious and has insomnia.      Past Medical History:  Past Medical History:  Diagnosis Date  . Gestational diabetes   . PCOS (polycystic ovarian syndrome)     Past Surgical History:  Past Surgical History:  Procedure Laterality Date  . CESAREAN SECTION N/A 07/15/2014   Procedure: CESAREAN SECTION;  Surgeon: Nadara Mustard, MD;  Location: [facility] ORS;  Service: Obstetrics;  Laterality: N/A;  . CESAREAN SECTION N/A 02/10/2018   Procedure: CESAREAN SECTION;  Surgeon: Conard Novak, MD;  Location: [facility] ORS;  Service: Obstetrics;  Laterality: N/A;  . HERNIA REPAIR  10/2013  . WISDOM TOOTH EXTRACTION      Gynecologic History: No LMP recorded.  Obstetric History: [id number]  Family History:  Family History  Problem Relation Age of Onset  . Cancer Mother   . Diabetes Paternal Grandfather   . Diabetes Paternal Aunt     Social  History:  Social History   Socioeconomic History  . Marital status: Married    Spouse name: Not on file  . Number of children: Not on file  . Years of education: Not on file  . Highest education level: Not on file  Occupational History  . Not on file  Social Needs  . Financial resource strain: Not hard at all  . Food insecurity:    Worry: Never true    Inability: Never true  . Transportation needs:    Medical: No    Non-medical: No  Tobacco Use  . Smoking status: Never Smoker  . Smokeless tobacco: Never Used  Substance and Sexual Activity  . Alcohol use: No  . Drug use: No  . Sexual activity: Yes    Birth control/protection: Other-see comments    Comment: will discuss with MD  Lifestyle  . Physical activity:    Days per week: 0 days    Minutes per session: 0 min  . Stress: To some extent  Relationships  . Social connections:    Talks on phone: More than three times a week    Gets together: Three times a week    Attends religious service: Never    Active member of club or organization: No    Attends meetings of clubs or organizations: Never    Relationship status: Married  . Intimate partner violence:    Fear of current or ex partner: No    Emotionally abused: No    Physically abused: No    Forced sexual activity: No  Other  Topics Concern  . Not on file  Social History Narrative  . Not on file    Allergies:  Allergies  Allergen Reactions  . Fish Allergy Shortness Of Breath    Medications: Prior to Admission medications   Medication Sig Start Date End Date Taking? Authorizing Provider  docusate sodium (COLACE) 100 MG capsule Take 1 capsule (100 mg total) by mouth daily as needed. 02/12/18 02/12/19  Farrel Conners, CNM  escitalopram (LEXAPRO) 20 MG tablet Take 1 tablet (20 mg total) by mouth daily. 03/04/18   Vena Austria, MD  ferrous sulfate (FERROUSUL) 325 (65 FE) MG tablet Take 1 tablet (325 mg total) by mouth daily. 12/09/17   Vena Austria, MD    fluticasone [facility]) 50 MCG/ACT nasal spray USE 2 SPRAYS INTO EACH NOSTRIL TWICE A DAY Patient not taking: Reported on 02/16/2018 02/12/18   Farrel Conners, CNM  ibuprofen (ADVIL,MOTRIN) 600 MG tablet Take 1 tablet (600 mg total) by mouth every 6 (six) hours. 02/12/18   Farrel Conners, CNM  Prenatal MV-Min-Fe Fum-FA-DHA (PRENATAL 1 PO) Take 1 tablet by mouth daily.    [provider]    Physical Exam Vitals:  Vitals:   03/12/18 1432  BP: 131/81  Pulse: 78   No LMP recorded.  General: NAD HEENT: normocephalic, anicteric Pulmonary: No increased work of breathing Neurologic: Grossly intact Psychiatric: mood appropriate, affect full    GAD 7 : Generalized Anxiety Score 03/12/2018  Nervous, Anxious, on Edge 2  Control/stop worrying 2  Worry too much - different things 1  Trouble relaxing 1  Restless 0  Easily annoyed or irritable 1  Afraid - awful might happen 1  Total GAD 7 Score 8    Depression screen [facility] 2/9 03/12/2018 09/12/2017  Decreased Interest 0 0  Down, Depressed, Hopeless 1 0  PHQ - 2 Score 1 0  Altered sleeping 2 -  Tired, decreased energy 2 -  Change in appetite 0 -  Feeling bad or failure about yourself  0 -  Trouble concentrating 0 -  Moving slowly or fidgety/restless 0 -  Suicidal thoughts 0 -  PHQ-9 Score 5 -    Depression screen [facility] 2/9 03/12/2018 09/12/2017  Decreased Interest 0 0  Down, Depressed, Hopeless 1 0  PHQ - 2 Score 1 0  Altered sleeping 2 -  Tired, decreased energy 2 -  Change in appetite 0 -  Feeling bad or failure about yourself  0 -  Trouble concentrating 0 -  Moving slowly or fidgety/restless 0 -  Suicidal thoughts 0 -  PHQ-9 Score 5 -     Assessment: 28 y.o. [id number] follow up postpartum depression/anxiet  Plan: Problem List Items Addressed This Visit    None    Visit Diagnoses    History of gestational diabetes    -  Primary   Relevant Orders   Glucose tolerance, 2 hours   6 weeks postpartum follow-up        Relevant Orders   Glucose tolerance, 2 hours   Postpartum depression          1) GAD-7 is 8 PHQ-9 is 5 - continue lexparo at 20mg   2) Thyroid and B12 screen has not been obtained previously  3) Return in about 1 week (around 03/19/2018) for 6 week postpartum, medication follow up, 2-hr glucose ([place]).    Vena Austria, MD, Evern Core [facility], [facility] 03/12/2018, 3:42 PM

## 2018-03-12 NOTE — Telephone Encounter (Signed)
na

## 2018-03-18 ENCOUNTER — Encounter: Payer: Self-pay | Admitting: Obstetrics and Gynecology

## 2018-03-18 ENCOUNTER — Other Ambulatory Visit: Payer: BLUE CROSS/BLUE SHIELD

## 2018-03-18 ENCOUNTER — Ambulatory Visit: Payer: BLUE CROSS/BLUE SHIELD | Admitting: Obstetrics and Gynecology

## 2018-03-18 ENCOUNTER — Ambulatory Visit (INDEPENDENT_AMBULATORY_CARE_PROVIDER_SITE_OTHER): Payer: BLUE CROSS/BLUE SHIELD | Admitting: Obstetrics and Gynecology

## 2018-03-18 DIAGNOSIS — Z8632 Personal history of gestational diabetes: Secondary | ICD-10-CM

## 2018-03-19 LAB — GLUCOSE TOLERANCE, 2 HOURS
GLUCOSE, 2 HOUR: 71 mg/dL (ref 65–139)
Glucose, GTT - Fasting: 85 mg/dL (ref 65–99)

## 2018-03-20 NOTE — Progress Notes (Signed)
Postpartum Visit  Chief Complaint:  Chief Complaint  Patient presents with  . Postpartum Care    2 Hour GTT today    History of Present Illness: Patient is a 28 y.o. [id number] presents for postpartum visit.  Date of delivery:  Cesarean Section: Elective repeat Pregnancy or labor problems:  Yes GDM Any problems since the delivery:  no  Newborn Details:  SINGLETON :  1. BabyGender female. Birth weight:   Maternal Details:  Breast or formula feeding: plans to breastfeed Any bowel or bladder issues: No  Post partum depression/anxiety noted:  Yes currently under treatment  Review of Systems: Review of Systems  Constitutional: Negative.   Gastrointestinal: Negative.   Genitourinary: Negative.   Skin: Negative.   Psychiatric/Behavioral: Negative for depression, hallucinations, memory loss, substance abuse and suicidal ideas. The patient is nervous/anxious and has insomnia.     The following portions of the patient's history were reviewed and updated as appropriate: allergies, current medications, past family history, past medical history, past social history, past surgical history and problem list.  Past Medical History:  Past Medical History:  Diagnosis Date  . Gestational diabetes   . PCOS (polycystic ovarian syndrome)     Past Surgical History:  Past Surgical History:  Procedure Laterality Date  . CESAREAN SECTION N/A 07/15/2014   Procedure: CESAREAN SECTION;  Surgeon: Nadara Mustard, MD;  Location: [facility] ORS;  Service: Obstetrics;  Laterality: N/A;  . CESAREAN SECTION N/A 02/10/2018   Procedure: CESAREAN SECTION;  Surgeon: Conard Novak, MD;  Location: [facility] ORS;  Service: Obstetrics;  Laterality: N/A;  . HERNIA REPAIR  10/2013  . WISDOM TOOTH EXTRACTION      Family History:  Family History  Problem Relation Age of Onset  . Cancer Mother   . Diabetes Paternal Grandfather   . Diabetes Paternal Aunt     Social History:  Social History   Socioeconomic  History  . Marital status: Married    Spouse name: Not on file  . Number of children: Not on file  . Years of education: Not on file  . Highest education level: Not on file  Occupational History  . Not on file  Social Needs  . Financial resource strain: Not hard at all  . Food insecurity:    Worry: Never true    Inability: Never true  . Transportation needs:    Medical: No    Non-medical: No  Tobacco Use  . Smoking status: Never Smoker  . Smokeless tobacco: Never Used  Substance and Sexual Activity  . Alcohol use: No  . Drug use: No  . Sexual activity: Yes    Birth control/protection: Other-see comments    Comment: will discuss with MD  Lifestyle  . Physical activity:    Days per week: 0 days    Minutes per session: 0 min  . Stress: To some extent  Relationships  . Social connections:    Talks on phone: More than three times a week    Gets together: Three times a week    Attends religious service: Never    Active member of club or organization: No    Attends meetings of clubs or organizations: Never    Relationship status: Married  . Intimate partner violence:    Fear of current or ex partner: No    Emotionally abused: No    Physically abused: No    Forced sexual activity: No  Other Topics Concern  . Not on file  Social History Narrative  . Not on file    Allergies:  Allergies  Allergen Reactions  . Fish Allergy Shortness Of Breath    Medications: Prior to Admission medications   Medication Sig Start Date End Date Taking? Authorizing Provider  docusate sodium (COLACE) 100 MG capsule Take 1 capsule (100 mg total) by mouth daily as needed. 02/12/18 02/12/19  Farrel Conners, CNM  escitalopram (LEXAPRO) 20 MG tablet Take 1 tablet (20 mg total) by mouth daily. 03/04/18   Vena Austria, MD  ferrous sulfate (FERROUSUL) 325 (65 FE) MG tablet Take 1 tablet (325 mg total) by mouth daily. 12/09/17   Vena Austria, MD  fluticasone [facility]) 50 MCG/ACT nasal  spray USE 2 SPRAYS INTO EACH NOSTRIL TWICE A DAY Patient not taking: Reported on 02/16/2018 02/12/18   Farrel Conners, CNM  ibuprofen (ADVIL,MOTRIN) 600 MG tablet Take 1 tablet (600 mg total) by mouth every 6 (six) hours. 02/12/18   Farrel Conners, CNM  Prenatal MV-Min-Fe Fum-FA-DHA (PRENATAL 1 PO) Take 1 tablet by mouth daily.    [provider]    Physical Exam Blood pressure 119/74, pulse 80, weight 164 lb (74.4 kg), currently breastfeeding.  General: NAD HEENT: normocephalic, anicteric Pulmonary: No increased work of breathing Abdomen: NABS, soft, non-tender, non-distended.  Umbilicus without lesions.  No hepatomegaly, splenomegaly or masses palpable. No evidence of hernia. Incision D/C/I Declined pelvic today as kids accompanying her Extremities: no edema, erythema, or tenderness Neurologic: Grossly intact Psychiatric: mood appropriate, affect full    Assessment: 28 y.o. [id number] presenting for 6 week postpartum visit  Plan: Problem List Items Addressed This Visit    None     1) Contraception - Education given regarding options for contraception, as well as compatibility with breast feeding if applicable.   2)  Pap - ASCCP guidelines and rational discussed.  ASCCP guidelines and rational discussed.  Patient opts for every 3 years screening interval  3) Patient underwent screening for postpartum depression symptoms well controlled  4) Return in about 4 weeks (around 04/15/2018) for medication follow.   Vena Austria, MD, [facility], [facility]

## 2018-04-06 ENCOUNTER — Other Ambulatory Visit: Payer: Self-pay | Admitting: Obstetrics and Gynecology

## 2018-04-06 MED ORDER — NORETHINDRONE 0.35 MG PO TABS: 1.0000 | ORAL_TABLET | Freq: Every day | ORAL | 11 refills | Status: DC

## 2018-04-12 DIAGNOSIS — Z1371 Encounter for nonprocreative screening for genetic disease carrier status: Secondary | ICD-10-CM

## 2018-04-12 HISTORY — DX: Encounter for nonprocreative screening for genetic disease carrier status: Z13.71

## 2018-04-15 ENCOUNTER — Ambulatory Visit (INDEPENDENT_AMBULATORY_CARE_PROVIDER_SITE_OTHER): Payer: BLUE CROSS/BLUE SHIELD | Admitting: Obstetrics and Gynecology

## 2018-04-15 ENCOUNTER — Encounter: Payer: Self-pay | Admitting: Obstetrics and Gynecology

## 2018-04-15 VITALS — BP 113/69 | HR 66 | Wt 167.0 lb

## 2018-04-15 DIAGNOSIS — F32A Depression, unspecified: Secondary | ICD-10-CM

## 2018-04-15 DIAGNOSIS — F419 Anxiety disorder, unspecified: Secondary | ICD-10-CM

## 2018-04-15 DIAGNOSIS — Z8041 Family history of malignant neoplasm of ovary: Secondary | ICD-10-CM | POA: Diagnosis not present

## 2018-04-15 DIAGNOSIS — F329 Major depressive disorder, single episode, unspecified: Secondary | ICD-10-CM

## 2018-04-15 MED ORDER — ESCITALOPRAM OXALATE 20 MG PO TABS: 20.0000 mg | ORAL_TABLET | Freq: Every day | ORAL | 3 refills | Status: DC

## 2018-04-15 MED ORDER — HYDROXYZINE HCL 25 MG PO TABS: 25.0000 mg | ORAL_TABLET | Freq: Four times a day (QID) | ORAL | 2 refills | Status: DC | PRN

## 2018-04-15 MED ORDER — NORETHIN-ETH ESTRAD-FE BIPHAS 1 MG-10 MCG / 10 MCG PO TABS: 1.0000 | ORAL_TABLET | Freq: Every day | ORAL | 3 refills | Status: DC

## 2018-04-15 NOTE — Progress Notes (Signed)
Obstetrics & Gynecology Office Visit   Chief Complaint:  Chief Complaint  Patient presents with  . Follow-up    Anxiety/deperssion    History of Present Illness: The patient is a 28 y.o. female presenting follow up for symptoms of anxiety and depression.  The patient is currently taking Lexapro 20mg  for the management of her symptoms.  She has not had any recent situational stressors.  She reports symptoms of irritability, social anxiety, auditory hallucinations and visual hallucinations.  She denies anhedonia, risk taking behavior, agorophobia, feelings of guilt, feelings of worthlessness, suicidal ideation, homicidal ideation, auditory hallucinations and visual hallucinations. Symptoms have improved since last visit.     The patient does have a pre-existing history of depression and anxiety.  She  does not a prior history of suicide attempts.    Review of Systems: Review of Systems  Constitutional: Negative.   Gastrointestinal: Negative for nausea.  Neurological: Negative for headaches.  Psychiatric/Behavioral: Negative.      Past Medical History:  Past Medical History:  Diagnosis Date  . Gestational diabetes   . PCOS (polycystic ovarian syndrome)     Past Surgical History:  Past Surgical History:  Procedure Laterality Date  . CESAREAN SECTION N/A 07/15/2014   Procedure: CESAREAN SECTION;  Surgeon: Nadara Mustard, MD;  Location: [facility] ORS;  Service: Obstetrics;  Laterality: N/A;  . CESAREAN SECTION N/A 02/10/2018   Procedure: CESAREAN SECTION;  Surgeon: Conard Novak, MD;  Location: [facility] ORS;  Service: Obstetrics;  Laterality: N/A;  . HERNIA REPAIR  10/2013  . WISDOM TOOTH EXTRACTION      Gynecologic History: Patient's last menstrual period was 04/03/2018.  Obstetric History: [id number]  Family History:  Family History  Problem Relation Age of Onset  . Cancer Mother   . Diabetes Paternal Grandfather   . Diabetes Paternal Aunt     Social History:  Social  History   Socioeconomic History  . Marital status: Married    Spouse name: Not on file  . Number of children: Not on file  . Years of education: Not on file  . Highest education level: Not on file  Occupational History  . Not on file  Social Needs  . Financial resource strain: Not hard at all  . Food insecurity:    Worry: Never true    Inability: Never true  . Transportation needs:    Medical: No    Non-medical: No  Tobacco Use  . Smoking status: Never Smoker  . Smokeless tobacco: Never Used  Substance and Sexual Activity  . Alcohol use: No  . Drug use: No  . Sexual activity: Yes    Birth control/protection: Other-see comments    Comment: will discuss with MD  Lifestyle  . Physical activity:    Days per week: 0 days    Minutes per session: 0 min  . Stress: To some extent  Relationships  . Social connections:    Talks on phone: More than three times a week    Gets together: Three times a week    Attends religious service: Never    Active member of club or organization: No    Attends meetings of clubs or organizations: Never    Relationship status: Married  . Intimate partner violence:    Fear of current or ex partner: No    Emotionally abused: No    Physically abused: No    Forced sexual activity: No  Other Topics Concern  . Not on file  Social History Narrative  . Not on file    Allergies:  Allergies  Allergen Reactions  . Fish Allergy Shortness Of Breath    Medications: Prior to Admission medications   Medication Sig Start Date End Date Taking? Authorizing Provider  escitalopram (LEXAPRO) 20 MG tablet Take 1 tablet (20 mg total) by mouth daily. 04/15/18  Yes Vena Austria, MD  hydrOXYzine (ATARAX/VISTARIL) 25 MG tablet Take 1 tablet (25 mg total) by mouth every 6 (six) hours as needed for anxiety. 04/15/18   Vena Austria, MD  Norethindrone-Ethinyl Estradiol-Fe Biphas (LO LOESTRIN FE) 1 MG-10 MCG / 10 MCG tablet Take 1 tablet by mouth daily. 04/15/18  07/08/18  Vena Austria, MD    Physical Exam Vitals:  Vitals:   04/15/18 1136  BP: 113/69  Pulse: 66   Patient's last menstrual period was 04/03/2018.  General: NAD HEENT: normocephalic, anicteric Pulmonary: No increased work of breathing Neurologic: Grossly intact Psychiatric: mood appropriate, affect full    GAD 7 : Generalized Anxiety Score 04/15/2018 03/12/2018  Nervous, Anxious, on Edge 1 2  Control/stop worrying 0 2  Worry too much - different things 1 1  Trouble relaxing 1 1  Restless 0 0  Easily annoyed or irritable 1 1  Afraid - awful might happen 0 1  Total GAD 7 Score 4 8    Depression screen [facility] 2/9 04/15/2018 03/12/2018 09/12/2017  Decreased Interest 0 0 0  Down, Depressed, Hopeless 0 1 0  PHQ - 2 Score 0 1 0  Altered sleeping 0 2 -  Tired, decreased energy 1 2 -  Change in appetite 0 0 -  Feeling bad or failure about yourself  0 0 -  Trouble concentrating 1 0 -  Moving slowly or fidgety/restless 0 0 -  Suicidal thoughts 0 0 -  PHQ-9 Score 2 5 -    Depression screen [facility] 2/9 04/15/2018 03/12/2018 09/12/2017  Decreased Interest 0 0 0  Down, Depressed, Hopeless 0 1 0  PHQ - 2 Score 0 1 0  Altered sleeping 0 2 -  Tired, decreased energy 1 2 -  Change in appetite 0 0 -  Feeling bad or failure about yourself  0 0 -  Trouble concentrating 1 0 -  Moving slowly or fidgety/restless 0 0 -  Suicidal thoughts 0 0 -  PHQ-9 Score 2 5 -     Assessment: 28 y.o. [id number] anxiety and depression follow up  Plan: Problem List Items Addressed This Visit    None    Visit Diagnoses    Anxiety and depression    -  Primary   Relevant Medications   escitalopram (LEXAPRO) 20 MG tablet   hydrOXYzine (ATARAX/VISTARIL) 25 MG tablet   Family history of ovarian cancer          1) Anxiety depression - Continue current regimen GAD-7 is 4, PHQ-9 is 2 - Add hydroxizine for anxiety and sleep aid  2) Family history of ovarian cancer -  I offered referral to genetic  counseling for discussion MyRisk testing because of her significant family history of breast and/or ovarian cancer.  If a mutation is found, the patient would be advised of an increased risk of breast cancer and ovarian cancer.  If a mutation is identified other family members could be tested and would have the opportunity to take advantage of approaches to prevention and early detection of breast and ovarian cancer.    - MyRisk testing obtained today  2) Thyroid and B12 screen has  not been obtained previously  3) A total of 15 minutes were spent in face-to-face contact with the patient during this encounter with over half of that time devoted to counseling and coordination of care.  4) Return in about 3 months (around 07/16/2018) for medication follow up.    Vena Austria, MD, [facility], [facility]

## 2018-04-28 ENCOUNTER — Encounter: Payer: Self-pay | Admitting: Obstetrics and Gynecology

## 2018-05-04 ENCOUNTER — Encounter: Payer: Self-pay | Admitting: Obstetrics and Gynecology

## 2018-05-04 DIAGNOSIS — Z1371 Encounter for nonprocreative screening for genetic disease carrier status: Secondary | ICD-10-CM

## 2018-05-04 HISTORY — DX: Encounter for nonprocreative screening for genetic disease carrier status: Z13.71

## 2018-05-07 DIAGNOSIS — G43009 Migraine without aura, not intractable, without status migrainosus: Secondary | ICD-10-CM | POA: Diagnosis not present

## 2018-05-07 DIAGNOSIS — R110 Nausea: Secondary | ICD-10-CM | POA: Diagnosis not present

## 2018-05-21 NOTE — Telephone Encounter (Signed)
Needs NOB telephone visit

## 2018-05-25 NOTE — Telephone Encounter (Signed)
Patient is schedule 04/03/18 with AMS

## 2018-05-28 ENCOUNTER — Telehealth: Payer: Self-pay

## 2018-05-28 NOTE — Telephone Encounter (Signed)
Pt called triage stating she is experiencing "a lot of blood with a bowel movement" she is wondering if she should be concerned and is wanting to speak with Selena Batten to see if she needs to be seen soon. She said she would see SDJ or AMS. Thank you [phone]

## 2018-05-28 NOTE — Telephone Encounter (Signed)
Advised patient it is probably d/t straining with BM's, even internal hemorrhoids could potentially bleed. She is aware to take stool softeners and increase water/green leafy vegetables to help with constipation. She will call back tomorrow if bleeding continues.  KJ CMA

## 2018-06-02 ENCOUNTER — Other Ambulatory Visit: Payer: Self-pay

## 2018-06-02 ENCOUNTER — Encounter: Payer: Self-pay | Admitting: Obstetrics and Gynecology

## 2018-06-02 ENCOUNTER — Ambulatory Visit (INDEPENDENT_AMBULATORY_CARE_PROVIDER_SITE_OTHER): Payer: BLUE CROSS/BLUE SHIELD | Admitting: Obstetrics and Gynecology

## 2018-06-02 VITALS — Wt 167.0 lb

## 2018-06-02 DIAGNOSIS — O0990 Supervision of high risk pregnancy, unspecified, unspecified trimester: Secondary | ICD-10-CM | POA: Insufficient documentation

## 2018-06-02 DIAGNOSIS — O09291 Supervision of pregnancy with other poor reproductive or obstetric history, first trimester: Secondary | ICD-10-CM

## 2018-06-02 DIAGNOSIS — O99211 Obesity complicating pregnancy, first trimester: Secondary | ICD-10-CM

## 2018-06-02 DIAGNOSIS — Z8632 Personal history of gestational diabetes: Secondary | ICD-10-CM | POA: Insufficient documentation

## 2018-06-02 DIAGNOSIS — O34219 Maternal care for unspecified type scar from previous cesarean delivery: Secondary | ICD-10-CM

## 2018-06-02 DIAGNOSIS — O09899 Supervision of other high risk pregnancies, unspecified trimester: Secondary | ICD-10-CM | POA: Insufficient documentation

## 2018-06-02 DIAGNOSIS — Z98891 History of uterine scar from previous surgery: Secondary | ICD-10-CM

## 2018-06-02 DIAGNOSIS — O99210 Obesity complicating pregnancy, unspecified trimester: Secondary | ICD-10-CM | POA: Insufficient documentation

## 2018-06-02 DIAGNOSIS — Z3A08 8 weeks gestation of pregnancy: Secondary | ICD-10-CM

## 2018-06-02 NOTE — Progress Notes (Signed)
I connected with Denise Walker on 06/02/18 at  2:10 PM EDT by telephone and verified that I am speaking with the correct person using two identifiers.   I discussed the limitations, risks, security and privacy concerns of performing an evaluation and management service by telephone and the availability of in person appointments. I also discussed with the patient that there may be a patient responsible charge related to this service. The patient expressed understanding and agreed to proceed.  The patient was at home I spoke with the patient from my workstation phone The names of people involved in this encounter were: Denise Walker , and [name]   New Obstetric Patient H&P    Chief Complaint: "Desires prenatal care"   History of Present Illness: Patient is a 28 y.o. [id number] Not Hispanic or Latino female, presents with amenorrhea and positive home pregnancy test. Patient's last menstrual period was 04/03/2018 (approximate). and based on her  LMP, her EDD is Estimated Date of Delivery: 01/08/19 and her EGA is [redacted]w[redacted]d  Her last pap smear was 07/22/2017 and was no abnormalities.    She had a urine pregnancy test which was positive 2 week(s)  ago. Since her LMP she claims she has experienced fatigue, some anxiety. She denies vaginal bleeding.  Her prior pregnancies are notable for eclampsia G1 pregnancy, short interval pregnancy, postpartum depression, gestational diabetes G2  Since her LMP, she admits to the use of tobacco products  no There are cats in the home in the home  no  She admits close contact with children on a regular basis  yes  She has had chicken pox in the past no She has had Tuberculosis exposures, symptoms, or previously tested positive for TB   no Current or past history of domestic violence. no  Genetic Screening/Teratology Counseling: (Includes patient, baby's father, or anyone in either family with:)   [facility] 2. Thalassemia ([place] or [place] background): MCV<80  no 3. Neural tube defect (meningomyelocele, spina bifida, anencephaly)  no 4. Congenital heart defect  no  5. Down syndrome  no 6. Tay-Sachs (Jewish, [place]  no 7. Canavan's Disease  no 8. Sickle cell disease or trait (African)  no  9. Hemophilia or other blood disorders  no  10. Muscular dystrophy  no  11. Cystic fibrosis  no  12. Huntington's Chorea  no  13. Mental retardation/autism  no 14. Other inherited genetic or chromosomal disorder  no 15. Maternal metabolic disorder (DM, PKU, etc)  no 16. Patient or FOB with a child with a birth defect not listed above no  16a. Patient or FOB with a birth defect themselves no 17. Recurrent pregnancy loss, or stillbirth  no  18. Any medications since LMP other than prenatal vitamins (include vitamins, supplements, OTC meds, drugs, alcohol)  no 19. Any other genetic/environmental exposure to discuss  no  Infection History:   1. Lives with someone with TB or TB exposed  no  2. Patient or partner has history of genital herpes  no 3. Rash or viral illness since LMP  no 4. History of STI (GC, CT, HPV, syphilis, HIV)  no 5. History of recent travel :  no  Other pertinent information:  no     Review of Systems:10 point review of systems negative unless otherwise noted in HPI  Past Medical History:  Past Medical History:  Diagnosis Date  . BRCA negative 04/2018   MyRisk neg; IBIS=8.9%/riskscore=6.7%  .  Family history of ovarian cancer   . Gestational diabetes   . PCOS (polycystic ovarian syndrome)     Past Surgical History:  Past Surgical History:  Procedure Laterality Date  . CESAREAN SECTION N/A 07/15/2014   Procedure: CESAREAN SECTION;  Surgeon: Gae Dry, MD;  Location: [facility] ORS;  Service: Obstetrics;  Laterality: N/A;  . CESAREAN SECTION N/A 02/10/2018   Procedure: CESAREAN SECTION;  Surgeon: Will Bonnet, MD;  Location: [facility] ORS;  Service: Obstetrics;   Laterality: N/A;  . HERNIA REPAIR  10/2013  . WISDOM TOOTH EXTRACTION      Gynecologic History: Patient's last menstrual period was 04/03/2018 (approximate).  Obstetric History: [id number]  Family History:  Family History  Problem Relation Age of Onset  . Ovarian cancer Mother   . Diabetes Paternal Grandfather   . Diabetes Paternal Aunt     Social History:  Social History   Socioeconomic History  . Marital status: Married    Spouse name: Not on file  . Number of children: Not on file  . Years of education: Not on file  . Highest education level: Not on file  Occupational History  . Not on file  Social Needs  . Financial resource strain: Not hard at all  . Food insecurity:    Worry: Never true    Inability: Never true  . Transportation needs:    Medical: No    Non-medical: No  Tobacco Use  . Smoking status: Never Smoker  . Smokeless tobacco: Never Used  Substance and Sexual Activity  . Alcohol use: No  . Drug use: No  . Sexual activity: Yes    Birth control/protection: Other-see comments    Comment: will discuss with MD  Lifestyle  . Physical activity:    Days per week: 0 days    Minutes per session: 0 min  . Stress: To some extent  Relationships  . Social connections:    Talks on phone: More than three times a week    Gets together: Three times a week    Attends religious service: Never    Active member of club or organization: No    Attends meetings of clubs or organizations: Never    Relationship status: Married  . Intimate partner violence:    Fear of current or ex partner: No    Emotionally abused: No    Physically abused: No    Forced sexual activity: No  Other Topics Concern  . Not on file  Social History Narrative  . Not on file    Allergies:  Allergies  Allergen Reactions  . Fish Allergy Shortness Of Breath    Medications: Prior to Admission medications   Medication Sig Start Date End Date Taking? Authorizing Provider  escitalopram  (LEXAPRO) 20 MG tablet Take 1 tablet (20 mg total) by mouth daily. 04/15/18   [name], MD  hydrOXYzine (ATARAX/VISTARIL) 25 MG tablet Take 1 tablet (25 mg total) by mouth every 6 (six) hours as needed for anxiety. 04/15/18   [name], MD  Norethindrone-Ethinyl Estradiol-Fe Biphas (LO LOESTRIN FE) 1 MG-10 MCG / 10 MCG tablet Take 1 tablet by mouth daily. 04/15/18 07/08/18  [name], MD    Physical Exam Vitals: not currently breastfeeding.  No physical exam as this was a remote telephone visit to promote social distancing during the current COVID-19 Pandemic  Assessment: 28 y.o. [id number] at Unknown presenting to initiate prenatal care  Plan: 1) Avoid alcoholic beverages. 2) Patient encouraged not to smoke.  3) Discontinue the use of all non-medicinal drugs and chemicals.  4) Take prenatal vitamins daily.  5) Nutrition, food safety (fish, cheese advisories, and high nitrite foods) and exercise discussed. 6) Hospital and practice style discussed with cross coverage system.  7) 3-hr OGTT with NOB labs 8) Telephone time: 17mn 45 seconds  AMalachy Mood MD, [place] [facility] 06/02/2018, 2:36 PM

## 2018-06-02 NOTE — Progress Notes (Signed)
NOB telephone visit Neurologist visit 3/26 discuss new medications

## 2018-06-15 ENCOUNTER — Other Ambulatory Visit: Payer: BLUE CROSS/BLUE SHIELD

## 2018-06-15 ENCOUNTER — Ambulatory Visit (INDEPENDENT_AMBULATORY_CARE_PROVIDER_SITE_OTHER): Payer: BLUE CROSS/BLUE SHIELD | Admitting: Obstetrics and Gynecology

## 2018-06-15 ENCOUNTER — Other Ambulatory Visit: Payer: Self-pay

## 2018-06-15 VITALS — BP 130/80 | Wt 178.0 lb

## 2018-06-15 DIAGNOSIS — O0990 Supervision of high risk pregnancy, unspecified, unspecified trimester: Secondary | ICD-10-CM

## 2018-06-15 DIAGNOSIS — Z8632 Personal history of gestational diabetes: Secondary | ICD-10-CM | POA: Diagnosis not present

## 2018-06-15 DIAGNOSIS — Z3A09 9 weeks gestation of pregnancy: Secondary | ICD-10-CM

## 2018-06-15 DIAGNOSIS — O99210 Obesity complicating pregnancy, unspecified trimester: Secondary | ICD-10-CM

## 2018-06-15 DIAGNOSIS — O34219 Maternal care for unspecified type scar from previous cesarean delivery: Secondary | ICD-10-CM

## 2018-06-15 LAB — POCT URINALYSIS DIPSTICK OB: POC,PROTEIN,UA: NEGATIVE

## 2018-06-15 LAB — OB RESULTS CONSOLE VARICELLA ZOSTER ANTIBODY, IGG: Varicella: IMMUNE

## 2018-06-15 NOTE — Progress Notes (Signed)
ROB 3hr GTT Dating scan

## 2018-06-15 NOTE — Progress Notes (Signed)
    Routine Prenatal Care Visit  Subjective  Denise Walker is a 28 y.o. [id number] at 30w2dbeing seen today for ongoing prenatal care.  She is currently monitored for the following issues for this high-risk pregnancy and has History of eclampsia; Migraines; History of cesarean delivery; BRCA gene mutation negative; Short interval between pregnancies affecting pregnancy, antepartum; History of gestational diabetes; Maternal obesity, antepartum; and Supervision of high risk pregnancy, antepartum on their problem list.  ----------------------------------------------------------------------------------- Patient reports no complaints.    .  .   . Denies leaking of fluid.  ----------------------------------------------------------------------------------- The following portions of the patient's history were reviewed and updated as appropriate: allergies, current medications, past family history, past medical history, past social history, past surgical history and problem list. Problem list updated.   Objective  Blood pressure 130/80, weight 178 lb (80.7 kg), last menstrual period 04/03/2018, not currently breastfeeding. Pregravid weight Pregravid weight not on file Total Weight Gain Not found. Urinalysis:      Fetal Status:           General:  Alert, oriented and cooperative. Patient is in no acute distress.  Skin: Skin is warm and dry. No rash noted.   Cardiovascular: Normal heart rate noted  Respiratory: Normal respiratory effort, no problems with respiration noted  Abdomen: Soft, gravid, appropriate for gestational age.       Pelvic:  Cervical exam deferred        Extremities: Normal range of motion.     ental Status: Normal Walker and affect. Normal behavior. Normal judgment and thought content.   Beside Ultrasound - singleton viable IUP, FHT 162 BPM, CRL 2.55cm c/w 959w2destation and EDD of 01/16/2019  Assessment   [age].o. [id number] 9w48w2d  01/16/2019, by Ultrasound presenting for routine  prenatal visit  Plan    Gestational age appropriate obstetric precautions including but not limited to vaginal bleeding, contractions, leaking of fluid and fetal movement were reviewed in detail with the patient.    -3hr and ROB labs today -Genetic in 2 weeks given change in dating  Return in about 2 weeks (around 06/29/2018) for ROB in person.  AndMalachy MoodD, FACWinslow/GYN, [place]4/2020, 10:47 AM

## 2018-06-16 LAB — RPR+RH+ABO+RUB AB+AB SCR+CB...
HIV Screen 4th Generation wRfx: NONREACTIVE
Hematocrit: 38.2 % (ref 34.0–46.6)
Hemoglobin: 13 g/dL (ref 11.1–15.9)
Hepatitis B Surface Ag: NEGATIVE
MCH: 31 pg (ref 26.6–33.0)
MCHC: 34 g/dL (ref 31.5–35.7)
MCV: 91 fL (ref 79–97)
Platelets: 232 10*3/uL (ref 150–450)
RBC: 4.19 x10E6/uL (ref 3.77–5.28)
RDW: 13.5 % (ref 11.7–15.4)
RPR Ser Ql: NONREACTIVE
Rh Factor: NEGATIVE
Rubella Antibodies, IGG: 14.9 index (ref >0.99)
Varicella zoster IgG: 331 index (ref >165)
WBC: 9 10*3/uL (ref 3.4–10.8)

## 2018-06-16 LAB — AB SCR+ANTIBODY ID: Antibody Screen: POSITIVE — AB

## 2018-06-16 LAB — GESTATIONAL GLUCOSE TOLERANCE
Glucose, Fasting: 90 mg/dL (ref 65–94)
Glucose, GTT - 1 Hour: 164 mg/dL (ref 65–179)
Glucose, GTT - 2 Hour: 108 mg/dL (ref 65–154)
Glucose, GTT - 3 Hour: 74 mg/dL (ref 65–139)

## 2018-06-17 ENCOUNTER — Other Ambulatory Visit: Payer: Self-pay | Admitting: Obstetrics and Gynecology

## 2018-06-17 MED ORDER — GLUCOSE BLOOD VI STRP: ORAL_STRIP | 12 refills | Status: DC

## 2018-06-17 MED ORDER — PROCHLORPERAZINE MALEATE 10 MG PO TABS: 10.0000 mg | ORAL_TABLET | Freq: Four times a day (QID) | ORAL | 3 refills | Status: DC | PRN

## 2018-06-17 MED ORDER — ACCU-CHEK FASTCLIX LANCETS MISC: 1.0000 [IU] | Freq: Four times a day (QID) | 12 refills | Status: DC

## 2018-07-02 ENCOUNTER — Ambulatory Visit (INDEPENDENT_AMBULATORY_CARE_PROVIDER_SITE_OTHER): Payer: BLUE CROSS/BLUE SHIELD | Admitting: Obstetrics and Gynecology

## 2018-07-02 ENCOUNTER — Other Ambulatory Visit: Payer: Self-pay

## 2018-07-02 VITALS — BP 116/74 | Wt 174.0 lb

## 2018-07-02 DIAGNOSIS — Z98891 History of uterine scar from previous surgery: Secondary | ICD-10-CM

## 2018-07-02 DIAGNOSIS — Z1379 Encounter for other screening for genetic and chromosomal anomalies: Secondary | ICD-10-CM | POA: Diagnosis not present

## 2018-07-02 DIAGNOSIS — O0990 Supervision of high risk pregnancy, unspecified, unspecified trimester: Secondary | ICD-10-CM

## 2018-07-02 DIAGNOSIS — O99211 Obesity complicating pregnancy, first trimester: Secondary | ICD-10-CM

## 2018-07-02 DIAGNOSIS — O34219 Maternal care for unspecified type scar from previous cesarean delivery: Secondary | ICD-10-CM

## 2018-07-02 DIAGNOSIS — Z3A11 11 weeks gestation of pregnancy: Secondary | ICD-10-CM

## 2018-07-02 DIAGNOSIS — O09899 Supervision of other high risk pregnancies, unspecified trimester: Secondary | ICD-10-CM

## 2018-07-02 DIAGNOSIS — O99210 Obesity complicating pregnancy, unspecified trimester: Secondary | ICD-10-CM

## 2018-07-02 LAB — POCT URINALYSIS DIPSTICK OB
Glucose, UA: NEGATIVE
POC,PROTEIN,UA: NEGATIVE

## 2018-07-02 NOTE — Progress Notes (Signed)
Routine Prenatal Care Visit  Subjective  Denise Walker is a 28 y.o. [id number] at 39w5dbeing seen today for ongoing prenatal care.  She is currently monitored for the following issues for this high-risk pregnancy and has History of eclampsia; Migraines; History of cesarean delivery; BRCA gene mutation negative; Short interval between pregnancies affecting pregnancy, antepartum; History of gestational diabetes; Maternal obesity, antepartum; and Supervision of high risk pregnancy, antepartum on their problem list.  ----------------------------------------------------------------------------------- Patient reports no complaints.   Contractions: Not present. Vag. Bleeding: None.  Movement: Absent. Denies leaking of fluid.  ----------------------------------------------------------------------------------- The following portions of the patient's history were reviewed and updated as appropriate: allergies, current medications, past family history, past medical history, past social history, past surgical history and problem list. Problem list updated.   Objective  Blood pressure 116/74, weight 174 lb (78.9 kg), last menstrual period 04/03/2018, not currently breastfeeding. Pregravid weight Pregravid weight not on file Total Weight Gain Not found. Urinalysis:      Fetal Status: Fetal Heart Rate (bpm): 180   Movement: Absent     General:  Alert, oriented and cooperative. Patient is in no acute distress.  Skin: Skin is warm and dry. No rash noted.   Cardiovascular: Normal heart rate noted  Respiratory: Normal respiratory effort, no problems with respiration noted  Abdomen: Soft, gravid, appropriate for gestational age. Pain/Pressure: Absent     Pelvic:  Cervical exam deferred        Extremities: Normal range of motion.     ental Status: Normal mood and affect. Normal behavior. Normal judgment and thought content.     Assessment   28y.o. [id number] 133w5dy  01/16/2019, by Ultrasound  presenting for routine prenatal visit  Plan   Pregnancy#6 Problems (from 04/03/18 to present)    Problem Noted Resolved   History of eclampsia 07/22/2017 by StMalachy MoodMD No   Priority:  Medium     Overview Addendum 09/03/2017  5:46 PM by StMalachy MoodMD    [X]  Aspirin 81 mg daily after 12 weeks; discontinue after 36 weeks  Baseline and surveillance labs ([facility])  Lab Results  Component Value Date   PLT 292 07/22/2017   CREATININE 0.73 07/22/2017   AST 16 07/22/2017   ALT 13 07/22/2017   PROTCRRATIO 69 07/16/2014           Short interval between pregnancies affecting pregnancy, antepartum 06/02/2018 by StMalachy MoodMD No   History of gestational diabetes 06/02/2018 by StMalachy MoodMD No   Maternal obesity, antepartum 06/02/2018 by StMalachy MoodMD No   Supervision of high risk pregnancy, antepartum 06/02/2018 by StMalachy MoodMD No   Overview Addendum 07/02/2018  9:28 AM by StMalachy MoodMD    Clinic [facility] Prenatal Labs  Dating 9w75w2d [place] type: --/--/O NEG (12/31 1044)   Genetic Screen NIPS: Inheritest negative Antibody:POS (12/31 1044)  Anatomic US [place]ubella: 0.97 (06/11 1453) Varicella: Immune  GTT Early: 90, 164, 108, 74 64        Third trimester:  RPR: Non Reactive (12/31 1000)   Rhogam  HBsAg: Negative (06/11 1453)   TDaP vaccine                       Flu Shot: HIV: NON REACTIVE (12/31 1000)   Baby Food  ONG:EXBMWUXL (12/26 1715)  Contraception  Pap:  CBB     CS/VBAC    Support Person            History of cesarean delivery 02/10/2018 by Will Bonnet, MD No      Gestational age appropriate obstetric precautions including but not limited to vaginal bleeding, contractions, leaking of fluid and fetal movement were reviewed in detail with the patient.    - Maternity 21 today - repeat T&S  Return in about 2 weeks (around 07/16/2018) for [place].   [name], MD, [facility], [facility] 07/02/2018, 9:28 AM

## 2018-07-02 NOTE — Progress Notes (Signed)
ROB

## 2018-07-03 LAB — AB SCR+ANTIBODY ID: Antibody Screen: POSITIVE — AB

## 2018-07-03 LAB — ANTIBODY SCREEN

## 2018-07-03 LAB — ABO

## 2018-07-07 ENCOUNTER — Other Ambulatory Visit: Payer: Self-pay | Admitting: Obstetrics and Gynecology

## 2018-07-07 DIAGNOSIS — Z3182 Encounter for Rh incompatibility status: Secondary | ICD-10-CM

## 2018-07-07 NOTE — Progress Notes (Signed)
amb  

## 2018-07-08 LAB — MATERNIT 21 PLUS CORE, BLOOD
Fetal Fraction: 6
Result (T21): NEGATIVE
Trisomy 13 (Patau syndrome): NEGATIVE
Trisomy 18 (Edwards syndrome): NEGATIVE
Trisomy 21 (Down syndrome): NEGATIVE

## 2018-07-09 ENCOUNTER — Encounter: Payer: Self-pay | Admitting: Obstetrics and Gynecology

## 2018-07-09 DIAGNOSIS — O360190 Maternal care for anti-D [Rh] antibodies, unspecified trimester, not applicable or unspecified: Secondary | ICD-10-CM

## 2018-07-09 HISTORY — DX: Maternal care for anti-d (rh) antibodies, unspecified trimester, not applicable or unspecified: O36.0190

## 2018-07-15 ENCOUNTER — Ambulatory Visit: Payer: BLUE CROSS/BLUE SHIELD | Admitting: Obstetrics and Gynecology

## 2018-07-16 ENCOUNTER — Ambulatory Visit
Admission: RE | Admit: 2018-07-16 | Discharge: 2018-07-16 | Disposition: A | Discharge status: Home / Self Care | Payer: BLUE CROSS/BLUE SHIELD | Source: Ambulatory Visit | Attending: Maternal & Fetal Medicine | Admitting: Maternal & Fetal Medicine

## 2018-07-16 ENCOUNTER — Other Ambulatory Visit: Payer: Self-pay

## 2018-07-16 ENCOUNTER — Ambulatory Visit (INDEPENDENT_AMBULATORY_CARE_PROVIDER_SITE_OTHER): Payer: BLUE CROSS/BLUE SHIELD | Admitting: Obstetrics and Gynecology

## 2018-07-16 VITALS — BP 116/64 | Wt 179.0 lb

## 2018-07-16 DIAGNOSIS — O26891 Other specified pregnancy related conditions, first trimester: Secondary | ICD-10-CM

## 2018-07-16 DIAGNOSIS — Z6791 Unspecified blood type, Rh negative: Secondary | ICD-10-CM

## 2018-07-16 DIAGNOSIS — O360190 Maternal care for anti-D [Rh] antibodies, unspecified trimester, not applicable or unspecified: Secondary | ICD-10-CM

## 2018-07-16 DIAGNOSIS — Z3A13 13 weeks gestation of pregnancy: Secondary | ICD-10-CM

## 2018-07-16 DIAGNOSIS — O09899 Supervision of other high risk pregnancies, unspecified trimester: Secondary | ICD-10-CM

## 2018-07-16 DIAGNOSIS — O09891 Supervision of other high risk pregnancies, first trimester: Secondary | ICD-10-CM

## 2018-07-16 DIAGNOSIS — O0990 Supervision of high risk pregnancy, unspecified, unspecified trimester: Secondary | ICD-10-CM

## 2018-07-16 DIAGNOSIS — O99210 Obesity complicating pregnancy, unspecified trimester: Secondary | ICD-10-CM

## 2018-07-16 DIAGNOSIS — O34219 Maternal care for unspecified type scar from previous cesarean delivery: Secondary | ICD-10-CM

## 2018-07-16 DIAGNOSIS — O09291 Supervision of pregnancy with other poor reproductive or obstetric history, first trimester: Secondary | ICD-10-CM

## 2018-07-16 DIAGNOSIS — Z8759 Personal history of other complications of pregnancy, childbirth and the puerperium: Secondary | ICD-10-CM

## 2018-07-16 DIAGNOSIS — O26899 Other specified pregnancy related conditions, unspecified trimester: Secondary | ICD-10-CM

## 2018-07-16 DIAGNOSIS — Z98891 History of uterine scar from previous surgery: Secondary | ICD-10-CM

## 2018-07-16 DIAGNOSIS — Z8632 Personal history of gestational diabetes: Secondary | ICD-10-CM

## 2018-07-16 DIAGNOSIS — O99211 Obesity complicating pregnancy, first trimester: Secondary | ICD-10-CM

## 2018-07-16 LAB — POCT URINALYSIS DIPSTICK OB
Glucose, UA: NEGATIVE
POC,PROTEIN,UA: NEGATIVE

## 2018-07-16 NOTE — Progress Notes (Signed)
ROB

## 2018-07-16 NOTE — Progress Notes (Signed)
Routine Prenatal Care Visit  Subjective  Denise Walker is a 28 y.o. [id number] at 48w5dbeing seen today for ongoing prenatal care.  She is currently monitored for the following issues for this high-risk pregnancy and has History of eclampsia; Rh negative state in antepartum period; Migraines; History of cesarean delivery; BRCA gene mutation negative; Short interval between pregnancies affecting pregnancy, antepartum; History of gestational diabetes; Maternal obesity, antepartum; Supervision of high risk pregnancy, antepartum; and Anti-D antibodies present during pregnancy on their problem list.  ----------------------------------------------------------------------------------- Patient reports no complaints.   Contractions: Not present. Vag. Bleeding: None.  Movement: Absent. Denies leaking of fluid.  ----------------------------------------------------------------------------------- The following portions of the patient's history were reviewed and updated as appropriate: allergies, current medications, past family history, past medical history, past social history, past surgical history and problem list. Problem list updated.   Objective  Blood pressure 116/64, weight 179 lb (81.2 kg), last menstrual period 04/03/2018, not currently breastfeeding. Pregravid weight Pregravid weight not on file Total Weight Gain Not found. Urinalysis:      Fetal Status: Fetal Heart Rate (bpm): 149   Movement: Absent     General:  Alert, oriented and cooperative. Patient is in no acute distress.  Skin: Skin is warm and dry. No rash noted.   Cardiovascular: Normal heart rate noted  Respiratory: Normal respiratory effort, no problems with respiration noted  Abdomen: Soft, gravid, appropriate for gestational age. Pain/Pressure: Absent     Pelvic:  Cervical exam deferred        Extremities: Normal range of motion.     ental Status: Normal mood and affect. Normal behavior. Normal judgment and thought content.      Assessment   28y.o. [id number] 135w5dy  01/16/2019, by Ultrasound presenting for routine prenatal visit  Plan   Pregnancy#6 Problems (from 04/03/18 to present)    Problem Noted Resolved   History of eclampsia 07/22/2017 by StMalachy MoodMD No   Priority:  Medium     Overview Addendum 09/03/2017  5:46 PM by StMalachy MoodMD    _0  Aspirin 81 mg daily after 12 weeks; discontinue after 36 weeks  [facility] and [facility]  Lab Results  Component Value Date   PLT 292 07/22/2017   CREATININE 0.73 07/22/2017   AST 16 07/22/2017   ALT 13 07/22/2017   PROTCRRATIO 69 07/16/2014           Anti-D antibodies present during pregnancy 06/17/03/2020y StMalachy MoodMD No   Overview Signed 06/2018-06-050:29 AM by StMalachy MoodMD    Last rhogam from prior pregnancy 02/11/2018 post delivery      Short interval between pregnancies affecting pregnancy, antepartum 06/02/2018 by StMalachy MoodMD No   History of gestational diabetes 06/02/2018 by StMalachy MoodMD No   Maternal obesity, antepartum 06/02/2018 by StMalachy MoodMD No   Supervision of high risk pregnancy, antepartum 06/02/2018 by StMalachy MoodMD No   Overview Addendum 06/2018/06/050:28 AM by StMalachy MoodMD    Clinic [facility] Prenatal Labs  Dating 9w16w2d Koreaood type: --/--/O NEG (12/31 1044)   Genetic Screen NIPS: XX Inheritest negative Antibody:POS (12/31 1044)  Anatomic US [place]ubella: 0.97 (06/11 1453) Varicella: Immune  GTT _1  28 weeks RPR: Non Reactive (12/31 1000)   Rhogam  HBsAg: Negative (06/11 1453)   TDaP vaccine  Flu Shot: HIV: NON REACTIVE (12/31 1000)   Baby Food                                GBS:  Contraception  Pap: 07/22/2017 NIL  CBB     CS/VBAC CS x 2   Support Person Husband Cody           History of cesarean delivery 02/10/2018 by Will Bonnet, MD No   Rh negative state in antepartum  period 08/23/2017 by [name], MD No       Gestational age appropriate obstetric precautions including but not limited to vaginal bleeding, contractions, leaking of fluid and fetal movement were reviewed in detail with the patient.    - Had DP follow up later today  Return in about 2 weeks (around 07/30/2018) for ROB (Zakara Parkey).  [name], MD, [facility], [facility] 07/16/2018, 1:25 PM

## 2018-07-16 NOTE — Progress Notes (Signed)
[facility] Consultation     HPI: Denise Walker is a 28 y.o. 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  Her history is also significant for hypertensive disorder of pregnancy (HDP) with eclampsia in first pregnancy and postpartum preeclampsia in her last pregnancy.  She also has a history of migraines that worsen during pregnancy and typically improve after 20 weeks.  Her history is also significant for PCOS and infertility.  She conceived the current pregnancy spontaneously.   Past Medical History: Patient  has a past medical history of BRCA negative (04/2018), Family history of ovarian cancer, Gestational diabetes, and PCOS (polycystic ovarian syndrome).  Past Surgical History: She  has a past surgical history that includes Hernia repair (10/2013); Wisdom tooth extraction; Cesarean section (N/A, 07/15/2014); and Cesarean section (N/A, 02/10/2018).  Obstetric History:  OB History    Gravida  6   Para  2   Term  2   Preterm      AB  3   Living  2     SAB  3   TAB      Ectopic      Multiple  0   Live Births  2         2016, c/s, "gracelynn" 6 lab 4 oz, 40 weeks, eclampsia--in setting of IOL and fetal intolerance Pregnancy also complicated by GDM A2 and worsening glycemic control during pregnancy 2019, repeat c/s 6lb 8 oz,  37 weeks, postpartum preeclampsia (did not require admission) Gynecologic History:  Patient's last menstrual period was 04/03/2018 (approximate). She reports rare menses due to PCOS  Current Outpatient Medications on File Prior to Encounter  Medication Sig Dispense Refill  . Accu-Chek FastClix Lancets MISC 1 Units by Percutaneous route 4 (four) times daily. 100 each 12  . escitalopram (LEXAPRO) 20 MG tablet Take 1 tablet (20 mg total) by mouth daily. 90 tablet 3  . glucose blood (ACCU-CHEK GUIDE) test strip  Use as instructed 100 each 12  . butalbital-acetaminophen-caffeine (FIORICET) 50-325-40 MG tablet TAKE 1 TABLET BY MOUTH EVERY 4 (FOUR) HOURS AS NEEDED FOR PAIN FOR UP TO 10 DAYS.    . hydrOXYzine (ATARAX/VISTARIL) 25 MG tablet Take 1 tablet (25 mg total) by mouth every 6 (six) hours as needed for anxiety. 30 tablet 2  . ondansetron (ZOFRAN) 4 MG tablet TAKE 1 TABLET BY MOUTH EVERY 8 HOURS AS NEEDED FOR NAUSEA FOR UP TO 7 DAYS    . prochlorperazine (COMPAZINE) 10 MG tablet Take 1 tablet (10 mg total) by mouth every 6 (six) hours as needed for nausea (headache). 40 tablet 3  . promethazine (PHENERGAN) 12.5 MG tablet TAKE 1 TABLET (12.5 MG TOTAL) BY MOUTH EVERY 6 (SIX) HOURS AS NEEDED FOR NAUSEA FOR UP TO 7 DAYS     No current facility-administered medications on file prior to encounter.    Allergies  Allergen Reactions  . Fish Allergy Shortness Of Breath   Allergies: Patient is allergic to fish allergy.  Social History: Patient  reports that she has never smoked. She has never used smokeless tobacco. She reports that she does not drink alcohol or use drugs.  Family History: family history includes Arthritis in her mother; Breast cancer in her maternal grandmother; Diabetes in her paternal aunt and paternal grandfather; Ovarian cancer in her maternal aunt and mother; Uterine cancer in her cousin.  Review of Systems  A full 12 point review of systems was negative or as noted in the History of Present Illness.  Physical Exam: BP 109/65 (BP Location: Right Arm)   Pulse 88   Temp 98.4 F (36.9 C) (Oral)   Resp 18   Wt 81.8 kg   LMP 04/03/2018 (Approximate)   BMI 36.44 kg/m    Asessement: 1. 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 We addressed possible risk for severe fetal anemia in the setting of critical antibody titers that bind to the blood.  The presence of a critical titer (1:16) would prompt testing for fetal anemia with MCA dopplers. If her husband is heterozygous (both of her  girls are Rh positive) we could consider amniocentesis to check the fetal status.  If the fetus is RH negative, the fetus would not be at risk for severe, hemolytic anemia.  In the setting of a critical titer and elevated MCA dopplers, we would offer percutaneous umbilical vein transfusion (pubs) and fetal transfusion.  --recommend repeat antibody titers in ~4 weeks and follow up monthly (if normal) --fetal detailed anatomic survey and follow up MFM consult in 4 weeks --if critical titre reached, MCA dopplers can be initiated (can obtain paternal genotype and consider amniocentesis at that time. If dad is +/+, amniocentesis will not be indicated as fetus will be at risk, however if paternal genotype +/- amniocentesis may be of benefit to determine whether fetus is at risk)  2. Obesity/PCOS She is very acquainted with risks associated with elevated BMI and PCOS. She had bariatric follow up postpartum to help with weight management after last pregnancy.  --we addressed risks for preeclampsia, GDM and long term risks for maternal CVD associated with prior history of hypertensive disorder of pregnancy.  We discussed need for ongoing, long-term surveillance for CVD as well as continuation of healthy behaviors to decrease risk.  --she plans to begin daily baby aspirin today for preelcampsia risk reduction --detailed anatomic survey, serial growth scans monthly beginning ~28 weeks --recommend delivery in 39th week, unless indicated sooner --baseline cbc, tsh, cmp, p:c ratio (if not already performed).  She reports negative early GDM screening.  --she has a home bp cuff and self monitors   3. Migraines --She reports migraines worsen during first part of pregnancy and typically improve after 20 weeks.  Currently without sxs. --We would recommend prophylaxis with Magensium 400-8136m/day, riboflavin 40736mday and melatonin 36m58mS. --we would recommend escalating treatment with 1. tylenol 1000m60mD prn  followed by 2. metoclopromide 10mg66m prn followed by 3. sumatriptan 50mg 36m   4. Previous c/s--planning repeat.    Total time spent with the patient was 30 minutes with greater than 50% spent in counseling and coordination of care. We appreciate this interesting consult and will be happy to be involved in the ongoing care of Ms. CurtisPohlmanyway her obstetricians desire.  Lasonya Hubner [facility]

## 2018-07-31 ENCOUNTER — Ambulatory Visit (INDEPENDENT_AMBULATORY_CARE_PROVIDER_SITE_OTHER): Payer: BC Managed Care – PPO | Admitting: Obstetrics and Gynecology

## 2018-07-31 ENCOUNTER — Other Ambulatory Visit: Payer: Self-pay

## 2018-07-31 VITALS — BP 112/76 | Wt 179.0 lb

## 2018-07-31 DIAGNOSIS — O09899 Supervision of other high risk pregnancies, unspecified trimester: Secondary | ICD-10-CM | POA: Diagnosis not present

## 2018-07-31 DIAGNOSIS — O360190 Maternal care for anti-D [Rh] antibodies, unspecified trimester, not applicable or unspecified: Secondary | ICD-10-CM

## 2018-07-31 DIAGNOSIS — O99212 Obesity complicating pregnancy, second trimester: Secondary | ICD-10-CM

## 2018-07-31 DIAGNOSIS — Z1371 Encounter for nonprocreative screening for genetic disease carrier status: Secondary | ICD-10-CM

## 2018-07-31 DIAGNOSIS — O0992 Supervision of high risk pregnancy, unspecified, second trimester: Secondary | ICD-10-CM

## 2018-07-31 DIAGNOSIS — O34219 Maternal care for unspecified type scar from previous cesarean delivery: Secondary | ICD-10-CM

## 2018-07-31 DIAGNOSIS — Z6791 Unspecified blood type, Rh negative: Secondary | ICD-10-CM

## 2018-07-31 DIAGNOSIS — O26899 Other specified pregnancy related conditions, unspecified trimester: Secondary | ICD-10-CM | POA: Diagnosis not present

## 2018-07-31 DIAGNOSIS — O0990 Supervision of high risk pregnancy, unspecified, unspecified trimester: Secondary | ICD-10-CM

## 2018-07-31 DIAGNOSIS — O26892 Other specified pregnancy related conditions, second trimester: Secondary | ICD-10-CM

## 2018-07-31 DIAGNOSIS — Z8632 Personal history of gestational diabetes: Secondary | ICD-10-CM

## 2018-07-31 DIAGNOSIS — Z98891 History of uterine scar from previous surgery: Secondary | ICD-10-CM

## 2018-07-31 DIAGNOSIS — O99210 Obesity complicating pregnancy, unspecified trimester: Secondary | ICD-10-CM

## 2018-07-31 DIAGNOSIS — Z3A15 15 weeks gestation of pregnancy: Secondary | ICD-10-CM

## 2018-07-31 DIAGNOSIS — O09292 Supervision of pregnancy with other poor reproductive or obstetric history, second trimester: Secondary | ICD-10-CM

## 2018-07-31 DIAGNOSIS — Z8759 Personal history of other complications of pregnancy, childbirth and the puerperium: Secondary | ICD-10-CM

## 2018-07-31 LAB — POCT URINALYSIS DIPSTICK OB: POC,PROTEIN,UA: NEGATIVE

## 2018-07-31 NOTE — Progress Notes (Signed)
Routine Prenatal Care Visit  Subjective  Denise Walker is a 28 y.o. [id number] at 73w6dbeing seen today for ongoing prenatal care.  She is currently monitored for the following issues for this high-risk pregnancy and has History of eclampsia; Rh negative state in antepartum period; Migraines; History of cesarean delivery; BRCA gene mutation negative; Short interval between pregnancies affecting pregnancy, antepartum; History of gestational diabetes; Maternal obesity, antepartum; Supervision of high risk pregnancy, antepartum; and Anti-D antibodies present during pregnancy on their problem list.  ----------------------------------------------------------------------------------- Patient reports occasional groin pain, positional.   Contractions: Not present. Vag. Bleeding: None.  Movement: Absent. Denies leaking of fluid.  ----------------------------------------------------------------------------------- The following portions of the patient's history were reviewed and updated as appropriate: allergies, current medications, past family history, past medical history, past social history, past surgical history and problem list. Problem list updated.   Objective  Blood pressure 112/76, weight 179 lb (81.2 kg), last menstrual period 04/03/2018, not currently breastfeeding. Pregravid weight Pregravid weight not on file Total Weight Gain Not found. Urinalysis:      Fetal Status: Fetal Heart Rate (bpm): 144   Movement: Absent     General:  Alert, oriented and cooperative. Patient is in no acute distress.  Skin: Skin is warm and dry. No rash noted.   Cardiovascular: Normal heart rate noted  Respiratory: Normal respiratory effort, no problems with respiration noted  Abdomen: Soft, gravid, appropriate for gestational age. Pain/Pressure: Absent     Pelvic:  Cervical exam deferred        Extremities: Normal range of motion.     ental Status: Normal mood and affect. Normal behavior. Normal judgment  and thought content.     Assessment   28y.o. [id number] 168w6dy  01/16/2019, by Ultrasound presenting for routine prenatal visit  Plan   Pregnancy#6 Problems (from 04/03/18 to present)    Problem Noted Resolved   History of eclampsia 07/22/2017 by StMalachy MoodMD No   Priority:  Medium     Overview Addendum 09/03/2017  5:46 PM by StMalachy MoodMD    [X]  Aspirin 81 mg daily after 12 weeks; discontinue after 36 weeks  [facility] and [facility]  Lab Results  Component Value Date   PLT 292 07/22/2017   CREATININE 0.73 07/22/2017   AST 16 07/22/2017   ALT 13 07/22/2017   PROTCRRATIO 69 07/16/2014           Anti-D antibodies present during pregnancy 06/16/00/20y StMalachy MoodMD No   Overview Signed 5/02-Jun-20200:29 AM by StMalachy MoodMD    Last rhogam from prior pregnancy 02/11/2018 post delivery      Short interval between pregnancies affecting pregnancy, antepartum 06/02/2018 by StMalachy MoodMD No   History of gestational diabetes 06/02/2018 by StMalachy MoodMD No   Maternal obesity, antepartum 06/02/2018 by StMalachy MoodMD No   Supervision of high risk pregnancy, antepartum 06/02/2018 by StMalachy MoodMD No   Overview Addendum 5/06-02-20200:28 AM by StMalachy MoodMD    [facility]inic [facility] Prenatal Labs  Dating 9w11w2d Koreaood type: --/--/O NEG (12/31 1044)   Genetic Screen NIPS: XX Inheritest negative Antibody:POS (12/31 1044)  Anatomic US Koreaubella: 0.97 (06/11 1453) Varicella: Immune  GTT [ ]  28 weeks RPR: Non Reactive (12/31 1000)   Rhogam  HBsAg: Negative (06/11 1453)   TDaP vaccine  Flu Shot: HIV: NON REACTIVE (12/31 1000)   Baby Food                                GBS:  Contraception  Pap: 07/22/2017 NIL  CBB     CS/VBAC CS x 2   Support Person Husband Cody           History of cesarean delivery 02/10/2018 by Will Bonnet, MD No   Rh negative  state in antepartum period 08/23/2017 by [name], MD No       Gestational age appropriate obstetric precautions including but not limited to vaginal bleeding, contractions, leaking of fluid and fetal movement were reviewed in detail with the patient.    Repeat antibody screen today  Return in about 2 weeks (around 08/14/2018) for ROB.  [name], MD, [facility], [facility] 07/31/2018, 11:33 AM

## 2018-07-31 NOTE — Progress Notes (Signed)
ROB Weird pain when in groin area

## 2018-08-04 LAB — AB SCR+ANTIBODY ID: Antibody Screen: POSITIVE — AB

## 2018-08-04 LAB — ANTIBODY SCREEN

## 2018-08-10 ENCOUNTER — Other Ambulatory Visit: Payer: Self-pay

## 2018-08-10 DIAGNOSIS — M5387 Other specified dorsopathies, lumbosacral region: Secondary | ICD-10-CM | POA: Diagnosis not present

## 2018-08-10 DIAGNOSIS — M9904 Segmental and somatic dysfunction of sacral region: Secondary | ICD-10-CM | POA: Diagnosis not present

## 2018-08-10 DIAGNOSIS — M9905 Segmental and somatic dysfunction of pelvic region: Secondary | ICD-10-CM | POA: Diagnosis not present

## 2018-08-10 DIAGNOSIS — M9903 Segmental and somatic dysfunction of lumbar region: Secondary | ICD-10-CM | POA: Diagnosis not present

## 2018-08-10 DIAGNOSIS — O0990 Supervision of high risk pregnancy, unspecified, unspecified trimester: Secondary | ICD-10-CM

## 2018-08-12 ENCOUNTER — Ambulatory Visit (INDEPENDENT_AMBULATORY_CARE_PROVIDER_SITE_OTHER): Payer: BC Managed Care – PPO | Admitting: Obstetrics and Gynecology

## 2018-08-12 ENCOUNTER — Other Ambulatory Visit: Payer: Self-pay

## 2018-08-12 VITALS — BP 120/80 | Wt 180.0 lb

## 2018-08-12 DIAGNOSIS — O26892 Other specified pregnancy related conditions, second trimester: Secondary | ICD-10-CM

## 2018-08-12 DIAGNOSIS — O09292 Supervision of pregnancy with other poor reproductive or obstetric history, second trimester: Secondary | ICD-10-CM

## 2018-08-12 DIAGNOSIS — O09892 Supervision of other high risk pregnancies, second trimester: Secondary | ICD-10-CM

## 2018-08-12 DIAGNOSIS — O99212 Obesity complicating pregnancy, second trimester: Secondary | ICD-10-CM

## 2018-08-12 DIAGNOSIS — O0990 Supervision of high risk pregnancy, unspecified, unspecified trimester: Secondary | ICD-10-CM

## 2018-08-12 DIAGNOSIS — Z8632 Personal history of gestational diabetes: Secondary | ICD-10-CM

## 2018-08-12 DIAGNOSIS — Z6791 Unspecified blood type, Rh negative: Secondary | ICD-10-CM

## 2018-08-12 DIAGNOSIS — O09899 Supervision of other high risk pregnancies, unspecified trimester: Secondary | ICD-10-CM

## 2018-08-12 DIAGNOSIS — Z3A17 17 weeks gestation of pregnancy: Secondary | ICD-10-CM

## 2018-08-12 DIAGNOSIS — Z8759 Personal history of other complications of pregnancy, childbirth and the puerperium: Secondary | ICD-10-CM

## 2018-08-12 DIAGNOSIS — Z98891 History of uterine scar from previous surgery: Secondary | ICD-10-CM

## 2018-08-12 DIAGNOSIS — O34219 Maternal care for unspecified type scar from previous cesarean delivery: Secondary | ICD-10-CM

## 2018-08-12 DIAGNOSIS — O26899 Other specified pregnancy related conditions, unspecified trimester: Secondary | ICD-10-CM

## 2018-08-12 DIAGNOSIS — O360190 Maternal care for anti-D [Rh] antibodies, unspecified trimester, not applicable or unspecified: Secondary | ICD-10-CM

## 2018-08-12 DIAGNOSIS — O360130 Maternal care for anti-D [Rh] antibodies, third trimester, not applicable or unspecified: Secondary | ICD-10-CM

## 2018-08-12 DIAGNOSIS — O99210 Obesity complicating pregnancy, unspecified trimester: Secondary | ICD-10-CM

## 2018-08-12 LAB — POCT URINALYSIS DIPSTICK OB
Glucose, UA: NEGATIVE
POC,PROTEIN,UA: NEGATIVE

## 2018-08-12 NOTE — Addendum Note (Signed)
Addended by: Quintella Baton D on: 08/12/2018 03:26 PM   Modules accepted: Orders

## 2018-08-12 NOTE — Progress Notes (Signed)
Routine Prenatal Care Visit  Subjective  Denise Walker is a 28 y.o. [id number] at 61w4dbeing seen today for ongoing prenatal care.  She is currently monitored for the following issues for this high-risk pregnancy and has History of eclampsia; Rh negative state in antepartum period; Migraines; History of cesarean delivery; BRCA gene mutation negative; Short interval between pregnancies affecting pregnancy, antepartum; History of gestational diabetes; Maternal obesity, antepartum; Supervision of high risk pregnancy, antepartum; and Anti-D antibodies present during pregnancy on their problem list.  ----------------------------------------------------------------------------------- Patient reports no complaints.   Contractions: Not present. Vag. Bleeding: None.  Movement: Absent. Denies leaking of fluid.  ----------------------------------------------------------------------------------- The following portions of the patient's history were reviewed and updated as appropriate: allergies, current medications, past family history, past medical history, past social history, past surgical history and problem list. Problem list updated.   Objective  Blood pressure 120/80, weight 180 lb (81.6 kg), last menstrual period 04/03/2018, not currently breastfeeding. Pregravid weight Pregravid weight not on file Total Weight Gain Not found. Urinalysis:      Fetal Status: Fetal Heart Rate (bpm): 150   Movement: Absent     General:  Alert, oriented and cooperative. Patient is in no acute distress.  Skin: Skin is warm and dry. No rash noted.   Cardiovascular: Normal heart rate noted  Respiratory: Normal respiratory effort, no problems with respiration noted  Abdomen: Soft, gravid, appropriate for gestational age. Pain/Pressure: Present     Pelvic:  Cervical exam deferred        Extremities: Normal range of motion.     ental Status: Normal mood and affect. Normal behavior. Normal judgment and thought content.      Assessment   28y.o. [id number] 161w4dy  01/16/2019, by Ultrasound presenting for routine prenatal visit  Plan   Pregnancy#6 Problems (from 04/03/18 to present)    Problem Noted Resolved   History of eclampsia 07/22/2017 by StMalachy MoodMD No   Priority:  Medium     Overview Addendum 09/03/2017  5:46 PM by StMalachy MoodMD    [X]  Aspirin 81 mg daily after 12 weeks; discontinue after 36 weeks  [facility] and surveillance [facility]  Lab Results  Component Value Date   PLT 292 07/22/2017   CREATININE 0.73 07/22/2017   AST 16 07/22/2017   ALT 13 07/22/2017   PROTCRRATIO 69 07/16/2014           Anti-D antibodies present during pregnancy 5/May 31, 2020y StMalachy MoodMD No   Overview Signed 06/2018/05/310:29 AM by StMalachy MoodMD    Last rhogam from prior pregnancy 02/11/2018 post delivery      Short interval between pregnancies affecting pregnancy, antepartum 06/02/2018 by StMalachy MoodMD No   History of gestational diabetes 06/02/2018 by StMalachy MoodMD No   Maternal obesity, antepartum 06/02/2018 by StMalachy MoodMD No   Supervision of high risk pregnancy, antepartum 06/02/2018 by StMalachy MoodMD No   Overview Addendum 06/15/29/200:28 AM by StMalachy MoodMD    [facility]inic [facility] Prenatal Labs  Dating 9w58w2d Koreaood type: --/--/O NEG (12/31 1044)   Genetic Screen NIPS: XX Inheritest negative Antibody:POS (12/31 1044)  Anatomic US [place]ubella: 0.97 (06/11 1453) Varicella: Immune  GTT [ ]  28 weeks RPR: Non Reactive (12/31 1000)   Rhogam  HBsAg: Negative (06/11 1453)   TDaP vaccine  Flu Shot: HIV: NON REACTIVE (12/31 1000)   Baby Food                                GBS:  Contraception  Pap: 07/22/2017 NIL  CBB     CS/VBAC CS x 2   Support Person Husband Cody           History of cesarean delivery 02/10/2018 by Will Bonnet, MD No   Rh negative state in antepartum  period 08/23/2017 by [name], MD No       Gestational age appropriate obstetric precautions including but not limited to vaginal bleeding, contractions, leaking of fluid and fetal movement were reviewed in detail with the patient.     Return in about 2 weeks (around 08/26/2018) for ROB and anatomy scan.  [name], MD, [facility], [facility] 08/12/2018, 3:21 PM

## 2018-08-13 ENCOUNTER — Ambulatory Visit
Admission: RE | Admit: 2018-08-13 | Discharge: 2018-08-13 | Disposition: A | Discharge status: Home / Self Care | Payer: Self-pay | Source: Ambulatory Visit | Attending: Obstetrics and Gynecology | Admitting: Obstetrics and Gynecology

## 2018-08-13 ENCOUNTER — Ambulatory Visit (HOSPITAL_BASED_OUTPATIENT_CLINIC_OR_DEPARTMENT_OTHER)
Admission: RE | Admit: 2018-08-13 | Discharge: 2018-08-13 | Disposition: A | Discharge status: Home / Self Care | Payer: Self-pay | Source: Ambulatory Visit | Attending: Obstetrics and Gynecology | Admitting: Obstetrics and Gynecology

## 2018-08-13 DIAGNOSIS — O99210 Obesity complicating pregnancy, unspecified trimester: Secondary | ICD-10-CM

## 2018-08-13 DIAGNOSIS — E669 Obesity, unspecified: Secondary | ICD-10-CM | POA: Insufficient documentation

## 2018-08-13 DIAGNOSIS — O99212 Obesity complicating pregnancy, second trimester: Secondary | ICD-10-CM | POA: Insufficient documentation

## 2018-08-13 DIAGNOSIS — F329 Major depressive disorder, single episode, unspecified: Secondary | ICD-10-CM | POA: Insufficient documentation

## 2018-08-13 DIAGNOSIS — O34211 Maternal care for low transverse scar from previous cesarean delivery: Secondary | ICD-10-CM | POA: Insufficient documentation

## 2018-08-13 DIAGNOSIS — Z8759 Personal history of other complications of pregnancy, childbirth and the puerperium: Secondary | ICD-10-CM

## 2018-08-13 DIAGNOSIS — O26899 Other specified pregnancy related conditions, unspecified trimester: Secondary | ICD-10-CM

## 2018-08-13 DIAGNOSIS — O99352 Diseases of the nervous system complicating pregnancy, second trimester: Secondary | ICD-10-CM | POA: Insufficient documentation

## 2018-08-13 DIAGNOSIS — O0990 Supervision of high risk pregnancy, unspecified, unspecified trimester: Secondary | ICD-10-CM | POA: Insufficient documentation

## 2018-08-13 DIAGNOSIS — O09899 Supervision of other high risk pregnancies, unspecified trimester: Secondary | ICD-10-CM

## 2018-08-13 DIAGNOSIS — Z3A17 17 weeks gestation of pregnancy: Secondary | ICD-10-CM | POA: Insufficient documentation

## 2018-08-13 DIAGNOSIS — Z98891 History of uterine scar from previous surgery: Secondary | ICD-10-CM

## 2018-08-13 DIAGNOSIS — O360190 Maternal care for anti-D [Rh] antibodies, unspecified trimester, not applicable or unspecified: Secondary | ICD-10-CM

## 2018-08-13 DIAGNOSIS — Z6791 Unspecified blood type, Rh negative: Secondary | ICD-10-CM

## 2018-08-13 DIAGNOSIS — F419 Anxiety disorder, unspecified: Secondary | ICD-10-CM | POA: Insufficient documentation

## 2018-08-13 DIAGNOSIS — Z8632 Personal history of gestational diabetes: Secondary | ICD-10-CM

## 2018-08-13 DIAGNOSIS — Z7982 Long term (current) use of aspirin: Secondary | ICD-10-CM | POA: Insufficient documentation

## 2018-08-13 DIAGNOSIS — Z79899 Other long term (current) drug therapy: Secondary | ICD-10-CM | POA: Insufficient documentation

## 2018-08-13 DIAGNOSIS — O99342 Other mental disorders complicating pregnancy, second trimester: Secondary | ICD-10-CM | POA: Insufficient documentation

## 2018-08-13 DIAGNOSIS — G43909 Migraine, unspecified, not intractable, without status migrainosus: Secondary | ICD-10-CM | POA: Insufficient documentation

## 2018-08-13 HISTORY — DX: Migraine, unspecified, not intractable, without status migrainosus: G43.909

## 2018-08-13 HISTORY — DX: Anxiety disorder, unspecified: F41.9

## 2018-08-13 NOTE — Progress Notes (Addendum)
[facility] Consultation   Chief Complaint: Positive anti-D antibodies  HPI: Denise Walker is a 28 y.o. J1H4174 at 58w5dby 950w2deside ultrasound [place] [facility] (identified as Anti-D antibodies).  Past Medical History: Patient  has a past medical history of BRCA negative (04/2018), History of gestational diabetes, Obesity (BMI 36), Rh negative, anxiety/depression, migraines Past Surgical History: She  has a past surgical history that includes Hernia repair (10/2013); Wisdom tooth extraction; Cesarean section (N/A, 07/15/2014); and Cesarean section (N/A, 02/10/2018).  Obstetric History:  OB History    Gravida  6   Para  2   Term  2   Preterm      AB  3   Living  2     SAB  3   TAB      Ectopic      Multiple  0   Live Births  2         2013:  SAB 2015:  SAB 2016:  LTCS (documented as low vertical in pregnancy snapshot was on review of op note, was low transverse) 4038w0dclampsia, 6#2oz. 2018:  SAB 2019:  Repeat LTCS at 37w35w2d to poorly controlled GDM, 6#8o0#8XKmplicated by PP preeclampsia 2020:  Current  FOB  Is same for all pregnancies; both her daughters are Rh+.  Gynecologic History:  Patient's last menstrual period was 04/03/2018 (approximate).   Hx of abnormal pap smears: no Last pap smear was normal June 2019  Medications: PNVs, ASA (81 mg), Lexapro, Fioricet/compazine PRN, Flonase PRN Allergies: Patient is allergic to fish.  Social History: Patient  reports that she has never smoked. She has never used smokeless tobacco. She reports that she does not drink alcohol or use drugs.  Family History: family history includes Arthritis in her mother; Breast cancer in her maternal grandmother; Diabetes in her paternal aunt and paternal grandfather; Ovarian cancer in her maternal aunt and mother; Uterine cancer in her cousin.  Review of Systems A full 12 point review  of systems was negative or as noted in the History of Present Illness.  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  Pregnancy complicated by: 1.  Obesity (BMI 36) 2.  History of GDM on insulin 3.  2 prior c/sections 4.  History of eclampsia (postpartum) 5.  Positive antibody screen 6.  Migraine HAs 7.  Anxiety/depression  Plan: 1.  Obesity - recommend limiting weight gain to 10-20 pounds, consider nutritional consult, baseline preX labs (CMP, p/c ratio and TSH) 2.  History of GDM - Early 3hr GTT was normal, repeat at 28 weeks (do not see abnormal glucola, only 3hr GTT). 3.  2 prior c/sections (first was after failed induction, 2nd was repeat), planning on repeat at this time 4.  History of eclampsia (postpartum) - recommend baseline preX labs (CMP, p/c ratio).  Recommend starting '81mg'$  ASA daily until delivery (no need to stop prior to delivery or c/section) 5.  Antibody screen positive (anti-D antibody); too weak to titer -  It is possible this is still positive given last Rhogam dose  on 02/11/2018 - rarely does the antibody screen remain positive up to 180 days since last dose.  Her last negative antibody screen was June 2019 so if she is sensitized, it is just since her last delivery.  EBL at delivery was documented as 900cc (not excessive).  - Given that the titer is too weak to titer, recommend monthly antibody screens.  If antibody screen reverts to negative, will need Rhogam at 28 weeks.  If it remains positive, Rhogam is no longer indicated.   - Will not need fetal monitoring with MCA Dopplers unless titer is > 1:16.  - If  antibody screen remains positive or if titers are noted to increase, will need to discuss paternity (asked while alone today and was sure of paternity) and consider paternal Rh(D) status and if positive, consider zygosity testing versus MCA Doppler screening.  Although need for in utero intervention with PUBS/IUT is unlikely in a first sensitized pregnancy, we discussed that subsequent pregnancies have an increased risk for requiring intervention and each pregnancy after that may be affected at an earlier gestational age. 6.  Migraine HAs - reports that they have been improving since her pregnancy.  Sees a neurologist outside of pregnancy. 7.  Anxiety/depression - stable on lexapro.  We discussed that women who are depressed or have untreated anxiety are more likely to have growth restricted infants, preterm labor, and failure of infant bonding. The use of lexapro in pregnancy is not expected to increase the risk of congenital anomalies.  The use of SSRI medications in late pregnancy is associated with a transient neonatal withdrawal syndrome.  In most cases, symptoms are mild and resolve within 2 weeks. There are rare reports of increased risk of pulmonary hypertension or increased rates of admission to the NICU. The incidence and severity of these effects are not significant enough to warrant avoiding these medications if they are otherwise indicated.  Neurodevelopmental studies do not suggest any long-term adverse effect.  Inadequate treatment of depression, particularly near delivery and in the postpartum period, may pose a greater risk to the child and the mother than the minor effects seen in reported studies.  Denise Walker had an opportunity to ask questions and has follow up scheduled with [facility].   Total time spent with the patient was 30 minutes with greater than 50% spent in counseling and coordination of care.  We appreciate this interesting consult and will be happy to be involved in the  ongoing care of Denise Walker in anyway her obstetricians desire.  Wynona Neat, MD [facility]

## 2018-08-13 NOTE — Addendum Note (Signed)
Encounter addended by: Wynona Neat, MD on: 08/13/2018 12:39 PM  Actions taken: Clinical Note Signed

## 2018-08-17 DIAGNOSIS — M9904 Segmental and somatic dysfunction of sacral region: Secondary | ICD-10-CM | POA: Diagnosis not present

## 2018-08-17 DIAGNOSIS — M5387 Other specified dorsopathies, lumbosacral region: Secondary | ICD-10-CM | POA: Diagnosis not present

## 2018-08-17 DIAGNOSIS — M9903 Segmental and somatic dysfunction of lumbar region: Secondary | ICD-10-CM | POA: Diagnosis not present

## 2018-08-17 DIAGNOSIS — M9905 Segmental and somatic dysfunction of pelvic region: Secondary | ICD-10-CM | POA: Diagnosis not present

## 2018-08-31 ENCOUNTER — Other Ambulatory Visit: Payer: Self-pay

## 2018-08-31 ENCOUNTER — Other Ambulatory Visit: Payer: Self-pay | Admitting: Obstetrics and Gynecology

## 2018-08-31 ENCOUNTER — Ambulatory Visit (INDEPENDENT_AMBULATORY_CARE_PROVIDER_SITE_OTHER): Payer: BC Managed Care – PPO | Admitting: Obstetrics and Gynecology

## 2018-08-31 ENCOUNTER — Ambulatory Visit (INDEPENDENT_AMBULATORY_CARE_PROVIDER_SITE_OTHER): Payer: BC Managed Care – PPO

## 2018-08-31 VITALS — BP 108/68 | Wt 182.0 lb

## 2018-08-31 DIAGNOSIS — Z98891 History of uterine scar from previous surgery: Secondary | ICD-10-CM

## 2018-08-31 DIAGNOSIS — Z363 Encounter for antenatal screening for malformations: Secondary | ICD-10-CM

## 2018-08-31 DIAGNOSIS — Z6791 Unspecified blood type, Rh negative: Secondary | ICD-10-CM | POA: Diagnosis not present

## 2018-08-31 DIAGNOSIS — O99212 Obesity complicating pregnancy, second trimester: Secondary | ICD-10-CM

## 2018-08-31 DIAGNOSIS — Z3A20 20 weeks gestation of pregnancy: Secondary | ICD-10-CM

## 2018-08-31 DIAGNOSIS — O26899 Other specified pregnancy related conditions, unspecified trimester: Secondary | ICD-10-CM | POA: Diagnosis not present

## 2018-08-31 DIAGNOSIS — Z0489 Encounter for examination and observation for other specified reasons: Secondary | ICD-10-CM

## 2018-08-31 DIAGNOSIS — O0990 Supervision of high risk pregnancy, unspecified, unspecified trimester: Secondary | ICD-10-CM

## 2018-08-31 DIAGNOSIS — IMO0002 Reserved for concepts with insufficient information to code with codable children: Secondary | ICD-10-CM

## 2018-08-31 DIAGNOSIS — O09292 Supervision of pregnancy with other poor reproductive or obstetric history, second trimester: Secondary | ICD-10-CM

## 2018-08-31 DIAGNOSIS — O34219 Maternal care for unspecified type scar from previous cesarean delivery: Secondary | ICD-10-CM

## 2018-08-31 DIAGNOSIS — Z8759 Personal history of other complications of pregnancy, childbirth and the puerperium: Secondary | ICD-10-CM

## 2018-08-31 DIAGNOSIS — Z3689 Encounter for other specified antenatal screening: Secondary | ICD-10-CM

## 2018-08-31 DIAGNOSIS — O360190 Maternal care for anti-D [Rh] antibodies, unspecified trimester, not applicable or unspecified: Secondary | ICD-10-CM | POA: Diagnosis not present

## 2018-08-31 DIAGNOSIS — O26892 Other specified pregnancy related conditions, second trimester: Secondary | ICD-10-CM

## 2018-08-31 DIAGNOSIS — O0992 Supervision of high risk pregnancy, unspecified, second trimester: Secondary | ICD-10-CM

## 2018-08-31 DIAGNOSIS — Z8632 Personal history of gestational diabetes: Secondary | ICD-10-CM

## 2018-08-31 DIAGNOSIS — O360120 Maternal care for anti-D [Rh] antibodies, second trimester, not applicable or unspecified: Secondary | ICD-10-CM

## 2018-08-31 DIAGNOSIS — O99210 Obesity complicating pregnancy, unspecified trimester: Secondary | ICD-10-CM

## 2018-08-31 LAB — POCT URINALYSIS DIPSTICK OB
Glucose, UA: NEGATIVE
POC,PROTEIN,UA: NEGATIVE

## 2018-08-31 NOTE — Progress Notes (Signed)
ROB Anatomy Scan /It is a GIRL! 

## 2018-08-31 NOTE — Progress Notes (Signed)
  Routine Prenatal Care Visit  Subjective  Denise Walker is a 28 y.o. G6P2032 at [redacted]w[redacted]d being seen today for ongoing prenatal care.  She is currently monitored for the following issues for this high-risk pregnancy and has History of eclampsia; Rh negative state in antepartum period; Migraines; History of cesarean delivery; BRCA gene mutation negative; Short interval between pregnancies affecting pregnancy, antepartum; History of gestational diabetes; Maternal obesity, antepartum; Supervision of high risk pregnancy, antepartum; and Anti-D antibodies present during pregnancy on their problem list.  ----------------------------------------------------------------------------------- Patient reports no complaints.   Contractions: Not present. Vag. Bleeding: None.  Movement: Present. Denies leaking of fluid.  ----------------------------------------------------------------------------------- The following portions of the patient's history were reviewed and updated as appropriate: allergies, current medications, past family history, past medical history, past social history, past surgical history and problem list. Problem list updated.   Objective  Blood pressure 108/68, weight 182 lb (82.6 kg), last menstrual period 04/03/2018, not currently breastfeeding. Pregravid weight Pregravid weight not on file Total Weight Gain Not found. Urinalysis:      Fetal Status: Fetal Heart Rate (bpm): 144   Movement: Present     General:  Alert, oriented and cooperative. Patient is in no acute distress.  Skin: Skin is warm and dry. No rash noted.   Cardiovascular: Normal heart rate noted  Respiratory: Normal respiratory effort, no problems with respiration noted  Abdomen: Soft, gravid, appropriate for gestational age. Pain/Pressure: Present     Pelvic:  Cervical exam deferred        Extremities: Normal range of motion.     ental Status: Normal mood and affect. Normal behavior. Normal judgment and thought  content.   Us Ob Comp + 14 Wk  Result Date: 08/31/2018 Patient Name: Denise Walker DOB: 10/25/1990 MRN: [id number] ULTRASOUND REPORT Location: [facility] Date of Service: 08/31/2018 Indications:Anatomy Ultrasound Findings: Singleton intrauterine pregnancy is visualized with FHR at 144 BPM. Biometrics give an (U/S) Gestational age of [redacted]w[redacted]d and an (U/S) EDD of 01/19/2019; this correlates with the clinically established Estimated Date of Delivery: 01/16/19 Fetal presentation is Breech. EFW: 336 g ( 12 oz ). Placenta: anterior. Grade: 0 AFI: subjectively normal. Anatomic survey is incomplete for 4CH, RVOT, 3VV and normal; Gender - female.  Impression: 1. [redacted]w[redacted]d Viable Singleton Intrauterine pregnancy by U/S. 2. (U/S) EDD is consistent with Clinically established Estimated Date of Delivery: 01/16/19 . 3. Normal Anatomy Scan Recommendations: 1.Clinical correlation with the patient's History and Physical Exam. Elyse S Fairbanks, RT  There is a singleton gestation with subjectively normal amniotic fluid volume. The fetal biometry correlates with established dating. Detailed evaluation of the fetal anatomy was performed.The fetal anatomical survey appears within normal limits within the resolution of ultrasound as described above, with incomplete heart views.  It must be noted that a normal ultrasound is unable to rule out fetal aneuploidy, subtle defects such as small ASD or VDS may also not be visible on imaging.   , MD, FACOG [facility], [facility] 08/31/2018, 2:43 PM     Assessment   28 y.o. G6P2032 at [redacted]w[redacted]d by  01/16/2019, by Ultrasound presenting for routine prenatal visit  Plan   Pregnancy#6 Problems (from 04/03/18 to present)    Problem Noted Resolved   History of eclampsia 07/22/2017 by , , MD No   Priority:  Medium     Overview Addendum 09/03/2017  5:46 PM by , , MD    [X] Aspirin 81 mg daily after 12 weeks; discontinue after 36 weeks     Baseline and surveillance labs (pulled in from EPIC, refresh links as needed)  Lab Results  Component Value Date   PLT 292 07/22/2017   CREATININE 0.73 07/22/2017   AST 16 07/22/2017   ALT 13 07/22/2017   PROTCRRATIO 69 07/16/2014           Anti-D antibodies present during pregnancy 07/09/2018 by , , MD No   Overview Signed 07/09/2018 10:29 AM by , , MD    Last rhogam from prior pregnancy 02/11/2018 post delivery      Short interval between pregnancies affecting pregnancy, antepartum 06/02/2018 by , , MD No   History of gestational diabetes 06/02/2018 by , , MD No   Maternal obesity, antepartum 06/02/2018 by , , MD No   Supervision of high risk pregnancy, antepartum 06/02/2018 by , , MD No   Overview Addendum 07/09/2018 10:28 AM by , , MD    Clinic [facility] Prenatal Labs  Dating [redacted]w[redacted]d US Blood type: --/--/O NEG (12/31 1044)   Genetic Screen NIPS: XX Inheritest negative Antibody:POS (12/31 1044)  Anatomic US  Rubella: 0.97 (06/11 1453) Varicella: Immune  GTT [ ] 28 weeks RPR: Non Reactive (12/31 1000)   Rhogam  HBsAg: Negative (06/11 1453)   TDaP vaccine                       Flu Shot: HIV: NON REACTIVE (12/31 1000)   Baby Food                                GBS:  Contraception  Pap: 07/22/2017 NIL  CBB     CS/VBAC CS x 2   Support Person Husband Cody           History of cesarean delivery 02/10/2018 by Jackson, Stephen D, MD No   Rh negative state in antepartum period 08/23/2017 by , , MD No       Gestational age appropriate obstetric precautions including but not limited to vaginal bleeding, contractions, leaking of fluid and fetal movement were reviewed in detail with the patient.    - anatomy scan incomplete - repeat antibody screen today to monitor anti-D titers - discussed repeat C-section vs TOLAC particularly in lieu of short interval pregnancy   Return in about 4 weeks (around 09/28/2018) for ROB, follow up anatomy scan.   , MD, FACOG [facility], [facility] 08/31/2018, 7:13 PM    

## 2018-09-01 LAB — ANTIBODY SCREEN: Antibody Screen: NEGATIVE

## 2018-10-02 ENCOUNTER — Ambulatory Visit (INDEPENDENT_AMBULATORY_CARE_PROVIDER_SITE_OTHER): Payer: BC Managed Care – PPO

## 2018-10-02 ENCOUNTER — Other Ambulatory Visit: Payer: Self-pay

## 2018-10-02 ENCOUNTER — Ambulatory Visit (INDEPENDENT_AMBULATORY_CARE_PROVIDER_SITE_OTHER): Payer: BC Managed Care – PPO | Admitting: Obstetrics and Gynecology

## 2018-10-02 VITALS — BP 106/64 | Wt 188.0 lb

## 2018-10-02 DIAGNOSIS — Z8632 Personal history of gestational diabetes: Secondary | ICD-10-CM

## 2018-10-02 DIAGNOSIS — O09899 Supervision of other high risk pregnancies, unspecified trimester: Secondary | ICD-10-CM

## 2018-10-02 DIAGNOSIS — Z6791 Unspecified blood type, Rh negative: Secondary | ICD-10-CM

## 2018-10-02 DIAGNOSIS — Z8759 Personal history of other complications of pregnancy, childbirth and the puerperium: Secondary | ICD-10-CM

## 2018-10-02 DIAGNOSIS — Z98891 History of uterine scar from previous surgery: Secondary | ICD-10-CM

## 2018-10-02 DIAGNOSIS — O09892 Supervision of other high risk pregnancies, second trimester: Secondary | ICD-10-CM

## 2018-10-02 DIAGNOSIS — O09292 Supervision of pregnancy with other poor reproductive or obstetric history, second trimester: Secondary | ICD-10-CM

## 2018-10-02 DIAGNOSIS — O360190 Maternal care for anti-D [Rh] antibodies, unspecified trimester, not applicable or unspecified: Secondary | ICD-10-CM

## 2018-10-02 DIAGNOSIS — O0990 Supervision of high risk pregnancy, unspecified, unspecified trimester: Secondary | ICD-10-CM

## 2018-10-02 DIAGNOSIS — O99210 Obesity complicating pregnancy, unspecified trimester: Secondary | ICD-10-CM

## 2018-10-02 DIAGNOSIS — Z362 Encounter for other antenatal screening follow-up: Secondary | ICD-10-CM

## 2018-10-02 DIAGNOSIS — Z3A24 24 weeks gestation of pregnancy: Secondary | ICD-10-CM

## 2018-10-02 DIAGNOSIS — O26892 Other specified pregnancy related conditions, second trimester: Secondary | ICD-10-CM

## 2018-10-02 DIAGNOSIS — Z0489 Encounter for examination and observation for other specified reasons: Secondary | ICD-10-CM

## 2018-10-02 DIAGNOSIS — O99212 Obesity complicating pregnancy, second trimester: Secondary | ICD-10-CM

## 2018-10-02 DIAGNOSIS — Z1371 Encounter for nonprocreative screening for genetic disease carrier status: Secondary | ICD-10-CM

## 2018-10-02 DIAGNOSIS — IMO0002 Reserved for concepts with insufficient information to code with codable children: Secondary | ICD-10-CM

## 2018-10-02 DIAGNOSIS — O26899 Other specified pregnancy related conditions, unspecified trimester: Secondary | ICD-10-CM

## 2018-10-02 DIAGNOSIS — O34219 Maternal care for unspecified type scar from previous cesarean delivery: Secondary | ICD-10-CM

## 2018-10-02 DIAGNOSIS — O360130 Maternal care for anti-D [Rh] antibodies, third trimester, not applicable or unspecified: Secondary | ICD-10-CM

## 2018-10-02 NOTE — Progress Notes (Signed)
Routine Prenatal Care Visit  Subjective  Denise Walker is a 28 y.o. T2P4982 at 89w6dbeing seen today for ongoing prenatal care.  She is currently monitored for the following issues for this high-risk pregnancy and has History of eclampsia; Rh negative state in antepartum period; Migraines; History of cesarean delivery; BRCA gene mutation negative; Short interval between pregnancies affecting pregnancy, antepartum; History of gestational diabetes; Maternal obesity, antepartum; Supervision of high risk pregnancy, antepartum; and Anti-D antibodies present during pregnancy on their problem list.  ----------------------------------------------------------------------------------- Patient reports no complaints.   Contractions: Not present. Vag. Bleeding: None.  Movement: Present. Denies leaking of fluid.  ----------------------------------------------------------------------------------- The following portions of the patient's history were reviewed and updated as appropriate: allergies, current medications, past family history, past medical history, past social history, past surgical history and problem list. Problem list updated.   Objective  Blood pressure 106/64, weight 188 lb (85.3 kg), last menstrual period 04/03/2018, not currently breastfeeding. Pregravid weight Pregravid weight not on file Total Weight Gain Not found. Urinalysis:      Fetal Status: Fetal Heart Rate (bpm): 145   Movement: Present     General:  Alert, oriented and cooperative. Patient is in no acute distress.  Skin: Skin is warm and dry. No rash noted.   Cardiovascular: Normal heart rate noted  Respiratory: Normal respiratory effort, no problems with respiration noted  Abdomen: Soft, gravid, appropriate for gestational age. Pain/Pressure: Present     Pelvic:  Cervical exam deferred        Extremities: Normal range of motion.     ental Status: Normal mood and affect. Normal behavior. Normal judgment and thought  content.   [place] Follow Up  Result Date: 10/02/2018 Patient Name: KKajah SantizoDOB: 918-Jul-1992MRN: [id number] [facility] Date of Service: 10/02/2018 Indications: Anatomy follow up ultrasound Findings: SNelda Marseilleintrauterine pregnancy is visualized with FHR at 144 BPM. Fetal presentation is Breech. Placenta: anterior. Grade: 0 AFI: subjectively normal. Anatomic survey is complete.  4CH, 3VV and RVOT were seen today. There is no free peritoneal fluid in the cul de sac. Impression: 1. 261w6diable Singleton Intrauterine pregnancy previously established criteria. 2. Normal Anatomy Scan is now complete Recommendations: 1.Clinical correlation with the patient's History and Physical Exam. ElGweneth DimitriRT There is a singleton gestation with subjectively normal amniotic fluid volume.  Limited evaluation of the fetal anatomy was performed today, focusing on on anatomic structures not fully visualized at the time of prior study.The visualized fetal anatomical survey appears within normal limits within the resolution of ultrasound as described above, and the anatomic survey is now complete.  It must be noted that a normal ultrasound is unable to rule out fetal aneuploidy, subtle defects such as small ASD or VDS may also not be visible on imaging. AnMalachy MoodMD, FAYeomanB/GYN, [facility] 10/02/2018, 2:07 PM     Assessment   [age].o. G6M7W8088t 2455w6d  01/16/2019, by Ultrasound presenting for routine prenatal visit  Plan   Pregnancy#6 Problems (from 04/03/18 to present)    Problem Noted Resolved   History of eclampsia 07/22/2017 by StaMalachy MoodD No   Priority:  Medium     Overview Addendum 09/03/2017  5:46 PM by StaMalachy MoodD    [X]  Aspirin 81 mg daily after 12 weeks; discontinue after 36 weeks  Baseline and surveillance labs (pulled in from [facility] links as needed)  Lab Results  Component Value Date  PLT 292 07/22/2017    CREATININE 0.73 07/22/2017   AST 16 07/22/2017   ALT 13 07/22/2017   PROTCRRATIO 69 07/16/2014           Anti-D antibodies present during pregnancy 2018-08-07 by [name], MD No   Overview Signed 2018-08-07 10:29 AM by [name], MD    Last rhogam from prior pregnancy 02/11/2018 post delivery      Short interval between pregnancies affecting pregnancy, antepartum 06/02/2018 by [name], MD No   History of gestational diabetes 06/02/2018 by [name], MD No   Maternal obesity, antepartum 06/02/2018 by [name], MD No   Supervision of high risk pregnancy, antepartum 06/02/2018 by [name], MD No   Overview Addendum 08-07-18 10:28 AM by [name], MD    Clinic [facility] Prenatal Labs  Dating 72w2dUKoreaBlood type: --/--/O NEG (12/31 1044)   Genetic Screen NIPS: XX Inheritest negative Antibody:POS (12/31 1044)  Anatomic [place] Rubella: 0.97 (06/11 1453) Varicella: Immune  GTT [ ]  28 weeks RPR: Non Reactive (12/31 1000)   Rhogam  HBsAg: Negative (06/11 1453)   TDaP vaccine                       Flu Shot: HIV: NON REACTIVE (12/31 1000)   Baby Food                                GBS:  Contraception  Pap: 07/22/2017 NIL  CBB     CS/VBAC CS x 2   Support Person Husband Cody           History of cesarean delivery 02/10/2018 by JWill Bonnet MD No   Rh negative state in antepartum period 08/23/2017 by SMalachy Mood MD No       Gestational age appropriate obstetric precautions including but not limited to vaginal bleeding, contractions, leaking of fluid and fetal movement were reviewed in detail with the patient.    Return in about 4 weeks (around 10/30/2018) for ROB and 28 week labs.  AMalachy Mood MD, F[facility] Strike[facility]/GYN, [facility] 10/02/2018, 2:35 PM

## 2018-10-02 NOTE — Progress Notes (Signed)
ROB Follow up anatomy scan Refill Fioricet

## 2018-11-02 ENCOUNTER — Other Ambulatory Visit: Payer: BC Managed Care – PPO

## 2018-11-02 ENCOUNTER — Ambulatory Visit (INDEPENDENT_AMBULATORY_CARE_PROVIDER_SITE_OTHER): Payer: BC Managed Care – PPO | Admitting: Obstetrics and Gynecology

## 2018-11-02 ENCOUNTER — Other Ambulatory Visit: Payer: Self-pay

## 2018-11-02 VITALS — BP 120/80 | Wt 195.0 lb

## 2018-11-02 DIAGNOSIS — Z8632 Personal history of gestational diabetes: Secondary | ICD-10-CM

## 2018-11-02 DIAGNOSIS — O26893 Other specified pregnancy related conditions, third trimester: Secondary | ICD-10-CM

## 2018-11-02 DIAGNOSIS — Z3A29 29 weeks gestation of pregnancy: Secondary | ICD-10-CM

## 2018-11-02 DIAGNOSIS — O34219 Maternal care for unspecified type scar from previous cesarean delivery: Secondary | ICD-10-CM

## 2018-11-02 DIAGNOSIS — Z98891 History of uterine scar from previous surgery: Secondary | ICD-10-CM

## 2018-11-02 DIAGNOSIS — O09293 Supervision of pregnancy with other poor reproductive or obstetric history, third trimester: Secondary | ICD-10-CM

## 2018-11-02 DIAGNOSIS — Z6791 Unspecified blood type, Rh negative: Secondary | ICD-10-CM

## 2018-11-02 DIAGNOSIS — Z8759 Personal history of other complications of pregnancy, childbirth and the puerperium: Secondary | ICD-10-CM | POA: Diagnosis not present

## 2018-11-02 DIAGNOSIS — Z1371 Encounter for nonprocreative screening for genetic disease carrier status: Secondary | ICD-10-CM

## 2018-11-02 DIAGNOSIS — O99210 Obesity complicating pregnancy, unspecified trimester: Secondary | ICD-10-CM

## 2018-11-02 DIAGNOSIS — K219 Gastro-esophageal reflux disease without esophagitis: Secondary | ICD-10-CM

## 2018-11-02 DIAGNOSIS — O360190 Maternal care for anti-D [Rh] antibodies, unspecified trimester, not applicable or unspecified: Secondary | ICD-10-CM

## 2018-11-02 DIAGNOSIS — O26899 Other specified pregnancy related conditions, unspecified trimester: Secondary | ICD-10-CM

## 2018-11-02 DIAGNOSIS — O09899 Supervision of other high risk pregnancies, unspecified trimester: Secondary | ICD-10-CM

## 2018-11-02 DIAGNOSIS — O99213 Obesity complicating pregnancy, third trimester: Secondary | ICD-10-CM

## 2018-11-02 DIAGNOSIS — O0990 Supervision of high risk pregnancy, unspecified, unspecified trimester: Secondary | ICD-10-CM

## 2018-11-02 LAB — POCT URINALYSIS DIPSTICK OB
Glucose, UA: NEGATIVE
POC,PROTEIN,UA: NEGATIVE

## 2018-11-02 MED ORDER — OMEPRAZOLE 20 MG PO CPDR: 20.0000 mg | DELAYED_RELEASE_CAPSULE | Freq: Every day | ORAL | 11 refills | Status: DC

## 2018-11-02 MED ORDER — RHO D IMMUNE GLOBULIN 1500 UNIT/2ML IJ SOSY
300.0000 ug | PREFILLED_SYRINGE | Freq: Once | INTRAMUSCULAR | Status: AC
  Administered 2018-11-02: 300 ug via INTRAMUSCULAR

## 2018-11-02 NOTE — Progress Notes (Addendum)
Routine Prenatal Care Visit  Subjective  Denise Walker is a 28 y.o. G6Y6948 at 56w2dbeing seen today for ongoing prenatal care.  She is currently monitored for the following issues for this high-risk pregnancy and has History of eclampsia; Rh negative state in antepartum period; Migraines; History of cesarean delivery; BRCA gene mutation negative; Short interval between pregnancies affecting pregnancy, antepartum; History of gestational diabetes; Maternal obesity, antepartum; Supervision of high risk pregnancy, antepartum; and Anti-D antibodies present during pregnancy on their problem list.  ----------------------------------------------------------------------------------- Patient reports no complaints.   Contractions: Not present. Vag. Bleeding: None.  Movement: Present. Denies leaking of fluid.  ----------------------------------------------------------------------------------- The following portions of the patient's history were reviewed and updated as appropriate: allergies, current medications, past family history, past medical history, past social history, past surgical history and problem list. Problem list updated.   Objective  Blood pressure 120/80, weight 195 lb (88.5 kg), last menstrual period 04/03/2018, not currently breastfeeding. Body mass index is 39.39 kg/m.  Pregravid weight Pregravid weight not on file Total Weight Gain Not found. Urinalysis:      Fetal Status: Fetal Heart Rate (bpm): 140 Fundal Height: 27 cm Movement: Present     General:  Alert, oriented and cooperative. Patient is in no acute distress.  Skin: Skin is warm and dry. No rash noted.   Cardiovascular: Normal heart rate noted  Respiratory: Normal respiratory effort, no problems with respiration noted  Abdomen: Soft, gravid, appropriate for gestational age. Pain/Pressure: Present     Pelvic:  Cervical exam deferred        Extremities: Normal range of motion.     ental Status: Normal mood and  affect. Normal behavior. Normal judgment and thought content.     Assessment   28y.o. GN4O2703at 248w2dy  01/16/2019, by Ultrasound presenting for routine prenatal visit  Plan   Pregnancy#6 Problems (from 04/03/18 to present)    Problem Noted Resolved   History of eclampsia 07/22/2017 by StMalachy MoodMD No   Priority:  Medium     Overview Addendum 09/03/2017  5:46 PM by StMalachy MoodMD    _0  Aspirin 81 mg daily after 12 weeks; discontinue after 36 weeks  Baseline and surveillance labs ([facility])  Lab Results  Component Value Date   PLT 292 07/22/2017   CREATININE 0.73 07/22/2017   AST 16 07/22/2017   ALT 13 07/22/2017   PROTCRRATIO 69 07/16/2014           Anti-D antibodies present during pregnancy 06/2018/06/09y StMalachy MoodMD No   Overview Signed 5/June 09, 20200:29 AM by StMalachy MoodMD    Last rhogam from prior pregnancy 02/11/2018 post delivery      Short interval between pregnancies affecting pregnancy, antepartum 06/02/2018 by StMalachy MoodMD No   History of gestational diabetes 06/02/2018 by StMalachy MoodMD No   Maternal obesity, antepartum 06/02/2018 by StMalachy MoodMD No   Supervision of high risk pregnancy, antepartum 06/02/2018 by StMalachy MoodMD No   Overview Addendum 10/02/2018  2:36 PM by StMalachy MoodMD    Clinic [facility] Prenatal Labs  Dating 9w43w2d Koreaood type: --/--/O NEG (12/31 1044)   Genetic Screen NIPS: XX Inheritest negative Antibody:POS (12/31 1044)  Anatomic US [place] Rubella: 0.97 (06/11 1453) Varicella: Immune  GTT _1  28 weeks RPR: Non Reactive (12/31 1000)   Rhogam  HBsAg: Negative (06/11 1453)   TDaP vaccine  Flu Shot: HIV: NON REACTIVE (12/31 1000)   Baby Food                                GBS:  Contraception  Pap: 07/22/2017 NIL  CBB     CS/VBAC CS x 2   Support Person Husband Cody           History of cesarean delivery  02/10/2018 by Will Bonnet, MD No   Rh negative state in antepartum period 08/23/2017 by [name], MD No       Gestational age appropriate obstetric precautions including but not limited to vaginal bleeding, contractions, leaking of fluid and fetal movement were reviewed in detail with the patient.    - TDAP and flu shot next visit - Rhogam today - Worsening GERD, Rx omeprazole   Return in about 2 weeks (around 11/16/2018) for ROB.  [name], MD, [facility], [facility] 11/02/2018, 9:17 AM

## 2018-11-02 NOTE — Addendum Note (Signed)
Addended by: Dorthula Nettles on: 11/02/2018 09:36 AM   Modules accepted: Orders

## 2018-11-02 NOTE — Progress Notes (Signed)
ROB 28 week labs 

## 2018-11-03 ENCOUNTER — Other Ambulatory Visit: Payer: Self-pay | Admitting: Obstetrics and Gynecology

## 2018-11-03 ENCOUNTER — Telehealth: Payer: Self-pay | Admitting: Obstetrics and Gynecology

## 2018-11-03 ENCOUNTER — Other Ambulatory Visit: Payer: Self-pay

## 2018-11-03 DIAGNOSIS — O24410 Gestational diabetes mellitus in pregnancy, diet controlled: Secondary | ICD-10-CM

## 2018-11-03 DIAGNOSIS — O24419 Gestational diabetes mellitus in pregnancy, unspecified control: Secondary | ICD-10-CM | POA: Insufficient documentation

## 2018-11-03 DIAGNOSIS — O0990 Supervision of high risk pregnancy, unspecified, unspecified trimester: Secondary | ICD-10-CM

## 2018-11-03 DIAGNOSIS — O09899 Supervision of other high risk pregnancies, unspecified trimester: Secondary | ICD-10-CM

## 2018-11-03 DIAGNOSIS — O360190 Maternal care for anti-D [Rh] antibodies, unspecified trimester, not applicable or unspecified: Secondary | ICD-10-CM

## 2018-11-03 LAB — 28 WEEKS RH-PANEL
Antibody Screen: NEGATIVE
Basophils Absolute: 0 10*3/uL (ref 0.0–0.2)
Basos: 0 %
EOS (ABSOLUTE): 0.2 10*3/uL (ref 0.0–0.4)
Eos: 2 %
Gestational Diabetes Screen: 210 mg/dL — ABNORMAL HIGH (ref 65–139)
HIV Screen 4th Generation wRfx: NONREACTIVE
Hematocrit: 35.2 % (ref 34.0–46.6)
Hemoglobin: 11.1 g/dL (ref 11.1–15.9)
Immature Grans (Abs): 0.1 10*3/uL (ref 0.0–0.1)
Immature Granulocytes: 1 %
Lymphocytes Absolute: 1.4 10*3/uL (ref 0.7–3.1)
Lymphs: 16 %
MCH: 28.2 pg (ref 26.6–33.0)
MCHC: 31.5 g/dL (ref 31.5–35.7)
MCV: 90 fL (ref 79–97)
Monocytes Absolute: 0.5 10*3/uL (ref 0.1–0.9)
Monocytes: 6 %
Neutrophils Absolute: 6.5 10*3/uL (ref 1.4–7.0)
Neutrophils: 75 %
Platelets: 208 10*3/uL (ref 150–450)
RBC: 3.93 x10E6/uL (ref 3.77–5.28)
RDW: 13 % (ref 11.7–15.4)
RPR Ser Ql: NONREACTIVE
WBC: 8.7 10*3/uL (ref 3.4–10.8)

## 2018-11-03 MED ORDER — ACCU-CHEK GUIDE VI STRP: ORAL_STRIP | 12 refills | Status: DC

## 2018-11-03 MED ORDER — ACCU-CHEK FASTCLIX LANCETS MISC: 1.0000 [IU] | Freq: Four times a day (QID) | 12 refills | Status: DC

## 2018-11-03 NOTE — Telephone Encounter (Signed)
Called and left voice mail for patient to call back to be schedule °

## 2018-11-03 NOTE — Telephone Encounter (Signed)
-----   Message from [name], MD sent at 11/03/2018  2:57 PM EDT ----- Regarding: growth scan Patient needs growth scan and ROB next week

## 2018-11-11 ENCOUNTER — Ambulatory Visit (INDEPENDENT_AMBULATORY_CARE_PROVIDER_SITE_OTHER): Payer: BC Managed Care – PPO

## 2018-11-11 ENCOUNTER — Other Ambulatory Visit: Payer: Self-pay

## 2018-11-11 ENCOUNTER — Ambulatory Visit (INDEPENDENT_AMBULATORY_CARE_PROVIDER_SITE_OTHER): Payer: BC Managed Care – PPO | Admitting: Obstetrics and Gynecology

## 2018-11-11 DIAGNOSIS — Z362 Encounter for other antenatal screening follow-up: Secondary | ICD-10-CM | POA: Diagnosis not present

## 2018-11-11 DIAGNOSIS — O0990 Supervision of high risk pregnancy, unspecified, unspecified trimester: Secondary | ICD-10-CM

## 2018-11-11 DIAGNOSIS — O24414 Gestational diabetes mellitus in pregnancy, insulin controlled: Secondary | ICD-10-CM

## 2018-11-11 DIAGNOSIS — O24410 Gestational diabetes mellitus in pregnancy, diet controlled: Secondary | ICD-10-CM

## 2018-11-11 DIAGNOSIS — O09293 Supervision of pregnancy with other poor reproductive or obstetric history, third trimester: Secondary | ICD-10-CM

## 2018-11-11 DIAGNOSIS — O09899 Supervision of other high risk pregnancies, unspecified trimester: Secondary | ICD-10-CM

## 2018-11-11 DIAGNOSIS — Z3A30 30 weeks gestation of pregnancy: Secondary | ICD-10-CM

## 2018-11-11 NOTE — Progress Notes (Signed)
Routine Prenatal Care Visit  Subjective  Denise Walker is a 28 y.o. [id number] at 51w4dbeing seen today for ongoing prenatal care.  She is currently monitored for the following issues for this high-risk pregnancy and has History of eclampsia; Rh negative state in antepartum period; Migraines; History of cesarean delivery; BRCA gene mutation negative; Short interval between pregnancies affecting pregnancy, antepartum; History of gestational diabetes; Maternal obesity, antepartum; Supervision of high risk pregnancy, antepartum; Anti-D antibodies present during pregnancy; and Gestational diabetes on their problem list.  ----------------------------------------------------------------------------------- Patient reports no complaints.   Contractions: Not present. Vag. Bleeding: None.  Movement: Present. Denies leaking of fluid.  ----------------------------------------------------------------------------------- The following portions of the patient's history were reviewed and updated as appropriate: allergies, current medications, past family history, past medical history, past social history, past surgical history and problem list. Problem list updated.   Objective  Last menstrual period 04/03/2018, not currently breastfeeding. Pregravid weight Pregravid weight not on file Total Weight Gain Not found. Urinalysis:      Fetal Status: Fetal Heart Rate (bpm): 141   Movement: Present  Presentation: FPilar PlateBreech  No physical exam as this was a remote telephone visit to promote social distancing during the current COVID-19 Pandemic  [place] Follow Up  Result Date: 11/11/2018 Patient Name: Denise BacchiDOB: [birth year]MRN: [id number] [facility] Date of Service: 11/11/2018 Indications:growth/afi Findings: SNelda Marseilleintrauterine pregnancy is visualized with FHR at 141 BPM. Biometrics give an (U/S) Gestational age of 273w6dnd an (U/S) EDD of 01/21/2019; this correlates with  the clinically established Estimated Date of Delivery: 01/16/19. Fetal presentation is Breech. Placenta: anterior. Grade: 1 AFI: 18.1 cm Growth percentile is 35.8%. EFW: 1444g  ( 3 lb 3 oz ) Impression: 1. 3055w4dable Singleton Intrauterine pregnancy previously established criteria. 2. Growth is 35.8 %ile.  AFI is 18.1 cm. Recommendations: 1.Clinical correlation with the patient's History and Physical Exam. ElyGweneth DimitriT There is a singleton gestation with normal amniotic fluid volume. The fetal biometry correlates with established dating.  Limited fetal anatomy was performed.The visualized fetal anatomical survey appears within normal limits within the resolution of ultrasound as described above.  It must be noted that a normal ultrasound is unable to rule out fetal aneuploidy.  AndMalachy MoodD, FACAmericus/GYN, [place] 11/11/2018, 10:41 AM     Assessment   [age]o. [id number] 30w33w4d 01/16/2019, by Ultrasound presenting for routine prenatal visit  Plan   Pregnancy#6 Problems (from 04/03/18 to present)    Problem Noted Resolved   History of eclampsia 07/22/2017 by StaeMalachy Mood No   Priority:  Medium     Overview Addendum 09/03/2017  5:46 PM by StaeMalachy Mood    [X]  Aspirin 81 mg daily after 12 weeks; discontinue after 36 weeks  Baseline and surveillance labs ([facility] from [facility] as needed)  Lab Results  Component Value Date   PLT 292 07/22/2017   CREATININE 0.73 07/22/2017   AST 16 07/22/2017   ALT 13 07/22/2017   PROTCRRATIO 69 07/16/2014           Gestational diabetes 11/03/2018 by StaeMalachy Mood No   Anti-D antibodies present during pregnancy 5/282020-05-29StaeMalachy Mood No   Overview Addendum 11/03/2018  2:57 PM by StaeMalachy Mood    Last rhogam from prior pregnancy 02/11/2018 post delivery.  Negative on 7/20 and 9/21.  Rhogam received 9/21      Short  interval between pregnancies affecting  pregnancy, antepartum 06/02/2018 by [name], MD No   History of gestational diabetes 06/02/2018 by [name], MD No   Maternal obesity, antepartum 06/02/2018 by [name], MD No   Supervision of high risk pregnancy, antepartum 06/02/2018 by [name], MD No   Overview Addendum 11/03/2018  2:55 PM by [name], [place] Prenatal Labs  Dating 83w2dUKoreaBlood type: --/--/O NEG (12/31 1044)   Genetic Screen NIPS: XX Inheritest negative Antibody:POS (12/31 1044)  Anatomic UKoreaNormal Rubella: 0.97 (06/11 1453) Varicella: Immune  GTT Early 3-hr 90 / 164 / 108 / 74[X]  28 weeks 1-hr 210 RPR: Non Reactive (12/31 1000)   Rhogam 11/02/18 HBsAg: Negative (06/11 1453)   TDaP vaccine                       Flu Shot: HIV: NON REACTIVE (12/31 1000)   Baby Food Breast                     GBS:  Contraception IUD Pap: 07/22/2017 NIL  CBB     CS/VBAC CS x 2   Support Person Husband Cody           History of cesarean delivery 02/10/2018 by JWill Bonnet MD No   Rh negative state in antepartum period 08/23/2017 by SMalachy Mood MD No       Gestational age appropriate obstetric precautions including but not limited to vaginal bleeding, contractions, leaking of fluid and fetal movement were reviewed in detail with the patient.    1) GDM - meal values in range with a few random elevated values, fasting most consistently elevated - insulin pen Rx 2) Scheduled C-section 01/05/2019  No follow-ups on file.  AMalachy Mood MD, FD[facility]ringtonOB/GYN, [facility] 11/11/2018, 10:42 AM

## 2018-11-12 ENCOUNTER — Telehealth: Payer: Self-pay | Admitting: Obstetrics and Gynecology

## 2018-11-12 NOTE — Telephone Encounter (Signed)
Patient is aware of H&P on 12/28/18 @ 9:10am w/ Dr. Georgianne Fick, Pre-admit testing to be scheduled, COVID testing on 01/01/19, and OR on 01/05/19. Patient is aware she will be asked to quarantine after COVID testing.

## 2018-11-12 NOTE — Telephone Encounter (Signed)
-----   Message from [name], MD sent at 11/11/2018  1:21 PM EDT ----- Regarding: C-section Surgery Date: 01/05/2019  LOS: surgery admit  Surgery Booking Request Patient Full Name: Denise Walker MRN: [id number]  DOB: [birth year]  Surgeon: [name], MD  Requested Surgery Date and Time: 7:30 AM Primary Diagnosis and Code: History of cesarean section Secondary Diagnosis and Code: Gestational diabetes Surgical Procedure: Cesarean Section L&D Notification:yes Admission Status: surgery admit Length of Surgery: 1hr Special Case Needs: none H&P: week prio (date) Phone Interview or Office Pre-Admit: pre-admit Interpreter: No Language: English Medical Clearance: No Special Scheduling Instructions: First section because of diabetes.  Also this is my second patient having a C-section that day so we may nee to block my morning as I don't have any OR days that week.

## 2018-11-13 ENCOUNTER — Other Ambulatory Visit: Payer: Self-pay | Admitting: Obstetrics and Gynecology

## 2018-11-13 DIAGNOSIS — O24414 Gestational diabetes mellitus in pregnancy, insulin controlled: Secondary | ICD-10-CM

## 2018-11-13 DIAGNOSIS — O0990 Supervision of high risk pregnancy, unspecified, unspecified trimester: Secondary | ICD-10-CM

## 2018-11-16 ENCOUNTER — Encounter: Payer: BC Managed Care – PPO | Admitting: Obstetrics and Gynecology

## 2018-11-18 ENCOUNTER — Other Ambulatory Visit: Payer: Self-pay | Admitting: Obstetrics and Gynecology

## 2018-11-18 MED ORDER — NOVOFINE 32G X 6 MM MISC: 1.0000 [IU] | Freq: Four times a day (QID) | 3 refills | Status: DC

## 2018-11-18 MED ORDER — INSULIN ASPART 100 UNIT/ML FLEXPEN: 4.0000 [IU] | PEN_INJECTOR | Freq: Three times a day (TID) | SUBCUTANEOUS | 11 refills | Status: DC

## 2018-11-18 MED ORDER — LANTUS SOLOSTAR 100 UNIT/ML ~~LOC~~ SOPN: 16.0000 [IU] | PEN_INJECTOR | Freq: Every day | SUBCUTANEOUS | 11 refills | Status: DC

## 2018-11-18 NOTE — Telephone Encounter (Signed)
I know you use particular pens and I've seen the email. I just can't figure out how to know if a particular pen is covered. I'm always happy to do what I usually do and just give insulin without the pen.  But, it sounds like you've talked about it with her. Since you're back tomorrow, maybe you can pick out a pen for her.  I'd like to pick your brain and see how you figure out which to use apart from just randomly picking one out.

## 2018-11-19 ENCOUNTER — Ambulatory Visit (INDEPENDENT_AMBULATORY_CARE_PROVIDER_SITE_OTHER): Payer: BC Managed Care – PPO | Admitting: Obstetrics and Gynecology

## 2018-11-19 ENCOUNTER — Other Ambulatory Visit: Payer: Self-pay

## 2018-11-19 VITALS — BP 118/78 | Wt 192.0 lb

## 2018-11-19 DIAGNOSIS — Z3A31 31 weeks gestation of pregnancy: Secondary | ICD-10-CM

## 2018-11-19 DIAGNOSIS — O26899 Other specified pregnancy related conditions, unspecified trimester: Secondary | ICD-10-CM

## 2018-11-19 DIAGNOSIS — Z98891 History of uterine scar from previous surgery: Secondary | ICD-10-CM

## 2018-11-19 DIAGNOSIS — O34219 Maternal care for unspecified type scar from previous cesarean delivery: Secondary | ICD-10-CM

## 2018-11-19 DIAGNOSIS — O09899 Supervision of other high risk pregnancies, unspecified trimester: Secondary | ICD-10-CM

## 2018-11-19 DIAGNOSIS — O99213 Obesity complicating pregnancy, third trimester: Secondary | ICD-10-CM

## 2018-11-19 DIAGNOSIS — O0993 Supervision of high risk pregnancy, unspecified, third trimester: Secondary | ICD-10-CM

## 2018-11-19 DIAGNOSIS — Z23 Encounter for immunization: Secondary | ICD-10-CM

## 2018-11-19 DIAGNOSIS — Z6791 Unspecified blood type, Rh negative: Secondary | ICD-10-CM

## 2018-11-19 DIAGNOSIS — O99210 Obesity complicating pregnancy, unspecified trimester: Secondary | ICD-10-CM

## 2018-11-19 DIAGNOSIS — O26893 Other specified pregnancy related conditions, third trimester: Secondary | ICD-10-CM

## 2018-11-19 DIAGNOSIS — O0990 Supervision of high risk pregnancy, unspecified, unspecified trimester: Secondary | ICD-10-CM

## 2018-11-19 DIAGNOSIS — O24414 Gestational diabetes mellitus in pregnancy, insulin controlled: Secondary | ICD-10-CM

## 2018-11-19 LAB — POCT URINALYSIS DIPSTICK OB
Glucose, UA: NEGATIVE
POC,PROTEIN,UA: NEGATIVE

## 2018-11-19 NOTE — Telephone Encounter (Signed)
advise

## 2018-11-19 NOTE — Progress Notes (Signed)
Routine Prenatal Care Visit  Subjective  Denise Walker is a 28 y.o. [id number] at 46w5dbeing seen today for ongoing prenatal care.  She is currently monitored for the following issues for this high-risk pregnancy and has History of eclampsia; Rh negative state in antepartum period; Migraines; History of cesarean delivery; BRCA gene mutation negative; Short interval between pregnancies affecting pregnancy, antepartum; History of gestational diabetes; Maternal obesity, antepartum; Supervision of high risk pregnancy, antepartum; Anti-D antibodies present during pregnancy; and Gestational diabetes on their problem list.  ----------------------------------------------------------------------------------- Patient reports no complaints.   Contractions: Not present. Vag. Bleeding: None.  Movement: Present. Denies leaking of fluid.  ----------------------------------------------------------------------------------- The following portions of the patient's history were reviewed and updated as appropriate: allergies, current medications, past family history, past medical history, past social history, past surgical history and problem list. Problem list updated.   Objective  Blood pressure 118/78, weight 192 lb (87.1 kg), last menstrual period 04/03/2018, not currently breastfeeding. Pregravid weight Pregravid weight not on file Total Weight Gain Not found. Urinalysis:      Fetal Status: Fetal Heart Rate (bpm): 135 Fundal Height: 31 cm Movement: Present     General:  Alert, oriented and cooperative. Patient is in no acute distress.  Skin: Skin is warm and dry. No rash noted.   Cardiovascular: Normal heart rate noted  Respiratory: Normal respiratory effort, no problems with respiration noted  Abdomen: Soft, gravid, appropriate for gestational age. Pain/Pressure: Absent     Pelvic:  Cervical exam deferred        Extremities: Normal range of motion.     ental Status: Normal mood and affect. Normal  behavior. Normal judgment and thought content.     Assessment   28y.o. [id number] 363w5dy  01/16/2019, by Ultrasound presenting for routine prenatal visit  Plan   Pregnancy#6 Problems (from 04/03/18 to present)    Problem Noted Resolved   History of eclampsia 07/22/2017 by StMalachy MoodMD No   Priority:  Medium     Overview Addendum 09/03/2017  5:46 PM by StMalachy MoodMD    [X]  Aspirin 81 mg daily after 12 weeks; discontinue after 36 weeks  Baseline and surveillance labs ([facility] as needed)  Lab Results  Component Value Date   PLT 292 07/22/2017   CREATININE 0.73 07/22/2017   AST 16 07/22/2017   ALT 13 07/22/2017   PROTCRRATIO 69 07/16/2014           Gestational diabetes 11/03/2018 by StMalachy MoodMD No   Anti-D antibodies present during pregnancy 5/06-13-20y StMalachy MoodMD No   Overview Addendum 11/03/2018  2:57 PM by StMalachy MoodMD    Last rhogam from prior pregnancy 02/11/2018 post delivery.  Negative on 7/20 and 9/21.  Rhogam received 9/21      Short interval between pregnancies affecting pregnancy, antepartum 06/02/2018 by StMalachy MoodMD No   History of gestational diabetes 06/02/2018 by StMalachy MoodMD No   Maternal obesity, antepartum 06/02/2018 by StMalachy MoodMD No   Supervision of high risk pregnancy, antepartum 06/02/2018 by StMalachy MoodMD No   Overview Addendum 11/19/2018  8:17 AM by StMalachy MoodMD    Clinic [facility] Prenatal Labs  Dating 9w51w2d Koreaood type: --/--/O NEG (12/31 1044)   Genetic Screen NIPS: XX Inheritest negative Antibody:POS (12/31 1044)  Anatomic US [place]armal Rubella: 0.97 (06/11 1453) Varicella: Immune  GTT Early 3-hr 90 / 164 / 108 / 74[X]  28 weeks 1-hr  210 RPR: Non Reactive (12/31 1000)   Rhogam 11/02/18 HBsAg: Negative (06/11 1453)   TDaP vaccine 11/19/18  Flu Shot: HIV: NON REACTIVE (12/31 1000)   Baby Food Breast                           GBS:   Contraception IUD Pap: 07/22/2017 NIL  CBB     CS/VBAC CS x 2 (repeat 01/05/2019)   Support Person Husband Denise Walker           History of cesarean delivery 02/10/2018 by Will Bonnet, MD No   Rh negative state in antepartum period 08/23/2017 by [name], MD No       Gestational age appropriate obstetric precautions including but not limited to vaginal bleeding, contractions, leaking of fluid and fetal movement were reviewed in detail with the patient.    - BG log reviewed, note from pharmacy that glargine Kiwkpen preferred so changed long acting (16 Units), still has Novolog 100unit/mL flexpens - Fasting remain consistently elevated upper 90's to low 100's  Return in about 1 week (around 11/26/2018) for 1 week ROB, 2 week ROB and and growth scan.  [name], MD, [facility], [facility] 11/19/2018, 8:32 AM

## 2018-11-19 NOTE — Progress Notes (Signed)
TDAP and BT consent today. No vb. No lof.

## 2018-11-20 ENCOUNTER — Other Ambulatory Visit: Payer: Self-pay | Admitting: Obstetrics and Gynecology

## 2018-11-20 MED ORDER — NOVOFINE 32G X 6 MM MISC: 4.0000 [IU] | Freq: Every day | 3 refills | Status: DC

## 2018-11-25 ENCOUNTER — Other Ambulatory Visit: Payer: Self-pay

## 2018-11-25 ENCOUNTER — Ambulatory Visit (INDEPENDENT_AMBULATORY_CARE_PROVIDER_SITE_OTHER): Payer: BC Managed Care – PPO | Admitting: Obstetrics and Gynecology

## 2018-11-25 VITALS — BP 107/67 | Wt 193.0 lb

## 2018-11-25 DIAGNOSIS — O360130 Maternal care for anti-D [Rh] antibodies, third trimester, not applicable or unspecified: Secondary | ICD-10-CM

## 2018-11-25 DIAGNOSIS — O0993 Supervision of high risk pregnancy, unspecified, third trimester: Secondary | ICD-10-CM

## 2018-11-25 DIAGNOSIS — O99213 Obesity complicating pregnancy, third trimester: Secondary | ICD-10-CM

## 2018-11-25 DIAGNOSIS — O26893 Other specified pregnancy related conditions, third trimester: Secondary | ICD-10-CM

## 2018-11-25 DIAGNOSIS — Z3A32 32 weeks gestation of pregnancy: Secondary | ICD-10-CM

## 2018-11-25 DIAGNOSIS — Z6791 Unspecified blood type, Rh negative: Secondary | ICD-10-CM

## 2018-11-25 DIAGNOSIS — Z8759 Personal history of other complications of pregnancy, childbirth and the puerperium: Secondary | ICD-10-CM

## 2018-11-25 DIAGNOSIS — O99210 Obesity complicating pregnancy, unspecified trimester: Secondary | ICD-10-CM

## 2018-11-25 DIAGNOSIS — O360190 Maternal care for anti-D [Rh] antibodies, unspecified trimester, not applicable or unspecified: Secondary | ICD-10-CM

## 2018-11-25 DIAGNOSIS — O26899 Other specified pregnancy related conditions, unspecified trimester: Secondary | ICD-10-CM

## 2018-11-25 DIAGNOSIS — O09899 Supervision of other high risk pregnancies, unspecified trimester: Secondary | ICD-10-CM

## 2018-11-25 DIAGNOSIS — Z98891 History of uterine scar from previous surgery: Secondary | ICD-10-CM

## 2018-11-25 DIAGNOSIS — O09293 Supervision of pregnancy with other poor reproductive or obstetric history, third trimester: Secondary | ICD-10-CM

## 2018-11-25 DIAGNOSIS — O34219 Maternal care for unspecified type scar from previous cesarean delivery: Secondary | ICD-10-CM

## 2018-11-25 DIAGNOSIS — O24414 Gestational diabetes mellitus in pregnancy, insulin controlled: Secondary | ICD-10-CM

## 2018-11-25 DIAGNOSIS — O0990 Supervision of high risk pregnancy, unspecified, unspecified trimester: Secondary | ICD-10-CM

## 2018-11-25 DIAGNOSIS — Z8632 Personal history of gestational diabetes: Secondary | ICD-10-CM

## 2018-11-25 LAB — POCT URINALYSIS DIPSTICK OB
Glucose, UA: NEGATIVE
POC,PROTEIN,UA: NEGATIVE

## 2018-11-25 NOTE — Progress Notes (Signed)
Routine Prenatal Care Visit  Subjective  Denise Walker is a 28 y.o. [id number] at 48w4dbeing seen today for ongoing prenatal care.  She is currently monitored for the following issues for this high-risk pregnancy and has History of eclampsia; Rh negative state in antepartum period; Migraines; History of cesarean delivery; BRCA gene mutation negative; Short interval between pregnancies affecting pregnancy, antepartum; History of gestational diabetes; Maternal obesity, antepartum; Supervision of high risk pregnancy, antepartum; Anti-D antibodies present during pregnancy; and Gestational diabetes on their problem list.  ----------------------------------------------------------------------------------- Patient reports no complaints.   Contractions: Not present.  .  Movement: Present. Denies leaking of fluid.  ----------------------------------------------------------------------------------- The following portions of the patient's history were reviewed and updated as appropriate: allergies, current medications, past family history, past medical history, past social history, past surgical history and problem list. Problem list updated.   Objective  Blood pressure 107/67, weight 193 lb (87.5 kg), last menstrual period 04/03/2018, not currently breastfeeding. Pregravid weight 160 lb (72.6 kg) Total Weight Gain 33 lb (15 kg) Urinalysis:      Fetal Status: Fetal Heart Rate (bpm): 135 Fundal Height: 31 cm Movement: Present     General:  Alert, oriented and cooperative. Patient is in no acute distress.  Skin: Skin is warm and dry. No rash noted.   Cardiovascular: Normal heart rate noted  Respiratory: Normal respiratory effort, no problems with respiration noted  Abdomen: Soft, gravid, appropriate for gestational age. Pain/Pressure: Absent     Pelvic:  Cervical exam deferred        Extremities: Normal range of motion.     ental Status: Normal mood and affect. Normal behavior. Normal judgment and  thought content.   Date Fasting Breakfast Lunch Dinner  10/08 106 119 114 139  10/09 97 130 90 97  10/10 88 100 112 122  10/11 92 117 105 98  10/12 93 111 124 107  10/13 95 126 115 90  10/14 103       Assessment   28y.o. [id number] 31w4dy  01/16/2019, by Ultrasound presenting for routine prenatal visit  Plan   Pregnancy#6 Problems (from 04/03/18 to present)    Problem Noted Resolved   History of eclampsia 07/22/2017 by StMalachy MoodMD No   Priority:  Medium     Overview Addendum 09/03/2017  5:46 PM by StMalachy MoodMD    _0  Aspirin 81 mg daily after 12 weeks; discontinue after 36 weeks  Baseline and surveillance labs ([facility] needed)  Lab Results  Component Value Date   PLT 292 07/22/2017   CREATININE 0.73 07/22/2017   AST 16 07/22/2017   ALT 13 07/22/2017   PROTCRRATIO 69 07/16/2014           Gestational diabetes 11/03/2018 by StMalachy MoodMD No   Anti-D antibodies present during pregnancy 5/June 22, 2020y StMalachy MoodMD No   Overview Addendum 11/03/2018  2:57 PM by StMalachy MoodMD    Last rhogam from prior pregnancy 02/11/2018 post delivery.  Negative on 7/20 and 9/21.  Rhogam received 9/21      Short interval between pregnancies affecting pregnancy, antepartum 06/02/2018 by StMalachy MoodMD No   History of gestational diabetes 06/02/2018 by StMalachy MoodMD No   Maternal obesity, antepartum 06/02/2018 by StMalachy MoodMD No   Supervision of high risk pregnancy, antepartum 06/02/2018 by StMalachy MoodMD No   Overview Addendum 11/19/2018  8:17 AM by StMalachy MoodMD    Clinic  [facility] Prenatal Labs  Dating 70w2dUKoreaBlood type: --/--/O NEG (12/31 1044)   Genetic Screen NIPS: XX Inheritest negative Antibody:POS (12/31 1044)  Anatomic UKoreaNormal Rubella: 0.97 (06/11 1453) Varicella: Immune  GTT Early 3-hr 90 / 164 / 108 / 74_0  28 weeks 1-hr 210 RPR: Non Reactive (12/31 1000)   Rhogam 11/02/18  HBsAg: Negative (06/11 1453)   TDaP vaccine 11/19/18  Flu Shot: HIV: NON REACTIVE (12/31 1000)   Baby Food Breast                           GBS:  Contraception IUD Pap: 07/22/2017 NIL  CBB     CS/VBAC CS x 2 (repeat 01/05/2019)   Support Person Husband Cody           History of cesarean delivery 02/10/2018 by JWill Bonnet MD No   Rh negative state in antepartum period 08/23/2017 by SMalachy Mood MD No       Gestational age appropriate obstetric precautions including but not limited to vaginal bleeding, contractions, leaking of fluid and fetal movement were reviewed in detail with the patient.    - Increase basal insulin to 20 units qHS - Start NST/AFI weekly next week  Return in about 1 week (around 12/02/2018) for ROB, NST, AFI Linday Rhodes.  AMalachy Mood MD, FMiccoOB/GYN, CUnion Hill-Novel[facility] Hill[facility] 11/25/2018, 10:23 AM

## 2018-11-25 NOTE — Progress Notes (Signed)
ROB- no concerns/flu shot at next visit

## 2018-12-02 ENCOUNTER — Other Ambulatory Visit: Payer: Self-pay | Admitting: Obstetrics and Gynecology

## 2018-12-02 ENCOUNTER — Ambulatory Visit (INDEPENDENT_AMBULATORY_CARE_PROVIDER_SITE_OTHER): Payer: BC Managed Care – PPO | Admitting: Obstetrics and Gynecology

## 2018-12-02 ENCOUNTER — Other Ambulatory Visit: Payer: Self-pay

## 2018-12-02 ENCOUNTER — Ambulatory Visit (INDEPENDENT_AMBULATORY_CARE_PROVIDER_SITE_OTHER): Payer: BC Managed Care – PPO

## 2018-12-02 DIAGNOSIS — Z3A33 33 weeks gestation of pregnancy: Secondary | ICD-10-CM

## 2018-12-02 DIAGNOSIS — O09899 Supervision of other high risk pregnancies, unspecified trimester: Secondary | ICD-10-CM

## 2018-12-02 DIAGNOSIS — O24414 Gestational diabetes mellitus in pregnancy, insulin controlled: Secondary | ICD-10-CM | POA: Diagnosis not present

## 2018-12-02 DIAGNOSIS — O0990 Supervision of high risk pregnancy, unspecified, unspecified trimester: Secondary | ICD-10-CM

## 2018-12-02 DIAGNOSIS — O09293 Supervision of pregnancy with other poor reproductive or obstetric history, third trimester: Secondary | ICD-10-CM

## 2018-12-02 DIAGNOSIS — Z98891 History of uterine scar from previous surgery: Secondary | ICD-10-CM

## 2018-12-02 DIAGNOSIS — O99213 Obesity complicating pregnancy, third trimester: Secondary | ICD-10-CM | POA: Diagnosis not present

## 2018-12-02 DIAGNOSIS — Z8759 Personal history of other complications of pregnancy, childbirth and the puerperium: Secondary | ICD-10-CM

## 2018-12-02 DIAGNOSIS — O34219 Maternal care for unspecified type scar from previous cesarean delivery: Secondary | ICD-10-CM | POA: Diagnosis not present

## 2018-12-02 DIAGNOSIS — O0993 Supervision of high risk pregnancy, unspecified, third trimester: Secondary | ICD-10-CM

## 2018-12-02 DIAGNOSIS — O99210 Obesity complicating pregnancy, unspecified trimester: Secondary | ICD-10-CM

## 2018-12-02 DIAGNOSIS — Z8632 Personal history of gestational diabetes: Secondary | ICD-10-CM

## 2018-12-02 DIAGNOSIS — Z1371 Encounter for nonprocreative screening for genetic disease carrier status: Secondary | ICD-10-CM

## 2018-12-02 NOTE — Progress Notes (Signed)
I connected with Denise Walker on 12/02/18 at 10:50 AM EDT by telephone and verified that I am speaking with the correct person using two identifiers.   I discussed the limitations, risks, security and privacy concerns of performing an evaluation and management service by telephone and the availability of in person appointments. I also discussed with the patient that there may be a patient responsible charge related to this service. The patient expressed understanding and agreed to proceed.  The patient was at home I spoke with the patient from my workstation phoneThe names of people involved in this encounter were: Denise Walker , and [name]   Routine Prenatal Care Visit  Subjective  Denise Walker is a 28 y.o. [id number] at 53w4dbeing seen today for ongoing prenatal care.  She is currently monitored for the following issues for this low-risk pregnancy and has History of eclampsia; Rh negative state in antepartum period; Migraines; History of cesarean delivery; BRCA gene mutation negative; Short interval between pregnancies affecting pregnancy, antepartum; History of gestational diabetes; Maternal obesity, antepartum; Supervision of high risk pregnancy, antepartum; Anti-D antibodies present during pregnancy; and Gestational diabetes on their problem list.  ----------------------------------------------------------------------------------- Patient reports no complaints.    .  .   . Denies leaking of fluid.  ----------------------------------------------------------------------------------- The following portions of the patient's history were reviewed and updated as appropriate: allergies, current medications, past family history, past medical history, past social history, past surgical history and problem list. Problem list updated.   Objective  Last menstrual period 04/03/2018, not currently breastfeeding. Pregravid weight 160 lb (72.6 kg) Total Weight Gain 33 lb (15 kg) Urinalysis:       Fetal Status:           No physical exam as this was a remote telephone visit to promote social distancing during the current COVID-19 Pandemic  Date Fasting Breakfast Lunch Dinner    13-Oct 95 126 115 90    14-Oct 103 98 117 105    15-Oct 91 122 114 127 94 2:00 AM  16-Oct 99 110 101 119 100 4:00 AM  17-Oct 102 98 92 117    18-Oct 96 116 104 138    19-Oct 102 125  142 97 2:00 AM  20-Oct 98 112 15-Apr 99    21-Oct 103          Assessment   28y.o. [id number] 34w4dy  01/16/2019, by Ultrasound presenting for routine prenatal visit  Plan   Pregnancy#6 Problems (from 04/03/18 to present)    Problem Noted Resolved   History of eclampsia 07/22/2017 by StMalachy MoodMD No   Priority:  Medium     Overview Addendum 09/03/2017  5:46 PM by StMalachy MoodMD    _0  Aspirin 81 mg daily after 12 weeks; discontinue after 36 weeks  Baseline and surveillance labs ([facility] as [facility])  Lab Results  Component Value Date   PLT 292 07/22/2017   CREATININE 0.73 07/22/2017   AST 16 07/22/2017   ALT 13 07/22/2017   PROTCRRATIO 69 07/16/2014           Gestational diabetes 11/03/2018 by StMalachy MoodMD No   Anti-D antibodies present during pregnancy 06/2018/06/17y StMalachy MoodMD No   Overview Addendum 11/03/2018  2:57 PM by StMalachy MoodMD    Last rhogam from prior pregnancy 02/11/2018 post delivery.  Negative on 7/20 and 9/21.  Rhogam received 9/21      Short interval between pregnancies affecting  pregnancy, antepartum 06/02/2018 by [name], MD No   History of gestational diabetes 06/02/2018 by [name], MD No   Maternal obesity, antepartum 06/02/2018 by [name], MD No   Supervision of high risk pregnancy, antepartum 06/02/2018 by [name], MD No   Overview Addendum 11/19/2018  8:17 AM by [name], MD    Clinic [facility] Prenatal Labs  Dating 56w2dUKoreaBlood type: --/--/O NEG (12/31 1044)    Genetic Screen NIPS: XX Inheritest negative Antibody:POS (12/31 1044)  Anatomic UKoreaNormal Rubella: 0.97 (06/11 1453) Varicella: Immune  GTT Early 3-hr 90 / 164 / 108 / 74_0  28 weeks 1-hr 210 RPR: Non Reactive (12/31 1000)   Rhogam 11/02/18 HBsAg: Negative (06/11 1453)   TDaP vaccine 11/19/18  Flu Shot: HIV: NON REACTIVE (12/31 1000)   Baby Food Breast                           GBS:  Contraception IUD Pap: 07/22/2017 NIL  CBB     CS/VBAC CS x 2 (repeat 01/05/2019)   Support Person Husband Cody           History of cesarean delivery 02/10/2018 by JWill Bonnet MD No   Rh negative state in antepartum period 08/23/2017 by SMalachy Mood MD No       Gestational age appropriate obstetric precautions including but not limited to vaginal bleeding, contractions, leaking of fluid and fetal movement were reviewed in detail with the patient.    - Growth appropriate normal AFI - increase long acting (glargine) to 24 units, increase meal time (aspart) to 6 units - Telephone time 11:52 minutes  Return in about 1 week (around 12/09/2018) for ROB, NST, AFI.  AMalachy Mood MD, FLoura PardonOB/GYN, CBunker Hill VillageGroup 12/02/2018, 12:37 PM

## 2018-12-09 ENCOUNTER — Other Ambulatory Visit: Payer: Self-pay

## 2018-12-09 ENCOUNTER — Ambulatory Visit (INDEPENDENT_AMBULATORY_CARE_PROVIDER_SITE_OTHER): Payer: BC Managed Care – PPO

## 2018-12-09 ENCOUNTER — Ambulatory Visit (INDEPENDENT_AMBULATORY_CARE_PROVIDER_SITE_OTHER): Payer: BC Managed Care – PPO | Admitting: Obstetrics and Gynecology

## 2018-12-09 VITALS — BP 110/70 | Wt 194.0 lb

## 2018-12-09 DIAGNOSIS — O09293 Supervision of pregnancy with other poor reproductive or obstetric history, third trimester: Secondary | ICD-10-CM

## 2018-12-09 DIAGNOSIS — O09899 Supervision of other high risk pregnancies, unspecified trimester: Secondary | ICD-10-CM

## 2018-12-09 DIAGNOSIS — O26893 Other specified pregnancy related conditions, third trimester: Secondary | ICD-10-CM | POA: Diagnosis not present

## 2018-12-09 DIAGNOSIS — Z8759 Personal history of other complications of pregnancy, childbirth and the puerperium: Secondary | ICD-10-CM

## 2018-12-09 DIAGNOSIS — O34219 Maternal care for unspecified type scar from previous cesarean delivery: Secondary | ICD-10-CM

## 2018-12-09 DIAGNOSIS — O0990 Supervision of high risk pregnancy, unspecified, unspecified trimester: Secondary | ICD-10-CM

## 2018-12-09 DIAGNOSIS — O24414 Gestational diabetes mellitus in pregnancy, insulin controlled: Secondary | ICD-10-CM

## 2018-12-09 DIAGNOSIS — Z98891 History of uterine scar from previous surgery: Secondary | ICD-10-CM

## 2018-12-09 DIAGNOSIS — Z3A34 34 weeks gestation of pregnancy: Secondary | ICD-10-CM

## 2018-12-09 DIAGNOSIS — O99210 Obesity complicating pregnancy, unspecified trimester: Secondary | ICD-10-CM

## 2018-12-09 DIAGNOSIS — Z8632 Personal history of gestational diabetes: Secondary | ICD-10-CM

## 2018-12-09 DIAGNOSIS — Z6791 Unspecified blood type, Rh negative: Secondary | ICD-10-CM

## 2018-12-09 DIAGNOSIS — O0993 Supervision of high risk pregnancy, unspecified, third trimester: Secondary | ICD-10-CM

## 2018-12-09 DIAGNOSIS — O26899 Other specified pregnancy related conditions, unspecified trimester: Secondary | ICD-10-CM

## 2018-12-09 DIAGNOSIS — O99213 Obesity complicating pregnancy, third trimester: Secondary | ICD-10-CM

## 2018-12-09 NOTE — Progress Notes (Signed)
ROB/AFI C/o pain Denies lof, no vb Good FM

## 2018-12-09 NOTE — Progress Notes (Signed)
Routine Prenatal Care Visit  Subjective  Denise Walker is a 28 y.o. [id number] at 27w4dbeing seen today for ongoing prenatal care.  She is currently monitored for the following issues for this high-risk pregnancy and has History of eclampsia; Rh negative state in antepartum period; Migraines; History of cesarean delivery; BRCA gene mutation negative; Short interval between pregnancies affecting pregnancy, antepartum; History of gestational diabetes; Maternal obesity, antepartum; Supervision of high risk pregnancy, antepartum; Anti-D antibodies present during pregnancy; and Gestational diabetes on their problem list.  ----------------------------------------------------------------------------------- Patient reports no complaints.   Contractions: Not present. Vag. Bleeding: None.  Movement: Present. Denies leaking of fluid.  ----------------------------------------------------------------------------------- The following portions of the patient's history were reviewed and updated as appropriate: allergies, current medications, past family history, past medical history, past social history, past surgical history and problem list. Problem list updated.   Objective  Blood pressure 110/70, weight 194 lb (88 kg), last menstrual period 04/03/2018, not currently breastfeeding. Pregravid weight 160 lb (72.6 kg) Total Weight Gain 34 lb (15.4 kg) Urinalysis:      Fetal Status: Fetal Heart Rate (bpm): 145 Fundal Height: 33 cm Movement: Present     General:  Alert, oriented and cooperative. Patient is in no acute distress.  Skin: Skin is warm and dry. No rash noted.   Cardiovascular: Normal heart rate noted  Respiratory: Normal respiratory effort, no problems with respiration noted  Abdomen: Soft, gravid, appropriate for gestational age. Pain/Pressure: Absent     Pelvic:  Cervical exam deferred        Extremities: Normal range of motion.     ental Status: Normal mood and affect. Normal behavior.  Normal judgment and thought content.   Baseline: 145 Variability: moderate Accelerations: present Decelerations: absent The patient was monitored for 30 minutes, fetal heart rate tracing was deemed reactive, category I tracing,  CPT 59025  [place] Limited  Result Date: 12/09/2018 ULTRASOUND REPORT [facility] Date of Service: 12/09/2018 Indications:AFI Findings: SNelda Marseilleintrauterine pregnancy is visualized with FHR at 163 BPM. Fetal presentation is Cephalic. Placenta: fundal. Grade: 1 AFI: 12.4 cm Impression: 1. 332w4diable Singleton Intrauterine pregnancy dated by previously established criteria. 2. AFI is 12.4 cm. Recommendations: 1.Clinical correlation with the patient's History and Physical Exam. ElGweneth DimitriRT There is a singleton gestation with normal amniotic fluid volume. The visualized fetal anatomical survey appears within normal limits within the resolution of ultrasound as described above.  It must be noted that a normal ultrasound is unable to rule out fetal aneuploidy.  AnMalachy MoodMD, FALoura PardonB/GYN, [facility] 12/09/2018, 10:25 AM   UsKoreab Follow Up  Result Date: 12/02/2018 Patient Name: KeTeneisha GignacOB: 10/1990/06/09RN: [id number] REPORT [facility]: [facility] Date of Service: 12/02/2018 Indications:growth/afi Findings: SiNelda Marseillentrauterine pregnancy is visualized with FHR at 134 BPM. Biometrics give an (U/S) Gestational age of 3284w6dd an (U/S) EDD of 01/21/2019; this correlates with the clinically established Estimated Date of Delivery: 01/16/19. Fetal presentation is Cephalic. Placenta: fundal. Grade: 1 AFI: 12.9 cm Growth percentile is 45.9%. EFW: 2238 g  ( 4 lb 15 oz ) Impression: 1. 33w65w4dble Singleton Intrauterine pregnancy previously established criteria. 2. Growth is 45.9 %ile.  AFI is 12.9 cm. Recommendations: 1.Clinical correlation with the patient's History and Physical Exam. ElysGweneth Dimitri There  is a singleton gestation with normal amniotic fluid volume. The fetal biometry correlates with established dating.  Limited fetal anatomy was performed.The visualized fetal anatomical survey appears  within normal limits within the resolution of ultrasound as described above.  It must be noted that a normal ultrasound is unable to rule out fetal aneuploidy.  [name], MD, [facility], [facility] 12/02/2018, 12:38 PM   Date Fasting Breakfast Lunch Dinner  10/23 91 123 115 157  10/24 104 109 84 159  10/25 102 125 130 118  10/26 99 110 96 127  10/27 103 119 107 150  10/28 104 120 136 128  10/39 100       Assessment   28 y.o. F6O1308 at 32w4dby  01/16/2019, by Ultrasound presenting for routine prenatal visit  Plan   Pregnancy#6 Problems (from 04/03/18 to present)    Problem Noted Resolved   History of eclampsia 07/22/2017 by SMalachy Mood MD No   Priority:  Medium     Overview Addendum 09/03/2017  5:46 PM by SMalachy Mood MD    [X]  Aspirin 81 mg daily after 12 weeks; discontinue after 36 weeks  [facility] and [facility]  Lab Results  Component Value Date   PLT 292 07/22/2017   CREATININE 0.73 07/22/2017   AST 16 07/22/2017   ALT 13 07/22/2017   PROTCRRATIO 69 07/16/2014           Gestational diabetes 11/03/2018 by SMalachy Mood MD No   Overview Signed 12/04/2018 12:30 PM by SMalachy Mood MD    Glargine 16 units Aspart 4 units AC TID Glargine 24 units, Aspart 6 units AC TID 12/02/2018      Anti-D antibodies present during pregnancy [id number] SMalachy Mood MD No   Overview Addendum 11/03/2018  2:57 PM by SMalachy Mood MD    Last rhogam from prior pregnancy 02/11/2018 post delivery.  Negative on 7/20 and 9/21.  Rhogam received 9/21      Short interval between pregnancies affecting pregnancy, antepartum 06/02/2018 by SMalachy Mood MD No   History of gestational  diabetes 06/02/2018 by SMalachy Mood MD No   Maternal obesity, antepartum 06/02/2018 by SMalachy Mood MD No   Supervision of high risk pregnancy, antepartum 06/02/2018 by SMalachy Mood MD No   Overview Addendum 11/19/2018  8:17 AM by SMalachy Mood MD    Clinic [facility] Prenatal Labs  Dating 965w2dSKorealood type: --/--/O NEG (12/31 1044)   Genetic Screen NIPS: XX Inheritest negative Antibody:POS (12/31 1044)  Anatomic USKoreaormal Rubella: 0.97 (06/11 1453) Varicella: Immune  GTT Early 3-hr 90 / 164 / 108 / 74[X]  28 weeks 1-hr 210 RPR: Non Reactive (12/31 1000)   Rhogam 11/02/18 HBsAg: Negative (06/11 1453)   TDaP vaccine 11/19/18  Flu Shot: HIV: NON REACTIVE (12/31 1000)   Baby Food Breast                           GBS:  Contraception IUD Pap: 07/22/2017 NIL  CBB     CS/VBAC CS x 2 (repeat 01/05/2019)   Support Person Husband Cody           History of cesarean delivery 02/10/2018 by JaWill BonnetMD No   Rh negative state in antepartum period 08/23/2017 by StMalachy MoodMD No       Gestational age appropriate obstetric precautions including but not limited to vaginal bleeding, contractions, leaking of fluid and fetal movement were reviewed in detail with the patient.    1) GDM on insulin - Increase glargine to 30U qHS, monitor dinner  time values may need to increase meal time dinner insulin beyond 6 units - continue once weekly NST/AFI and BG check consider increasing antepartum testing to twice weekly at 36 weeks   Return in about 1 week (around 12/16/2018) for ROB, NST, AFI.  [name], MD, [facility], [facility] 12/09/2018, 10:54 AM

## 2018-12-17 ENCOUNTER — Ambulatory Visit (INDEPENDENT_AMBULATORY_CARE_PROVIDER_SITE_OTHER): Payer: BC Managed Care – PPO

## 2018-12-17 ENCOUNTER — Other Ambulatory Visit: Payer: Self-pay

## 2018-12-17 DIAGNOSIS — O99210 Obesity complicating pregnancy, unspecified trimester: Secondary | ICD-10-CM

## 2018-12-17 DIAGNOSIS — Z3689 Encounter for other specified antenatal screening: Secondary | ICD-10-CM

## 2018-12-17 DIAGNOSIS — O0990 Supervision of high risk pregnancy, unspecified, unspecified trimester: Secondary | ICD-10-CM

## 2018-12-17 DIAGNOSIS — Z8759 Personal history of other complications of pregnancy, childbirth and the puerperium: Secondary | ICD-10-CM

## 2018-12-17 DIAGNOSIS — Z98891 History of uterine scar from previous surgery: Secondary | ICD-10-CM

## 2018-12-17 DIAGNOSIS — O09899 Supervision of other high risk pregnancies, unspecified trimester: Secondary | ICD-10-CM

## 2018-12-17 DIAGNOSIS — O24414 Gestational diabetes mellitus in pregnancy, insulin controlled: Secondary | ICD-10-CM

## 2018-12-18 ENCOUNTER — Ambulatory Visit (INDEPENDENT_AMBULATORY_CARE_PROVIDER_SITE_OTHER): Payer: BC Managed Care – PPO | Admitting: Obstetrics and Gynecology

## 2018-12-18 ENCOUNTER — Other Ambulatory Visit: Payer: Self-pay

## 2018-12-18 VITALS — BP 118/68 | Wt 196.0 lb

## 2018-12-18 DIAGNOSIS — Z98891 History of uterine scar from previous surgery: Secondary | ICD-10-CM

## 2018-12-18 DIAGNOSIS — O34219 Maternal care for unspecified type scar from previous cesarean delivery: Secondary | ICD-10-CM

## 2018-12-18 DIAGNOSIS — O99213 Obesity complicating pregnancy, third trimester: Secondary | ICD-10-CM

## 2018-12-18 DIAGNOSIS — O26893 Other specified pregnancy related conditions, third trimester: Secondary | ICD-10-CM

## 2018-12-18 DIAGNOSIS — O26899 Other specified pregnancy related conditions, unspecified trimester: Secondary | ICD-10-CM

## 2018-12-18 DIAGNOSIS — O0990 Supervision of high risk pregnancy, unspecified, unspecified trimester: Secondary | ICD-10-CM

## 2018-12-18 DIAGNOSIS — O99210 Obesity complicating pregnancy, unspecified trimester: Secondary | ICD-10-CM

## 2018-12-18 DIAGNOSIS — Z6791 Unspecified blood type, Rh negative: Secondary | ICD-10-CM

## 2018-12-18 DIAGNOSIS — Z3A35 35 weeks gestation of pregnancy: Secondary | ICD-10-CM | POA: Diagnosis not present

## 2018-12-18 DIAGNOSIS — Z8759 Personal history of other complications of pregnancy, childbirth and the puerperium: Secondary | ICD-10-CM

## 2018-12-18 DIAGNOSIS — O360130 Maternal care for anti-D [Rh] antibodies, third trimester, not applicable or unspecified: Secondary | ICD-10-CM

## 2018-12-18 DIAGNOSIS — O24414 Gestational diabetes mellitus in pregnancy, insulin controlled: Secondary | ICD-10-CM | POA: Diagnosis not present

## 2018-12-18 DIAGNOSIS — O09293 Supervision of pregnancy with other poor reproductive or obstetric history, third trimester: Secondary | ICD-10-CM | POA: Diagnosis not present

## 2018-12-18 DIAGNOSIS — O09899 Supervision of other high risk pregnancies, unspecified trimester: Secondary | ICD-10-CM

## 2018-12-18 DIAGNOSIS — O09893 Supervision of other high risk pregnancies, third trimester: Secondary | ICD-10-CM

## 2018-12-18 DIAGNOSIS — O360190 Maternal care for anti-D [Rh] antibodies, unspecified trimester, not applicable or unspecified: Secondary | ICD-10-CM

## 2018-12-18 NOTE — Progress Notes (Signed)
ROB AFI/NST 

## 2018-12-18 NOTE — Progress Notes (Signed)
Routine Prenatal Care Visit  Subjective  Denise Walker is a 28 y.o. [id number] at 71w6dbeing seen today for ongoing prenatal care.  She is currently monitored for the following issues for this high-risk pregnancy and has History of eclampsia; Rh negative state in antepartum period; Migraines; History of cesarean delivery; BRCA gene mutation negative; Short interval between pregnancies affecting pregnancy, antepartum; History of gestational diabetes; Maternal obesity, antepartum; Supervision of high risk pregnancy, antepartum; Anti-D antibodies present during pregnancy; and Gestational diabetes on their problem list.  ----------------------------------------------------------------------------------- Patient reports no complaints.   Contractions: Not present. Vag. Bleeding: None.  Movement: Present. Denies leaking of fluid.  ----------------------------------------------------------------------------------- The following portions of the patient's history were reviewed and updated as appropriate: allergies, current medications, past family history, past medical history, past social history, past surgical history and problem list. Problem list updated.   Objective  Blood pressure 118/68, weight 196 lb (88.9 kg), last menstrual period 04/03/2018, not currently breastfeeding. Pregravid weight 160 lb (72.6 kg) Total Weight Gain 36 lb (16.3 kg) Urinalysis:      Fetal Status: Fetal Heart Rate (bpm): 145 Fundal Height: 35 cm Movement: Present     General:  Alert, oriented and cooperative. Patient is in no acute distress.  Skin: Skin is warm and dry. No rash noted.   Cardiovascular: Normal heart rate noted  Respiratory: Normal respiratory effort, no problems with respiration noted  Abdomen: Soft, gravid, appropriate for gestational age. Pain/Pressure: Absent     Pelvic:  Cervical exam deferred        Extremities: Normal range of motion.     ental Status: Normal mood and affect. Normal behavior.  Normal judgment and thought content.   [place]Ob Limited  Result Date: 12/18/2018 Patient Name: Denise LichtenwalnerDOB: [birth year]MRN: [id number] REPORT [facility] Date of Service: 12/17/2018 Indications:AFI Findings: SNelda Marseilleintrauterine pregnancy is visualized with FHR at 144 BPM. Fetal presentation is Cephalic. Placenta: anterior. Grade: 1 AFI: 12.3 cm Impression: 1. 358w5diable Singleton Intrauterine pregnancy dated by previously established criteria. 2. AFI is 12.3 cm. Recommendations: 1.Clinical correlation with the patient's History and Physical Exam. ElGweneth DimitriRT Review of ULTRASOUND.    [facility]   Plan of management to be discussed with patient. PaBarnett ApplebaumMD, FALoura Pardonb/Gyn, [facility] 12/18/2018  10:41 AM  UsKoreab Limited  Result Date: 12/09/2018 ULTRASOUND REPORT Location: [facility] Date of Service: 12/09/2018 Indications:AFI Findings: SiNelda Marseillentrauterine pregnancy is visualized with FHR at 163 BPM. Fetal presentation is Cephalic. Placenta: fundal. Grade: 1 AFI: 12.4 cm Impression: 1. 343w4dable Singleton Intrauterine pregnancy dated by previously established criteria. 2. AFI is 12.4 cm. Recommendations: 1.Clinical correlation with the patient's History and Physical Exam. ElyGweneth DimitriT There is a singleton gestation with normal amniotic fluid volume. The visualized fetal anatomical survey appears within normal limits within the resolution of ultrasound as described above.  It must be noted that a normal ultrasound is unable to rule out fetal aneuploidy.  AndMalachy MoodD, FACLoura Pardon/GYN, [facility] 12/09/2018, 10:25 AM   Us [place] Follow Up  Result Date: 12/02/2018 Patient Name: Denise LocustB: [birth year]N: [id number] [facility]PORT [facility] Date of Service: 12/02/2018 Indications:growth/afi Findings:  SinNelda Marseilletrauterine pregnancy is visualized with FHR at 134 BPM. Biometrics give an (U/S) Gestational age of 32w87w6d an (U/S) EDD of 01/21/2019; this correlates with the  clinically established Estimated Date of Delivery: 01/16/19. Fetal presentation is Cephalic. Placenta: fundal. Grade: 1 AFI: 12.9 cm Growth percentile is 45.9%. EFW: 2238 g  ( 4 lb 15 oz ) Impression: 1. 35w4dViable Singleton Intrauterine pregnancy previously established criteria. 2. Growth is 45.9 %ile.  AFI is 12.9 cm. Recommendations: 1.Clinical correlation with the patient's History and Physical Exam. EGweneth Dimitri RT There is a singleton gestation with normal amniotic fluid volume. The fetal biometry correlates with established dating.  Limited fetal anatomy was performed.The visualized fetal anatomical survey appears within normal limits within the resolution of ultrasound as described above.  It must be noted that a normal ultrasound is unable to rule out fetal aneuploidy.  AMalachy Mood MD, [facility], [facility] 12/02/2018, 12:38 PM   Baseline: 145  Variability: moderate Accelerations: present Decelerations: absent The patient was monitored for 30 minutes, fetal heart rate tracing was deemed reactive, category I tracing,  Date Fasting Breaskfast Lunch Dinner  12/14/2018 113 137 132 168  12/15/2018 116 118 120 144  12/16/2018 112 139 130 135  12/17/2018 110 119 121 153  12/18/2018 111 125       Assessment   28y.o. [id number] 348w6dy  01/16/2019, by Ultrasound presenting for routine prenatal visit  Plan   Pregnancy#6 Problems (from 04/03/18 to present)    Problem Noted Resolved   History of eclampsia 07/22/2017 by StMalachy MoodMD No   Priority:  Medium     Overview Addendum 09/03/2017  5:46 PM by StMalachy MoodMD    _0  Aspirin 81 mg daily after 12 weeks; discontinue after 36 weeks  Baseline and surveillance labs [facility])  Lab  Results  Component Value Date   PLT 292 07/22/2017   CREATININE 0.73 07/22/2017   AST 16 07/22/2017   ALT 13 07/22/2017   PROTCRRATIO 69 07/16/2014           Gestational diabetes 11/03/2018 by StMalachy MoodMD No   Overview Addendum 12/11/2018  7:41 PM by StMalachy MoodMD    Glargine 16 units Aspart 4 units AC TID Glargine 24 units, Aspart 6 units AC TID 12/02/2018 Glargine 30 units, Aspart 6 units AC TID 12/11/2018      Anti-D antibodies present during pregnancy 06/15/29/20y StMalachy MoodMD No   Overview Addendum 11/03/2018  2:57 PM by StMalachy MoodMD    Last rhogam from prior pregnancy 02/11/2018 post delivery.  Negative on 7/20 and 9/21.  Rhogam received 9/21      Short interval between pregnancies affecting pregnancy, antepartum 06/02/2018 by StMalachy MoodMD No   History of gestational diabetes 06/02/2018 by StMalachy MoodMD No   Maternal obesity, antepartum 06/02/2018 by StMalachy MoodMD No   Supervision of high risk pregnancy, antepartum 06/02/2018 by StMalachy MoodMD No   Overview Addendum 11/19/2018  8:17 AM by StMalachy MoodMDGolden Valleyrenatal Labs  Dating 9w65w2d Koreaood type: --/--/O NEG (12/31 1044)   Genetic Screen NIPS: XX Inheritest negative Antibody:POS (12/31 1044)  Anatomic US Korearmal Rubella: 0.97 (06/11 1453) Varicella: Immune  GTT Early 3-hr 90 / 164 / 108 / 74_1  28 weeks 1-hr 210 RPR: Non Reactive (12/31 1000)   Rhogam 11/02/18 HBsAg: Negative (06/11 1453)   TDaP vaccine 11/19/18  Flu Shot: HIV: NON REACTIVE (12/31 1000)   Baby Food Breast  GBS:  Contraception IUD Pap: 07/22/2017 NIL  CBB     CS/VBAC CS x 2 (repeat 01/05/2019)   Support Person Husband Cody           History of cesarean delivery 02/10/2018 by Will Bonnet, MD No   Rh negative state in antepartum period 08/23/2017 by [name], MD No       Gestational age appropriate obstetric precautions  including but not limited to vaginal bleeding, contractions, leaking of fluid and fetal movement were reviewed in detail with the patient.    - increase mealtime dinner coverage to 10 units, breakfast and lunch still at 6 units.  Evening long acting 40units. - cut out carbohydrate snack before bedtime - If insulin requirement continues to increase consider early delivery at 37-38 weeks  Return in about 1 week (around 12/25/2018) for ROB, NST, AFU.  [name], MD, [facility], [place]

## 2018-12-22 ENCOUNTER — Ambulatory Visit (INDEPENDENT_AMBULATORY_CARE_PROVIDER_SITE_OTHER): Payer: BC Managed Care – PPO

## 2018-12-22 ENCOUNTER — Ambulatory Visit (INDEPENDENT_AMBULATORY_CARE_PROVIDER_SITE_OTHER): Payer: BC Managed Care – PPO | Admitting: Obstetrics and Gynecology

## 2018-12-22 ENCOUNTER — Other Ambulatory Visit: Payer: Self-pay

## 2018-12-22 VITALS — BP 104/60 | Wt 197.0 lb

## 2018-12-22 DIAGNOSIS — O0990 Supervision of high risk pregnancy, unspecified, unspecified trimester: Secondary | ICD-10-CM | POA: Diagnosis not present

## 2018-12-22 DIAGNOSIS — O26893 Other specified pregnancy related conditions, third trimester: Secondary | ICD-10-CM

## 2018-12-22 DIAGNOSIS — Z6791 Unspecified blood type, Rh negative: Secondary | ICD-10-CM

## 2018-12-22 DIAGNOSIS — O09293 Supervision of pregnancy with other poor reproductive or obstetric history, third trimester: Secondary | ICD-10-CM

## 2018-12-22 DIAGNOSIS — Z8632 Personal history of gestational diabetes: Secondary | ICD-10-CM

## 2018-12-22 DIAGNOSIS — Z3A36 36 weeks gestation of pregnancy: Secondary | ICD-10-CM

## 2018-12-22 DIAGNOSIS — O26899 Other specified pregnancy related conditions, unspecified trimester: Secondary | ICD-10-CM

## 2018-12-22 DIAGNOSIS — O09899 Supervision of other high risk pregnancies, unspecified trimester: Secondary | ICD-10-CM

## 2018-12-22 DIAGNOSIS — Z362 Encounter for other antenatal screening follow-up: Secondary | ICD-10-CM | POA: Diagnosis not present

## 2018-12-22 DIAGNOSIS — O99210 Obesity complicating pregnancy, unspecified trimester: Secondary | ICD-10-CM

## 2018-12-22 DIAGNOSIS — O34219 Maternal care for unspecified type scar from previous cesarean delivery: Secondary | ICD-10-CM

## 2018-12-22 DIAGNOSIS — Z8759 Personal history of other complications of pregnancy, childbirth and the puerperium: Secondary | ICD-10-CM

## 2018-12-22 DIAGNOSIS — O24414 Gestational diabetes mellitus in pregnancy, insulin controlled: Secondary | ICD-10-CM

## 2018-12-22 DIAGNOSIS — O99213 Obesity complicating pregnancy, third trimester: Secondary | ICD-10-CM

## 2018-12-22 DIAGNOSIS — Z3685 Encounter for antenatal screening for Streptococcus B: Secondary | ICD-10-CM

## 2018-12-22 DIAGNOSIS — O0993 Supervision of high risk pregnancy, unspecified, third trimester: Secondary | ICD-10-CM

## 2018-12-22 DIAGNOSIS — O360130 Maternal care for anti-D [Rh] antibodies, third trimester, not applicable or unspecified: Secondary | ICD-10-CM | POA: Diagnosis not present

## 2018-12-22 DIAGNOSIS — Z98891 History of uterine scar from previous surgery: Secondary | ICD-10-CM

## 2018-12-22 DIAGNOSIS — O360190 Maternal care for anti-D [Rh] antibodies, unspecified trimester, not applicable or unspecified: Secondary | ICD-10-CM

## 2018-12-22 NOTE — Progress Notes (Signed)
ROB AFI/NST GBS 

## 2018-12-22 NOTE — Progress Notes (Signed)
Routine Prenatal Care Visit  Subjective  Denise Walker is a 28 y.o. [id number] at 79w3dbeing seen today for ongoing prenatal care.  She is currently monitored for the following issues for this high-risk pregnancy and has History of eclampsia; Rh negative state in antepartum period; Migraines; History of cesarean delivery; BRCA gene mutation negative; Short interval between pregnancies affecting pregnancy, antepartum; History of gestational diabetes; Maternal obesity, antepartum; Supervision of high risk pregnancy, antepartum; Anti-D antibodies present during pregnancy; and Gestational diabetes on their problem list.  ----------------------------------------------------------------------------------- Patient reports no complaints.   Contractions: Not present. Vag. Bleeding: None.  Movement: Present. Denies leaking of fluid.  ----------------------------------------------------------------------------------- The following portions of the patient's history were reviewed and updated as appropriate: allergies, current medications, past family history, past medical history, past social history, past surgical history and problem list. Problem list updated.   Objective  Blood pressure 104/60, weight 197 lb (89.4 kg), last menstrual period 04/03/2018, not currently breastfeeding. Pregravid weight 160 lb (72.6 kg) Total Weight Gain 37 lb (16.8 kg) Urinalysis:      Fetal Status: Fetal Heart Rate (bpm): 145   Movement: Present  Presentation: Vertex  General:  Alert, oriented and cooperative. Patient is in no acute distress.  Skin: Skin is warm and dry. No rash noted.   Cardiovascular: Normal heart rate noted  Respiratory: Normal respiratory effort, no problems with respiration noted  Abdomen: Soft, gravid, appropriate for gestational age. Pain/Pressure: Absent     Pelvic:  Cervical exam performed Dilation: Fingertip Effacement (%): 50 Station: -3  Extremities: Normal range of motion.     ental  Status: Normal mood and affect. Normal behavior. Normal judgment and thought content.   [place] Limited  Result Date: 12/18/2018 Patient Name: Denise ContinoDOB: 9Jan 04, 1992MRN: [id number] [facility] Date of Service: 12/17/2018 Indications:AFI Findings: SNelda Marseilleintrauterine pregnancy is visualized with FHR at 144 BPM. Fetal presentation is Cephalic. Placenta: anterior. Grade: 1 AFI: 12.3 cm Impression: 1. 33w5diable Singleton Intrauterine pregnancy dated by previously established criteria. 2. AFI is 12.3 cm. Recommendations: 1.Clinical correlation with the patient's History and Physical Exam. ElGweneth DimitriRT Review of ULTRASOUND.    [facility] images and [facility]   Plan of management to be discussed with patient. PaBarnett ApplebaumMD, FALoura Pardonb/Gyn, [facility] 12/18/2018  10:41 AM  Us[place] Limited  Result Date: 12/09/2018 ULTRASOUND REPORT Location: [facility] Date of Service: 12/09/2018 Indications:AFI Findings: SiNelda Marseillentrauterine pregnancy is visualized with FHR at 163 BPM. Fetal presentation is Cephalic. Placenta: fundal. Grade: 1 AFI: 12.4 cm Impression: 1. 3475w4dable Singleton Intrauterine pregnancy dated by previously established criteria. 2. AFI is 12.4 cm. Recommendations: 1.Clinical correlation with the patient's History and Physical Exam. ElyGweneth DimitriT There is a singleton gestation with normal amniotic fluid volume. The visualized fetal anatomical survey appears within normal limits within the resolution of ultrasound as described above.  It must be noted that a normal ultrasound is unable to rule out fetal aneuploidy.  AndMalachy MoodD, FACLoura Pardon/GYN, Con[facility]ettown[facility] 12/09/2018, 10:25 AM   Us [place] Follow Up  Result Date: 12/22/2018 Patient Name: KelPeja AllenderB: [birth year]N: [id number] [facility]PORT [facility] Date of  Service: 12/22/2018 Indications:AFI Findings: SinNelda Marseilletrauterine pregnancy is visualized with FHR at 162 BPM. Fetal presentation is Cephalic. Placenta: fundal. Grade: 1 AFI: 15.1 cm Impression: 1. 36w41w3dble Singleton Intrauterine pregnancy dated  by previously established criteria. 2. AFI is 15.1 cm. Recommendations: 1.Clinical correlation with the patient's History and Physical Exam. Gweneth Dimitri, RT There is a singleton gestation with normal amniotic fluid volume. The visualized fetal anatomical survey appears within normal limits within the resolution of ultrasound as described above.  It must be noted that a normal ultrasound is unable to rule out fetal aneuploidy.  [name], MD, [facility], [facility] 12/22/2018, 11:28 AM   US Ob Follow Up  Result Date: 12/02/2018 Patient Name: Denise Walker DOB: September 26, 1990 MRN: [id number] ULTRASOUND REPORT Location: [facility] Date of Service: 12/02/2018 Indications:growth/afi Findings: Denise Walker intrauterine pregnancy is visualized with FHR at 134 BPM. Biometrics give an (U/S) Gestational age of 55w6dand an (U/S) EDD of 01/21/2019; this correlates with the clinically established Estimated Date of Delivery: 01/16/19. Fetal presentation is Cephalic. Placenta: fundal. Grade: 1 AFI: 12.9 cm Growth percentile is 45.9%. EFW: 2238 g  ( 4 lb 15 oz ) Impression: 1. 329w4diable Singleton Intrauterine pregnancy previously established criteria. 2. Growth is 45.9 %ile.  AFI is 12.9 cm. Recommendations: 1.Clinical correlation with the patient's History and Physical Exam. ElGweneth DimitriRT There is a singleton gestation with normal amniotic fluid volume. The fetal biometry correlates with established dating.  Limited fetal anatomy was performed.The visualized fetal anatomical survey appears within normal limits within the resolution of ultrasound as described above.  It must be noted that a normal ultrasound is unable to rule out  fetal aneuploidy.  AnMalachy MoodMD, FAHidalgoB/GYN, CoHardwickroup 12/02/2018, 12:38 PM   Baseline: 145  Variability: moderate Accelerations: present Decelerations: absent The patient was monitored for 30 minutes, fetal heart rate tracing was deemed Reactive, category I tracing,  Date Fasting Breakfast Lunch Dinner  11/7 100 119 135 152  11/8 104 116 125 169  11/9 112 142 156 153  11/10 109       Assessment   [age].o. [id number] 3617w3d  01/16/2019, by Ultrasound presenting for routine prenatal visit  Plan   Pregnancy#6 Problems (from 04/03/18 to present)    Problem Noted Resolved   History of eclampsia 07/22/2017 by StaMalachy MoodD No   Priority:  Medium     Overview Addendum 09/03/2017  5:46 PM by StaMalachy MoodD    [X]  Aspirin 81 mg daily after 12 weeks; discontinue after 36 weeks  Baseline and surveillance labs ([facility] as [facility])  Lab Results  Component Value Date   PLT 292 07/22/2017   CREATININE 0.73 07/22/2017   AST 16 07/22/2017   ALT 13 07/22/2017   PROTCRRATIO 69 07/16/2014           Gestational diabetes 11/03/2018 by StaMalachy MoodD No   Overview Addendum 12/11/2018  7:41 PM by StaMalachy MoodD    Glargine 16 units Aspart 4 units AC TID Glargine 24 units, Aspart 6 units AC TID 12/02/2018 Glargine 30 units, Aspart 6 units AC TID 12/11/2018      Anti-D antibodies present during pregnancy 5/2Jun 24, 2020 StaMalachy MoodD No   Overview Addendum 11/03/2018  2:57 PM by StaMalachy MoodD    Last rhogam from prior pregnancy 02/11/2018 post delivery.  Negative on 7/20 and 9/21.  Rhogam received 9/21      Short interval between pregnancies affecting pregnancy, antepartum 06/02/2018 by StaMalachy MoodD No   History of gestational diabetes 06/02/2018 by StaMalachy MoodD No   Maternal obesity,  antepartum 06/02/2018 by [name], MD No   Supervision of high risk pregnancy,  antepartum 06/02/2018 by [name], MD No   Overview Addendum 11/19/2018  8:17 AM by [name], MD    [facility] Prenatal Labs  Dating 30w2dUKoreaBlood type: --/--/O NEG (12/31 1044)   Genetic Screen NIPS: XX Inheritest negative Antibody:POS (12/31 1044)  Anatomic UKoreaNormal Rubella: 0.97 (06/11 1453) Varicella: Immune  GTT Early 3-hr 90 / 164 / 108 / 74[X]  28 weeks 1-hr 210 RPR: Non Reactive (12/31 1000)   Rhogam 11/02/18 HBsAg: Negative (06/11 1453)   TDaP vaccine 11/19/18  Flu Shot: HIV: NON REACTIVE (12/31 1000)   Baby Food Breast                           GBS:  Contraception IUD Pap: 07/22/2017 NIL  CBB     CS/VBAC CS x 2 (repeat 01/05/2019)   Support Person Husband Cody           History of cesarean delivery 02/10/2018 by JWill Bonnet MD No   Rh negative state in antepartum period 08/23/2017 by SMalachy Mood MD No       Gestational age appropriate obstetric precautions including but not limited to vaginal bleeding, contractions, leaking of fluid and fetal movement were reviewed in detail with the patient.    Increase long acting 46 units, dinner 12 units, breakfast lunch 8 and 8 Given increasing insulin requirement and worsening blood glucose control discussed moving C-section up to next week.  Rescheduled for 12/31/2018.  Will obtain on last growth scan next week.  GBS collected today  Return in about 3 days (around 12/25/2018) for 11/13 ROB, NST and 11/17 ROB NST, growth.  AMalachy Mood MD, FNarrowsOB/GYN, [facility] 12/22/2018, 12:54 PM

## 2018-12-23 ENCOUNTER — Telehealth: Payer: Self-pay | Admitting: Obstetrics and Gynecology

## 2018-12-23 NOTE — Telephone Encounter (Signed)
-----   Message from [name], MD sent at 12/22/2018 12:45 PM EST ----- Regarding: Surgery move So this patient is currently on for 11/24 I believe, however her blood sugars are rising despite increasing insulin therapy so I've moved her up to 11/19 L&D is aware OR just needs to get changed and her preop appointment testing needs to be changed

## 2018-12-23 NOTE — Telephone Encounter (Signed)
Lmtrc

## 2018-12-23 NOTE — Telephone Encounter (Signed)
Patient is aware H&P is still on 11/16, in-person Pre-admit testing on 11/17 @ 9:30am w/ COVID testing afterwards, and OR on 12/31/18.

## 2018-12-24 LAB — STREP GP B NAA: Strep Gp B NAA: NEGATIVE

## 2018-12-25 ENCOUNTER — Other Ambulatory Visit: Payer: Self-pay

## 2018-12-25 ENCOUNTER — Encounter: Payer: Self-pay | Admitting: Obstetrics & Gynecology

## 2018-12-25 ENCOUNTER — Ambulatory Visit (INDEPENDENT_AMBULATORY_CARE_PROVIDER_SITE_OTHER): Payer: BC Managed Care – PPO | Admitting: Obstetrics & Gynecology

## 2018-12-25 VITALS — BP 100/60 | Wt 198.0 lb

## 2018-12-25 DIAGNOSIS — Z3A36 36 weeks gestation of pregnancy: Secondary | ICD-10-CM | POA: Diagnosis not present

## 2018-12-25 DIAGNOSIS — O99213 Obesity complicating pregnancy, third trimester: Secondary | ICD-10-CM

## 2018-12-25 DIAGNOSIS — O24414 Gestational diabetes mellitus in pregnancy, insulin controlled: Secondary | ICD-10-CM

## 2018-12-25 DIAGNOSIS — O0990 Supervision of high risk pregnancy, unspecified, unspecified trimester: Secondary | ICD-10-CM

## 2018-12-25 DIAGNOSIS — O99210 Obesity complicating pregnancy, unspecified trimester: Secondary | ICD-10-CM

## 2018-12-25 DIAGNOSIS — O0993 Supervision of high risk pregnancy, unspecified, third trimester: Secondary | ICD-10-CM

## 2018-12-25 NOTE — Progress Notes (Signed)
  Subjective  Fetal Movement? yes Contractions? no Leaking Fluid? no Vaginal Bleeding? no BS mostly >120 after eating    Has Insulin regimen in place    CS changed to next week    Has appt w [place] Monday Objective  BP 100/60   Wt 198 lb (89.8 kg)   LMP 04/03/2018 (Approximate)   BMI 39.99 kg/m  General: NAD Pumonary: no increased work of breathing Abdomen: gravid, non-tender Extremities: no edema Psychiatric: mood appropriate, affect full  A NST procedure was performed with FHR monitoring and a normal baseline established, appropriate time of 20-40 minutes of evaluation, and accels >2 seen w 15x15 characteristics.  Results show a REACTIVE NST.   Assessment  28 y.o. [id number] at [redacted]w[redacted]d by  01/16/2019, by Ultrasound presenting for routine prenatal visit  Plan   Problem List Items Addressed This Visit    Gestational diabetes   Maternal obesity, antepartum   Supervision of high risk pregnancy, antepartum - Primary    APT, CS arrangements Monitoring for labor Insulin, cont regimen as outlined  Barnett Applebaum, MD, [facility], [facility] 12/25/2018  10:51 AM

## 2018-12-28 ENCOUNTER — Other Ambulatory Visit: Payer: BC Managed Care – PPO

## 2018-12-28 ENCOUNTER — Other Ambulatory Visit: Payer: Self-pay

## 2018-12-28 ENCOUNTER — Ambulatory Visit (INDEPENDENT_AMBULATORY_CARE_PROVIDER_SITE_OTHER): Payer: BC Managed Care – PPO | Admitting: Obstetrics and Gynecology

## 2018-12-28 ENCOUNTER — Encounter: Payer: Self-pay | Admitting: Obstetrics and Gynecology

## 2018-12-28 ENCOUNTER — Ambulatory Visit (INDEPENDENT_AMBULATORY_CARE_PROVIDER_SITE_OTHER): Payer: BC Managed Care – PPO

## 2018-12-28 VITALS — BP 118/76 | HR 81 | Ht 59.0 in | Wt 194.0 lb

## 2018-12-28 DIAGNOSIS — O0993 Supervision of high risk pregnancy, unspecified, third trimester: Secondary | ICD-10-CM

## 2018-12-28 DIAGNOSIS — Z1371 Encounter for nonprocreative screening for genetic disease carrier status: Secondary | ICD-10-CM

## 2018-12-28 DIAGNOSIS — Z98891 History of uterine scar from previous surgery: Secondary | ICD-10-CM

## 2018-12-28 DIAGNOSIS — O360130 Maternal care for anti-D [Rh] antibodies, third trimester, not applicable or unspecified: Secondary | ICD-10-CM | POA: Diagnosis not present

## 2018-12-28 DIAGNOSIS — O34219 Maternal care for unspecified type scar from previous cesarean delivery: Secondary | ICD-10-CM | POA: Diagnosis not present

## 2018-12-28 DIAGNOSIS — O99210 Obesity complicating pregnancy, unspecified trimester: Secondary | ICD-10-CM

## 2018-12-28 DIAGNOSIS — O24414 Gestational diabetes mellitus in pregnancy, insulin controlled: Secondary | ICD-10-CM

## 2018-12-28 DIAGNOSIS — O26899 Other specified pregnancy related conditions, unspecified trimester: Secondary | ICD-10-CM

## 2018-12-28 DIAGNOSIS — Z362 Encounter for other antenatal screening follow-up: Secondary | ICD-10-CM

## 2018-12-28 DIAGNOSIS — O09899 Supervision of other high risk pregnancies, unspecified trimester: Secondary | ICD-10-CM

## 2018-12-28 DIAGNOSIS — O09293 Supervision of pregnancy with other poor reproductive or obstetric history, third trimester: Secondary | ICD-10-CM

## 2018-12-28 DIAGNOSIS — O0990 Supervision of high risk pregnancy, unspecified, unspecified trimester: Secondary | ICD-10-CM

## 2018-12-28 DIAGNOSIS — O360190 Maternal care for anti-D [Rh] antibodies, unspecified trimester, not applicable or unspecified: Secondary | ICD-10-CM

## 2018-12-28 DIAGNOSIS — Z6791 Unspecified blood type, Rh negative: Secondary | ICD-10-CM

## 2018-12-28 DIAGNOSIS — Z3A37 37 weeks gestation of pregnancy: Secondary | ICD-10-CM

## 2018-12-28 DIAGNOSIS — O26893 Other specified pregnancy related conditions, third trimester: Secondary | ICD-10-CM | POA: Diagnosis not present

## 2018-12-28 DIAGNOSIS — Z8632 Personal history of gestational diabetes: Secondary | ICD-10-CM

## 2018-12-28 DIAGNOSIS — Z8759 Personal history of other complications of pregnancy, childbirth and the puerperium: Secondary | ICD-10-CM

## 2018-12-28 DIAGNOSIS — O99213 Obesity complicating pregnancy, third trimester: Secondary | ICD-10-CM

## 2018-12-28 NOTE — Progress Notes (Signed)
Obstetric H&P   Chief Complaint: Schedule C-section  Prenatal Care Provider: [facility]  History of Present Illness: 28 y.o. [id number] 56w2dby 01/16/2019, by Ultrasound presenting today for NST and growth scan secondary to insulin dependent GDM, with increasing insulin requirement in the third trimester.  Pregnancy otherwise uncomplicated.  +FM, no LOF, no VB, no contractions.  Prior pregnancies notable for GDM G2, and eclampsia G1, as well as history of postpartum anxiety and depression   Pregravid weight 160 lb (72.6 kg) Total Weight Gain 38 lb (17.2 kg)  Pregnancy#6 Problems (from 04/03/18 to present)    Problem Noted Resolved   History of eclampsia 07/22/2017 by SMalachy Mood MD No   Priority:  Medium     Overview Addendum 09/03/2017  5:46 PM by SMalachy Mood MD    [X]  Aspirin 81 mg daily after 12 weeks; discontinue after 36 weeks  Baseline and surveillance labs [facility] as needed)  Lab Results  Component Value Date   PLT 292 07/22/2017   CREATININE 0.73 07/22/2017   AST 16 07/22/2017   ALT 13 07/22/2017   PROTCRRATIO 69 07/16/2014           Gestational diabetes 11/03/2018 by SMalachy Mood MD No   Overview Addendum 12/22/2018 12:52 PM by SMalachy Mood MD    Glargine 16 units Aspart 4 units AC TID Glargine 24 units, Aspart 6 units AC TID 12/02/2018 Glargine 30 units, Aspart 6 units AC TID 12/11/2018 Glargine 30 units, Aspart 6 / 6 / 10 12/18/2018 Glargine 46 units, Aspart 8 / 8 / 12 12/22/2018      Anti-D antibodies present during pregnancy 506/08/2020by SMalachy Mood MD No   Overview Addendum 11/03/2018  2:57 PM by SMalachy Mood MD    Last rhogam from prior pregnancy 02/11/2018 post delivery.  Negative on 7/20 and 9/21.  Rhogam received 9/21      Short interval between pregnancies affecting pregnancy, antepartum 06/02/2018 by SMalachy Mood MD No   History of gestational diabetes 06/02/2018 by SMalachy Mood MD No     Maternal obesity, antepartum 06/02/2018 by SMalachy Mood MD No   Supervision of high risk pregnancy, antepartum 06/02/2018 by SMalachy Mood MD No   Overview Addendum 11/19/2018  8:17 AM by SMalachy Mood MD    Clinic [facility] Prenatal Labs  Dating 991w2dSKorealood type: --/--/O NEG (12/31 1044)   Genetic Screen NIPS: XX Inheritest negative Antibody:POS (12/31 1044)  Anatomic USKoreaormal Rubella: 0.97 (06/11 1453) Varicella: Immune  GTT Early 3-hr 90 / 164 / 108 / 74[X]  28 weeks 1-hr 210 RPR: Non Reactive (12/31 1000)   Rhogam 11/02/18 HBsAg: Negative (06/11 1453)   TDaP vaccine 11/19/18  Flu Shot: HIV: NON REACTIVE (12/31 1000)   Baby Food Breast                           GBS:  Contraception IUD Pap: 07/22/2017 NIL  CBB     CS/VBAC CS x 2 (repeat 01/05/2019)   Support Person Husband Cody           History of cesarean delivery 02/10/2018 by JaWill BonnetMD No   Rh negative state in antepartum period 08/23/2017 by StMalachy MoodMD No       Review of Systems: 10 point review of systems negative unless otherwise noted in HPI  Past Medical History: Past Medical History:  Diagnosis Date   Anxiety    BRCA negative  04/2018   MyRisk neg; IBIS=8.9%/riskscore=6.7%   Family history of ovarian cancer    Gestational diabetes    Migraine headache    PCOS (polycystic ovarian syndrome)     Past Surgical History: Past Surgical History:  Procedure Laterality Date   CESAREAN SECTION N/A 07/15/2014   Procedure: CESAREAN SECTION;  Surgeon: Gae Dry, MD;  Location: [facility] ORS;  Service: Obstetrics;  Laterality: N/A;   CESAREAN SECTION N/A 02/10/2018   Procedure: CESAREAN SECTION;  Surgeon: Will Bonnet, MD;  Location: [facility] ORS;  Service: Obstetrics;  Laterality: N/A;   HERNIA REPAIR  10/2013   WISDOM TOOTH EXTRACTION      Past Obstetric History: # 1 - Date: 2013, Sex: None, Weight: None, GA: None, Delivery: None, Apgar1: None, Apgar5: None, Living:  None, Birth Comments: None  # 2 - Date: 81, Sex: None, Weight: None, GA: None, Delivery: None, Apgar1: None, Apgar5: None, Living: None, Birth Comments: None  # 3 - Date: 07/15/14, Sex: Female, Weight: 6 lb 2.1 oz (2.78 kg), GA: [redacted]w[redacted]d Delivery: C-Section, Low Vertical, Apgar1: 9, Apgar5: 9, Living: Living, Birth Comments: None  # 4 - Date: 07/22/16, Sex: None, Weight: None, GA: None, Delivery: None, Apgar1: None, Apgar5: None, Living: None, Birth Comments: None  # 5 - Date: 02/10/18, Sex: Female, Weight: 6 lb 8 oz (2.948 kg), GA: 326w2dDelivery: C-Section, Low Transverse, Apgar1: 8, Apgar5: 9, Living: Living, Birth Comments: None  # 6 - Date: None, Sex: None, Weight: None, GA: None, Delivery: None, Apgar1: None, Apgar5: None, Living: None, Birth Comments: None   Past Gynecologic History:  Family History: Family History  Problem Relation Age of Onset   Ovarian cancer Mother    Arthritis Mother    Diabetes Paternal Grandfather    Diabetes Paternal Aunt    Ovarian cancer Maternal Aunt    Breast cancer Maternal Grandmother    Uterine cancer Cousin     Social History: Social History   Socioeconomic History   Marital status: Married    Spouse name: CoTourist information centre manager Number of children: Not on file   Years of education: Not on file   Highest education level: Not on file  Occupational History   Not on file  Social Needs   Financial resource strain: Not hard at all   Food insecurity    Worry: Never true    Inability: Never true   Transportation needs    Medical: No    Non-medical: No  Tobacco Use   Smoking status: Never Smoker   Smokeless tobacco: Never Used  Substance and Sexual Activity   Alcohol use: No   Drug use: Never   Sexual activity: Yes    Birth control/protection: Other-see comments    Comment: will discuss with MD  Lifestyle   Physical activity    Days per week: 0 days    Minutes per session: 0 min   Stress: To some extent  Relationships     Social connections    Talks on phone: More than three times a week    Gets together: Three times a week    Attends religious service: Never    Active member of club or organization: No    Attends meetings of clubs or organizations: Never    Relationship status: Married   Intimate partner violence    Fear of current or ex partner: No    Emotionally abused: No    Physically abused: No    Forced sexual activity: No  Other  Topics Concern   Not on file  Social History Narrative   Not on file    Medications: Prior to Admission medications   Medication Sig Start Date End Date Taking? Authorizing Provider  Accu-Chek FastClix Lancets MISC 1 Units by Percutaneous route 4 (four) times daily. 11/03/18  Yes [name], MD  butalbital-acetaminophen-caffeine (FIORICET) 50-325-40 MG tablet TAKE 1 TABLET BY MOUTH EVERY 4 (FOUR) HOURS AS NEEDED FOR PAIN FOR UP TO 10 DAYS. 05/07/18  Yes [provider]  escitalopram (LEXAPRO) 20 MG tablet Take 1 tablet (20 mg total) by mouth daily. 04/15/18  Yes [name], MD  glucose blood (ACCU-CHEK GUIDE) test strip Use as instructed 11/03/18  Yes [name], MD  hydrOXYzine (ATARAX/VISTARIL) 25 MG tablet Take 1 tablet (25 mg total) by mouth every 6 (six) hours as needed for anxiety. 04/15/18  Yes [name], MD  insulin aspart (NOVOLOG) 100 UNIT/ML FlexPen Inject 4 Units into the skin 3 (three) times daily with meals. 11/18/18  Yes [name], MD  Insulin Glargine (BASAGLAR KWIKPEN) 100 UNIT/ML SOPN Inject 0.16 mLs (16 Units total) into the skin at bedtime. 11/19/18  Yes [name], MD  Insulin Pen Needle (NOVOFINE) 32G X 6 MM MISC 4 Units by Does not apply route daily. 11/20/18  Yes [name], MD  omeprazole (PRILOSEC) 20 MG capsule Take 1 capsule (20 mg total) by mouth daily. 11/02/18  Yes [name], MD  Prenatal Vit-Fe Fumarate-FA (PRENATAL MULTIVITAMIN) TABS tablet Take 1 tablet by mouth daily at 12  noon.   Yes [provider]  prochlorperazine (COMPAZINE) 10 MG tablet Take 1 tablet (10 mg total) by mouth every 6 (six) hours as needed for nausea (headache). 06/17/18  Yes [name], MD  promethazine (PHENERGAN) 12.5 MG tablet TAKE 1 TABLET (12.5 MG TOTAL) BY MOUTH EVERY 6 (SIX) HOURS AS NEEDED FOR NAUSEA FOR UP TO 7 DAYS 05/07/18  Yes [provider]    Allergies: Allergies  Allergen Reactions   Fish Allergy Shortness Of Breath    Physical Exam: Vitals: Blood pressure 118/76, pulse 81, height 4' 11"  (1.499 m), weight 194 lb (88 kg), last menstrual period 04/03/2018, not currently breastfeeding.  Baseline: 140  Variability: moderate Accelerations: present Decelerations: absent The patient was monitored for 30 minutes, fetal heart rate tracing was deemed reactive, category I tracing,  General: NAD HEENT: normocephalic, anicteric Pulmonary: No increased work of breathing Cardiovascular: RRR, distal pulses 2+ Abdomen: Gravid, non-tender Leopolds: vtx Extremities: no edema, erythema, or tenderness Neurologic: Grossly intact Psychiatric: mood appropriate, affect full  Labs: No results found for this or any previous visit (from the past 24 hour(s)).  US Ob Limited  Result Date: 12/18/2018 Patient Name: Denise Walker DOB: 06-May-1990 MRN: [id number] ULTRASOUND REPORT Location: [facility] Date of Service: 12/17/2018 Indications:AFI Findings: Denise Walker intrauterine pregnancy is visualized with FHR at 144 BPM. Fetal presentation is Cephalic. Placenta: anterior. Grade: 1 AFI: 12.3 cm Impression: 1. 35w5dViable Singleton Intrauterine pregnancy dated by previously established criteria. 2. AFI is 12.3 cm. Recommendations: 1.Clinical correlation with the patient's History and Physical Exam. EGweneth Dimitri RT Review of ULTRASOUND.    [facility]    Plan of management to be discussed with  patient. PBarnett Applebaum MD, FLoura PardonOb/Gyn, [facility]EncampmentGroup 12/18/2018  10:41 AM  [place]Ob Limited  Result Date: 12/09/2018 ULTRASOUND REPORT Location: [facility] Date of Service: 12/09/2018 Indications:AFI Findings: SNelda Marseilleintrauterine pregnancy is  visualized with FHR at 163 BPM. Fetal presentation is Cephalic. Placenta: fundal. Grade: 1 AFI: 12.4 cm Impression: 1. 52w4dViable Singleton Intrauterine pregnancy dated by previously established criteria. 2. AFI is 12.4 cm. Recommendations: 1.Clinical correlation with the patient's History and Physical Exam. EGweneth Dimitri RT There is a singleton gestation with normal amniotic fluid volume. The visualized fetal anatomical survey appears within normal limits within the resolution of ultrasound as described above.  It must be noted that a normal ultrasound is unable to rule out fetal aneuploidy.  AMalachy Mood MD, FLoura PardonOB/GYN, CScotiaGroup 12/09/2018, 10:25 AM   [place]Ob Follow Up  Result Date: 12/28/2018 Patient Name: KChistina RostonDOB: [birth year]MRN: [id number] [facility] Date of Service: 12/28/2018 Indications:growth/afi Findings: SNelda Marseilleintrauterine pregnancy is visualized with FHR at 160 BPM. Biometrics give an (U/S) Gestational age of 356w4dnd an (U/S) EDD of 02/04/2019; this correlates with the clinically established Estimated Date of Delivery: 01/16/19. Fetal presentation is Cephalic. Placenta: anterior. Grade: 2 AFI: 16.8 cm Growth percentile is 19.5%. EFW: 2704 g  ( 5 lb 15 oz  ) HC and BPD are less than 2.3%.   Impression: 1. 3739w2dable Singleton Intrauterine pregnancy previously established criteria. 2. Growth is 19.5 %ile.  AFI is 16.8 cm. Recommendations: 1.Clinical correlation with the patient's History and Physical Exam. ElyGweneth DimitriT There is a singleton gestation with normal amniotic fluid volume. The fetal biometry correlates with established dating.   Limited fetal anatomy was performed. The visualized fetal anatomical survey appears within normal limits within the resolution of ultrasound as described above.  It must be noted that a normal ultrasound is unable to rule out fetal aneuploidy.  AndMalachy MoodD, FACLoura Pardon/GYN, ConAtglenoup 12/28/2018, 9:51 AM   Us [place] Follow Up  Result Date: 12/22/2018 Patient Name: KelEmmalyne GiacomoB: [birth year]N: [id number] [facility]PORT [facility] Date of Service: 12/22/2018 Indications:AFI Findings: SinNelda Marseilletrauterine pregnancy is visualized with FHR at 162 BPM. Fetal presentation is Cephalic. Placenta: fundal. Grade: 1 AFI: 15.1 cm Impression: 1. 36w31w3dble Singleton Intrauterine pregnancy dated by previously established criteria. 2. AFI is 15.1 cm. Recommendations: 1.Clinical correlation with the patient's History and Physical Exam. ElysGweneth Dimitri There is a singleton gestation with normal amniotic fluid volume. The visualized fetal anatomical survey appears within normal limits within the resolution of ultrasound as described above.  It must be noted that a normal ultrasound is unable to rule out fetal aneuploidy.  AndrMalachy Mood, FACOLoura PardonGYN, [facility] 12/22/2018, 11:28 AM   Us [place]Follow Up  Result Date: 12/02/2018 Patient Name: KellStachia Walker: 9/20August 23, 1992: [id number] [facility]PORT [facility] Date of Service: 12/02/2018 Indications:growth/afi Findings: SingNelda Marseillerauterine pregnancy is visualized with FHR at 134 BPM. Biometrics give an (U/S) Gestational age of 32w665w6dan (U/S) EDD of 01/21/2019; this correlates with the clinically established Estimated Date of Delivery: 01/16/19. Fetal presentation is Cephalic. Placenta: fundal. Grade: 1 AFI: 12.9 cm Growth percentile is 45.9%. EFW: 2238 g  ( 4 lb 15 oz ) Impression: 1. 71w4d67w4de Singleton Intrauterine pregnancy previously established criteria. 2.  Growth is 45.9 %ile.  AFI is 12.9 cm. Recommendations: 1.Clinical correlation with the patient's History and Physical Exam. Elyse Gweneth Dimitrihere is a singleton gestation with normal amniotic fluid volume. The fetal biometry correlates with established dating.  Limited fetal anatomy was performed.The visualized fetal anatomical survey appears within  normal limits within the resolution of ultrasound as described above.  It must be noted that a normal ultrasound is unable to rule out fetal aneuploidy.  [name], MD, [facility], [facility] 12/02/2018, 12:38 PM     Assessment: 28 y.o. [id number] 1w2dby 01/16/2019, by Ultrasound presenting for scheduled C-section for history of 2 prior cesarean sections and GDM on insulin  Plan: 1) The patient was counseled regarding risk and benefits to proceeding with Cesarean section to expedite delivery.  Risk of cesarean section were discussed including risk of bleeding and need for potential intraoperative or postoperative blood transfusion with a rate of approximately 5% quoted for all Cesarean sections, risk of injury to adjacent organs including but not limited to bowl and bladder, the need for additional surgical procedures to address such injuries, and the risk of infection.    2) Fetus - category I tracing, reactive NST today  3) PNL - Blood type O/--/-- (05/21 0951) / Anti-bodyscreen Negative (09/21 0950) / Rubella 14.90 (05/04 0854) / Varicella Immune / RPR Non Reactive (09/21 0950) / HBsAg Negative (05/04 0854) / HIV Non Reactive (09/21 0950)  / GBS --/[place](11/10 1216)  4) Immunization History -  Immunization History  Administered Date(s) Administered   Influenza,inj,Quad PF,6+ Mos 10/23/2017   MMR 02/02/2018, 02/12/2018   Tdap 11/19/2018    5) Disposition - pending delivery  AMalachy Mood MD, FNew Cassel [facility]or[facility] 12/28/2018, 9:53 AM

## 2018-12-29 ENCOUNTER — Other Ambulatory Visit: Payer: Self-pay

## 2018-12-29 ENCOUNTER — Encounter: Payer: BC Managed Care – PPO | Admitting: Obstetrics and Gynecology

## 2018-12-29 ENCOUNTER — Encounter
Admission: RE | Admit: 2018-12-29 | Discharge: 2018-12-29 | Disposition: A | Discharge status: Home / Self Care | Payer: BC Managed Care – PPO | Source: Ambulatory Visit | Attending: Obstetrics and Gynecology | Admitting: Obstetrics and Gynecology

## 2018-12-29 DIAGNOSIS — Z01812 Encounter for preprocedural laboratory examination: Secondary | ICD-10-CM | POA: Insufficient documentation

## 2018-12-29 DIAGNOSIS — Z20828 Contact with and (suspected) exposure to other viral communicable diseases: Secondary | ICD-10-CM | POA: Diagnosis not present

## 2018-12-29 HISTORY — DX: Eclampsia, unspecified as to time period: O15.9

## 2018-12-29 LAB — SARS CORONAVIRUS 2 (TAT 6-24 HRS): SARS Coronavirus 2: NEGATIVE

## 2018-12-29 NOTE — Patient Instructions (Signed)
Your procedure is scheduled on: Thursday 12/31/18 Report to Emergency Department at 6 am [facility] [phone].  Remember: Instructions that are not followed completely may result in serious medical risk, up to and including death, or upon the discretion of your surgeon and anesthesiologist your surgery may need to be rescheduled.     _X__ 1. Do not eat food after midnight the night before your procedure.                 No gum chewing or hard candies. You may drink clear liquids up to 2 hours                 before you are scheduled to arrive for your surgery- DO not drink clear                 liquids within 2 hours of the start of your surgery.                 Clear Liquids include:  water, apple juice without pulp, clear carbohydrate                 drink such as Clearfast or Gatorade, Black Coffee or Tea (Do not add                 anything to coffee or tea). Diabetics water only  __X__2.  On the morning of surgery brush your teeth with toothpaste and water, you                 may rinse your mouth with mouthwash if you wish.  Do not swallow any              toothpaste of mouthwash.     _X__ 3.  No Alcohol for 24 hours before or after surgery.   _X__ 4.  Do Not Smoke or use e-cigarettes For 24 Hours Prior to Your Surgery.                 Do not use any chewable tobacco products for at least 6 hours prior to                 surgery.  ____  5.  Bring all medications with you on the day of surgery if instructed.   __X__  6.  Notify your doctor if there is any change in your medical condition      (cold, fever, infections).     Do not wear jewelry, make-up, hairpins, clips or nail polish. Do not wear lotions, powders, or perfumes.  Do not shave 48 hours prior to surgery. Men may shave face and neck. Do not bring valuables to the hospital.    [facility] is not responsible for any belongings or valuables.  Contacts, dentures/partials or body piercings may not be worn into surgery.  Bring a case for your contacts, glasses or hearing aids, a denture cup will be supplied. Leave your suitcase in the car. After surgery it may be brought to your room. For patients admitted to the hospital, discharge time is determined by your treatment team.   Patients discharged the day of surgery will not be allowed to drive home.   Please read over the following fact sheets that you were given:   MRSA Information  __X__ Take these medicines the morning of surgery with A SIP OF WATER:    1.   2.   3.   4.  5.  6.  ____ [employer]  Enema (as directed)   __X__ Use CHG Soap/SAGE wipes as directed  ____ Use inhalers on the day of surgery  ____ Stop metformin/Janumet/Farxiga 2 days prior to surgery    __X__ Take 1/2 of usual insulin dose the night before surgery. No insulin the morning          of surgery.   ____ Stop Blood Thinners Coumadin/Plavix/Xarelto/Pleta/Pradaxa/Eliquis/Effient/Aspirin  on   Or contact your Surgeon, Cardiologist or Medical Doctor regarding  ability to stop your blood thinners  __X__ Stop Anti-inflammatories 7 days before surgery such as Advil, Ibuprofen, Motrin,  BC or Goodies Powder, Naprosyn, Naproxen, Aleve, Aspirin    __X__ Stop all herbal supplements, fish oil or vitamin E until after surgery.    ____ Bring C-Pap to the hospital.

## 2018-12-31 ENCOUNTER — Inpatient Hospital Stay: Payer: BC Managed Care – PPO | Admitting: Anesthesiology

## 2018-12-31 ENCOUNTER — Other Ambulatory Visit: Payer: Self-pay

## 2018-12-31 ENCOUNTER — Inpatient Hospital Stay
Admission: RE | Admit: 2018-12-31 | Discharge: 2019-01-03 | DRG: 787 | Disposition: A | Discharge status: Home / Self Care | Payer: BC Managed Care – PPO | Attending: Obstetrics and Gynecology | Admitting: Obstetrics and Gynecology

## 2018-12-31 ENCOUNTER — Encounter
Admission: RE | Disposition: A | Discharge status: Home / Self Care | Payer: Self-pay | Source: Home / Self Care | Attending: Obstetrics and Gynecology

## 2018-12-31 DIAGNOSIS — O34211 Maternal care for low transverse scar from previous cesarean delivery: Secondary | ICD-10-CM | POA: Diagnosis not present

## 2018-12-31 DIAGNOSIS — O9081 Anemia of the puerperium: Secondary | ICD-10-CM | POA: Diagnosis present

## 2018-12-31 DIAGNOSIS — F419 Anxiety disorder, unspecified: Secondary | ICD-10-CM | POA: Diagnosis not present

## 2018-12-31 DIAGNOSIS — E669 Obesity, unspecified: Secondary | ICD-10-CM | POA: Diagnosis present

## 2018-12-31 DIAGNOSIS — Z3A37 37 weeks gestation of pregnancy: Secondary | ICD-10-CM | POA: Diagnosis not present

## 2018-12-31 DIAGNOSIS — O24424 Gestational diabetes mellitus in childbirth, insulin controlled: Secondary | ICD-10-CM | POA: Diagnosis present

## 2018-12-31 DIAGNOSIS — O34219 Maternal care for unspecified type scar from previous cesarean delivery: Secondary | ICD-10-CM | POA: Diagnosis present

## 2018-12-31 DIAGNOSIS — O99214 Obesity complicating childbirth: Secondary | ICD-10-CM | POA: Diagnosis not present

## 2018-12-31 DIAGNOSIS — G43909 Migraine, unspecified, not intractable, without status migrainosus: Secondary | ICD-10-CM | POA: Diagnosis not present

## 2018-12-31 DIAGNOSIS — D62 Acute posthemorrhagic anemia: Secondary | ICD-10-CM | POA: Diagnosis present

## 2018-12-31 DIAGNOSIS — O24419 Gestational diabetes mellitus in pregnancy, unspecified control: Secondary | ICD-10-CM | POA: Diagnosis not present

## 2018-12-31 DIAGNOSIS — Z98891 History of uterine scar from previous surgery: Secondary | ICD-10-CM

## 2018-12-31 LAB — GLUCOSE, CAPILLARY
Glucose-Capillary: 68 mg/dL — ABNORMAL LOW (ref 70–99)
Glucose-Capillary: 127 mg/dL — ABNORMAL HIGH (ref 70–99)
Glucose-Capillary: 202 mg/dL — ABNORMAL HIGH (ref 70–99)
Glucose-Capillary: 148 mg/dL — ABNORMAL HIGH (ref 70–99)
Glucose-Capillary: 143 mg/dL — ABNORMAL HIGH (ref 70–99)

## 2018-12-31 LAB — COMPREHENSIVE METABOLIC PANEL
ALT: 15 U/L (ref 0–44)
AST: 20 U/L (ref 15–41)
Albumin: 3.1 g/dL — ABNORMAL LOW (ref 3.5–5.0)
Alkaline Phosphatase: 208 U/L — ABNORMAL HIGH (ref 38–126)
Anion gap: 13 (ref 5–15)
BUN: 11 mg/dL (ref 6–20)
CO2: 20 mmol/L — ABNORMAL LOW (ref 22–32)
Calcium: 8.9 mg/dL (ref 8.9–10.3)
Chloride: 103 mmol/L (ref 98–111)
Creatinine, Ser: 0.63 mg/dL (ref 0.44–1.00)
GFR calc Af Amer: >60 mL/min (ref >60)
GFR calc non Af Amer: >60 mL/min (ref >60)
Glucose, Bld: 82 mg/dL (ref 70–99)
Potassium: 3.9 mmol/L (ref 3.5–5.1)
Sodium: 136 mmol/L (ref 135–145)
Total Bilirubin: 0.4 mg/dL (ref 0.3–1.2)
Total Protein: 6.7 g/dL (ref 6.5–8.1)

## 2018-12-31 LAB — CHLAMYDIA/NGC RT PCR (ARMC ONLY)
Chlamydia Tr: NOT DETECTED
N gonorrhoeae: NOT DETECTED

## 2018-12-31 LAB — CBC
HCT: 34.4 % — ABNORMAL LOW (ref 36.0–46.0)
Hemoglobin: 11.1 g/dL — ABNORMAL LOW (ref 12.0–15.0)
MCH: 26.1 pg (ref 26.0–34.0)
MCHC: 32.3 g/dL (ref 30.0–36.0)
MCV: 80.9 fL (ref 80.0–100.0)
Platelets: 240 10*3/uL (ref 150–400)
RBC: 4.25 MIL/uL (ref 3.87–5.11)
RDW: 15.4 % (ref 11.5–15.5)
WBC: 9.3 10*3/uL (ref 4.0–10.5)
nRBC: 0 % (ref 0.0–0.2)

## 2018-12-31 SURGERY — Surgical Case
Anesthesia: Spinal

## 2018-12-31 MED ORDER — KETOROLAC TROMETHAMINE 30 MG/ML IJ SOLN
INTRAMUSCULAR | Status: DC | PRN
  Administered 2018-12-31: 30 mg via INTRAVENOUS

## 2018-12-31 MED ORDER — OXYTOCIN 40 UNITS IN NORMAL SALINE INFUSION - SIMPLE MED
INTRAVENOUS | Status: AC
  Filled 2018-12-31: qty 1000

## 2018-12-31 MED ORDER — ONDANSETRON HCL 4 MG/2ML IJ SOLN
INTRAMUSCULAR | Status: AC
  Filled 2018-12-31: qty 2

## 2018-12-31 MED ORDER — CARBOPROST TROMETHAMINE 250 MCG/ML IM SOLN
INTRAMUSCULAR | Status: AC
  Filled 2018-12-31: qty 1

## 2018-12-31 MED ORDER — MENTHOL 3 MG MT LOZG
1.0000 | LOZENGE | OROMUCOSAL | Status: DC | PRN
  Filled 2018-12-31: qty 9

## 2018-12-31 MED ORDER — KETOROLAC TROMETHAMINE 30 MG/ML IJ SOLN
INTRAMUSCULAR | Status: AC
  Filled 2018-12-31: qty 1

## 2018-12-31 MED ORDER — EPHEDRINE SULFATE 50 MG/ML IJ SOLN
INTRAMUSCULAR | Status: DC | PRN
  Administered 2018-12-31: 15 mg via INTRAVENOUS

## 2018-12-31 MED ORDER — INSULIN ASPART 100 UNIT/ML ~~LOC~~ SOLN: 0.0000 [IU] | Freq: Three times a day (TID) | SUBCUTANEOUS | Status: DC

## 2018-12-31 MED ORDER — SENNOSIDES-DOCUSATE SODIUM 8.6-50 MG PO TABS
2.0000 | ORAL_TABLET | ORAL | Status: DC
  Administered 2019-01-01 – 2019-01-03 (×4): 2 via ORAL
  Filled 2018-12-31 (×4): qty 2

## 2018-12-31 MED ORDER — METHYLERGONOVINE MALEATE 0.2 MG/ML IJ SOLN
INTRAMUSCULAR | Status: AC
  Filled 2018-12-31: qty 1

## 2018-12-31 MED ORDER — PROMETHAZINE HCL 25 MG/ML IJ SOLN
INTRAMUSCULAR | Status: DC | PRN
  Administered 2018-12-31: 12.5 mg via INTRAVENOUS

## 2018-12-31 MED ORDER — MISOPROSTOL 200 MCG PO TABS
ORAL_TABLET | ORAL | Status: AC
  Filled 2018-12-31: qty 4

## 2018-12-31 MED ORDER — COCONUT OIL OIL: 1.0000 "application " | TOPICAL_OIL | Status: DC | PRN

## 2018-12-31 MED ORDER — ONDANSETRON HCL 4 MG/2ML IJ SOLN
INTRAMUSCULAR | Status: DC | PRN
  Administered 2018-12-31: 4 mg via INTRAVENOUS

## 2018-12-31 MED ORDER — ESCITALOPRAM OXALATE 10 MG PO TABS
20.0000 mg | ORAL_TABLET | Freq: Every day | ORAL | Status: DC
  Filled 2018-12-31 (×5): qty 2

## 2018-12-31 MED ORDER — CEFAZOLIN SODIUM-DEXTROSE 2-4 GM/100ML-% IV SOLN
2.0000 g | INTRAVENOUS | Status: AC
  Administered 2018-12-31: 2 g via INTRAVENOUS
  Filled 2018-12-31: qty 100

## 2018-12-31 MED ORDER — BUPIVACAINE HCL (PF) 0.5 % IJ SOLN
INTRAMUSCULAR | Status: DC | PRN
  Administered 2018-12-31: 10 mL

## 2018-12-31 MED ORDER — DIPHENHYDRAMINE HCL 25 MG PO CAPS: 25.0000 mg | ORAL_CAPSULE | Freq: Four times a day (QID) | ORAL | Status: DC | PRN

## 2018-12-31 MED ORDER — SODIUM CHLORIDE 0.9 % IV SOLN
INTRAVENOUS | Status: DC | PRN
  Administered 2018-12-31: 20 ug/min via INTRAVENOUS

## 2018-12-31 MED ORDER — DEXTROSE 5 % IV SOLN
INTRAVENOUS | Status: DC | PRN
  Administered 2018-12-31: 08:00:00 via INTRAVENOUS

## 2018-12-31 MED ORDER — BUPIVACAINE HCL (PF) 0.5 % IJ SOLN
INTRAMUSCULAR | Status: AC
  Filled 2018-12-31: qty 30

## 2018-12-31 MED ORDER — ENOXAPARIN SODIUM 40 MG/0.4ML ~~LOC~~ SOLN
40.0000 mg | SUBCUTANEOUS | Status: DC
  Administered 2019-01-01: 40 mg via SUBCUTANEOUS
  Filled 2018-12-31: qty 0.4

## 2018-12-31 MED ORDER — IBUPROFEN 800 MG PO TABS
800.0000 mg | ORAL_TABLET | Freq: Three times a day (TID) | ORAL | Status: DC
  Administered 2019-01-01 – 2019-01-03 (×8): 800 mg via ORAL
  Filled 2018-12-31 (×8): qty 1

## 2018-12-31 MED ORDER — KETOROLAC TROMETHAMINE 30 MG/ML IJ SOLN
30.0000 mg | Freq: Four times a day (QID) | INTRAMUSCULAR | Status: AC
  Administered 2018-12-31 – 2019-01-01 (×3): 30 mg via INTRAVENOUS
  Filled 2018-12-31 (×3): qty 1

## 2018-12-31 MED ORDER — SODIUM CHLORIDE 0.9 % IV SOLN
INTRAVENOUS | Status: DC | PRN
  Administered 2018-12-31: 09:00:00 via INTRAVENOUS

## 2018-12-31 MED ORDER — OXYCODONE-ACETAMINOPHEN 5-325 MG PO TABS: 1.0000 | ORAL_TABLET | ORAL | Status: DC | PRN

## 2018-12-31 MED ORDER — EPHEDRINE SULFATE 50 MG/ML IJ SOLN
INTRAMUSCULAR | Status: AC
  Filled 2018-12-31: qty 1

## 2018-12-31 MED ORDER — PROPOFOL 10 MG/ML IV BOLUS
INTRAVENOUS | Status: AC
  Filled 2018-12-31: qty 20

## 2018-12-31 MED ORDER — FENTANYL CITRATE (PF) 100 MCG/2ML IJ SOLN
INTRAMUSCULAR | Status: DC | PRN
  Administered 2018-12-31 (×2): 50 ug via INTRAVENOUS

## 2018-12-31 MED ORDER — OXYCODONE-ACETAMINOPHEN 5-325 MG PO TABS
1.0000 | ORAL_TABLET | ORAL | Status: DC | PRN
  Administered 2018-12-31: 2 via ORAL
  Administered 2018-12-31: 1 via ORAL
  Administered 2018-12-31 – 2019-01-01 (×5): 2 via ORAL
  Administered 2019-01-02: 1 via ORAL
  Administered 2019-01-02: 2 via ORAL
  Administered 2019-01-02: 1 via ORAL
  Administered 2019-01-02 (×2): 2 via ORAL
  Administered 2019-01-03: 1 via ORAL
  Filled 2018-12-31: qty 1
  Filled 2018-12-31 (×2): qty 2
  Filled 2018-12-31: qty 1
  Filled 2018-12-31 (×4): qty 2
  Filled 2018-12-31 (×2): qty 1
  Filled 2018-12-31 (×3): qty 2

## 2018-12-31 MED ORDER — INSULIN ASPART 100 UNIT/ML ~~LOC~~ SOLN
0.0000 [IU] | Freq: Three times a day (TID) | SUBCUTANEOUS | Status: DC
  Administered 2018-12-31: 2 [IU] via SUBCUTANEOUS
  Administered 2018-12-31: 5 [IU] via SUBCUTANEOUS
  Administered 2018-12-31: 2 [IU] via SUBCUTANEOUS
  Filled 2018-12-31 (×3): qty 1

## 2018-12-31 MED ORDER — SIMETHICONE 80 MG PO CHEW
80.0000 mg | CHEWABLE_TABLET | Freq: Three times a day (TID) | ORAL | Status: DC
  Administered 2018-12-31 – 2019-01-03 (×8): 80 mg via ORAL
  Filled 2018-12-31 (×7): qty 1

## 2018-12-31 MED ORDER — OXYTOCIN 40 UNITS IN NORMAL SALINE INFUSION - SIMPLE MED: 2.5000 [IU]/h | INTRAVENOUS | Status: DC

## 2018-12-31 MED ORDER — LACTATED RINGERS IV SOLN
INTRAVENOUS | Status: DC
  Administered 2018-12-31: 07:00:00 via INTRAVENOUS

## 2018-12-31 MED ORDER — DIBUCAINE (PERIANAL) 1 % EX OINT: 1.0000 "application " | TOPICAL_OINTMENT | CUTANEOUS | Status: DC | PRN

## 2018-12-31 MED ORDER — SOD CITRATE-CITRIC ACID 500-334 MG/5ML PO SOLN
30.0000 mL | ORAL | Status: AC
  Administered 2018-12-31: 30 mL via ORAL
  Filled 2018-12-31: qty 30

## 2018-12-31 MED ORDER — SIMETHICONE 80 MG PO CHEW
80.0000 mg | CHEWABLE_TABLET | ORAL | Status: DC | PRN
  Administered 2018-12-31: 80 mg via ORAL
  Filled 2018-12-31 (×2): qty 1

## 2018-12-31 MED ORDER — SIMETHICONE 80 MG PO CHEW
80.0000 mg | CHEWABLE_TABLET | ORAL | Status: DC
  Administered 2019-01-02: 80 mg via ORAL
  Filled 2018-12-31: qty 1

## 2018-12-31 MED ORDER — LACTATED RINGERS IV SOLN: INTRAVENOUS | Status: DC

## 2018-12-31 MED ORDER — PHENYLEPHRINE HCL (PRESSORS) 10 MG/ML IV SOLN
INTRAVENOUS | Status: AC
  Filled 2018-12-31: qty 1

## 2018-12-31 MED ORDER — BUPIVACAINE HCL 0.5 % IJ SOLN
20.0000 mL | INTRAMUSCULAR | Status: DC
  Filled 2018-12-31: qty 20

## 2018-12-31 MED ORDER — OXYTOCIN 40 UNITS IN NORMAL SALINE INFUSION - SIMPLE MED
INTRAVENOUS | Status: DC | PRN
  Administered 2018-12-31: 40 mL via INTRAVENOUS

## 2018-12-31 MED ORDER — FENTANYL CITRATE (PF) 100 MCG/2ML IJ SOLN
INTRAMUSCULAR | Status: AC
  Filled 2018-12-31: qty 2

## 2018-12-31 MED ORDER — LACTATED RINGERS IV SOLN: Freq: Once | INTRAVENOUS | Status: DC

## 2018-12-31 MED ORDER — PROMETHAZINE HCL 25 MG/ML IJ SOLN
INTRAMUSCULAR | Status: AC
  Filled 2018-12-31: qty 1

## 2018-12-31 MED ORDER — ZOLPIDEM TARTRATE 5 MG PO TABS: 5.0000 mg | ORAL_TABLET | Freq: Every evening | ORAL | Status: DC | PRN

## 2018-12-31 MED ORDER — BUPIVACAINE 0.25 % ON-Q PUMP SINGLE CATH 400 ML
400.0000 mL | INJECTION | Status: DC
  Filled 2018-12-31: qty 400

## 2018-12-31 MED ORDER — ONDANSETRON HCL 4 MG/2ML IJ SOLN
4.0000 mg | Freq: Four times a day (QID) | INTRAMUSCULAR | Status: DC | PRN
  Administered 2018-12-31: 4 mg via INTRAVENOUS
  Filled 2018-12-31: qty 2

## 2018-12-31 MED ORDER — PRENATAL MULTIVITAMIN CH
1.0000 | ORAL_TABLET | Freq: Every day | ORAL | Status: DC
  Administered 2018-12-31 – 2019-01-02 (×3): 1 via ORAL
  Filled 2018-12-31 (×3): qty 1

## 2018-12-31 MED ORDER — WITCH HAZEL-GLYCERIN EX PADS: 1.0000 "application " | MEDICATED_PAD | CUTANEOUS | Status: DC | PRN

## 2018-12-31 SURGICAL SUPPLY — 31 items
BAG COUNTER SPONGE EZ (MISCELLANEOUS) ×2 IMPLANT
CANISTER SUCT 3000ML PPV (MISCELLANEOUS) ×2 IMPLANT
CATH KIT ON-Q SILVERSOAK 5IN (CATHETERS) ×4 IMPLANT
CHLORAPREP W/TINT 26 (MISCELLANEOUS) ×4 IMPLANT
DERMABOND ADVANCED (GAUZE/BANDAGES/DRESSINGS) ×2
DERMABOND ADVANCED .7 DNX12 (GAUZE/BANDAGES/DRESSINGS) ×2 IMPLANT
DRSG OPSITE POSTOP 4X10 (GAUZE/BANDAGES/DRESSINGS) ×2 IMPLANT
DRSG TELFA 3X8 NADH (GAUZE/BANDAGES/DRESSINGS) ×2 IMPLANT
ELECT CAUTERY BLADE 6.4 (BLADE) ×2 IMPLANT
ELECT REM PT RETURN 9FT ADLT (ELECTROSURGICAL) ×2
ELECTRODE REM PT RTRN 9FT ADLT (ELECTROSURGICAL) ×1 IMPLANT
EXTRACTOR VACUUM KIWI (MISCELLANEOUS) ×2 IMPLANT
GAUZE SPONGE 4X4 12PLY STRL (GAUZE/BANDAGES/DRESSINGS) ×2 IMPLANT
GLOVE BIO SURGEON STRL SZ7 (GLOVE) ×6 IMPLANT
GLOVE INDICATOR 7.5 STRL GRN (GLOVE) ×6 IMPLANT
GOWN STRL REUS W/ TWL LRG LVL3 (GOWN DISPOSABLE) ×3 IMPLANT
GOWN STRL REUS W/TWL LRG LVL3 (GOWN DISPOSABLE) ×3
NS IRRIG 1000ML POUR BTL (IV SOLUTION) ×2 IMPLANT
PACK C SECTION AR (MISCELLANEOUS) ×2 IMPLANT
PAD OB MATERNITY 4.3X12.25 (PERSONAL CARE ITEMS) ×2 IMPLANT
PAD PREP 24X41 OB/GYN DISP (PERSONAL CARE ITEMS) ×2 IMPLANT
PENCIL SMOKE ULTRAEVAC 22 CON (MISCELLANEOUS) ×2 IMPLANT
STRIP CLOSURE SKIN 1/2X4 (GAUZE/BANDAGES/DRESSINGS) ×2 IMPLANT
SUT MNCRL 4-0 (SUTURE) ×1
SUT MNCRL 4-0 27XMFL (SUTURE) ×1
SUT MNCRL AB 4-0 PS2 18 (SUTURE) ×2 IMPLANT
SUT PDS AB 1 TP1 96 (SUTURE) IMPLANT
SUT VIC AB 0 CTX 36 (SUTURE) ×2
SUT VIC AB 0 CTX36XBRD ANBCTRL (SUTURE) ×2 IMPLANT
SUT VIC AB 2-0 CT1 36 (SUTURE) ×2 IMPLANT
SUTURE MNCRL 4-0 27XMF (SUTURE) ×1 IMPLANT

## 2018-12-31 NOTE — Transfer of Care (Signed)
Immediate Anesthesia Transfer of Care Note  Patient: Denise Walker  Procedure(s) Performed: CESAREAN SECTION (N/A )  Patient Location:LDR 1  Anesthesia Type:Spinal  Level of Consciousness: awake, alert  and oriented  Airway & Oxygen Therapy: Patient Spontanous Breathing  Post-op Assessment: Report given to RN and Post -op Vital signs reviewed and stable  Post vital signs: Reviewed and stable  Last Vitals:  Vitals Value Taken Time  BP    Temp    Pulse    Resp    SpO2      Last Pain:  Vitals:   12/31/18 0707  TempSrc: Oral  PainSc: 0-No pain         Complications: No apparent anesthesia complications

## 2018-12-31 NOTE — Anesthesia Procedure Notes (Signed)
Spinal  Patient location during procedure: OB Start time: 12/31/2018 8:16 AM End time: 12/31/2018 8:18 AM Staffing Anesthesiologist: Piscitello, Precious Haws, MD Resident/CRNA: Jerrye Noble, CRNA Performed: resident/CRNA  Preanesthetic Checklist Completed: patient identified, site marked, surgical consent, pre-op evaluation, timeout performed, IV checked, risks and benefits discussed and monitors and equipment checked Spinal Block Patient position: sitting Prep: ChloraPrep Patient monitoring: continuous pulse ox and blood pressure Approach: left paramedian Location: L3-4 Injection technique: single-shot Needle Needle type: Pencan  Needle gauge: 24 G Needle length: 10 cm Assessment Sensory level: T3 Additional Notes Neg paresthesia/heme, positive CSF return.

## 2018-12-31 NOTE — Op Note (Signed)
Preoperative Diagnosis: 1) 28 y.o. [id number] at [redacted]w[redacted]d 2) History of prior cesarean section 3) Insulin depended gestational diabetes with worsening glycemic control 4) Obesity Body mass index is 39.18 kg/m.   Postoperative Diagnosis: 1) 28 y.o. [id number] at [redacted]w[redacted]d 2) History of prior cesarean section 3) Insulin depended gestational diabetes with worsening glycemic control 4) Obesity Body mass index is 39.18 kg/m.  Operation Performed: Repeat low transverse C-section via pfannenstiel skin incision  Indiciation:Worsening glycemic control despite escalating insulin therapy, history of 2 prior cesarean section  Anesthesia: Spinal  Primary Surgeon: Vena Austria, MD   Preoperative Antibiotics: 2g Ancef  Estimated Blood Loss:  IV Fluids:  Urine Output::  Drains or Tubes: Foley to gravity drainage, ON-Q catheter system  Implants: none  Specimens Removed: none  Complications: none  Intraoperative Findings:  Normal tubes ovaries and uterus.  Delivery resulted in the birth of a liveborn female, APGAR (1 MIN): 8  APGAR (5 MINS): 9, weight pending  Patient Condition:stable  Procedure in Detail:  Patient was taken to the operating room were she was administered regional anesthesia.  She was positioned in the supine position, prepped and draped in the  Usual sterile fashion.  Prior to proceeding with the case a time out was performed and the level of anesthetic was checked and noted to be adequate.  Utilizing the scalpel a pfannenstiel skin incision was made 2cm above the pubic symphysis utilizing the patient's pre-existing scar and carried down sharply to the the level of the rectus fascia.  The fascia was incised in the midline using the scalpel and then extended using mayo scissors.  The superior border of the rectus fascia was grasped with two Kocher clamps and the underlying rectus muscles were dissected of the fascia using blunt dissection.  The median raphae was  incised using Mayo scissors.   The inferior border of the rectus fascia was dissected of the rectus muscles in a similar fashion.  The midline was identified, the peritoneum was entered bluntly and expanded using manual tractions.  The uterus was noted to be in a none rotated position.  Next the bladder blade was placed retracting the bladder caudally.  A bladder flap was not created.  A low transverse incision was scored on the lower uterine segment.  The hysterotomy was entered bluntly using the operators finger.  The hysterotomy incision was extended using manual traction.  The operators hand was placed within the hysterotomy position noting the fetus to be within the OA position.  The vertex was not well applied and a flat kiwi vacuum was applied to facilitate delivery of the fetal head. A loose nuchal cord was noted and reduced.  The remainder of the body delivered with ease.  The infant was suctioned, cord was clamped and cut before handing off to the awaiting neonatologist.  The placenta was delivered using manual extraction.  The uterus was exteriorized, wiped clean of clots and debris using two moist laps.  The hysterotomy was closed using a two layer closure of 0 Vicryl, with the first being a running locked, the second a vertical imbricating.  The uterus was returned to the abdomen.  The peritoneal gutters were wiped clean of clots and debris using two moist laps.  The hysterotomy incision was re-inspected noted to be hemostatic.  The rectus muscles were re-approximated in the midline using a single 2-0 Vicryl mattress stitch.  The rectus muscles were inspected noted to be hemostatic.  The superior border of the rectus fascia  was grasped with a Kocher clamp.  The ON-Q trocars were then placed 4cm above the superior border of the incision and tunneled subfascially.  The introducers were removed and the catheters were threaded through the sleeves after which the sleeves were removed.  The fascia was  closed using a looped #1 PDS in a running fashion taking 1cm by 1cm bites.  The subcutaneous tissue was irrigated using warm saline, hemostasis achieved using the bovie.  The subcutaneous dead space was less than 3cm and was not closed.  The skin was closed using 4-0 Monocryl in a subcuticular fashion.  Sponge needle and instrument counts were corrects times two.  The patient tolerated the procedure well and was taken to the recovery room in stable condition.

## 2018-12-31 NOTE — Anesthesia Preprocedure Evaluation (Addendum)
Anesthesia Evaluation  Patient identified by MRN, date of birth, ID band Patient awake    Reviewed: Allergy & Precautions, H&P , NPO status , Patient's Chart, lab work & pertinent test results  History of Anesthesia Complications Negative for: history of anesthetic complications  Airway Mallampati: II  TM Distance: >3 FB Neck ROM: full    Dental  (+) Chipped   Pulmonary neg pulmonary ROS, neg shortness of breath,           Cardiovascular Exercise Tolerance: Good (-) angina(-) DOE      Neuro/Psych  Headaches, Seizures -, Well Controlled,  PSYCHIATRIC DISORDERS    GI/Hepatic negative GI ROS,   Endo/Other  diabetes, Insulin Dependent  Renal/GU      Musculoskeletal   Abdominal   Peds  Hematology negative hematology ROS (+)   Anesthesia Other Findings Past Medical History: No date: Anxiety 04/2018: BRCA negative     Comment:  MyRisk neg; IBIS=8.9%/riskscore=6.7% No date: Eclampsia No date: Family history of ovarian cancer No date: Gestational diabetes No date: Migraine headache No date: PCOS (polycystic ovarian syndrome)  Past Surgical History: 07/15/2014: CESAREAN SECTION; N/A     Comment:  Procedure: CESAREAN SECTION;  Surgeon: Gae Dry,               MD;  Location: [facility] ORS;  Service: Obstetrics;                Laterality: N/A; 02/10/2018: CESAREAN SECTION; N/A     Comment:  Procedure: CESAREAN SECTION;  Surgeon: Will Bonnet, MD;  Location: [facility] ORS;  Service: Obstetrics;                Laterality: N/A; 10/2013: HERNIA REPAIR No date: WISDOM TOOTH EXTRACTION  BMI    Body Mass Index: 39.18 kg/m      Reproductive/Obstetrics (+) Pregnancy                            Anesthesia Physical Anesthesia Plan  ASA: III  Anesthesia Plan: Spinal   Post-op Pain Management:    Induction:   PONV Risk Score and Plan:   Airway Management Planned: Natural  Airway and Nasal Cannula  Additional Equipment:   Intra-op Plan:   Post-operative Plan:   Informed Consent: I have reviewed the patients History and Physical, chart, labs and discussed the procedure including the risks, benefits and alternatives for the proposed anesthesia with the patient or authorized representative who has indicated his/her understanding and acceptance.     Dental Advisory Given  Plan Discussed with: Anesthesiologist, CRNA and Surgeon  Anesthesia Plan Comments: (Patient reports no bleeding problems and no anticoagulant use.  Patient reports extreme pruritis that required treatment with IV diphenhydramine during last C section so plan to avoid spinal narcotics   Plan for spinal with backup GA  Patient consented for risks of anesthesia including but not limited to:  - adverse reactions to medications - risk of bleeding, infection, nerve damage and headache - risk of failed spinal - damage to teeth, lips or other oral mucosa - sore throat or hoarseness - Damage to heart, brain, lungs or loss of life  Patient voiced understanding.)       Anesthesia Quick Evaluation

## 2018-12-31 NOTE — Anesthesia Post-op Follow-up Note (Signed)
Anesthesia QCDR form completed.        

## 2018-12-31 NOTE — Lactation Note (Signed)
This note was copied from a baby's chart. Lactation Consultation Note  Patient Name: Denise Walker [id number] Date: 12/31/2018 Reason for consult: Initial assessment;Early term 37-38.6wks  Arrived in patient's room to introduce Lactation Consultant support. Mom verbalizes she does not wish to "stress herself out" this time around breastfeeding. She desires to attempt pumping and possibly breastfeeding until she "dries up". RN had set-up and explained pump usage to patient prior to my arrival. Asked if she would like me to go over when and how to use the pump. She denies needing assistance at this time.   Asked mom how Baby Denise Buechner is doing in SCN, and reports that she is concerned for significant tongue-tie noted by MD. States she will likely request LC support once baby has transitioned out of nursery.   Made sure mother and father aware of how to contact Lactation in case a need or question were to arise. Stated we will check-in as needed. No further questions or concerns at this time.   Maternal Data Has patient been taught Hand Expression?: Yes  Feeding    LATCH Score                   Interventions Interventions: DEBP;Breast feeding basics reviewed  Lactation Tools Discussed/Used Tools: Pump Breast pump type: Double-Electric Breast Pump   Consult Status Consult Status: Follow-up Date: 12/31/18 Follow-up type: In-patient    Lavonia Drafts 12/31/2018, 4:07 PM

## 2018-12-31 NOTE — H&P (Signed)
Date of Initial H&P: 12/28/2018  History reviewed, patient examined, no change in status, stable for surgery. 

## 2018-12-31 NOTE — Progress Notes (Signed)
Pt anxious about small red/purple splotchy area on right upper thigh. No pain. Dr. Georgianne Fick notified- to come see this evening. Pt updated.

## 2019-01-01 ENCOUNTER — Other Ambulatory Visit: Payer: BC Managed Care – PPO

## 2019-01-01 LAB — BPAM RBC
Blood Product Expiration Date: 202012102359
Blood Product Expiration Date: 202012112359
Unit Type and Rh: 9500
Unit Type and Rh: 9500

## 2019-01-01 LAB — TYPE AND SCREEN
ABO/RH(D): O NEG
Antibody Screen: POSITIVE
Unit division: 0
Unit division: 0

## 2019-01-01 LAB — CBC
HCT: 28.6 % — ABNORMAL LOW (ref 36.0–46.0)
Hemoglobin: 9 g/dL — ABNORMAL LOW (ref 12.0–15.0)
MCH: 26.7 pg (ref 26.0–34.0)
MCHC: 31.5 g/dL (ref 30.0–36.0)
MCV: 84.9 fL (ref 80.0–100.0)
Platelets: 150 10*3/uL (ref 150–400)
RBC: 3.37 MIL/uL — ABNORMAL LOW (ref 3.87–5.11)
RDW: 15.7 % — ABNORMAL HIGH (ref 11.5–15.5)
WBC: 8.6 10*3/uL (ref 4.0–10.5)
nRBC: 0 % (ref 0.0–0.2)

## 2019-01-01 LAB — GLUCOSE, CAPILLARY
Glucose-Capillary: 93 mg/dL (ref 70–99)
Glucose-Capillary: 97 mg/dL (ref 70–99)
Glucose-Capillary: 120 mg/dL — ABNORMAL HIGH (ref 70–99)
Glucose-Capillary: 116 mg/dL — ABNORMAL HIGH (ref 70–99)

## 2019-01-01 LAB — FETAL SCREEN: Fetal Screen: NEGATIVE

## 2019-01-01 MED ORDER — RHO D IMMUNE GLOBULIN 1500 UNIT/2ML IJ SOSY
300.0000 ug | PREFILLED_SYRINGE | Freq: Once | INTRAMUSCULAR | Status: AC
  Administered 2019-01-01: 300 ug via INTRAVENOUS
  Filled 2019-01-01: qty 2

## 2019-01-01 NOTE — Anesthesia Postprocedure Evaluation (Signed)
Anesthesia Post Note  Patient: Denise Walker  Procedure(s) Performed: CESAREAN SECTION (N/A )  Patient location during evaluation: Mother Baby Anesthesia Type: Spinal Level of consciousness: awake and alert Pain management: pain level controlled Vital Signs Assessment: post-procedure vital signs reviewed and stable Respiratory status: spontaneous breathing, nonlabored ventilation and respiratory function stable Cardiovascular status: stable Postop Assessment: no headache, no backache and epidural receding Anesthetic complications: no     Last Vitals:  Vitals:   01/01/19 0751 01/01/19 1126  BP: 116/75 102/70  Pulse: 99 96  Resp: 18 20  Temp: 36.9 C 36.9 C  SpO2: 100%     Last Pain:  Vitals:   01/01/19 1126  TempSrc: Oral  PainSc:                  Alison Stalling

## 2019-01-01 NOTE — Lactation Note (Signed)
This note was copied from a baby's chart. Lactation Consultation Note  Patient Name: Girl Khalia Gong Today's Date: 01/01/2019  Spoke with baby's mom in room, mom states that she is going to just formula feed for now and not pump breasts as baby having issues with spitting and questionable milk allergy.  She has only been able to breastfeed her babies before for short periods because of low milk supply related to PCOS, so right now she desires to just formula feed.  I encouraged her to call [facility] if any questions or concerns and [facility] phone # and name written on white board.   Maternal Data    Feeding Feeding Type: Bottle Fed - Formula Nipple Type: Slow - flow  LATCH Score                   Interventions    Lactation Tools Discussed/Used     Consult Status      Ferol Luz 01/01/2019, 1:27 PM

## 2019-01-01 NOTE — Progress Notes (Signed)
POD#1 rLTCS Subjective:  Doing well, some pain but this is adequately controlled. Ambulating in the fall with no problems. Was up to shower and noticed bruising under On-Q site. Tolerating a regular diet. Voiding without difficulty.  Objective:  Blood pressure 102/70, pulse 96, temperature 98.4 F (36.9 C), temperature source Oral, resp. rate 20, height 4\' 11"  (1.499 m), weight 88 kg, last menstrual period 04/03/2018, SpO2 100 %, unknown if currently breastfeeding.  General: NAD Pulmonary: no increased work of breathing Abdomen: non-distended, non-tender, fundus, lochia appropriate. Area of bruising present under On-Q catheters and at Lovenox injection site Incision: Dressing is dry and intact, very small area of old drainage at left lower corner Extremities: trace edema, no erythema, no tenderness  Results for orders placed or performed during the hospital encounter of 12/31/18 (from the past 72 hour(s))  Type and screen [facility]     Status: None   Collection Time: 12/31/18  6:30 AM  Result Value Ref Range   ABO/RH(D) O NEG    Antibody Screen POS    Sample Expiration 01/03/2019,2359    Antibody Identification PASSIVELY ACQUIRED ANTI-D    Unit Number [id number]    Blood Component Type RED CELLS,LR    Unit division 00    [facility]    Transfusion Status OK TO TRANSFUSE    Crossmatch Result COMPATIBLE    Unit Number [id number]    Blood Component Type RED CELLS,LR    Unit division 00    [facility]    Transfusion Status OK TO TRANSFUSE    Crossmatch Result COMPATIBLE   CBC     Status: Abnormal   Collection Time: 12/31/18  6:30 AM  Result Value Ref Range   WBC 9.3 4.0 - 10.5 K/uL   RBC 4.25 3.87 - 5.11 MIL/uL   Hemoglobin 11.1 (L) 12.0 - 15.0 g/dL   HCT 30.834.4 (L) 65.736.0 - 84.646.0 %   MCV 80.9 80.0 - 100.0 fL   MCH 26.1 26.0 - 34.0 pg   MCHC 32.3 30.0 - 36.0 g/dL   RDW 96.215.4 95.211.5 - 84.115.5 %   Platelets 240 150 - 400  K/uL   nRBC 0.0 0.0 - 0.2 %    Comment: Performed at [facility], [address] [place], [place] [address]  Comprehensive metabolic panel     Status: Abnormal   Collection Time: 12/31/18  6:30 AM  Result Value Ref Range   Sodium 136 135 - 145 mmol/L   Potassium 3.9 3.5 - 5.1 mmol/L   Chloride 103 98 - 111 mmol/L   CO2 20 (L) 22 - 32 mmol/L   Glucose, Bld 82 70 - 99 mg/dL   BUN 11 6 - 20 mg/dL   Creatinine, Ser 1.020.63 0.44 - 1.00 mg/dL   Calcium 8.9 8.9 - 72.510.3 mg/dL   Total Protein 6.7 6.5 - 8.1 g/dL   Albumin 3.1 (L) 3.5 - 5.0 g/dL   AST 20 15 - 41 U/L   ALT 15 0 - 44 U/L   Alkaline Phosphatase 208 (H) 38 - 126 U/L   Total Bilirubin 0.4 0.3 - 1.2 mg/dL   GFR calc non Af Amer >60 >60 mL/min   GFR calc Af Amer >60 >60 mL/min   Anion gap 13 5 - 15    Comment: Performed at [facility], [address] [place], [place] [address]  Chlamydia/NGC rt PCR [facility] only)     Status: None  Collection Time: 12/31/18  6:30 AM   Specimen: Urine; GU  Result Value Ref Range   Specimen source GC/Chlam URINE, RANDOM    Chlamydia Tr NOT DETECTED NOT DETECTED   N gonorrhoeae NOT DETECTED NOT DETECTED    Comment: (NOTE) This CT/NG assay has not been evaluated in patients with a history of  hysterectomy. Performed at [facility], [address] [place], [place] [address]   Glucose, capillary     Status: Abnormal   Collection Time: 12/31/18  7:33 AM  Result Value Ref Range   Glucose-Capillary 68 (L) 70 - 99 mg/dL  Glucose, capillary     Status: Abnormal   Collection Time: 12/31/18  9:50 AM  Result Value Ref Range   Glucose-Capillary 127 (H) 70 - 99 mg/dL  Glucose, capillary     Status: Abnormal   Collection Time: 12/31/18 12:49 PM  Result Value Ref Range   Glucose-Capillary 202 (H) 70 - 99 mg/dL  Glucose, capillary     Status: Abnormal   Collection Time: 12/31/18  5:59 PM  Result Value Ref Range   Glucose-Capillary 148 (H) 70 - 99 mg/dL  Glucose, capillary      Status: Abnormal   Collection Time: 12/31/18 10:51 PM  Result Value Ref Range   Glucose-Capillary 143 (H) 70 - 99 mg/dL  Fetal screen     Status: None   Collection Time: 01/01/19  6:08 AM  Result Value Ref Range   Fetal Screen      NEG Performed at [facility], [address] [place], [place] [address]   Rhogam injection     Status: None (Preliminary result)   Collection Time: 01/01/19  6:08 AM  Result Value Ref Range   Unit Number [id number]    Blood Component Type RHIG    Unit division 00    Status of Unit ISSUED    Transfusion Status      OK TO TRANSFUSE Performed at [facility], [address] [place], [place] [address]   CBC     Status: Abnormal   Collection Time: 01/01/19  6:08 AM  Result Value Ref Range   WBC 8.6 4.0 - 10.5 K/uL   RBC 3.37 (L) 3.87 - 5.11 MIL/uL   Hemoglobin 9.0 (L) 12.0 - 15.0 g/dL   HCT 68.3 (L) 72.9 - 02.1 %   MCV 84.9 80.0 - 100.0 fL   MCH 26.7 26.0 - 34.0 pg   MCHC 31.5 30.0 - 36.0 g/dL   RDW 11.5 (H) 52.0 - 80.2 %   Platelets 150 150 - 400 K/uL   nRBC 0.0 0.0 - 0.2 %    Comment: Performed at [facility], [address] [place], [place] [address]  Glucose, capillary     Status: None   Collection Time: 01/01/19  8:13 AM  Result Value Ref Range   Glucose-Capillary 93 70 - 99 mg/dL  Glucose, capillary     Status: None   Collection Time: 01/01/19  1:37 PM  Result Value Ref Range   Glucose-Capillary 97 70 - 99 mg/dL     Assessment:  28 y.o. [id number] postoperative day #1 recovering well  Plan:  1) Acute blood loss anemia - hemodynamically stable and asymptomatic - Continue prenatal vitamins with iron  2) Blood Type --/--/O NEG (11/19 0630) / Rubella 14.90 (05/04 0854) / Varicella Immune Information for the patient's newborn:  Haddie, Bruhl [[id number]]  O POS  - Has received RHIG  3) TDAP status:received antepartum  on 11/19/2018  4) Formula feeding/Contraception: plans IUD  5)  Disposition: continue postpartum care. MD aware of bruising at On-Q site and confirmed that patient is to continue on Lovenox for prophylaxis as she meets criteria for this therapy.  Avel Sensor, CNM 01/01/2019  3:09 PM

## 2019-01-02 LAB — CBC
HCT: 26.9 % — ABNORMAL LOW (ref 36.0–46.0)
Hemoglobin: 8.6 g/dL — ABNORMAL LOW (ref 12.0–15.0)
MCH: 26.6 pg (ref 26.0–34.0)
MCHC: 32 g/dL (ref 30.0–36.0)
MCV: 83.3 fL (ref 80.0–100.0)
Platelets: 171 10*3/uL (ref 150–400)
RBC: 3.23 MIL/uL — ABNORMAL LOW (ref 3.87–5.11)
RDW: 15.8 % — ABNORMAL HIGH (ref 11.5–15.5)
WBC: 12.5 10*3/uL — ABNORMAL HIGH (ref 4.0–10.5)
nRBC: 0 % (ref 0.0–0.2)

## 2019-01-02 LAB — RHOGAM INJECTION: Unit division: 0

## 2019-01-02 LAB — GLUCOSE, CAPILLARY: Glucose-Capillary: 99 mg/dL (ref 70–99)

## 2019-01-02 NOTE — Progress Notes (Signed)
Subjective:  Comfotable, pain well controlled. No nausea or emesis, tolerating po.  Did develop significant bruising around On-Q.  Lochia moderate.  No fevers, no chills  Objective:  Vital signs in last 24 hours: Temp:  [98.2 F (36.8 C)-98.4 F (36.9 C)] 98.2 F (36.8 C) (11/21 0738) Pulse Rate:  [86-96] 93 (11/21 0738) Resp:  [18-20] 20 (11/21 0738) BP: (98-112)/(61-74) 112/74 (11/21 0738) SpO2:  [99 %-100 %] 99 % (11/21 0738)    Intake/Output      11/20 0701 - 11/21 0700 11/21 0701 - 11/22 0700   P.O. 240    I.V. (mL/kg)     Total Intake(mL/kg) 240 (2.7)    Urine (mL/kg/hr)     Blood     Total Output     Net +240           General: NAD Pulmonary: no increased work of breathing Abdomen: non-distended, non-tender, fundus firm at level of umbilicus Incision: D/C/I, some sanguinous discharge around ON-Q site and bruising with borders marked, also bruising around the LLQ Lovenox injection site. Extremities: no edema, no erythema, no tenderness  Results for orders placed or performed during the hospital encounter of 12/31/18 (from the past 72 hour(s))  Type and screen [place]     Status: None   Collection Time: 12/31/18  6:30 AM  Result Value Ref Range   ABO/RH(D) O NEG    Antibody Screen POS    Sample Expiration 01/03/2019,2359    Antibody Identification PASSIVELY ACQUIRED ANTI-D    Unit Number [id number]    Blood Component Type RED CELLS,LR    Unit division 00    Status of Unit REL FROM [facility]    Transfusion Status OK TO TRANSFUSE    Crossmatch Result COMPATIBLE    Unit Number [id number]    Blood Component Type RED CELLS,LR    Unit division 00    Status of Unit REL FROM [facility]    Transfusion Status OK TO TRANSFUSE    Crossmatch Result COMPATIBLE   CBC     Status: Abnormal   Collection Time: 12/31/18  6:30 AM  Result Value Ref Range   WBC 9.3 4.0 - 10.5 K/uL   RBC 4.25 3.87 - 5.11 MIL/uL   Hemoglobin 11.1 (L) 12.0 - 15.0 g/dL   HCT 34.4 (L) 36.0 - 46.0 %   MCV 80.9 80.0 - 100.0 fL   MCH 26.1 26.0 - 34.0 pg   MCHC 32.3 30.0 - 36.0 g/dL   RDW 15.4 11.5 - 15.5 %   Platelets 240 150 - 400 K/uL   nRBC 0.0 0.0 - 0.2 %    Comment: Performed at [facility], [address] [place], [place] [address]  Comprehensive metabolic panel     Status: Abnormal   Collection Time: 12/31/18  6:30 AM  Result Value Ref Range   Sodium 136 135 - 145 mmol/L   Potassium 3.9 3.5 - 5.1 mmol/L   Chloride 103 98 - 111 mmol/L   CO2 20 (L) 22 - 32 mmol/L   Glucose, Bld 82 70 - 99 mg/dL   BUN 11 6 - 20 mg/dL   Creatinine, Ser 0.63 0.44 - 1.00 mg/dL   Calcium 8.9 8.9 - 10.3 mg/dL   Total Protein 6.7 6.5 - 8.1 g/dL   Albumin 3.1 (L) 3.5 - 5.0 g/dL   AST 20 15 - 41 U/L   ALT 15 0 - 44 U/L   Alkaline Phosphatase 208 (H) 38 -  126 U/L   Total Bilirubin 0.4 0.3 - 1.2 mg/dL   GFR calc non Af Amer >60 >60 mL/min   GFR calc Af Amer >60 >60 mL/min   Anion gap 13 5 - 15    Comment: Performed at [facility], [address] [place], [place] [address]  [place] rt PCR [facility] only)     Status: None   Collection Time: 12/31/18  6:30 AM   Specimen: Urine; GU  Result Value Ref Range   Specimen source GC/Chlam URINE, RANDOM    Chlamydia Tr NOT DETECTED NOT DETECTED   N gonorrhoeae NOT DETECTED NOT DETECTED    Comment: (NOTE) This CT/NG assay has not been evaluated in patients with a history of  hysterectomy. Performed at [facility], [address] [place], [place] [address]   Glucose, capillary     Status: Abnormal   Collection Time: 12/31/18  7:33 AM  Result Value Ref Range   Glucose-Capillary 68 (L) 70 - 99 mg/dL  Glucose, capillary     Status: Abnormal   Collection Time: 12/31/18  9:50 AM  Result Value Ref Range   Glucose-Capillary 127 (H) 70 - 99 mg/dL  Glucose, capillary     Status: Abnormal   Collection Time: 12/31/18 12:49 PM  Result Value Ref Range   Glucose-Capillary 202 (H) 70 - 99 mg/dL   Glucose, capillary     Status: Abnormal   Collection Time: 12/31/18  5:59 PM  Result Value Ref Range   Glucose-Capillary 148 (H) 70 - 99 mg/dL  Glucose, capillary     Status: Abnormal   Collection Time: 12/31/18 10:51 PM  Result Value Ref Range   Glucose-Capillary 143 (H) 70 - 99 mg/dL  Fetal screen     Status: None   Collection Time: 01/01/19  6:08 AM  Result Value Ref Range   Fetal Screen      NEG Performed at [facility], [address] [place], [place] [address]   Rhogam injection     Status: None   Collection Time: 01/01/19  6:08 AM  Result Value Ref Range   Unit Number [id number]    Blood Component Type RHIG    Unit division 00    Status of Unit ISSUED,FINAL    Transfusion Status      OK TO TRANSFUSE Performed at [facility], [address] [place], [place] [address]   CBC     Status: Abnormal   Collection Time: 01/01/19  6:08 AM  Result Value Ref Range   WBC 8.6 4.0 - 10.5 K/uL   RBC 3.37 (L) 3.87 - 5.11 MIL/uL   Hemoglobin 9.0 (L) 12.0 - 15.0 g/dL   HCT 28.6 (L) 36.0 - 46.0 %   MCV 84.9 80.0 - 100.0 fL   MCH 26.7 26.0 - 34.0 pg   MCHC 31.5 30.0 - 36.0 g/dL   RDW 15.7 (H) 11.5 - 15.5 %   Platelets 150 150 - 400 K/uL   nRBC 0.0 0.0 - 0.2 %    Comment: Performed at [facility], [address] [place], [place] [address]  Glucose, capillary     Status: None   Collection Time: 01/01/19  8:13 AM  Result Value Ref Range   Glucose-Capillary 93 70 - 99 mg/dL  Glucose, capillary     Status: None   Collection Time: 01/01/19  1:37 PM  Result Value Ref Range   Glucose-Capillary 97 70 - 99 mg/dL  Glucose, capillary  Status: Abnormal   Collection Time: 01/01/19  5:15 PM  Result Value Ref Range   Glucose-Capillary 120 (H) 70 - 99 mg/dL  Glucose, capillary     Status: Abnormal   Collection Time: 01/01/19  9:06 PM  Result Value Ref Range   Glucose-Capillary 116 (H) 70 - 99 mg/dL  Glucose, capillary     Status: None   Collection  Time: 01/02/19  8:46 AM  Result Value Ref Range   Glucose-Capillary 99 70 - 99 mg/dL  CBC     Status: Abnormal   Collection Time: 01/02/19  9:51 AM  Result Value Ref Range   WBC 12.5 (H) 4.0 - 10.5 K/uL   RBC 3.23 (L) 3.87 - 5.11 MIL/uL   Hemoglobin 8.6 (L) 12.0 - 15.0 g/dL   HCT 26.9 (L) 36.0 - 46.0 %   MCV 83.3 80.0 - 100.0 fL   MCH 26.6 26.0 - 34.0 pg   MCHC 32.0 30.0 - 36.0 g/dL   RDW 15.8 (H) 11.5 - 15.5 %   Platelets 171 150 - 400 K/uL   nRBC 0.0 0.0 - 0.2 %    Comment: Performed at [facility], [address], [place], [place] [address]    Immunization History  Administered Date(s) Administered  . Influenza,inj,Quad PF,6+ Mos 10/23/2017  . MMR 02/02/2018, 02/12/2018  . Tdap 11/19/2018    Assessment:   28 y.o. [id number] postoperativeday # 2 RLTCS   Plan:  ) Acute blood loss anemia - hemodynamically stable and asymptomatic - po ferrous sulfate  2) Blood Type --/--/O NEG (11/19 0630) / Rubella 14.90 (05/04 0854) / Varicella Immune  3) TDAP status up to date  4) Feeding plan bottle  5)  Education given regarding options for contraception, as well as compatibility with breast feeding if applicable.   6) Bruising around ON-Q site - discontinue Lovenox for DVT ppx, also bruising around Lovenox injection site.  Lochia moderate.  Will check repeat CBC to rule out hematoma and to check platelets to verify no evidence of HIT  7) Disposition anticipate discharge POD3-4  [name], MD, [place] 01/02/2019, 10:09 AM

## 2019-01-03 MED ORDER — OXYCODONE-ACETAMINOPHEN 5-325 MG PO TABS: 1.0000 | ORAL_TABLET | ORAL | 0 refills | Status: DC | PRN

## 2019-01-03 MED ORDER — PROMETHAZINE HCL 12.5 MG PO TABS: 12.5000 mg | ORAL_TABLET | Freq: Four times a day (QID) | ORAL | 0 refills | Status: DC | PRN

## 2019-01-03 MED ORDER — FERROUS SULFATE 325 (65 FE) MG PO TABS: 325.0000 mg | ORAL_TABLET | Freq: Every day | ORAL | 3 refills | Status: DC

## 2019-01-03 MED ORDER — SENNA 8.6 MG PO TABS
1.0000 | ORAL_TABLET | Freq: Once | ORAL | Status: DC
  Filled 2019-01-03: qty 1

## 2019-01-03 MED ORDER — IBUPROFEN 800 MG PO TABS: 800.0000 mg | ORAL_TABLET | Freq: Three times a day (TID) | ORAL | 0 refills | Status: DC | PRN

## 2019-01-03 MED ORDER — PROMETHAZINE HCL 25 MG PO TABS
25.0000 mg | ORAL_TABLET | Freq: Four times a day (QID) | ORAL | Status: DC | PRN
  Administered 2019-01-03: 25 mg via ORAL
  Filled 2019-01-03 (×2): qty 1

## 2019-01-03 NOTE — Discharge Summary (Signed)
Obstetric Discharge Summary Reason for Admission: cesarean section Prenatal Procedures: none Intrapartum Procedures: cesarean: low cervical, transverse Postpartum Procedures: none Complications-Operative and Postpartum: none Hemoglobin  Date Value Ref Range Status  01/02/2019 8.6 (L) 12.0 - 15.0 g/dL Final  33/29/5188 41.6 11.1 - 15.9 g/dL Final   HCT  Date Value Ref Range Status  01/02/2019 26.9 (L) 36.0 - 46.0 % Final   Hematocrit  Date Value Ref Range Status  11/02/2018 35.2 34.0 - 46.6 % Final    Physical Exam:  General: alert, cooperative, appears stated age and no distress Lochia: appropriate Uterine Fundus: firm Incision: healing well DVT Evaluation: No evidence of DVT seen on physical exam.  Discharge Diagnoses: Term Pregnancy-delivered  Discharge Information: Date: 01/03/2019 Activity: pelvic rest, no lifting, no driving Diet: routine Allergies as of 01/03/2019      Reactions   Fish Allergy Shortness Of Breath   Shellfish Allergy Shortness Of Breath      Medication List    STOP taking these medications   Accu-Chek FastClix Lancets Misc   Accu-Chek Guide test strip Generic drug: glucose blood   Basaglar KwikPen 100 UNIT/ML Sopn   butalbital-acetaminophen-caffeine 50-325-40 MG tablet Commonly known as: FIORICET   insulin aspart 100 UNIT/ML FlexPen Commonly known as: NOVOLOG   NovoFine 32G X 6 MM Misc Generic drug: Insulin Pen Needle   prochlorperazine 10 MG tablet Commonly known as: COMPAZINE     TAKE these medications   escitalopram 20 MG tablet Commonly known as: LEXAPRO Take 1 tablet (20 mg total) by mouth daily.   ferrous sulfate 325 (65 FE) MG tablet Take 1 tablet (325 mg total) by mouth daily.   hydrOXYzine 25 MG tablet Commonly known as: ATARAX/VISTARIL Take 1 tablet (25 mg total) by mouth every 6 (six) hours as needed for anxiety.   ibuprofen 800 MG tablet Commonly known as: ADVIL Take 1 tablet (800 mg total) by mouth every 8  (eight) hours as needed for moderate pain or cramping.   omeprazole 20 MG capsule Commonly known as: PRILOSEC Take 1 capsule (20 mg total) by mouth daily.   oxyCODONE-acetaminophen 5-325 MG tablet Commonly known as: PERCOCET/ROXICET Take 1 tablet by mouth every 4 (four) hours as needed for moderate pain or severe pain.   prenatal multivitamin Tabs tablet Take 1 tablet by mouth daily.   promethazine 12.5 MG tablet Commonly known as: PHENERGAN Take 1 tablet (12.5 mg total) by mouth every 6 (six) hours as needed for nausea or vomiting.            Discharge Care Instructions  (From admission, onward)         Start     Ordered   01/03/19 0000  Discharge wound care:    Comments: You may apply a light dressing for minor discharge from the incision or to keep waistbands of clothing from rubbing.  You may also have been discharge with a clear dressing in which case this will be removed at your postoperative clinic visit.  You may shower, use soap on your incision.  Avoid baths or soaking the incision in the first 6 weeks following your surgery..   01/03/19 1001          Condition: stable Discharge to: home Follow-up Information    Vena Austria, MD In 1 week.   Specialty: Obstetrics and Gynecology Why: For wound re-check Contact information: [address] [place] [address] [phone]           Newborn Data: Live born female  Birth Weight: 6 lb 8.8 oz (2970 g) APGAR: 8, 9  Newborn Delivery   Birth date/time: 12/31/2020 08:40:00 Delivery type: C-Section, Vacuum Assisted Trial of labor: No C-section categorization: Repeat      Home with mother.  [name] 01/03/2019, 10:03 AM

## 2019-01-03 NOTE — Progress Notes (Addendum)
Pt. Request Provider be called and asked to order Laxative. Dr. Georgianne Fick was called and Senokot was given as per order. When I entered room, Pt. Was squatting in front of recliner her husband was sitting in and Pt. Was cring stating, "these cramps are so bad and I feel like I am leaking when I push>' Assisted Pt. To bed. Fundus is firm at U/-1 and scant amount of Lochia noted on Peri Pad. Encouraged Pt. To take the ordered Senokot and another Percocet as per PRN order. Cool cloth provided for head since Pt c/o feeling,"hot" with cramping. Pt. Refuses her Lexapro.

## 2019-01-03 NOTE — Progress Notes (Signed)
Obstetric and Gynecology  Subjective  Saw patient, concern for continued abdominal pain. Patient states feels better when she places pressure on abdomen.  Bleeding remains minimal.  Worried about taking more pain medication because her stomach is sensitive.  Patient stooled yesterday, continues to pass flatus  Objective  Vital signs in last 24 hours: Temp:  [97.9 F (36.6 C)-98.2 F (36.8 C)] 98.1 F (36.7 C) (11/21 2337) Pulse Rate:  [91-94] 94 (11/21 2337) Resp:  [18-20] 18 (11/21 2337) BP: (109-117)/(65-77) 117/65 (11/21 2337) SpO2:  [99 %-100 %] 100 % (11/21 2337)    No intake or output data in the 24 hours ending 01/03/19 0051  General: NAD Abdomen: soft, non-tender, non-distended, incision D/C/I, bruising stable Extremities: no edema, erythema  Labs: Results for orders placed or performed during the hospital encounter of 12/31/18 (from the past 24 hour(s))  Glucose, capillary     Status: None   Collection Time: 01/02/19  8:46 AM  Result Value Ref Range   Glucose-Capillary 99 70 - 99 mg/dL  CBC     Status: Abnormal   Collection Time: 01/02/19  9:51 AM  Result Value Ref Range   WBC 12.5 (H) 4.0 - 10.5 K/uL   RBC 3.23 (L) 3.87 - 5.11 MIL/uL   Hemoglobin 8.6 (L) 12.0 - 15.0 g/dL   HCT 26.9 (L) 36.0 - 46.0 %   MCV 83.3 80.0 - 100.0 fL   MCH 26.6 26.0 - 34.0 pg   MCHC 32.0 30.0 - 36.0 g/dL   RDW 15.8 (H) 11.5 - 15.5 %   Platelets 171 150 - 400 K/uL   nRBC 0.0 0.0 - 0.2 %    Cultures: Results for orders placed or performed during the hospital encounter of 12/31/18  [place] rt PCR ([place] only)     Status: None   Collection Time: 12/31/18  6:30 AM   Specimen: Urine; GU  Result Value Ref Range Status   Specimen source GC/Chlam URINE, RANDOM  Final   Chlamydia Tr NOT DETECTED NOT DETECTED Final   N gonorrhoeae NOT DETECTED NOT DETECTED Final    Comment: (NOTE) This CT/NG assay has not been evaluated in patients with a history of  hysterectomy. Performed at  [facility], [address], [place], [place] [address]     Imaging:  Assessment   28 y.o. L3J0300 POD3 RLTC  Plan   - Abdominal binder placed with patient reporting good improvement in symptoms - Rx phenergan for nausea

## 2019-01-03 NOTE — Progress Notes (Signed)
DC inst reviewed with pt.  Verb u/o of home care meds and how to care for self.

## 2019-01-03 NOTE — Discharge Instructions (Signed)
Keep wound clean dry til office visit with Dr. Derinda Sis On Q pump 4-5 days after delivery

## 2019-01-03 NOTE — Progress Notes (Signed)
Dr. Georgianne Fick in to see Pt. Abdominal Binder placed and Pt states the Binder has ," helped so much." Dr. Georgianne Fick has ordered Phenergan for Pt PRN and Pt.; will administer as soon as verified by Pharmacy.

## 2019-01-03 NOTE — Progress Notes (Signed)
Reviewed with mom how to remove On Q pump on Tuesday.  Verb u/o

## 2019-01-03 NOTE — Progress Notes (Signed)
Pt. States "Cramping" Pain in abdomen is a "6" on pain scale. Scheduled Motrin and PRN Percocet 1 tab. Given. Fundus is firm at U/-1 with sl amount of Lochia.

## 2019-01-03 NOTE — Progress Notes (Addendum)
Dr. Georgianne Fick notified of Pt. Cont. C/O cramping despite medication and that Pt refuses Lexapro. Dr. Star Age is coming to see Pt.

## 2019-01-03 NOTE — Progress Notes (Signed)
DC to home.  To car via WC 

## 2019-01-14 ENCOUNTER — Other Ambulatory Visit: Payer: Self-pay

## 2019-01-14 ENCOUNTER — Ambulatory Visit: Payer: BC Managed Care – PPO | Admitting: Obstetrics and Gynecology

## 2019-01-15 ENCOUNTER — Ambulatory Visit (INDEPENDENT_AMBULATORY_CARE_PROVIDER_SITE_OTHER): Payer: BC Managed Care – PPO | Admitting: Obstetrics and Gynecology

## 2019-01-15 ENCOUNTER — Encounter: Payer: Self-pay | Admitting: Obstetrics and Gynecology

## 2019-01-15 VITALS — BP 130/90 | HR 82 | Wt 182.0 lb

## 2019-01-15 DIAGNOSIS — Z4889 Encounter for other specified surgical aftercare: Secondary | ICD-10-CM

## 2019-01-15 MED ORDER — BUSPIRONE HCL 7.5 MG PO TABS: 7.5000 mg | ORAL_TABLET | Freq: Two times a day (BID) | ORAL | 2 refills | Status: DC

## 2019-01-15 NOTE — Progress Notes (Signed)
Postoperative Follow-up Patient presents post op from RLTCS 1weeks ago for elective repeat.  Subjective: Patient reports some improvement in her preop symptoms. Eating a regular diet without difficulty. Pain is controlled without any medications.  Activity: normal activities of daily living.  Some increased anxiety noted, particularly regarding infant reflux as well as need to correct tongue tie.  Objective: Blood pressure 130/90, pulse 82, weight 182 lb (82.6 kg), last menstrual period 04/03/2018, unknown if currently breastfeeding.  General: NAD Pulmonary: no increased work of breathing Abdomen: soft, non-tender, non-distended, incision D/C/I Extremities: no edema Neurologic: normal gait    Admission on 12/31/2018, Discharged on 01/03/2019  Component Date Value Ref Range Status  . ABO/RH(D) 12/31/2018 O NEG   Final  . Antibody Screen 12/31/2018 POS   Final  . Sample Expiration 12/31/2018 01/03/2019,2359   Final  . Antibody Identification 12/31/2018 PASSIVELY ACQUIRED ANTI-D   Final  . Unit Number 12/31/2018 [id number]   Final  . Blood Component Type 12/31/2018 RED CELLS,LR   Final  . Unit division 12/31/2018 00   Final  . [facility]   Final  . Transfusion Status 12/31/2018 OK TO TRANSFUSE   Final  . Crossmatch Result 12/31/2018 COMPATIBLE   Final  . Unit Number 12/31/2018 [id number]   Final  . Blood Component Type 12/31/2018 RED CELLS,LR   Final  . Unit division 12/31/2018 00   Final  . Status of Unit 12/31/2018 [facility]   Final  . Transfusion Status 12/31/2018 OK TO TRANSFUSE   Final  . Crossmatch Result 12/31/2018 COMPATIBLE   Final  . WBC 12/31/2018 9.3  4.0 - 10.5 K/uL Final  . RBC 12/31/2018 4.25  3.87 - 5.11 MIL/uL Final  . Hemoglobin 12/31/2018 11.1* 12.0 - 15.0 g/dL Final  . HCT 95/28/413211/19/2020 34.4* 36.0 - 46.0 % Final  . MCV 12/31/2018 80.9  80.0 - 100.0 fL Final  . MCH 12/31/2018 26.1  26.0 - 34.0 pg Final  . MCHC  12/31/2018 32.3  30.0 - 36.0 g/dL Final  . RDW 44/01/027211/19/2020 15.4  11.5 - 15.5 % Final  . Platelets 12/31/2018 240  150 - 400 K/uL Final  . nRBC 12/31/2018 0.0  0.0 - 0.2 % Final   Performed at [facility], [address] [place], [place] [address]  . Sodium 12/31/2018 136  135 - 145 mmol/L Final  . Potassium 12/31/2018 3.9  3.5 - 5.1 mmol/L Final  . Chloride 12/31/2018 103  98 - 111 mmol/L Final  . CO2 12/31/2018 20* 22 - 32 mmol/L Final  . Glucose, Bld 12/31/2018 82  70 - 99 mg/dL Final  . BUN 40/34/742511/19/2020 11  6 - 20 mg/dL Final  . Creatinine, Ser 12/31/2018 0.63  0.44 - 1.00 mg/dL Final  . Calcium 95/63/875611/19/2020 8.9  8.9 - 10.3 mg/dL Final  . Total Protein 12/31/2018 6.7  6.5 - 8.1 g/dL Final  . Albumin 43/32/951811/19/2020 3.1* 3.5 - 5.0 g/dL Final  . AST 84/16/606311/19/2020 20  15 - 41 U/L Final  . ALT 12/31/2018 15  0 - 44 U/L Final  . Alkaline Phosphatase 12/31/2018 208* 38 - 126 U/L Final  . Total Bilirubin 12/31/2018 0.4  0.3 - 1.2 mg/dL Final  . GFR calc non Af Amer 12/31/2018 >60  >60 mL/min Final  . GFR calc Af Amer 12/31/2018 >60  >60 mL/min Final  . Anion gap 12/31/2018 13  5 - 15 Final   Performed at [facility], [address] [place], [place] [address]  . Specimen source GC/Chlam 12/31/2018 URINE, RANDOM   Final  . Chlamydia Tr 12/31/2018 NOT DETECTED  NOT DETECTED Final  . N gonorrhoeae 12/31/2018 NOT DETECTED  NOT DETECTED Final   Comment: (NOTE) This CT/NG assay has not been evaluated in patients with a history of  hysterectomy. Performed at [facility], [address] [place] [address], [place], [place] [address]   . Varicella 06/15/2018 Immune   Final  . Blood Product Unit Number 12/31/2018 [id number]   Final  . PRODUCT CODE 12/31/2018 [id number]   Final  . Unit Type and Rh 12/31/2018 9500   Final  . Blood Product Expiration Date 12/31/2018 259563875643   Final  . Blood Product Unit Number 12/31/2018 [id number]   Final  . PRODUCT CODE 12/31/2018 [id number]   Final   . Unit Type and Rh 12/31/2018 9500   Final  . Blood Product Expiration Date 12/31/2018 [id number]   Final  . Glucose-Capillary 12/31/2018 68* 70 - 99 mg/dL Final  . Glucose-Capillary 12/31/2018 127* 70 - 99 mg/dL Final  . Fetal Screen 42/70/6237    Final                   Value:NEG Performed at [facility], [address] [place], [place] [address]   . Unit Number 01/01/2019 [id number]   Final  . Blood Component Type 01/01/2019 RHIG   Final  . Unit division 01/01/2019 00   Final  . Status of Unit 01/01/2019 ISSUED,FINAL   Final  . Transfusion Status 01/01/2019    Final                   Value:OK TO TRANSFUSE Performed at [facility], [address] [place], [place] [address]   . Glucose-Capillary 12/31/2018 202* 70 - 99 mg/dL Final  . WBC 85/46/2703 8.6  4.0 - 10.5 K/uL Final  . RBC 01/01/2019 3.37* 3.87 - 5.11 MIL/uL Final  . Hemoglobin 01/01/2019 9.0* 12.0 - 15.0 g/dL Final  . HCT 50/10/3816 28.6* 36.0 - 46.0 % Final  . MCV 01/01/2019 84.9  80.0 - 100.0 fL Final  . MCH 01/01/2019 26.7  26.0 - 34.0 pg Final  . MCHC 01/01/2019 31.5  30.0 - 36.0 g/dL Final  . RDW 29/93/7169 15.7* 11.5 - 15.5 % Final  . Platelets 01/01/2019 150  150 - 400 K/uL Final  . nRBC 01/01/2019 0.0  0.0 - 0.2 % Final   Performed at [facility], [address], [place], [place] [address]  . Glucose-Capillary 12/31/2018 148* 70 - 99 mg/dL Final  . Glucose-Capillary 12/31/2018 143* 70 - 99 mg/dL Final  . Glucose-Capillary 01/01/2019 93  70 - 99 mg/dL Final  . Glucose-Capillary 01/01/2019 97  70 - 99 mg/dL Final  . Glucose-Capillary 01/01/2019 120* 70 - 99 mg/dL Final  . Glucose-Capillary 01/01/2019 116* 70 - 99 mg/dL Final  . Glucose-Capillary 01/02/2019 99  70 - 99 mg/dL Final  . WBC 81/02/7508 12.5* 4.0 - 10.5 K/uL Final  . RBC 01/02/2019 3.23* 3.87 - 5.11 MIL/uL Final  . Hemoglobin 01/02/2019 8.6* 12.0 - 15.0 g/dL Final  . HCT 25/85/2778 26.9* 36.0 - 46.0 % Final  .  MCV 01/02/2019 83.3  80.0 - 100.0 fL Final  . MCH 01/02/2019 26.6  26.0 - 34.0 pg Final  . MCHC 01/02/2019 32.0  30.0 - 36.0 g/dL Final  . RDW 24/23/5361 15.8* 11.5 - 15.5 % Final  . Platelets 01/02/2019  171  150 - 400 K/uL Final  . nRBC 01/02/2019 0.0  0.0 - 0.2 % Final   Performed at [facility], [address] [place], [place] [address]    Assessment: 28 y.o. s/p RLTCS stable  Plan: Patient has done well after surgery with no apparent complications.  I have discussed the post-operative course to date, and the expected progress moving forward.  The patient understands what complications to be concerned about.  I will see the patient in routine follow up, or sooner if needed.    Activity plan: No heavy lifting.  Anxiety - add buspar to lexapro.  Currently bottle feeding   [name], MD, [facility], [facility] 01/15/2019, 3:11 PM

## 2019-01-20 ENCOUNTER — Ambulatory Visit (INDEPENDENT_AMBULATORY_CARE_PROVIDER_SITE_OTHER): Payer: BC Managed Care – PPO | Admitting: Obstetrics and Gynecology

## 2019-01-20 ENCOUNTER — Encounter: Payer: Self-pay | Admitting: Obstetrics and Gynecology

## 2019-01-20 ENCOUNTER — Other Ambulatory Visit: Payer: Self-pay

## 2019-01-20 DIAGNOSIS — F329 Major depressive disorder, single episode, unspecified: Secondary | ICD-10-CM | POA: Diagnosis not present

## 2019-01-20 DIAGNOSIS — F419 Anxiety disorder, unspecified: Secondary | ICD-10-CM

## 2019-01-20 DIAGNOSIS — F32A Depression, unspecified: Secondary | ICD-10-CM

## 2019-01-20 MED ORDER — HYDROCODONE-ACETAMINOPHEN 5-325 MG PO TABS: 1.0000 | ORAL_TABLET | Freq: Four times a day (QID) | ORAL | 0 refills | Status: DC | PRN

## 2019-01-20 MED ORDER — FLUOXETINE HCL 20 MG PO CAPS: 20.0000 mg | ORAL_CAPSULE | Freq: Every day | ORAL | 3 refills | Status: DC

## 2019-01-20 NOTE — Progress Notes (Signed)
Obstetrics & Gynecology Office Visit   Chief Complaint:  Chief Complaint  Patient presents with  . Follow-up    PP depression    History of Present Illness: The patient is a 28 y.o. female presenting follow up for symptoms of anxiety and depression.  The patient is currently taking lexapro 2m for the management of her symptoms.  She has not had any recent situational stressors.  She reports symptoms of day time somnolence, insomnia, social anxiety and agorophobia.  She denies risk taking behavior, irritability, feelings of guilt, feelings of worthlessness, suicidal ideation, homicidal ideation, auditory hallucinations and visual hallucinations. Symptoms have worsened since last visit., particularly with her husband having gone back to work.      The patient does have a pre-existing history of depression and anxiety.  She  does not a prior history of suicide attempts.    Review of Systems: Review of Systems  Constitutional: Negative.   Neurological: Negative for headaches.  Psychiatric/Behavioral: Positive for depression. Negative for hallucinations, memory loss, substance abuse and suicidal ideas. The patient is nervous/anxious and has insomnia.      Past Medical History:  Past Medical History:  Diagnosis Date  . Anxiety   . BRCA negative 04/2018   MyRisk neg; IBIS=8.9%/riskscore=6.7%  . Eclampsia   . Family history of ovarian cancer   . Gestational diabetes   . Migraine headache   . PCOS (polycystic ovarian syndrome)     Past Surgical History:  Past Surgical History:  Procedure Laterality Date  . CESAREAN SECTION N/A 07/15/2014   Procedure: CESAREAN SECTION;  Surgeon: RGae Dry MD;  Location: [facility] ORS;  Service: Obstetrics;  Laterality: N/A;  . CESAREAN SECTION N/A 02/10/2018   Procedure: CESAREAN SECTION;  Surgeon: JWill Bonnet MD;  Location: [facility] ORS;  Service: Obstetrics;  Laterality: N/A;  . CESAREAN SECTION N/A 12/31/2018   Procedure: CESAREAN  SECTION;  Surgeon: SMalachy Mood MD;  Location: [facility] ORS;  Service: Obstetrics;  Laterality: N/A;  . HERNIA REPAIR  10/2013  . WISDOM TOOTH EXTRACTION      Gynecologic History: No LMP recorded.  Obstetric History: [id number] Family History:  Family History  Problem Relation Age of Onset  . Ovarian cancer Mother   . Arthritis Mother   . Diabetes Paternal Grandfather   . Diabetes Paternal Aunt   . Ovarian cancer Maternal Aunt   . Breast cancer Maternal Grandmother   . Uterine cancer Cousin     Social History:  Social History   Socioeconomic History  . Marital status: Married    Spouse name: CEinar Pheasant . Number of children: Not on file  . Years of education: Not on file  . Highest education level: Not on file  Occupational History  . Not on file  Tobacco Use  . Smoking status: Never Smoker  . Smokeless tobacco: Never Used  Substance and Sexual Activity  . Alcohol use: No  . Drug use: Never  . Sexual activity: Yes    Birth control/protection: Other-see comments    Comment: will discuss with MD  Other Topics Concern  . Not on file  Social History Narrative  . Not on file   Social Determinants of Health   Financial Resource Strain:   . Difficulty of Paying Living Expenses: Not on file  Food Insecurity:   . Worried About RCharity fundraiserin the Last Year: Not on file  . Ran Out of Food in the Last Year: Not on file  Transportation Needs:   . Film/video editor (Medical): Not on file  . Lack of Transportation (Non-Medical): Not on file  Physical Activity:   . Days of Exercise per Week: Not on file  . Minutes of Exercise per Session: Not on file  Stress:   . Feeling of Stress : Not on file  Social Connections:   . Frequency of Communication with Friends and Family: Not on file  . Frequency of Social Gatherings with Friends and Family: Not on file  . Attends Religious Services: Not on file  . Active Member of Clubs or Organizations: Not on file  . Attends  Archivist Meetings: Not on file  . Marital Status: Not on file  Intimate Partner Violence:   . Fear of Current or Ex-Partner: Not on file  . Emotionally Abused: Not on file  . Physically Abused: Not on file  . Sexually Abused: Not on file    Allergies:  Allergies  Allergen Reactions  . Fish Allergy Shortness Of Breath  . Shellfish Allergy Shortness Of Breath    Medications: Prior to Admission medications   Medication Sig Start Date End Date Taking? Authorizing Provider  busPIRone (BUSPAR) 7.5 MG tablet Take 1 tablet (7.5 mg total) by mouth 2 (two) times daily. 01/15/19  Yes [name], MD  escitalopram (LEXAPRO) 20 MG tablet Take 1 tablet (20 mg total) by mouth daily. 04/15/18  Yes [name], MD  ferrous sulfate 325 (65 FE) MG tablet Take 1 tablet (325 mg total) by mouth daily. 01/03/19 01/03/20 Yes [name], MD  hydrOXYzine (ATARAX/VISTARIL) 25 MG tablet Take 1 tablet (25 mg total) by mouth every 6 (six) hours as needed for anxiety. 04/15/18  Yes [name], MD  ibuprofen (ADVIL) 800 MG tablet Take 1 tablet (800 mg total) by mouth every 8 (eight) hours as needed for moderate pain or cramping. 01/03/19  Yes [name], MD  omeprazole (PRILOSEC) 20 MG capsule Take 1 capsule (20 mg total) by mouth daily. 11/02/18  Yes [name], MD  oxyCODONE-acetaminophen (PERCOCET/ROXICET) 5-325 MG tablet Take 1 tablet by mouth every 4 (four) hours as needed for moderate pain or severe pain. 01/03/19  Yes [name], MD  Prenatal Vit-Fe Fumarate-FA (PRENATAL MULTIVITAMIN) TABS tablet Take 1 tablet by mouth daily.    Yes [provider]  promethazine (PHENERGAN) 12.5 MG tablet Take 1 tablet (12.5 mg total) by mouth every 6 (six) hours as needed for nausea or vomiting. 01/03/19  Yes [name], MD    Physical Exam Vitals: There were no vitals filed for this visit. No LMP recorded.  General: NAD HEENT: normocephalic, anicteric  Pulmonary: No increased work of breathing Neurologic: Grossly intact Psychiatric: mood appropriate, affect full  Edinburgh Postnatal Depression Scale - 01/20/19 1513      Edinburgh Postnatal Depression Scale:  In the Past 7 Days   I have been able to laugh and see the funny side of things.  1    I have looked forward with enjoyment to things.  1    I have blamed myself unnecessarily when things went wrong.  2    I have been anxious or worried for no good reason.  3   This week only   I have felt scared or panicky for no good reason.  2    Things have been getting on top of me.  2    I have been so unhappy that I have had difficulty sleeping.  2   mainly  because of newborn   I have felt sad or miserable.  2    I have been so unhappy that I have been crying.  1    The thought of harming myself has occurred to me.  0    Edinburgh Postnatal Depression Scale Total  16      Assessment: 28 y.o. [id number] follow up anxiety and depression  Plan: Problem List Items Addressed This Visit    None    Visit Diagnoses    Anxiety and depression    -  Primary      1) Anxieyt/Depression - will switch from lexapro to prozac 46m  2) Thyroid and B12 screen has not been obtained previously  3) Telephone time Time 11:415m   4)  Return in about 1 week (around 01/27/2019) for medication follow up.    AnMalachy MoodMD, FAGreenfieldB/GYN, [facility] 01/26/2019, 7:56 PM

## 2019-01-26 ENCOUNTER — Ambulatory Visit (INDEPENDENT_AMBULATORY_CARE_PROVIDER_SITE_OTHER): Payer: BC Managed Care – PPO | Admitting: Obstetrics and Gynecology

## 2019-01-26 ENCOUNTER — Encounter: Payer: Self-pay | Admitting: Obstetrics and Gynecology

## 2019-01-26 ENCOUNTER — Other Ambulatory Visit: Payer: Self-pay

## 2019-01-26 ENCOUNTER — Other Ambulatory Visit: Payer: Self-pay | Admitting: Obstetrics and Gynecology

## 2019-01-26 VITALS — BP 124/82 | HR 80 | Wt 187.0 lb

## 2019-01-26 DIAGNOSIS — F32A Depression, unspecified: Secondary | ICD-10-CM

## 2019-01-26 DIAGNOSIS — F329 Major depressive disorder, single episode, unspecified: Secondary | ICD-10-CM

## 2019-01-26 DIAGNOSIS — F419 Anxiety disorder, unspecified: Secondary | ICD-10-CM

## 2019-01-26 MED ORDER — FLUOXETINE HCL 40 MG PO CAPS: 40.0000 mg | ORAL_CAPSULE | Freq: Every day | ORAL | 2 refills | Status: DC

## 2019-01-26 NOTE — Patient Instructions (Addendum)
Denise [name] 

## 2019-01-26 NOTE — Progress Notes (Signed)
Obstetrics & Gynecology Office Visit   Chief Complaint:  Chief Complaint  Patient presents with  . Postpartum Care    PP depression    History of Present Illness: The patient is a 28 y.o. female presenting follow up for symptoms of anxiety and depression.  The patient is currently taking prozac 67m daily and buspar 7.527mbid for the management of her symptoms, was switched secondary to non-response to lexapro 2048m She has had any recent situational stressors (child birth, CodEinar Pheasantr husband being back at work).  She reports symptoms of anhedonia, day time somnolence, insomnia, irritability, social anxiety, feelings of guilt and feelings of worthlessness.  She denies risk taking behavior, suicidal ideation, homicidal ideation, auditory hallucinations and visual hallucinations. Symptoms have remained unchanged since last visit.   She does feel like she has been significantly more irritable in the past week and finds her self lashing out more about little things.  The patient does have a pre-existing history of depression and anxiety.  She  does not a prior history of suicide attempts.    Review of Systems: Review of Systems  Constitutional: Negative.   Gastrointestinal: Negative for nausea.  Neurological: Negative for headaches.  Psychiatric/Behavioral: Positive for depression. Negative for hallucinations, memory loss, substance abuse and suicidal ideas. The patient is nervous/anxious and has insomnia.      Past Medical History:  Past Medical History:  Diagnosis Date  . Anxiety   . BRCA negative 04/2018   MyRisk neg; IBIS=8.9%/riskscore=6.7%  . Eclampsia   . Family history of ovarian cancer   . Gestational diabetes   . Migraine headache   . PCOS (polycystic ovarian syndrome)     Past Surgical History:  Past Surgical History:  Procedure Laterality Date  . CESAREAN SECTION N/A 07/15/2014   Procedure: CESAREAN SECTION;  Surgeon: RobGae DryD;  Location: [facility];   Service: Obstetrics;  Laterality: N/A;  . CESAREAN SECTION N/A 02/10/2018   Procedure: CESAREAN SECTION;  Surgeon: JacWill BonnetD;  Location: [facility] OR[facility];  Service: Obstetrics;  Laterality: N/A;  . CESAREAN SECTION N/A 12/31/2018   Procedure: CESAREAN SECTION;  Surgeon: StaMalachy MoodD;  Location: [facility] ORS;  Service: Obstetrics;  Laterality: N/A;  . HERNIA REPAIR  10/2013  . WISDOM TOOTH EXTRACTION      Gynecologic History: No LMP recorded.  Obstetric History: [id number] History:  Family History  Problem Relation Age of Onset  . Ovarian cancer Mother   . Arthritis Mother   . Diabetes Paternal Grandfather   . Diabetes Paternal Aunt   . Ovarian cancer Maternal Aunt   . Breast cancer Maternal Grandmother   . Uterine cancer Cousin     Social History:  Social History   Socioeconomic History  . Marital status: Married    Spouse name: CodEinar Pheasant Number of children: Not on file  . Years of education: Not on file  . Highest education level: Not on file  Occupational History  . Not on file  Tobacco Use  . Smoking status: Never Smoker  . Smokeless tobacco: Never Used  Substance and Sexual Activity  . Alcohol use: No  . Drug use: Never  . Sexual activity: Yes    Birth control/protection: Other-see comments    Comment: will discuss with MD  Other Topics Concern  . Not on file  Social History Narrative  . Not on file   Social Determinants of Health   Financial Resource Strain:   .  Difficulty of Paying Living Expenses: Not on file  Food Insecurity:   . Worried About Running Out of Food in the Last Year: Not on file  . Ran Out of Food in the Last Year: Not on file  Transportation Needs:   . Lack of Transportation (Medical): Not on file  . Lack of Transportation (Non-Medical): Not on file  Physical Activity:   . Days of Exercise per Week: Not on file  . Minutes of Exercise per Session: Not on file  Stress:   . Feeling of Stress : Not on file  Social  Connections:   . Frequency of Communication with Friends and Family: Not on file  . Frequency of Social Gatherings with Friends and Family: Not on file  . Attends Religious Services: Not on file  . Active Member of Clubs or Organizations: Not on file  . Attends Club or Organization Meetings: Not on file  . Marital Status: Not on file  Intimate Partner Violence:   . Fear of Current or Ex-Partner: Not on file  . Emotionally Abused: Not on file  . Physically Abused: Not on file  . Sexually Abused: Not on file    Allergies:  Allergies  Allergen Reactions  . Fish Allergy Shortness Of Breath  . Shellfish Allergy Shortness Of Breath    Medications: Prior to Admission medications   Medication Sig Start Date End Date Taking? Authorizing Provider  busPIRone (BUSPAR) 7.5 MG tablet Take 1 tablet (7.5 mg total) by mouth 2 (two) times daily. 01/15/19   , , MD  ferrous sulfate 325 (65 FE) MG tablet Take 1 tablet (325 mg total) by mouth daily. 01/03/19 01/03/20  , , MD  FLUoxetine (PROZAC) 40 MG capsule Take 1 capsule (40 mg total) by mouth daily. 01/26/19   , , MD  HYDROcodone-acetaminophen (NORCO/VICODIN) 5-325 MG tablet Take 1 tablet by mouth every 6 (six) hours as needed. 01/20/19   , , MD  hydrOXYzine (ATARAX/VISTARIL) 25 MG tablet Take 1 tablet (25 mg total) by mouth every 6 (six) hours as needed for anxiety. 04/15/18   , , MD  ibuprofen (ADVIL) 800 MG tablet Take 1 tablet (800 mg total) by mouth every 8 (eight) hours as needed for moderate pain or cramping. 01/03/19   , , MD  omeprazole (PRILOSEC) 20 MG capsule Take 1 capsule (20 mg total) by mouth daily. 11/02/18   , , MD  Prenatal Vit-Fe Fumarate-FA (PRENATAL MULTIVITAMIN) TABS tablet Take 1 tablet by mouth daily.     [provider]  promethazine (PHENERGAN) 12.5 MG tablet Take 1 tablet (12.5 mg total) by mouth every 6 (six) hours as  needed for nausea or vomiting. 01/03/19   , , MD    Physical Exam Vitals:  Vitals:   01/26/19 1051  BP: 124/82  Pulse: 80   No LMP recorded.  General: NAD HEENT: normocephalic, anicteric Pulmonary: No increased work of breathing Neurologic: Grossly intact Psychiatric: mood appropriate, affect full  Edinburgh Postnatal Depression Scale - 01/26/19 1054      Edinburgh Postnatal Depression Scale:  In the Past 7 Days   I have been able to laugh and see the funny side of things.  2    I have looked forward with enjoyment to things.  2    I have blamed myself unnecessarily when things went wrong.  2    I have been anxious or worried for no good reason.  2    I have felt scared or panicky for   no good reason.  2    Things have been getting on top of me.  2    I have been so unhappy that I have had difficulty sleeping.  2    I have felt sad or miserable.  2    I have been so unhappy that I have been crying.  1    The thought of harming myself has occurred to me.  0    Edinburgh Postnatal Depression Scale Total  17       GAD 7 : Generalized Anxiety Score 04/15/2018 03/12/2018  Nervous, Anxious, on Edge 1 2  Control/stop worrying 0 2  Worry too much - different things 1 1  Trouble relaxing 1 1  Restless 0 0  Easily annoyed or irritable 1 1  Afraid - awful might happen 0 1  Total GAD 7 Score 4 8    Depression screen [facility] 2/9 08/13/2018 07/16/2018 04/15/2018  Decreased Interest 0 0 0  Down, Depressed, Hopeless 0 0 0  PHQ - 2 Score 0 0 0  Altered sleeping - - 0  Tired, decreased energy - - 1  Change in appetite - - 0  Feeling bad or failure about yourself  - - 0  Trouble concentrating - - 1  Moving slowly or fidgety/restless - - 0  Suicidal thoughts - - 0  PHQ-9 Score - - 2    Depression screen [facility] 2/9 08/13/2018 07/16/2018 04/15/2018 03/12/2018 09/12/2017  Decreased Interest 0 0 0 0 0  Down, Depressed, Hopeless 0 0 0 1 0  PHQ - 2 Score 0 0 0 1 0  Altered sleeping - - 0 2  -  Tired, decreased energy - - 1 2 -  Change in appetite - - 0 0 -  Feeling bad or failure about yourself  - - 0 0 -  Trouble concentrating - - 1 0 -  Moving slowly or fidgety/restless - - 0 0 -  Suicidal thoughts - - 0 0 -  PHQ-9 Score - - 2 5 -     Assessment: 28 y.o. [id number] follow up anxiety and depression  Plan: Problem List Items Addressed This Visit    None    Visit Diagnoses    Anxiety and depression    -  Primary      1) INcrease prozac to 73m daily.  If worsening in irritability would change from prozac to another agent.  Have not tried SNRI, so may change classes  2) Thyroid and B12 screen has been obtained previously  3) Return in about 2 weeks (around 02/09/2019) for medication follow up.   AMalachy Mood MD, FLoura PardonOB/GYN, CNapa

## 2019-02-06 ENCOUNTER — Other Ambulatory Visit: Payer: Self-pay | Admitting: Obstetrics and Gynecology

## 2019-02-10 ENCOUNTER — Ambulatory Visit (INDEPENDENT_AMBULATORY_CARE_PROVIDER_SITE_OTHER): Payer: BC Managed Care – PPO | Admitting: Obstetrics and Gynecology

## 2019-02-10 ENCOUNTER — Encounter: Payer: Self-pay | Admitting: Obstetrics and Gynecology

## 2019-02-10 ENCOUNTER — Other Ambulatory Visit: Payer: Self-pay

## 2019-02-10 VITALS — BP 120/74 | Wt 188.0 lb

## 2019-02-10 DIAGNOSIS — O99345 Other mental disorders complicating the puerperium: Secondary | ICD-10-CM

## 2019-02-10 DIAGNOSIS — F53 Postpartum depression: Secondary | ICD-10-CM

## 2019-02-10 MED ORDER — FLUOXETINE HCL 20 MG PO CAPS: 60.0000 mg | ORAL_CAPSULE | Freq: Every day | ORAL | 3 refills | Status: DC

## 2019-02-11 NOTE — Progress Notes (Signed)
Obstetrics & Gynecology Office Visit   Chief Complaint:  Chief Complaint  Patient presents with  . Follow-up    PP depression    History of Present Illness: The patient is a 28 y.o. female presenting follow up for symptoms of postpartum depression.  The patient is currently taking prozac 64m po daily for the management of her symptoms.  She has had recent situational stressors, infant underwent procedure to release tongue tie which has to be repeated, also being monitored for weight gain.  She reports symptoms of anhedonia, insomnia, irritability and social anxiety.  She denies risk taking behavior, feelings of guilt, feelings of worthlessness, suicidal ideation, homicidal ideation, auditory hallucinations and visual hallucinations. Symptoms have improved since last visit.     The patient does have a pre-existing history of depression and anxiety.  She  does not a prior history of suicide attempts.    Review of Systems: Review of Systems  Constitutional: Negative.   Gastrointestinal: Positive for nausea.  Neurological: Negative for headaches.  Psychiatric/Behavioral: Positive for depression. Negative for hallucinations, memory loss, substance abuse and suicidal ideas. The patient is nervous/anxious and has insomnia.      Past Medical History:  Past Medical History:  Diagnosis Date  . Anxiety   . BRCA negative 04/2018   MyRisk neg; IBIS=8.9%/riskscore=6.7%  . Eclampsia   . Family history of ovarian cancer   . Gestational diabetes   . Migraine headache   . PCOS (polycystic ovarian syndrome)     Past Surgical History:  Past Surgical History:  Procedure Laterality Date  . CESAREAN SECTION N/A 07/15/2014   Procedure: CESAREAN SECTION;  Surgeon: RGae Dry MD;  Location: [facility] ORS;  Service: Obstetrics;  Laterality: N/A;  . CESAREAN SECTION N/A 02/10/2018   Procedure: CESAREAN SECTION;  Surgeon: JWill Bonnet MD;  Location: [facility] ORS;  Service: Obstetrics;   Laterality: N/A;  . CESAREAN SECTION N/A 12/31/2018   Procedure: CESAREAN SECTION;  Surgeon: SMalachy Mood MD;  Location: [facility] ORS;  Service: Obstetrics;  Laterality: N/A;  . HERNIA REPAIR  10/2013  . WISDOM TOOTH EXTRACTION      Gynecologic History: No LMP recorded.  Obstetric History: [id number] Family History:  Family History  Problem Relation Age of Onset  . Ovarian cancer Mother   . Arthritis Mother   . Diabetes Paternal Grandfather   . Diabetes Paternal Aunt   . Ovarian cancer Maternal Aunt   . Breast cancer Maternal Grandmother   . Uterine cancer Cousin     Social History:  Social History   Socioeconomic History  . Marital status: Married    Spouse name: CEinar Pheasant . Number of children: Not on file  . Years of education: Not on file  . Highest education level: Not on file  Occupational History  . Not on file  Tobacco Use  . Smoking status: Never Smoker  . Smokeless tobacco: Never Used  Substance and Sexual Activity  . Alcohol use: No  . Drug use: Never  . Sexual activity: Yes    Birth control/protection: Other-see comments    Comment: will discuss with MD  Other Topics Concern  . Not on file  Social History Narrative  . Not on file   Social Determinants of Health   Financial Resource Strain:   . Difficulty of Paying Living Expenses: Not on file  Food Insecurity:   . Worried About RCharity fundraiserin the Last Year: Not on file  . Ran Out  of Food in the Last Year: Not on file  Transportation Needs:   . Lack of Transportation (Medical): Not on file  . Lack of Transportation (Non-Medical): Not on file  Physical Activity:   . Days of Exercise per Week: Not on file  . Minutes of Exercise per Session: Not on file  Stress:   . Feeling of Stress : Not on file  Social Connections:   . Frequency of Communication with Friends and Family: Not on file  . Frequency of Social Gatherings with Friends and Family: Not on file  . Attends Religious Services: Not  on file  . Active Member of Clubs or Organizations: Not on file  . Attends Archivist Meetings: Not on file  . Marital Status: Not on file  Intimate Partner Violence:   . Fear of Current or Ex-Partner: Not on file  . Emotionally Abused: Not on file  . Physically Abused: Not on file  . Sexually Abused: Not on file    Allergies:  Allergies  Allergen Reactions  . Fish Allergy Shortness Of Breath  . Shellfish Allergy Shortness Of Breath    Medications: Prior to Admission medications   Medication Sig Start Date End Date Taking? Authorizing Provider  busPIRone (BUSPAR) 7.5 MG tablet TAKE 1 TABLET (7.5 MG TOTAL) BY MOUTH 2 (TWO) TIMES DAILY. 02/08/19   [name], MD  ferrous sulfate 325 (65 FE) MG tablet Take 1 tablet (325 mg total) by mouth daily. 01/03/19 01/03/20  [name], MD  FLUoxetine (PROZAC) 20 MG capsule Take 3 capsules (60 mg total) by mouth daily. 02/10/19   [name], MD  HYDROcodone-acetaminophen (NORCO/VICODIN) 5-325 MG tablet Take 1 tablet by mouth every 6 (six) hours as needed. 01/20/19   [name], MD  hydrOXYzine (ATARAX/VISTARIL) 25 MG tablet Take 1 tablet (25 mg total) by mouth every 6 (six) hours as needed for anxiety. 04/15/18   [name], MD  ibuprofen (ADVIL) 800 MG tablet Take 1 tablet (800 mg total) by mouth every 8 (eight) hours as needed for moderate pain or cramping. 01/03/19   [name], MD  omeprazole (PRILOSEC) 20 MG capsule Take 1 capsule (20 mg total) by mouth daily. 11/02/18   [name], MD  Prenatal Vit-Fe Fumarate-FA (PRENATAL MULTIVITAMIN) TABS tablet Take 1 tablet by mouth daily.     [provider]  promethazine (PHENERGAN) 12.5 MG tablet Take 1 tablet (12.5 mg total) by mouth every 6 (six) hours as needed for nausea or vomiting. 01/03/19   [name], MD    Physical Exam Vitals:  Vitals:   02/10/19 1135  BP: 120/74   No LMP recorded.  General: NAD HEENT:  normocephalic, anicteric Pulmonary: No increased work of breathing Neurologic: Grossly intact Psychiatric: mood appropriate, affect full    GAD 7 : Generalized Anxiety Score 02/10/2019 04/15/2018 03/12/2018  Nervous, Anxious, on Edge _0 Control/stop worrying 2 0 2  Worry too much - different things _1 Trouble relaxing _2 Restless 1 0 0  Easily annoyed or irritable _3 Afraid - awful might happen 1 0 1  Total GAD 7 Score _4 Anxiety Difficulty Somewhat difficult - -    Depression screen [facility] 2/9 02/10/2019 08/13/2018 07/16/2018  Decreased Interest 2 0 0  Down, Depressed, Hopeless 1 0 0  PHQ - 2 Score 3 0 0  Altered sleeping 2 - -  Tired, decreased energy 3 - -  Change in  appetite 3 - -  Feeling bad or failure about yourself  1 - -  Trouble concentrating 2 - -  Moving slowly or fidgety/restless 1 - -  Suicidal thoughts 0 - -  PHQ-9 Score 15 - -  Difficult doing work/chores Somewhat difficult - -    Depression screen [facility] 2/9 02/10/2019 08/13/2018 07/16/2018 04/15/2018 03/12/2018  Decreased Interest 2 0 0 0 0  Down, Depressed, Hopeless 1 0 0 0 1  PHQ - 2 Score 3 0 0 0 1  Altered sleeping 2 - - 0 2  Tired, decreased energy 3 - - 1 2  Change in appetite 3 - - 0 0  Feeling bad or failure about yourself  1 - - 0 0  Trouble concentrating 2 - - 1 0  Moving slowly or fidgety/restless 1 - - 0 0  Suicidal thoughts 0 - - 0 0  PHQ-9 Score 15 - - 2 5  Difficult doing work/chores Somewhat difficult - - - -     Assessment: 28 y.o. [id number] follow up postpartum depression  Plan: Problem List Items Addressed This Visit    None      1) Postpartum depression - increase Prozac to 108m po daily, large situational component but also history of significant postpartum depression after G1  2) Thyroid and B12 screen has not been obtained previously  3) Return in about 1 week (around 02/17/2019) for IUD insertion.    AMalachy Mood MD, FLoura PardonOB/GYN, [facility] 02/11/2019, 9:23 PM

## 2019-02-24 ENCOUNTER — Ambulatory Visit: Payer: BC Managed Care – PPO | Admitting: Obstetrics and Gynecology

## 2019-02-26 ENCOUNTER — Telehealth: Payer: Self-pay | Admitting: Obstetrics and Gynecology

## 2019-02-26 NOTE — Telephone Encounter (Signed)
Patient is schedule for mirena placement on 03/10/19 with AMS in [place]

## 2019-03-04 ENCOUNTER — Other Ambulatory Visit: Payer: Self-pay | Admitting: Obstetrics and Gynecology

## 2019-03-04 NOTE — Telephone Encounter (Signed)
Please advise 

## 2019-03-08 NOTE — Telephone Encounter (Signed)
Patient is reschedule to 03/18/19 at the [facility] location with AMS

## 2019-03-08 NOTE — Telephone Encounter (Signed)
schedule change/ lvm to call back to r/s

## 2019-03-10 ENCOUNTER — Ambulatory Visit: Payer: BC Managed Care – PPO | Admitting: Obstetrics and Gynecology

## 2019-03-18 ENCOUNTER — Other Ambulatory Visit: Payer: Self-pay

## 2019-03-18 ENCOUNTER — Ambulatory Visit (INDEPENDENT_AMBULATORY_CARE_PROVIDER_SITE_OTHER): Payer: BC Managed Care – PPO | Admitting: Obstetrics and Gynecology

## 2019-03-18 DIAGNOSIS — Z30430 Encounter for insertion of intrauterine contraceptive device: Secondary | ICD-10-CM

## 2019-03-18 NOTE — Progress Notes (Signed)
   GYNECOLOGY OFFICE PROCEDURE NOTE  Denise Walker is a 29 y.o. [id number] here for a Mirena IUD insertion. No GYN concerns.  Last pap smear was on 07/22/2017 and was normal.  The patient is currently using breast feeding for contraception and her LMP is No LMP recorded..  The indication for her IUD is contraception/cycle control.  IUD Insertion Procedure Note Patient identified, informed consent performed, consent signed.   Discussed risks of irregular bleeding, cramping, infection, malpositioning, expulsion or uterine perforation of the IUD (1:1000 placements)  which may require further procedure such as laparoscopy.  IUD while effective at preventing pregnancy do not prevent transmission of sexually transmitted diseases and use of barrier methods for this purpose was discussed. Time out was performed.  Urine pregnancy test negative.  Speculum placed in the vagina.  Cervix visualized.  Cleaned with Betadine x 2.  Grasped anteriorly with a single tooth tenaculum.  Uterus sounded to 7 cm. IUD placed per manufacturer's recommendations.  Strings trimmed to 3 cm. Tenaculum was removed, good hemostasis noted.  Patient tolerated procedure well.   Patient was given post-procedure instructions.  She was advised to have backup contraception for one week.  Patient was also asked to check IUD strings periodically and follow up in 6 weeks for IUD check.  Vena Austria, MD, [facility], [facility]

## 2019-03-23 ENCOUNTER — Other Ambulatory Visit: Payer: Self-pay | Admitting: Obstetrics and Gynecology

## 2019-03-23 MED ORDER — HYDROXYZINE HCL 25 MG PO TABS: 25.0000 mg | ORAL_TABLET | Freq: Four times a day (QID) | ORAL | 0 refills | Status: DC | PRN

## 2019-04-06 NOTE — Telephone Encounter (Signed)
Mirena rcvd/charged 03/18/2019

## 2019-04-09 NOTE — Progress Notes (Signed)
cancel

## 2019-04-28 ENCOUNTER — Other Ambulatory Visit: Payer: Self-pay

## 2019-04-28 ENCOUNTER — Encounter: Payer: Self-pay | Admitting: Obstetrics and Gynecology

## 2019-04-28 ENCOUNTER — Ambulatory Visit (INDEPENDENT_AMBULATORY_CARE_PROVIDER_SITE_OTHER): Payer: BC Managed Care – PPO | Admitting: Obstetrics and Gynecology

## 2019-04-28 VITALS — BP 134/69 | Wt 202.0 lb

## 2019-04-28 DIAGNOSIS — F419 Anxiety disorder, unspecified: Secondary | ICD-10-CM

## 2019-04-28 DIAGNOSIS — Z30431 Encounter for routine checking of intrauterine contraceptive device: Secondary | ICD-10-CM

## 2019-04-28 DIAGNOSIS — F339 Major depressive disorder, recurrent, unspecified: Secondary | ICD-10-CM | POA: Diagnosis not present

## 2019-04-28 NOTE — Progress Notes (Signed)
Obstetrics & Gynecology Office Visit   Chief Complaint:  Chief Complaint  Patient presents with  . Follow-up    IUD string check    History of Present Illness: 29 y.o. patient presenting for follow up of Mirena IUD placement 6+ weeks ago.  The indication for her IUD was contraception.  She denies any complications since her IUD placement.  Still having some occasional spotting.  is able to feel strings.     Has had significant stressor regarding apneic episodes in her youngest daughter.  Work up at [facility] appears to show inflammation around the epiglottis and this seems to be exacerbated by reflux.  Review of Systems: Review of Systems  Constitutional: Negative.   Gastrointestinal: Negative.   Genitourinary: Negative.     Past Medical History:  Past Medical History:  Diagnosis Date  . Anxiety   . BRCA negative 04/2018   MyRisk neg; IBIS=8.9%/riskscore=6.7%  . Eclampsia   . Family history of ovarian cancer   . Gestational diabetes   . Migraine headache   . PCOS (polycystic ovarian syndrome)     Past Surgical History:  Past Surgical History:  Procedure Laterality Date  . CESAREAN SECTION N/A 07/15/2014   Procedure: CESAREAN SECTION;  Surgeon: Gae Dry, MD;  Location: [facility] ORS;  Service: Obstetrics;  Laterality: N/A;  . CESAREAN SECTION N/A 02/10/2018   Procedure: CESAREAN SECTION;  Surgeon: Will Bonnet, MD;  Location: [facility] ORS;  Service: Obstetrics;  Laterality: N/A;  . CESAREAN SECTION N/A 12/31/2018   Procedure: CESAREAN SECTION;  Surgeon: [name], MD;  Location: [facility] ORS;  Service: Obstetrics;  Laterality: N/A;  . HERNIA REPAIR  10/2013  . WISDOM TOOTH EXTRACTION      Gynecologic History: No LMP recorded. (Menstrual status: IUD).  Obstetric History: [id number]  Family History:  Family History  Problem Relation Age of Onset  . Ovarian cancer Mother   . Arthritis Mother   . Diabetes Paternal Grandfather   . Diabetes Paternal Aunt   .  Ovarian cancer Maternal Aunt   . Breast cancer Maternal Grandmother   . Uterine cancer Cousin     Social History:  Social History   Socioeconomic History  . Marital status: Married    Spouse name: Einar Pheasant  . Number of children: Not on file  . Years of education: Not on file  . Highest education level: Not on file  Occupational History  . Not on file  Tobacco Use  . Smoking status: Never Smoker  . Smokeless tobacco: Never Used  Substance and Sexual Activity  . Alcohol use: No  . Drug use: Never  . Sexual activity: Yes    Birth control/protection: Other-see comments    Comment: will discuss with MD  Other Topics Concern  . Not on file  Social History Narrative  . Not on file   Social Determinants of Health   Financial Resource Strain:   . Difficulty of Paying Living Expenses:   Food Insecurity:   . Worried About Charity fundraiser in the Last Year:   . Arboriculturist in the Last Year:   Transportation Needs:   . Film/video editor (Medical):   Marland Kitchen Lack of Transportation (Non-Medical):   Physical Activity:   . Days of Exercise per Week:   . Minutes of Exercise per Session:   Stress:   . Feeling of Stress :   Social Connections:   . Frequency of Communication with Friends and Family:   .  Frequency of Social Gatherings with Friends and Family:   . Attends Religious Services:   . Active Member of Clubs or Organizations:   . Attends Archivist Meetings:   Marland Kitchen Marital Status:   Intimate Partner Violence:   . Fear of Current or Ex-Partner:   . Emotionally Abused:   Marland Kitchen Physically Abused:   . Sexually Abused:     Allergies:  Allergies  Allergen Reactions  . Fish Allergy Shortness Of Breath  . Shellfish Allergy Shortness Of Breath    Medications: Prior to Admission medications   Medication Sig Start Date End Date Taking? Authorizing Provider  busPIRone (BUSPAR) 7.5 MG tablet TAKE 1 TABLET (7.5 MG TOTAL) BY MOUTH 2 (TWO) TIMES DAILY. 02/08/19  Yes  [name], MD  FLUoxetine (PROZAC) 20 MG capsule TAKE 3 CAPSULES BY MOUTH EVERY DAY 03/05/19  Yes [name], MD  hydrOXYzine (ATARAX/VISTARIL) 25 MG tablet Take 1 tablet (25 mg total) by mouth every 6 (six) hours as needed for anxiety. 03/23/19  Yes [name], MD  ibuprofen (ADVIL) 800 MG tablet Take 1 tablet (800 mg total) by mouth every 8 (eight) hours as needed for moderate pain or cramping. 01/03/19  Yes [name], MD  Prenatal Vit-Fe Fumarate-FA (PRENATAL MULTIVITAMIN) TABS tablet Take 1 tablet by mouth daily.    Yes [provider]    Physical Exam Blood pressure 134/69, weight 202 lb (91.6 kg), not currently breastfeeding. No LMP recorded. (Menstrual status: IUD).  General: NAD HEENT: normocephalic, anicteric Pulmonary: No increased work of breathing  Genitourinary:  External: Normal external female genitalia.  Normal urethral meatus, normal  Bartholin's and Skene's glands.    Vagina: Normal vaginal mucosa, no evidence of prolapse.    Cervix: Grossly normal in appearance, no bleeding, IUD strings visualized 2cm  Uterus: Non-enlarged, mobile, normal contour.  No CMT  Adnexa: ovaries non-enlarged, no adnexal masses  Rectal: deferred  Lymphatic: no evidence of inguinal lymphadenopathy Extremities: no edema, erythema, or tenderness Neurologic: Grossly intact Psychiatric: mood appropriate, affect full  Female chaperone present for pelvic and breast  portions of the physical exam  GAD 7 : Generalized Anxiety Score 04/28/2019 02/10/2019 04/15/2018 03/12/2018  Nervous, Anxious, on Edge _0 Control/stop worrying 1 2 0 2  Worry too much - different things _1 Trouble relaxing _2 Restless 2 1 0 0  Easily annoyed or irritable _3 Afraid - awful might happen 1 1 0 1  Total GAD 7 Score _4 Anxiety Difficulty Somewhat difficult Somewhat difficult - -    Depression screen [facility] 2/9 04/28/2019 02/10/2019 08/13/2018 07/16/2018  04/15/2018  Decreased Interest 1 2 0 0 0  Down, Depressed, Hopeless 2 1 0 0 0  PHQ - 2 Score 3 3 0 0 0  Altered sleeping 2 2 - - 0  Tired, decreased energy 2 3 - - 1  Change in appetite 2 3 - - 0  Feeling bad or failure about yourself  1 1 - - 0  Trouble concentrating 2 2 - - 1  Moving slowly or fidgety/restless 0 1 - - 0  Suicidal thoughts 0 0 - - 0  PHQ-9 Score 12 15 - - 2  Difficult doing work/chores Somewhat difficult Somewhat difficult - - -     Assessment: 29 y.o. [id number] IUD string check  Plan: Problem List Items Addressed This Visit    None  Visit Diagnoses    IUD check up    -  Primary       1.  The patient was given instructions to check her IUD strings monthly and call with any problems or concerns.  She should call for fevers, chills, abnormal vaginal discharge, pelvic pain, or other complaints.  2.   IUDs while effective at preventing pregnancy do not prevent transmission of sexually transmitted diseases and use of barrier methods for this purpose was discussed.  Low overall incidence of failure with 99.7% efficacy rate in typical use.  The patient has not contraindication to IUD placement.  3.  She will return for a annual exam in 1 year.  All questions answered.  4) A total of 15 minutes were spent in face-to-face contact with the patient during this encounter with over half of that time devoted to counseling and coordination of care.  5) Anxiety/Depression - continue prozac and buspar at current dose.  Will re-evaluate once situational component has resolved  6) Return in about 3 months (around 07/29/2019) for medication follow up .   [name], MD, [facility], [facility] 04/28/2019, 12:05 PM

## 2019-05-03 ENCOUNTER — Other Ambulatory Visit: Payer: Self-pay | Admitting: Obstetrics and Gynecology

## 2019-05-29 ENCOUNTER — Other Ambulatory Visit: Payer: Self-pay | Admitting: Obstetrics and Gynecology

## 2019-05-29 MED ORDER — HYDROXYZINE HCL 25 MG PO TABS: 25.0000 mg | ORAL_TABLET | Freq: Four times a day (QID) | ORAL | 0 refills | Status: DC | PRN

## 2019-06-07 ENCOUNTER — Other Ambulatory Visit: Payer: Self-pay | Admitting: Obstetrics and Gynecology

## 2019-06-07 MED ORDER — VENLAFAXINE HCL ER 75 MG PO CP24: 75.0000 mg | ORAL_CAPSULE | Freq: Every day | ORAL | 3 refills | Status: DC

## 2019-06-07 NOTE — Progress Notes (Signed)
PHQ- of 21 9 GAD 7 of 19, discontinue prozac switch to Effexor (previously on Zoloft and lexapro)  Follow up 1 week to ascertain response

## 2019-06-07 NOTE — Telephone Encounter (Signed)
Follow up 1 week for medication change

## 2019-06-08 NOTE — Telephone Encounter (Signed)
Patient is schedule for Wednesday, 06/16/19 with AMS

## 2019-06-16 ENCOUNTER — Other Ambulatory Visit: Payer: Self-pay

## 2019-06-16 ENCOUNTER — Telehealth (INDEPENDENT_AMBULATORY_CARE_PROVIDER_SITE_OTHER): Payer: BC Managed Care – PPO | Admitting: Obstetrics and Gynecology

## 2019-06-16 ENCOUNTER — Encounter: Payer: Self-pay | Admitting: Obstetrics and Gynecology

## 2019-06-16 DIAGNOSIS — F329 Major depressive disorder, single episode, unspecified: Secondary | ICD-10-CM | POA: Diagnosis not present

## 2019-06-16 DIAGNOSIS — F419 Anxiety disorder, unspecified: Secondary | ICD-10-CM | POA: Diagnosis not present

## 2019-06-16 DIAGNOSIS — F32A Depression, unspecified: Secondary | ICD-10-CM

## 2019-06-16 MED ORDER — DULOXETINE HCL 20 MG PO CPEP: 20.0000 mg | ORAL_CAPSULE | Freq: Every day | ORAL | 3 refills | Status: DC

## 2019-06-16 NOTE — Progress Notes (Signed)
I connected with Denise Walker on 06/19/19 at  2:50 PM EDT by telephone and verified that I am speaking with the correct person using two identifiers.   I discussed the limitations, risks, security and privacy concerns of performing an evaluation and management service by telephone and the availability of in person appointments. I also discussed with the patient that there may be a patient responsible charge related to this service. The patient expressed understanding and agreed to proceed.  The patient was at home I spoke with the patient from my workstation phone The names of people involved in this encounter were: Denise Walker , and [facility] Visit   Chief Complaint:  Chief Complaint  Patient presents with  . Follow-up    History of Present Illness: The patient is a 29 y.o. female presenting follow up for symptoms of anxiety and depression.  The patient is currently taking Effexor XR 50m for the management of her symptoms and busapr 142mpo bid.  She has not had any new situational stressors.  She reports symptoms of anhedonia, insomnia, irritability, social anxiety, agorophobia, feelings of guilt and feelings of worthlessness.  She denies risk taking behavior, suicidal ideation, homicidal ideation, auditory hallucinations and visual hallucinations. Symptoms have worsened since last visit and switching off of prozac.     The patient does have a pre-existing history of depression and anxiety.  She  does not a prior history of suicide attempts.  Previous treatment tied include Prozac, Zoloft and Lexapro  Review of Systems: Review of Systems  Constitutional: Negative.   Gastrointestinal: Negative for nausea.  Neurological: Negative for headaches.  Psychiatric/Behavioral: Positive for depression. Negative for hallucinations, memory loss, substance abuse and suicidal ideas. The patient is nervous/anxious and has insomnia.     Past Medical  History:  Past Medical History:  Diagnosis Date  . Anxiety   . BRCA negative 04/2018   MyRisk neg; IBIS=8.9%/riskscore=6.7%  . Eclampsia   . Family history of ovarian cancer   . Gestational diabetes   . Migraine headache   . PCOS (polycystic ovarian syndrome)     Past Surgical History:  Past Surgical History:  Procedure Laterality Date  . CESAREAN SECTION N/A 07/15/2014   Procedure: CESAREAN SECTION;  Surgeon: RoGae DryMD;  Location: [facility] ORS;  Service: Obstetrics;  Laterality: N/A;  . CESAREAN SECTION N/A 02/10/2018   Procedure: CESAREAN SECTION;  Surgeon: JaWill BonnetMD;  Location: [facility] ORS;  Service: Obstetrics;  Laterality: N/A;  . CESAREAN SECTION N/A 12/31/2018   Procedure: CESAREAN SECTION;  Surgeon: StMalachy MoodMD;  Location: [facility] ORS;  Service: Obstetrics;  Laterality: N/A;  . HERNIA REPAIR  10/2013  . WISDOM TOOTH EXTRACTION      Gynecologic History: No LMP recorded. (Menstrual status: IUD).  Obstetric History: [id number] History:  Family History  Problem Relation Age of Onset  . Ovarian cancer Mother   . Arthritis Mother   . Diabetes Paternal Grandfather   . Diabetes Paternal Aunt   . Ovarian cancer Maternal Aunt   . Breast cancer Maternal Grandmother   . Uterine cancer Cousin     Social History:  Social History   Socioeconomic History  . Marital status: Married    Spouse name: CoEinar Pheasant. Number of children: Not on file  . Years of education: Not on file  . Highest education level: Not on file  Occupational History  . Not on file  Tobacco Use  .  Smoking status: Never Smoker  . Smokeless tobacco: Never Used  Substance and Sexual Activity  . Alcohol use: No  . Drug use: Never  . Sexual activity: Yes    Birth control/protection: Other-see comments    Comment: will discuss with MD  Other Topics Concern  . Not on file  Social History Narrative  . Not on file   Social Determinants of Health   Financial Resource Strain:     . Difficulty of Paying Living Expenses:   Food Insecurity:   . Worried About Charity fundraiser in the Last Year:   . Arboriculturist in the Last Year:   Transportation Needs:   . Film/video editor (Medical):   Marland Kitchen Lack of Transportation (Non-Medical):   Physical Activity:   . Days of Exercise per Week:   . Minutes of Exercise per Session:   Stress:   . Feeling of Stress :   Social Connections:   . Frequency of Communication with Friends and Family:   . Frequency of Social Gatherings with Friends and Family:   . Attends Religious Services:   . Active Member of Clubs or Organizations:   . Attends Archivist Meetings:   Marland Kitchen Marital Status:   Intimate Partner Violence:   . Fear of Current or Ex-Partner:   . Emotionally Abused:   Marland Kitchen Physically Abused:   . Sexually Abused:     Allergies:  Allergies  Allergen Reactions  . Fish Allergy Shortness Of Breath  . Shellfish Allergy Shortness Of Breath    Medications: Prior to Admission medications   Medication Sig Start Date End Date Taking? Authorizing Provider  busPIRone (BUSPAR) 7.5 MG tablet TAKE 1 TABLET (7.5 MG TOTAL) BY MOUTH 2 (TWO) TIMES DAILY. 02/08/19   [name], MD  ferrous sulfate 325 (65 FE) MG tablet TAKE 1 TABLET BY MOUTH EVERY DAY 05/03/19   [name], MD  hydrOXYzine (ATARAX/VISTARIL) 25 MG tablet Take 1 tablet (25 mg total) by mouth every 6 (six) hours as needed for anxiety. 05/29/19   [name], MD  ibuprofen (ADVIL) 800 MG tablet Take 1 tablet (800 mg total) by mouth every 8 (eight) hours as needed for moderate pain or cramping. 01/03/19   [name], MD  Prenatal Vit-Fe Fumarate-FA (PRENATAL MULTIVITAMIN) TABS tablet Take 1 tablet by mouth daily.     [provider]  venlafaxine XR (EFFEXOR-XR) 75 MG 24 hr capsule Take 1 capsule (75 mg total) by mouth daily. 06/07/19   [name], MD    Physical Exam Vitals: There were no vitals filed for this visit. No  LMP recorded. (Menstrual status: IUD).  No physical exam as this was a remote telephone visit to promote social distancing during the current COVID-19 Pandemic    GAD 7 : Generalized Anxiety Score 06/16/2019 04/28/2019 02/10/2019 04/15/2018  Nervous, Anxious, on Edge 3 1 2 1   Control/stop worrying 3 1 2  0  Worry too much - different things 3 1 1 1   Trouble relaxing 3 2 1 1   Restless 1 2 1  0  Easily annoyed or irritable 3 2 1 1   Afraid - awful might happen 2 1 1  0  Total GAD 7 Score 18 10 9 4   Anxiety Difficulty Extremely difficult Somewhat difficult Somewhat difficult -    Depression screen [facility] 2/9 06/16/2019 04/28/2019 02/10/2019  Decreased Interest 2 1 2   Down, Depressed, Hopeless 3 2 1   PHQ - 2 Score 5 3 3   Altered sleeping 3  2 2  Tired, decreased energy 3 2 3   Change in appetite 2 2 3   Feeling bad or failure about yourself  2 1 1   Trouble concentrating 3 2 2   Moving slowly or fidgety/restless 2 0 1  Suicidal thoughts 0 0 0  PHQ-9 Score 20 12 15   Difficult doing work/chores Extremely dIfficult Somewhat difficult Somewhat difficult    Depression screen [facility] 2/9 06/16/2019 04/28/2019 02/10/2019 08/13/2018 07/16/2018  Decreased Interest 2 1 2  0 0  Down, Depressed, Hopeless 3 2 1  0 0  PHQ - 2 Score 5 3 3  0 0  Altered sleeping 3 2 2  - -  Tired, decreased energy 3 2 3  - -  Change in appetite 2 2 3  - -  Feeling bad or failure about yourself  2 1 1  - -  Trouble concentrating 3 2 2  - -  Moving slowly or fidgety/restless 2 0 1 - -  Suicidal thoughts 0 0 0 - -  PHQ-9 Score 20 12 15  - -  Difficult doing work/chores Extremely dIfficult Somewhat difficult Somewhat difficult - -     Assessment: 29 y.o. [id number] follow up anxiety and depresion  Plan: Problem List Items Addressed This Visit    None    Visit Diagnoses    Anxiety and depression    -  Primary   Relevant Medications   DULoxetine (CYMBALTA) 20 MG capsule      1) Discontinue Effexor XR 55m as no repsonse at all subjectively  worse, start cymbalta 262mmay increase to 4062mf tolerating in 3-4 days.  Referral to psychiatry for CBT.   2) Thyroid and B12 screen has been obtained previously  3) Video visit 15 minutes  4) Return in about 1 week (around 06/23/2019) for medication follow up (phone or video).    AndMalachy MoodD, FACCowles/GYN, [facility] 06/16/2019, 3:09 PM

## 2019-06-24 ENCOUNTER — Other Ambulatory Visit: Payer: Self-pay

## 2019-06-24 ENCOUNTER — Telehealth (INDEPENDENT_AMBULATORY_CARE_PROVIDER_SITE_OTHER): Payer: BC Managed Care – PPO | Admitting: Obstetrics and Gynecology

## 2019-06-24 DIAGNOSIS — F329 Major depressive disorder, single episode, unspecified: Secondary | ICD-10-CM

## 2019-06-24 DIAGNOSIS — F32A Depression, unspecified: Secondary | ICD-10-CM

## 2019-06-24 DIAGNOSIS — F419 Anxiety disorder, unspecified: Secondary | ICD-10-CM | POA: Diagnosis not present

## 2019-06-24 MED ORDER — DULOXETINE HCL 60 MG PO CPEP: 60.0000 mg | ORAL_CAPSULE | Freq: Every day | ORAL | 2 refills | Status: DC

## 2019-06-24 NOTE — Progress Notes (Signed)
I connected with Denise Walker on 06/27/19 at  4:10 PM EDT by telephone and verified that I am speaking with the correct person using two identifiers.   I discussed the limitations, risks, security and privacy concerns of performing an evaluation and management service by telephone and the availability of in person appointments. I also discussed with the patient that there may be a patient responsible charge related to this service. The patient expressed understanding and agreed to proceed.  The patient was at home I spoke with the patient from my workstation phone The names of people involved in this encounter were: Denise Walker , and [facility] Visit   Chief Complaint: No chief complaint on file.   History of Present Illness: The patient is a 29 y.o. female presenting follow up for symptoms of anxiety and depression.  The patient is currently taking Cymbalta 38m and Buspar 7.564mbid for the management of her symptoms.  She has not had any recent situational stressors.  She reports symptoms of anhedonia, insomnia, irritability and social anxiety.  She denies agorophobia, feelings of guilt, feelings of worthlessness, suicidal ideation, homicidal ideation, auditory hallucinations and visual hallucinations. Symptoms have improved since last visit.     The patient does have a pre-existing history of depression and anxiety.  She  does not a prior history of suicide attempts.    Review of Systems: Review of Systems  Constitutional: Negative.   Gastrointestinal: Negative for nausea.  Neurological: Negative for headaches.  Psychiatric/Behavioral: Positive for depression. Negative for hallucinations, memory loss, substance abuse and suicidal ideas. The patient is nervous/anxious and has insomnia.      Past Medical History:  Past Medical History:  Diagnosis Date  . Anxiety   . BRCA negative 04/2018   MyRisk neg; IBIS=8.9%/riskscore=6.7%  . Eclampsia    . Family history of ovarian cancer   . Gestational diabetes   . Migraine headache   . PCOS (polycystic ovarian syndrome)     Past Surgical History:  Past Surgical History:  Procedure Laterality Date  . CESAREAN SECTION N/A 07/15/2014   Procedure: CESAREAN SECTION;  Surgeon: RoGae DryMD;  Location: [facility] ORS;  Service: Obstetrics;  Laterality: N/A;  . CESAREAN SECTION N/A 02/10/2018   Procedure: CESAREAN SECTION;  Surgeon: JaWill BonnetMD;  Location: [facility] ORS;  Service: Obstetrics;  Laterality: N/A;  . CESAREAN SECTION N/A 12/31/2018   Procedure: CESAREAN SECTION;  Surgeon: StMalachy MoodMD;  Location: [facility] ORS;  Service: Obstetrics;  Laterality: N/A;  . HERNIA REPAIR  10/2013  . WISDOM TOOTH EXTRACTION      Gynecologic History: No LMP recorded. (Menstrual status: IUD).  Obstetric History: [id number] History:  Family History  Problem Relation Age of Onset  . Ovarian cancer Mother   . Arthritis Mother   . Diabetes Paternal Grandfather   . Diabetes Paternal Aunt   . Ovarian cancer Maternal Aunt   . Breast cancer Maternal Grandmother   . Uterine cancer Cousin     Social History:  Social History   Socioeconomic History  . Marital status: Married    Spouse name: CoEinar Pheasant. Number of children: Not on file  . Years of education: Not on file  . Highest education level: Not on file  Occupational History  . Not on file  Tobacco Use  . Smoking status: Never Smoker  . Smokeless tobacco: Never Used  Substance and Sexual Activity  . Alcohol use: No  .  Drug use: Never  . Sexual activity: Yes    Birth control/protection: Other-see comments    Comment: will discuss with MD  Other Topics Concern  . Not on file  Social History Narrative  . Not on file   Social Determinants of Health   Financial Resource Strain:   . Difficulty of Paying Living Expenses:   Food Insecurity:   . Worried About Running Out of Food in the Last Year:   . Ran Out of Food in  the Last Year:   Transportation Needs:   . Lack of Transportation (Medical):   . Lack of Transportation (Non-Medical):   Physical Activity:   . Days of Exercise per Week:   . Minutes of Exercise per Session:   Stress:   . Feeling of Stress :   Social Connections:   . Frequency of Communication with Friends and Family:   . Frequency of Social Gatherings with Friends and Family:   . Attends Religious Services:   . Active Member of Clubs or Organizations:   . Attends Club or Organization Meetings:   . Marital Status:   Intimate Partner Violence:   . Fear of Current or Ex-Partner:   . Emotionally Abused:   . Physically Abused:   . Sexually Abused:     Allergies:  Allergies  Allergen Reactions  . Fish Allergy Shortness Of Breath  . Shellfish Allergy Shortness Of Breath    Medications: Prior to Admission medications   Medication Sig Start Date End Date Taking? Authorizing Provider  busPIRone (BUSPAR) 7.5 MG tablet TAKE 1 TABLET (7.5 MG TOTAL) BY MOUTH 2 (TWO) TIMES DAILY. 02/08/19   , , MD  DULoxetine (CYMBALTA) 60 MG capsule Take 1 capsule (60 mg total) by mouth daily. 06/24/19   , , MD  ferrous sulfate 325 (65 FE) MG tablet TAKE 1 TABLET BY MOUTH EVERY DAY 05/03/19   , , MD  hydrOXYzine (ATARAX/VISTARIL) 25 MG tablet Take 1 tablet (25 mg total) by mouth every 6 (six) hours as needed for anxiety. 05/29/19   , , MD  ibuprofen (ADVIL) 800 MG tablet Take 1 tablet (800 mg total) by mouth every 8 (eight) hours as needed for moderate pain or cramping. 01/03/19   , , MD  Prenatal Vit-Fe Fumarate-FA (PRENATAL MULTIVITAMIN) TABS tablet Take 1 tablet by mouth daily.     [provider]    Physical Exam Vitals: There were no vitals filed for this visit. No LMP recorded. (Menstrual status: IUD).    No physical exam as this was a remote telephone visit to promote social distancing during the current COVID-19  Pandemic   GAD 7 : Generalized Anxiety Score 06/16/2019 04/28/2019 02/10/2019 04/15/2018  Nervous, Anxious, on Edge 3 1 2 1  Control/stop worrying 3 1 2 0  Worry too much - different things 3 1 1 1  Trouble relaxing 3 2 1 1  Restless 1 2 1 0  Easily annoyed or irritable 3 2 1 1  Afraid - awful might happen 2 1 1 0  Total GAD 7 Score 18 10 9 4  Anxiety Difficulty Extremely difficult Somewhat difficult Somewhat difficult -    Depression screen PHQ 2/9 06/16/2019 04/28/2019 02/10/2019  Decreased Interest 2 1 2  Down, Depressed, Hopeless 3 2 1  PHQ - 2 Score 5 3 3  Altered sleeping 3 2 2  Tired, decreased energy 3 2 3  Change in appetite 2 2 3  Feeling bad or failure about yourself  2   1 1  Trouble concentrating 3 2 2  Moving slowly or fidgety/restless 2 0 1  Suicidal thoughts 0 0 0  PHQ-9 Score 20 12 15  Difficult doing work/chores Extremely dIfficult Somewhat difficult Somewhat difficult    Depression screen PHQ 2/9 06/16/2019 04/28/2019 02/10/2019 08/13/2018 07/16/2018  Decreased Interest 2 1 2 0 0  Down, Depressed, Hopeless 3 2 1 0 0  PHQ - 2 Score 5 3 3 0 0  Altered sleeping 3 2 2 - -  Tired, decreased energy 3 2 3 - -  Change in appetite 2 2 3 - -  Feeling bad or failure about yourself  2 1 1 - -  Trouble concentrating 3 2 2 - -  Moving slowly or fidgety/restless 2 0 1 - -  Suicidal thoughts 0 0 0 - -  PHQ-9 Score 20 12 15 - -  Difficult doing work/chores Extremely dIfficult Somewhat difficult Somewhat difficult - -     Assessment: 28 y.o. G6P3033 anxiety and depression  Plan: Problem List Items Addressed This Visit    None    Visit Diagnoses    Anxiety and depression    -  Primary   Relevant Medications   DULoxetine (CYMBALTA) 60 MG capsule      1) Anxiety/Depression - Noted improvement thus far on Cymbalta 40mg.  Increase to 60mg   2) Thyroid and B12 screen has been obtained previously   3) Telephone Time 19:20minutes  4) Return in about 1 week (around 07/01/2019)  for medication follow up .    , MD, FACOG [facility], [facility] 06/24/2019, 4:27 PM     

## 2019-07-02 NOTE — Telephone Encounter (Signed)
Patient is schedule 07/05/19 at 9 am with AMS for phone visit

## 2019-07-02 NOTE — Telephone Encounter (Signed)
Follow up phone, video or in person sometime next week

## 2019-07-05 ENCOUNTER — Ambulatory Visit: Payer: BC Managed Care – PPO | Admitting: Obstetrics and Gynecology

## 2019-07-05 ENCOUNTER — Other Ambulatory Visit: Payer: Self-pay

## 2019-07-05 ENCOUNTER — Encounter: Payer: Self-pay | Admitting: Obstetrics and Gynecology

## 2019-07-05 ENCOUNTER — Ambulatory Visit (INDEPENDENT_AMBULATORY_CARE_PROVIDER_SITE_OTHER): Payer: BC Managed Care – PPO | Admitting: Obstetrics and Gynecology

## 2019-07-05 DIAGNOSIS — F32A Depression, unspecified: Secondary | ICD-10-CM

## 2019-07-05 DIAGNOSIS — F419 Anxiety disorder, unspecified: Secondary | ICD-10-CM | POA: Diagnosis not present

## 2019-07-05 DIAGNOSIS — F329 Major depressive disorder, single episode, unspecified: Secondary | ICD-10-CM

## 2019-07-05 NOTE — Progress Notes (Signed)
I connected with Denise Walker  on 07/05/19 at 11:30 AM EDT by telephone and verified that I am speaking with the correct person using two identifiers.   I discussed the limitations, risks, security and privacy concerns of performing an evaluation and management service by telephone and the availability of in person appointments. I also discussed with the patient that there may be a patient responsible charge related to this service. The patient expressed understanding and agreed to proceed.  The patient was at home I spoke with the patient from my workstation phone The names of people involved in this encounter were: Denise Walker , and [facility] Visit   Chief Complaint:  Chief Complaint  Patient presents with  . Follow-up    Anxiety/depression    History of Present Illness: The patient is a 29 y.o. female presenting follow up for symptoms of anxiety and depression.  The patient is currently taking Citalopram 87m daily for the management of her symptoms.  She has recent situational stressors, currently still undergoing work up on baby Emberlyn's choking episodes.  Had another endoscopy scheduled for next week.  Also stress in regard to in-laws.  She reports symptoms of anhedonia, irritability and social anxiety.  She denies day time somnolence, agorophobia, feelings of guilt, feelings of worthlessness, suicidal ideation, homicidal ideation, auditory hallucinations and visual hallucinations. Symptoms have improved since last visit.     The patient does have a pre-existing history of depression and anxiety.  She  does not a prior history of suicide attempts.    Review of Systems: Review of Systems  Constitutional: Negative.   Gastrointestinal: Negative for nausea and vomiting.  Neurological: Negative for headaches.  Psychiatric/Behavioral: Positive for depression. Negative for hallucinations, memory loss, substance abuse and suicidal ideas. The  patient is nervous/anxious.      Past Medical History:  Past Medical History:  Diagnosis Date  . Anxiety   . BRCA negative 04/2018   MyRisk neg; IBIS=8.9%/riskscore=6.7%  . Eclampsia   . Family history of ovarian cancer   . Gestational diabetes   . Migraine headache   . PCOS (polycystic ovarian syndrome)     Past Surgical History:  Past Surgical History:  Procedure Laterality Date  . CESAREAN SECTION N/A 07/15/2014   Procedure: CESAREAN SECTION;  Surgeon: RGae Dry MD;  Location: [facility] ORS;  Service: Obstetrics;  Laterality: N/A;  . CESAREAN SECTION N/A 02/10/2018   Procedure: CESAREAN SECTION;  Surgeon: JWill Bonnet MD;  Location: [facility] ORS;  Service: Obstetrics;  Laterality: N/A;  . CESAREAN SECTION N/A 12/31/2018   Procedure: CESAREAN SECTION;  Surgeon: SMalachy Mood MD;  Location: [facility] ORS;  Service: Obstetrics;  Laterality: N/A;  . HERNIA REPAIR  10/2013  . WISDOM TOOTH EXTRACTION      Gynecologic History: No LMP recorded. (Menstrual status: IUD).  Obstetric History: [id number] Family History:  Family History  Problem Relation Age of Onset  . Ovarian cancer Mother   . Arthritis Mother   . Diabetes Paternal Grandfather   . Diabetes Paternal Aunt   . Ovarian cancer Maternal Aunt   . Breast cancer Maternal Grandmother   . Uterine cancer Cousin     Social History:  Social History   Socioeconomic History  . Marital status: Married    Spouse name: CEinar Pheasant . Number of children: Not on file  . Years of education: Not on file  . Highest education level: Not on file  Occupational History  .  Not on file  Tobacco Use  . Smoking status: Never Smoker  . Smokeless tobacco: Never Used  Substance and Sexual Activity  . Alcohol use: No  . Drug use: Never  . Sexual activity: Yes    Birth control/protection: Other-see comments    Comment: will discuss with MD  Other Topics Concern  . Not on file  Social History Narrative  . Not on file   Social  Determinants of Health   Financial Resource Strain:   . Difficulty of Paying Living Expenses:   Food Insecurity:   . Worried About Charity fundraiser in the Last Year:   . Arboriculturist in the Last Year:   Transportation Needs:   . Film/video editor (Medical):   Marland Kitchen Lack of Transportation (Non-Medical):   Physical Activity:   . Days of Exercise per Week:   . Minutes of Exercise per Session:   Stress:   . Feeling of Stress :   Social Connections:   . Frequency of Communication with Friends and Family:   . Frequency of Social Gatherings with Friends and Family:   . Attends Religious Services:   . Active Member of Clubs or Organizations:   . Attends Archivist Meetings:   Marland Kitchen Marital Status:   Intimate Partner Violence:   . Fear of Current or Ex-Partner:   . Emotionally Abused:   Marland Kitchen Physically Abused:   . Sexually Abused:     Allergies:  Allergies  Allergen Reactions  . Fish Allergy Shortness Of Breath  . Shellfish Allergy Shortness Of Breath    Medications: Prior to Admission medications   Medication Sig Start Date End Date Taking? Authorizing Provider  busPIRone (BUSPAR) 7.5 MG tablet TAKE 1 TABLET (7.5 MG TOTAL) BY MOUTH 2 (TWO) TIMES DAILY. 02/08/19  Yes [name], MD  DULoxetine (CYMBALTA) 60 MG capsule Take 1 capsule (60 mg total) by mouth daily. 06/24/19  Yes [name], MD  hydrOXYzine (ATARAX/VISTARIL) 25 MG tablet Take 1 tablet (25 mg total) by mouth every 6 (six) hours as needed for anxiety. 05/29/19  Yes [name], MD  ibuprofen (ADVIL) 800 MG tablet Take 1 tablet (800 mg total) by mouth every 8 (eight) hours as needed for moderate pain or cramping. 01/03/19  Yes [name], MD    Physical Exam Vitals: There were no vitals filed for this visit. No LMP recorded. (Menstrual status: IUD).  No physical exam as this was a remote telephone visit to promote social distancing during the current COVID-19 Pandemic   GAD 7 :  Generalized Anxiety Score 07/05/2019 06/16/2019 04/28/2019 02/10/2019  Nervous, Anxious, on Edge 2 3 1 2   Control/stop worrying 2 3 1 2   Worry too much - different things 2 3 1 1   Trouble relaxing 2 3 2 1   Restless 2 1 2 1   Easily annoyed or irritable 2 3 2 1   Afraid - awful might happen 2 2 1 1   Total GAD 7 Score 14 18 10 9   Anxiety Difficulty Very difficult Extremely difficult Somewhat difficult Somewhat difficult    Depression screen [facility] 2/9 07/05/2019 06/16/2019 04/28/2019  Decreased Interest 2 2 1   Down, Depressed, Hopeless 2 3 2   PHQ - 2 Score 4 5 3   Altered sleeping 2 3 2   Tired, decreased energy 2 3 2   Change in appetite 2 2 2   Feeling bad or failure about yourself  2 2 1   Trouble concentrating 2 3 2   Moving slowly or fidgety/restless  1 2 0  Suicidal thoughts 0 0 0  PHQ-9 Score 15 20 12   Difficult doing work/chores Very difficult Extremely dIfficult Somewhat difficult    Depression screen [facility] 2/9 07/05/2019 06/16/2019 04/28/2019 02/10/2019 08/13/2018  Decreased Interest 2 2 1 2  0  Down, Depressed, Hopeless 2 3 2 1  0  PHQ - 2 Score 4 5 3 3  0  Altered sleeping 2 3 2 2  -  Tired, decreased energy 2 3 2 3  -  Change in appetite 2 2 2 3  -  Feeling bad or failure about yourself  2 2 1 1  -  Trouble concentrating 2 3 2 2  -  Moving slowly or fidgety/restless 1 2 0 1 -  Suicidal thoughts 0 0 0 0 -  PHQ-9 Score 15 20 12 15  -  Difficult doing work/chores Very difficult Extremely dIfficult Somewhat difficult Somewhat difficult -     Assessment: 29 y.o. [id number] follow up anixety/depression  Plan: Problem List Items Addressed This Visit    None    Visit Diagnoses    Anxiety and depression    -  Primary      1) Anxiety / Depression - continue citalopram at current dose.  Large situational component.  Follow up after endoscopy procedure.  2) Thyroid and B12 screen has been obtained previously  3) Telephone Time 12:30 minutes  4) Return in about 16 days (around 07/21/2019) for  Medication follow up .   [name], MD, [facility], [facility] 07/05/2019, 2:37 PM

## 2019-07-22 ENCOUNTER — Other Ambulatory Visit: Payer: Self-pay | Admitting: Obstetrics and Gynecology

## 2019-07-22 NOTE — Telephone Encounter (Signed)
Please advise if refill is appropriate 

## 2019-07-29 ENCOUNTER — Other Ambulatory Visit: Payer: Self-pay | Admitting: Obstetrics and Gynecology

## 2019-07-29 ENCOUNTER — Other Ambulatory Visit: Payer: Self-pay

## 2019-07-29 ENCOUNTER — Encounter: Payer: Self-pay | Admitting: Obstetrics and Gynecology

## 2019-07-29 ENCOUNTER — Ambulatory Visit (INDEPENDENT_AMBULATORY_CARE_PROVIDER_SITE_OTHER): Payer: BC Managed Care – PPO | Admitting: Obstetrics and Gynecology

## 2019-07-29 VITALS — BP 115/75

## 2019-07-29 DIAGNOSIS — F419 Anxiety disorder, unspecified: Secondary | ICD-10-CM

## 2019-07-29 DIAGNOSIS — F329 Major depressive disorder, single episode, unspecified: Secondary | ICD-10-CM | POA: Diagnosis not present

## 2019-07-29 DIAGNOSIS — F32A Depression, unspecified: Secondary | ICD-10-CM

## 2019-07-29 MED ORDER — BUPROPION HCL ER (XL) 150 MG PO TB24: 150.0000 mg | ORAL_TABLET | Freq: Every day | ORAL | 2 refills | Status: DC

## 2019-07-29 NOTE — Progress Notes (Signed)
Obstetrics & Gynecology Office Visit   Chief Complaint:  Chief Complaint  Patient presents with  . Follow-up    anxiety/Depression    History of Present Illness: The patient is a 29 y.o. female presenting follow up for symptoms of anxiety and depression.  The patient is currently taking cymbalta 15m for the management of her symptoms.  She has not had any recent situational stressors.  She reports symptoms of anhedonia, irritability, social anxiety, feelings of guilt and feelings of worthlessness.  She denies risk taking behavior, agorophobia, suicidal ideation, homicidal ideation, auditory hallucinations and visual hallucinations. Symptoms have remained unchanged since last visit.     The patient does have a pre-existing history of depression and anxiety.  She  does not a prior history of suicide attempts.    Review of Systems: Review of Systems  Constitutional: Negative.   Gastrointestinal: Negative for nausea.  Neurological: Negative for headaches.  Psychiatric/Behavioral: Positive for depression. Negative for hallucinations, memory loss, substance abuse and suicidal ideas. The patient is nervous/anxious. The patient does not have insomnia.      Past Medical History:  Past Medical History:  Diagnosis Date  . Anxiety   . BRCA negative 04/2018   MyRisk neg; IBIS=8.9%/riskscore=6.7%  . Eclampsia   . Family history of ovarian cancer   . Gestational diabetes   . Migraine headache   . PCOS (polycystic ovarian syndrome)     Past Surgical History:  Past Surgical History:  Procedure Laterality Date  . CESAREAN SECTION N/A 07/15/2014   Procedure: CESAREAN SECTION;  Surgeon: RGae Dry MD;  Location: [facility] ORS;  Service: Obstetrics;  Laterality: N/A;  . CESAREAN SECTION N/A 02/10/2018   Procedure: CESAREAN SECTION;  Surgeon: JWill Bonnet MD;  Location: [facility] ORS;  Service: Obstetrics;  Laterality: N/A;  . CESAREAN SECTION N/A 12/31/2018   Procedure: CESAREAN  SECTION;  Surgeon: SMalachy Mood MD;  Location: [facility] ORS;  Service: Obstetrics;  Laterality: N/A;  . HERNIA REPAIR  10/2013  . WISDOM TOOTH EXTRACTION      Gynecologic History: No LMP recorded. (Menstrual status: IUD).  Obstetric History: [id number] Family History:  Family History  Problem Relation Age of Onset  . Ovarian cancer Mother   . Arthritis Mother   . Diabetes Paternal Grandfather   . Diabetes Paternal Aunt   . Ovarian cancer Maternal Aunt   . Breast cancer Maternal Grandmother   . Uterine cancer Cousin     Social History:  Social History   Socioeconomic History  . Marital status: Married    Spouse name: CEinar Pheasant . Number of children: Not on file  . Years of education: Not on file  . Highest education level: Not on file  Occupational History  . Not on file  Tobacco Use  . Smoking status: Never Smoker  . Smokeless tobacco: Never Used  Vaping Use  . Vaping Use: Never used  Substance and Sexual Activity  . Alcohol use: No  . Drug use: Never  . Sexual activity: Yes    Birth control/protection: Other-see comments    Comment: will discuss with MD  Other Topics Concern  . Not on file  Social History Narrative  . Not on file   Social Determinants of Health   Financial Resource Strain:   . Difficulty of Paying Living Expenses:   Food Insecurity:   . Worried About RCharity fundraiserin the Last Year:   . [place]in the Last Year:  Transportation Needs:   . Film/video editor (Medical):   Marland Kitchen Lack of Transportation (Non-Medical):   Physical Activity:   . Days of Exercise per Week:   . Minutes of Exercise per Session:   Stress:   . Feeling of Stress :   Social Connections:   . Frequency of Communication with Friends and Family:   . Frequency of Social Gatherings with Friends and Family:   . Attends Religious Services:   . Active Member of Clubs or Organizations:   . Attends Archivist Meetings:   Marland Kitchen Marital Status:   Intimate  Partner Violence:   . Fear of Current or Ex-Partner:   . Emotionally Abused:   Marland Kitchen Physically Abused:   . Sexually Abused:     Allergies:  Allergies  Allergen Reactions  . Fish Allergy Shortness Of Breath  . Shellfish Allergy Shortness Of Breath    Medications: Prior to Admission medications   Medication Sig Start Date End Date Taking? Authorizing Provider  busPIRone (BUSPAR) 7.5 MG tablet TAKE 1 TABLET (7.5 MG TOTAL) BY MOUTH 2 (TWO) TIMES DAILY. 02/08/19  Yes [name], MD  hydrOXYzine (ATARAX/VISTARIL) 25 MG tablet Take 1 tablet (25 mg total) by mouth every 6 (six) hours as needed for anxiety. 05/29/19  Yes [name], MD  buPROPion (WELLBUTRIN XL) 150 MG 24 hr tablet Take 1 tablet (150 mg total) by mouth daily. 07/29/19   [name], MD    Physical Exam Vitals:  Vitals:   07/29/19 1206  BP: 115/75   No LMP recorded. (Menstrual status: IUD).  General: NAD HEENT: normocephalic, anicteric Pulmonary: No increased work of breathing Neurologic: Grossly intact Psychiatric: mood appropriate, affect full    GAD 7 : Generalized Anxiety Score 07/29/2019 07/05/2019 06/16/2019 04/28/2019  Nervous, Anxious, on Edge _0 Control/stop worrying _1 Worry too much - different things _2 Trouble relaxing _3 Restless _4 Easily annoyed or irritable _5 Afraid - awful might happen _6 Total GAD 7 Score _7 Anxiety Difficulty Very difficult Very difficult Extremely difficult Somewhat difficult    Depression screen [facility] 2/9 07/29/2019 07/05/2019 06/16/2019  Decreased Interest _8 Down, Depressed, Hopeless _9 PHQ - 2 Score _10 Altered sleeping _11 Tired, decreased energy _12 Change in appetite _13 Feeling bad or failure about yourself  _14 Trouble concentrating _15 Moving slowly or fidgety/restless _16 Suicidal thoughts 0 0 0  PHQ-9 Score _17 Difficult doing work/chores Very difficult Very  difficult Extremely dIfficult    Depression screen [facility] 2/9 07/29/2019 07/05/2019 06/16/2019 04/28/2019 02/10/2019  Decreased Interest _18 Down, Depressed, Hopeless _19 PHQ - 2 Score _20 Altered sleeping _21 Tired, decreased energy _22 Change in appetite _23 Feeling bad or failure about yourself  _24 Trouble concentrating _25 Moving slowly or fidgety/restless _26 0 1  Suicidal thoughts 0 0 0 0 0  PHQ-9 Score _27 Difficult doing work/chores Very difficult Very  difficult Extremely dIfficult Somewhat difficult Somewhat difficult     Assessment: 29 y.o. [id number] follow up anxiety and depression  Plan: Problem List Items Addressed This Visit    None    Visit Diagnoses    Anxiety and depression    -  Primary   Relevant Medications   buPROPion (WELLBUTRIN XL) 150 MG 24 hr tablet      1) Discontinue cymbalta  switch over to wellbutrin   2) Thyroid and B12 screen has been obtained previously   [name], MD, [place] [place] 07/29/2019, 12:31 PM

## 2019-07-30 NOTE — Telephone Encounter (Signed)
Advise

## 2019-08-11 ENCOUNTER — Other Ambulatory Visit: Payer: Self-pay

## 2019-08-11 MED ORDER — HYDROXYZINE HCL 25 MG PO TABS: 25.0000 mg | ORAL_TABLET | Freq: Four times a day (QID) | ORAL | 0 refills | Status: DC | PRN

## 2019-08-11 NOTE — Telephone Encounter (Signed)
advise

## 2019-08-20 ENCOUNTER — Other Ambulatory Visit: Payer: Self-pay | Admitting: Obstetrics and Gynecology

## 2019-08-20 NOTE — Telephone Encounter (Signed)
Advise

## 2019-08-23 ENCOUNTER — Other Ambulatory Visit: Payer: Self-pay | Admitting: Obstetrics and Gynecology

## 2019-08-23 MED ORDER — BUPROPION HCL ER (XL) 300 MG PO TB24: 300.0000 mg | ORAL_TABLET | Freq: Every day | ORAL | 2 refills | Status: DC

## 2019-08-25 ENCOUNTER — Ambulatory Visit (INDEPENDENT_AMBULATORY_CARE_PROVIDER_SITE_OTHER): Payer: BC Managed Care – PPO | Admitting: Obstetrics and Gynecology

## 2019-08-25 ENCOUNTER — Other Ambulatory Visit: Payer: Self-pay

## 2019-08-25 ENCOUNTER — Encounter: Payer: Self-pay | Admitting: Obstetrics and Gynecology

## 2019-08-25 VITALS — BP 122/78 | HR 113 | Wt 209.0 lb

## 2019-08-25 DIAGNOSIS — O99345 Other mental disorders complicating the puerperium: Secondary | ICD-10-CM

## 2019-08-25 DIAGNOSIS — F53 Postpartum depression: Secondary | ICD-10-CM

## 2019-08-25 NOTE — Progress Notes (Signed)
Obstetrics & Gynecology Office Visit   Chief Complaint:  Chief Complaint  Patient presents with  . Follow-up    History of Present Illness: The patient is a 29 y.o. female presenting follow up for symptoms of anxiety and depression.  The patient is currently taking Wellbutrin  for the management of her symptoms.  She has not had any new situational stressors.  Emerlyn's procedure at [facility] has gone well and she has actually had significant improvement in her aspiration episodes.  She reports symptoms of irritability, social anxiety and feelings of guilt.  She denies risk taking behavior, suicidal ideation, homicidal ideation, auditory hallucinations and visual hallucinations. Symptoms have improved since last visit.   Feels symptoms have significantly improved during the day time but are more pronounced towards the evenings.;  The patient does have a pre-existing history of depression and anxiety.  She  does not a prior history of suicide attempts.    Review of Systems: Review of Systems  Constitutional: Negative.   Gastrointestinal: Negative for nausea.  Neurological: Negative for headaches.  Psychiatric/Behavioral: Negative.      Past Medical History:  Past Medical History:  Diagnosis Date  . Anxiety   . BRCA negative 04/2018   MyRisk neg; IBIS=8.9%/riskscore=6.7%  . Eclampsia   . Family history of ovarian cancer   . Gestational diabetes   . Migraine headache   . PCOS (polycystic ovarian syndrome)     Past Surgical History:  Past Surgical History:  Procedure Laterality Date  . CESAREAN SECTION N/A 07/15/2014   Procedure: CESAREAN SECTION;  Surgeon: Gae Dry, MD;  Location: [facility] ORS;  Service: Obstetrics;  Laterality: N/A;  . CESAREAN SECTION N/A 02/10/2018   Procedure: CESAREAN SECTION;  Surgeon: Will Bonnet, MD;  Location: [facility] ORS;  Service: Obstetrics;  Laterality: N/A;  . CESAREAN SECTION N/A 12/31/2018   Procedure: CESAREAN SECTION;  Surgeon: [name], MD;  Location: [facility] ORS;  Service: Obstetrics;  Laterality: N/A;  . HERNIA REPAIR  10/2013  . WISDOM TOOTH EXTRACTION      Gynecologic History: No LMP recorded. (Menstrual status: IUD).  Obstetric History: [id number]  Family History:  Family History  Problem Relation Age of Onset  . Ovarian cancer Mother   . Arthritis Mother   . Diabetes Paternal Grandfather   . Diabetes Paternal Aunt   . Ovarian cancer Maternal Aunt   . Breast cancer Maternal Grandmother   . Uterine cancer Cousin     Social History:  Social History   Socioeconomic History  . Marital status: Married    Spouse name: Einar Pheasant  . Number of children: Not on file  . Years of education: Not on file  . Highest education level: Not on file  Occupational History  . Not on file  Tobacco Use  . Smoking status: Never Smoker  . Smokeless tobacco: Never Used  Vaping Use  . Vaping Use: Never used  Substance and Sexual Activity  . Alcohol use: No  . Drug use: Never  . Sexual activity: Yes    Birth control/protection: Other-see comments    Comment: will discuss with MD  Other Topics Concern  . Not on file  Social History Narrative  . Not on file   Social Determinants of Health   Financial Resource Strain:   . Difficulty of Paying Living Expenses:   Food Insecurity:   . Worried About Charity fundraiser in the Last Year:   . Tony in the  Last Year:   Transportation Needs:   . Film/video editor (Medical):   Marland Kitchen Lack of Transportation (Non-Medical):   Physical Activity:   . Days of Exercise per Week:   . Minutes of Exercise per Session:   Stress:   . Feeling of Stress :   Social Connections:   . Frequency of Communication with Friends and Family:   . Frequency of Social Gatherings with Friends and Family:   . Attends Religious Services:   . Active Member of Clubs or Organizations:   . Attends Archivist Meetings:   Marland Kitchen Marital Status:   Intimate Partner Violence:   . Fear of  Current or Ex-Partner:   . Emotionally Abused:   Marland Kitchen Physically Abused:   . Sexually Abused:     Allergies:  Allergies  Allergen Reactions  . Fish Allergy Shortness Of Breath  . Shellfish Allergy Shortness Of Breath    Medications: Prior to Admission medications   Medication Sig Start Date End Date Taking? Authorizing Provider  buPROPion (WELLBUTRIN XL) 300 MG 24 hr tablet Take 1 tablet (300 mg total) by mouth daily. 08/23/19  Yes [name], MD  busPIRone (BUSPAR) 7.5 MG tablet TAKE 1 TABLET (7.5 MG TOTAL) BY MOUTH 2 (TWO) TIMES DAILY. 08/03/19  Yes [name], MD  hydrOXYzine (ATARAX/VISTARIL) 25 MG tablet Take 1 tablet (25 mg total) by mouth every 6 (six) hours as needed for anxiety. 08/11/19  Yes [name], MD    Physical Exam Vitals:  Vitals:   08/25/19 1206  BP: 122/78  Pulse: (!) 113   No LMP recorded. (Menstrual status: IUD).  General: NAD HEENT: normocephalic, anicteric Pulmonary: No increased work of breathing Neurologic: Grossly intact Psychiatric: mood appropriate, affect full    GAD 7 : Generalized Anxiety Score 08/25/2019 07/29/2019 07/05/2019 06/16/2019  Nervous, Anxious, on Edge 2 3 2 3   Control/stop worrying 2 2 2 3   Worry too much - different things 3 2 2 3   Trouble relaxing 3 3 2 3   Restless 3 3 2 1   Easily annoyed or irritable 3 3 2 3   Afraid - awful might happen 2 2 2 2   Total GAD 7 Score 18 18 14 18   Anxiety Difficulty - Very difficult Very difficult Extremely difficult    Depression screen [facility] 2/9 08/25/2019 07/29/2019 07/05/2019  Decreased Interest 2 2 2   Down, Depressed, Hopeless 2 3 2   PHQ - 2 Score 4 5 4   Altered sleeping 3 3 2   Tired, decreased energy 1 3 2   Change in appetite 3 1 2   Feeling bad or failure about yourself  2 2 2   Trouble concentrating 3 2 2   Moving slowly or fidgety/restless 3 2 1   Suicidal thoughts 0 0 0  PHQ-9 Score 19 18 15   Difficult doing work/chores - Very difficult Very difficult  Some recent  data might be hidden    Depression screen [facility] 2/9 08/25/2019 07/29/2019 07/05/2019 06/16/2019 04/28/2019  Decreased Interest 2 2 2 2 1   Down, Depressed, Hopeless 2 3 2 3 2   PHQ - 2 Score 4 5 4 5 3   Altered sleeping 3 3 2 3 2   Tired, decreased energy 1 3 2 3 2   Change in appetite 3 1 2 2 2   Feeling bad or failure about yourself  2 2 2 2 1   Trouble concentrating 3 2 2 3 2   Moving slowly or fidgety/restless 3 2 1 2  0  Suicidal thoughts 0 0 0 0 0  PHQ-9  Score 19 18 15 20 12   Difficult doing work/chores - Very difficult Very difficult Extremely dIfficult Somewhat difficult  Some recent data might be hidden     Assessment: 29 y.o. [id number] follow up postpartum depression and anxiety  Plan: Problem List Items Addressed This Visit    None    Visit Diagnoses    Postpartum depression    -  Primary      1) Doing much better during the day but feels like symptoms worsening towards end of the day evening.  Increased Wellbutrin to 349m but discussed going to bid dosing on shorter acting Wellbutrin as an alternative  2) Thyroid and B12 screen has been obtained previously  3) A total of 15 minutes were spent in face-to-face contact with the patient during this encounter with over half of that time devoted to counseling and coordination of care.  4) Return in about 4 weeks (around 09/22/2019) for medication follow up.    AMalachy Mood MD, [facility], [facility] 08/25/2019, 12:08 PM

## 2019-09-10 ENCOUNTER — Ambulatory Visit
Admission: EM | Admit: 2019-09-10 | Discharge: 2019-09-10 | Disposition: A | Discharge status: Home / Self Care | Payer: BC Managed Care – PPO | Attending: Internal Medicine | Admitting: Internal Medicine

## 2019-09-10 ENCOUNTER — Other Ambulatory Visit: Payer: Self-pay

## 2019-09-10 DIAGNOSIS — S2341XA Sprain of ribs, initial encounter: Secondary | ICD-10-CM | POA: Diagnosis not present

## 2019-09-10 NOTE — ED Provider Notes (Signed)
[facility]    CSN: [id number] Arrival date & time: 09/10/19  1330      History   Chief Complaint Chief Complaint  Patient presents with  . rib pain    HPI Denise Walker is a 29 y.o. female comes to the urgent care with complaints of left-sided rib pain which started yesterday.  Patient symptoms started after she bent over and picked up a 50-year-old kid.  Pain is sharp, currently of moderate severity.  Aggravated by palpation.  No known relieving factors.  She denies any shortness of breath, cough or sputum production.  No calf swelling or pain.  No long distance travel.Marland Kitchen   HPI  Past Medical History:  Diagnosis Date  . Anxiety   . BRCA negative 04/2018   MyRisk neg; IBIS=8.9%/riskscore=6.7%  . Eclampsia   . Family history of ovarian cancer   . Gestational diabetes   . Migraine headache   . PCOS (polycystic ovarian syndrome)     Patient Active Problem List   Diagnosis Date Noted  . Uterine scar from previous cesarean delivery affecting pregnancy 12/31/2018  . Gestational diabetes 11/03/2018  . Anti-D antibodies present during pregnancy 31-Jul-2018  . Short interval between pregnancies affecting pregnancy, antepartum 06/02/2018  . History of gestational diabetes 06/02/2018  . Maternal obesity, antepartum 06/02/2018  . Supervision of high risk pregnancy, antepartum 06/02/2018  . BRCA gene mutation negative 05/04/2018  . History of cesarean delivery 02/10/2018  . Rh negative state in antepartum period 08/23/2017  . History of eclampsia 07/22/2017  . Migraines 04/09/2017    Past Surgical History:  Procedure Laterality Date  . CESAREAN SECTION N/A 07/15/2014   Procedure: CESAREAN SECTION;  Surgeon: Gae Dry, MD;  Location: [facility] ORS;  Service: Obstetrics;  Laterality: N/A;  . CESAREAN SECTION N/A 02/10/2018   Procedure: CESAREAN SECTION;  Surgeon: Will Bonnet, MD;  Location: [facility] ORS;  Service: Obstetrics;  Laterality: N/A;  . CESAREAN SECTION  N/A 12/31/2018   Procedure: CESAREAN SECTION;  Surgeon: [name], MD;  Location: [facility] ORS;  Service: Obstetrics;  Laterality: N/A;  . HERNIA REPAIR  10/2013  . WISDOM TOOTH EXTRACTION      OB History    Gravida  6   Para  3   Term  3   Preterm      AB  3   Living  3     SAB  3   TAB      Ectopic      Multiple  0   Live Births  3            Home Medications    Prior to Admission medications   Medication Sig Start Date End Date Taking? Authorizing Provider  buPROPion (WELLBUTRIN XL) 300 MG 24 hr tablet Take 1 tablet (300 mg total) by mouth daily. 08/23/19  Yes [name], MD  busPIRone (BUSPAR) 7.5 MG tablet TAKE 1 TABLET (7.5 MG TOTAL) BY MOUTH 2 (TWO) TIMES DAILY. 08/03/19  Yes [name], MD  hydrOXYzine (ATARAX/VISTARIL) 25 MG tablet Take 1 tablet (25 mg total) by mouth every 6 (six) hours as needed for anxiety. 08/11/19  Yes [name], MD    Family History Family History  Problem Relation Age of Onset  . Ovarian cancer Mother   . Arthritis Mother   . Diabetes Paternal Grandfather   . Diabetes Paternal Aunt   . Ovarian cancer Maternal Aunt   . Breast cancer Maternal Grandmother   . Uterine cancer Cousin  Social History Social History   Tobacco Use  . Smoking status: Never Smoker  . Smokeless tobacco: Never Used  Vaping Use  . Vaping Use: Never used  Substance Use Topics  . Alcohol use: No  . Drug use: Never     Allergies   Fish allergy and Shellfish allergy   Review of Systems Review of Systems  HENT: Negative.   Gastrointestinal: Negative.   Musculoskeletal: Positive for myalgias.  Skin: Negative.   Neurological: Negative.      Physical Exam Triage Vital Signs ED Triage Vitals  Enc Vitals Group     BP 09/10/19 1430 (!) 134/99     Pulse Rate 09/10/19 1430 69     Resp 09/10/19 1430 18     Temp 09/10/19 1430 98.2 F (36.8 C)     Temp Source 09/10/19 1430 Oral     SpO2 09/10/19 1430 100 %      Weight 09/10/19 1428 190 lb (86.2 kg)     Height 09/10/19 1428 _0  (1.499 m)     Head Circumference --      Peak Flow --      Pain Score 09/10/19 1428 7     Pain Loc --      Pain Edu? --      Excl. in [place]? --    No data found.  Updated Vital Signs BP (!) 134/99 (BP Location: Left Arm)   Pulse 69   Temp 98.2 F (36.8 C) (Oral)   Resp 18   Ht _1  (1.499 m)   Wt 86.2 kg   SpO2 100%   BMI 38.38 kg/m   Visual Acuity Right Eye Distance:   Left Eye Distance:   Bilateral Distance:    Right Eye Near:   Left Eye Near:    Bilateral Near:     Physical Exam Vitals and nursing note reviewed.  Constitutional:      General: She is not in acute distress.    Appearance: She is not ill-appearing.  Cardiovascular:     Rate and Rhythm: Normal rate and regular rhythm.     Pulses: Normal pulses.     Heart sounds: Normal heart sounds.  Pulmonary:     Effort: Pulmonary effort is normal.     Breath sounds: Normal breath sounds. No wheezing or rhonchi.  Abdominal:     General: Bowel sounds are normal.     Palpations: There is no mass.     Tenderness: There is no abdominal tenderness.  Musculoskeletal:        General: Normal range of motion.     Comments: Point tenderness over the ribs on the left costal cartilages on the left side around the xiphoid process.  Neurological:     Mental Status: She is alert.      UC Treatments / Results  Labs (all labs ordered are listed, but only abnormal results are displayed) Labs Reviewed - No data to display  EKG   Radiology No results found.  Procedures Procedures (including critical care time)  Medications Ordered in UC Medications - No data to display  Initial Impression / Assessment and Plan / UC Course  I have reviewed the triage vital signs and the nursing notes.  Pertinent labs & imaging results that were available during my care of the patient were reviewed by me and considered in my medical decision making (see chart  for details).     1.  Rib sprain: Rest, gentle range of motion exercises  Heating pad Tylenol/Motrin as needed for pain and/or fever. If patient symptoms worsens or if he develops any shortness of breath she is advised to return to urgent care to be reevaluated. Final Clinical Impressions(s) / UC Diagnoses   Final diagnoses:  Rib sprain, initial encounter     Discharge Instructions     Please apply heating pad Gentle range of motion exercises If pain worsens or if you develop shortness of breath please return to urgent care to be reevaluated.   ED Prescriptions    None     PDMP not reviewed this encounter.   Chase Picket, MD 09/10/19 [phone]

## 2019-09-10 NOTE — Discharge Instructions (Signed)
Please apply heating pad Gentle range of motion exercises If pain worsens or if you develop shortness of breath please return to urgent care to be reevaluated.

## 2019-09-10 NOTE — ED Triage Notes (Signed)
Patient complains of left sided rib pain that started yesterday. States that she the pain is worse when she bends over or pickups her kids. Denies any known injury. Patient states that the pain is almost like a pinch.

## 2019-09-14 ENCOUNTER — Other Ambulatory Visit: Payer: Self-pay | Admitting: Obstetrics and Gynecology

## 2019-09-14 NOTE — Telephone Encounter (Signed)
Advise

## 2019-09-16 ENCOUNTER — Other Ambulatory Visit: Payer: Self-pay | Admitting: Neurology

## 2019-09-16 DIAGNOSIS — M5416 Radiculopathy, lumbar region: Secondary | ICD-10-CM

## 2019-09-20 ENCOUNTER — Other Ambulatory Visit: Payer: Self-pay | Admitting: Obstetrics and Gynecology

## 2019-09-20 DIAGNOSIS — F32A Depression, unspecified: Secondary | ICD-10-CM

## 2019-09-22 ENCOUNTER — Encounter: Payer: Self-pay | Admitting: Obstetrics and Gynecology

## 2019-09-22 ENCOUNTER — Ambulatory Visit (INDEPENDENT_AMBULATORY_CARE_PROVIDER_SITE_OTHER): Payer: BC Managed Care – PPO | Admitting: Obstetrics and Gynecology

## 2019-09-22 ENCOUNTER — Other Ambulatory Visit: Payer: Self-pay

## 2019-09-22 VITALS — BP 114/82 | HR 130 | Wt 206.0 lb

## 2019-09-22 DIAGNOSIS — F329 Major depressive disorder, single episode, unspecified: Secondary | ICD-10-CM | POA: Diagnosis not present

## 2019-09-22 DIAGNOSIS — F419 Anxiety disorder, unspecified: Secondary | ICD-10-CM | POA: Diagnosis not present

## 2019-09-22 DIAGNOSIS — F32A Depression, unspecified: Secondary | ICD-10-CM

## 2019-09-22 MED ORDER — VORTIOXETINE HBR 10 MG PO TABS: 10.0000 mg | ORAL_TABLET | Freq: Every day | ORAL | 2 refills | Status: DC

## 2019-09-22 NOTE — Progress Notes (Signed)
Obstetrics & Gynecology Office Visit   Chief Complaint:  Chief Complaint  Patient presents with   Follow-up    History of Present Illness: The patient is a 29 y.o. female presenting follow up for symptoms of anxiety and depression.  The patient is currently taking Wellbutrin XL 345m daily and Buspar 7.520mpo bid with prn hydroxyzine  for the management of her symptoms.  She has had any recent situational stressors.  Emerlyn has [facility] and [facility] going much better.  [facility]  This precipitated the acute worsening in the patient's symptoms.  She does report having intrusive thoughts of something bad happening while she is driving but denies SI/HI.  She reports symptoms of anhedonia, insomnia, irritability, social anxiety, agorophobia, feelings of guilt and feelings of worthlessness.  She denies risk taking behavior, suicidal ideation, homicidal ideation, auditory hallucinations and visual hallucinations. Symptoms have worsened since last visit.     The patient does have a pre-existing history of depression and anxiety.  She  does not a prior history of suicide attempts.  [facility]  Previous treatment tied include serorquel, remeron, cymbalta, Zoloft, Lexapro, Celexa, Prozac, Welbutrin, Buspirone and Counseling  Review of Systems: Review of systems negative unless noted in HPI  Past Medical History:  Past Medical History:  Diagnosis Date   Anxiety    BRCA negative 04/2018   MyRisk neg; IBIS=8.9%/riskscore=6.7%   Eclampsia    Family history of ovarian cancer    Gestational diabetes    Migraine headache    PCOS (polycystic ovarian syndrome)     Past Surgical History:  Past Surgical History:  Procedure Laterality Date   CESAREAN  SECTION N/A 07/15/2014   Procedure: CESAREAN SECTION;  Surgeon: RoGae DryMD;  Location: [facility] ORS;  Service: Obstetrics;  Laterality: N/A;   CESAREAN SECTION N/A 02/10/2018   Procedure: CESAREAN SECTION;  Surgeon: JaWill BonnetMD;  Location: [facility] ORS;  Service: Obstetrics;  Laterality: N/A;   CESAREAN SECTION N/A 12/31/2018   Procedure: CESAREAN SECTION;  Surgeon: StMalachy MoodMD;  Location: [facility] ORS;  Service: Obstetrics;  Laterality: N/A;   HERNIA REPAIR  10/2013   WISDOM TOOTH EXTRACTION      Gynecologic History: No LMP recorded. (Menstrual status: IUD).  Obstetric History: [id number] History:  Family History  Problem Relation Age of Onset   Ovarian cancer Mother    Arthritis Mother    Diabetes Paternal Grandfather    Diabetes Paternal Aunt    Ovarian cancer Maternal Aunt    Breast cancer Maternal Grandmother    Uterine cancer Cousin     Social History:  Social History   Socioeconomic History   Marital status: Married    Spouse name: CoEinar Pheasant Number of children: Not on file   Years of education: Not on file   Highest education level: Not on file  Occupational History   Not on file  Tobacco Use   Smoking status: Never Smoker   Smokeless tobacco: Never Used  VaScientific laboratory technicianse: Never used  Substance and Sexual Activity   Alcohol use: No   Drug use: Never   Sexual activity: Yes    Birth control/protection: Other-see comments    Comment: will discuss with MD  Other Topics  Concern   Not on file  Social History Narrative   Not on file   Social Determinants of Health   Financial Resource Strain:    Difficulty of Paying Living Expenses:   Food Insecurity:    Worried About Charity fundraiser in the Last Year:    Arboriculturist in the Last Year:   Transportation Needs:    Film/video editor (Medical):    Lack of Transportation (Non-Medical):   Physical Activity:    Days of Exercise per Week:     Minutes of Exercise per Session:   Stress:    Feeling of Stress :   Social Connections:    Frequency of Communication with Friends and Family:    Frequency of Social Gatherings with Friends and Family:    Attends Religious Services:    Active Member of Clubs or Organizations:    Attends Archivist Meetings:    Marital Status:   Intimate Partner Violence:    Fear of Current or Ex-Partner:    Emotionally Abused:    Physically Abused:    Sexually Abused:     Allergies:  Allergies  Allergen Reactions   Fish Allergy Shortness Of Breath   Shellfish Allergy Shortness Of Breath    Medications: Prior to Admission medications   Medication Sig Start Date End Date Taking? Authorizing Provider  busPIRone (BUSPAR) 7.5 MG tablet TAKE 1 TABLET (7.5 MG TOTAL) BY MOUTH 2 (TWO) TIMES DAILY. 08/03/19   [name], MD  hydrOXYzine (ATARAX/VISTARIL) 25 MG tablet Take 1 tablet (25 mg total) by mouth every 6 (six) hours as needed for anxiety. 08/11/19   [name], MD  vortioxetine HBr (TRINTELLIX) 10 MG TABS tablet Take 1 tablet (10 mg total) by mouth daily. 09/22/19   [name], MD    Physical Exam Vitals:  Vitals:   09/22/19 1400  BP: 114/82  Pulse: (!) 130   No LMP recorded. (Menstrual status: IUD).  General: NAD HEENT: normocephalic, anicteric Pulmonary: No increased work of breathing Neurologic: Grossly intact Psychiatric: mood appropriate, affect full    GAD 7 : Generalized Anxiety Score 08/25/2019 07/29/2019 07/05/2019 06/16/2019  Nervous, Anxious, on Edge 2 3 2 3   Control/stop worrying 2 2 2 3   Worry too much - different things 3 2 2 3   Trouble relaxing 3 3 2 3   Restless 3 3 2 1   Easily annoyed or irritable 3 3 2 3   Afraid - awful might happen 2 2 2 2   Total GAD 7 Score 18 18 14 18   Anxiety Difficulty - Very difficult Very difficult Extremely difficult    Depression screen [facility] 2/9 08/25/2019 07/29/2019 07/05/2019  Decreased Interest 2 2  2   Down, Depressed, Hopeless 2 3 2   PHQ - 2 Score 4 5 4   Altered sleeping 3 3 2   Tired, decreased energy 1 3 2   Change in appetite 3 1 2   Feeling bad or failure about yourself  2 2 2   Trouble concentrating 3 2 2   Moving slowly or fidgety/restless 3 2 1   Suicidal thoughts 0 0 0  PHQ-9 Score 19 18 15   Difficult doing work/chores - Very difficult Very difficult  Some recent data might be hidden    Depression screen [facility] 2/9 08/25/2019 07/29/2019 07/05/2019 06/16/2019 04/28/2019  Decreased Interest 2 2 2 2 1   Down, Depressed, Hopeless 2 3 2 3 2   PHQ - 2 Score 4 5 4 5 3   Altered sleeping 3 3 2 3  2  Tired, decreased energy 1 3 2 3 2   Change in appetite 3 1 2 2 2   Feeling bad or failure about yourself  2 2 2 2 1   Trouble concentrating 3 2 2 3 2   Moving slowly or fidgety/restless 3 2 1 2  0  Suicidal thoughts 0 0 0 0 0  PHQ-9 Score 19 18 15 20 12   Difficult doing work/chores - Very difficult Very difficult Extremely dIfficult Somewhat difficult  Some recent data might be hidden     Assessment: 29 y.o. [id number] follow up anxiety and depression  Plan: Problem List Items Addressed This Visit    None    Visit Diagnoses    Anxiety and depression    -  Primary   Relevant Medications   vortioxetine HBr (TRINTELLIX) 10 MG TABS tablet   Other Relevant Orders   CBC   B12   Thyroid Panel With TSH      1) Anxiety/Depression  - D/C Wellbturin and start Trintellix. Referral to Cephus Shelling.  2) Thyroid and B12 screen was obtained today  3) A total of 15 minutes were spent in face-to-face contact with the patient during this encounter with over half of that time devoted to counseling and coordination of care.  4) Return in 1 week (on 09/29/2019), or to call once seen by Mitzie Na, for Medication follow up.   [name], MD, [facility], [facility] 09/22/2019, 2:26 PM

## 2019-09-23 LAB — THYROID PANEL WITH TSH
Free Thyroxine Index: 1.6 (ref 1.2–4.9)
T3 Uptake Ratio: 25 % (ref 24–39)
T4, Total: 6.4 ug/dL (ref 4.5–12.0)
TSH: 4.35 u[IU]/mL (ref 0.450–4.500)

## 2019-09-23 LAB — CBC
Hematocrit: 44.6 % (ref 34.0–46.6)
Hemoglobin: 14.6 g/dL (ref 11.1–15.9)
MCH: 28.5 pg (ref 26.6–33.0)
MCHC: 32.7 g/dL (ref 31.5–35.7)
MCV: 87 fL (ref 79–97)
Platelets: 294 10*3/uL (ref 150–450)
RBC: 5.12 x10E6/uL (ref 3.77–5.28)
RDW: 14.5 % (ref 11.7–15.4)
WBC: 8.7 10*3/uL (ref 3.4–10.8)

## 2019-09-23 LAB — VITAMIN B12: Vitamin B-12: 587 pg/mL (ref 232–1245)

## 2019-09-27 NOTE — Telephone Encounter (Signed)
I called pt to adv that I faxed her referral to Dr Maryruth Bun on 09/21/19. I gave the pt the phone number to Kapur's office.

## 2019-10-02 ENCOUNTER — Other Ambulatory Visit: Payer: Self-pay

## 2019-10-02 ENCOUNTER — Ambulatory Visit
Admission: RE | Admit: 2019-10-02 | Discharge: 2019-10-02 | Disposition: A | Discharge status: Home / Self Care | Payer: BC Managed Care – PPO | Source: Ambulatory Visit | Attending: Neurology | Admitting: Neurology

## 2019-10-02 DIAGNOSIS — M5416 Radiculopathy, lumbar region: Secondary | ICD-10-CM | POA: Diagnosis not present

## 2019-10-06 ENCOUNTER — Encounter: Payer: Self-pay | Admitting: Obstetrics and Gynecology

## 2019-10-06 ENCOUNTER — Ambulatory Visit (INDEPENDENT_AMBULATORY_CARE_PROVIDER_SITE_OTHER): Payer: BC Managed Care – PPO | Admitting: Obstetrics and Gynecology

## 2019-10-06 ENCOUNTER — Other Ambulatory Visit: Payer: Self-pay

## 2019-10-06 VITALS — Wt 208.0 lb

## 2019-10-06 DIAGNOSIS — F329 Major depressive disorder, single episode, unspecified: Secondary | ICD-10-CM

## 2019-10-06 DIAGNOSIS — F32A Depression, unspecified: Secondary | ICD-10-CM

## 2019-10-06 DIAGNOSIS — F419 Anxiety disorder, unspecified: Secondary | ICD-10-CM

## 2019-10-06 NOTE — Progress Notes (Signed)
No charge visit, has been able to get in and establish with Dr. Maryruth Bun.

## 2019-10-11 NOTE — Addendum Note (Signed)
Addended by: Lorrene Reid on: 10/11/2019 01:29 PM   Modules accepted: Level of Service

## 2019-11-18 ENCOUNTER — Ambulatory Visit (INDEPENDENT_AMBULATORY_CARE_PROVIDER_SITE_OTHER): Payer: BC Managed Care – PPO | Admitting: Obstetrics and Gynecology

## 2019-11-18 ENCOUNTER — Other Ambulatory Visit: Payer: Self-pay

## 2019-11-18 DIAGNOSIS — F419 Anxiety disorder, unspecified: Secondary | ICD-10-CM

## 2019-11-18 DIAGNOSIS — F32A Depression, unspecified: Secondary | ICD-10-CM

## 2019-11-18 NOTE — Progress Notes (Signed)
I connected with Denise Walker on 11/26/19 at  1:50 PM EDT by telephone and verified that I am speaking with the correct person using two identifiers.   I discussed the limitations, risks, security and privacy concerns of performing an evaluation and management service by telephone and the availability of in person appointments. I also discussed with the patient that there may be a patient responsible charge related to this service. The patient expressed understanding and agreed to proceed.  The patient was at home I spoke with the patient from my workstation phone The names of people involved in this encounter were: Denise Walker , and [facility] Visit   Chief Complaint: No chief complaint on file.   History of Present Illness: The patient is a 29 y.o. female presenting follow up for symptoms of anxiety and depression.  .  She is doing well and has transitioned care to psychiatry at this point.  Review of Systems: Review of Systems  Constitutional: Negative.   Gastrointestinal: Negative for nausea.  Neurological: Negative for headaches.  Psychiatric/Behavioral: Negative for depression, hallucinations, substance abuse and suicidal ideas. The patient is nervous/anxious and has insomnia.      Past Medical History:  Past Medical History:  Diagnosis Date  . Anxiety   . BRCA negative 04/2018   MyRisk neg; IBIS=8.9%/riskscore=6.7%  . Eclampsia   . Family history of ovarian cancer   . Gestational diabetes   . Migraine headache   . PCOS (polycystic ovarian syndrome)     Past Surgical History:  Past Surgical History:  Procedure Laterality Date  . CESAREAN SECTION N/A 07/15/2014   Procedure: CESAREAN SECTION;  Surgeon: Gae Dry, MD;  Location: [facility] ORS;  Service: Obstetrics;  Laterality: N/A;  . CESAREAN SECTION N/A 02/10/2018   Procedure: CESAREAN SECTION;  Surgeon: Will Bonnet, MD;  Location: [facility] ORS;  Service: Obstetrics;   Laterality: N/A;  . CESAREAN SECTION N/A 12/31/2018   Procedure: CESAREAN SECTION;  Surgeon: [name], MD;  Location: [facility] ORS;  Service: Obstetrics;  Laterality: N/A;  . HERNIA REPAIR  10/2013  . WISDOM TOOTH EXTRACTION      Gynecologic History: No LMP recorded. (Menstrual status: IUD).  Obstetric History: [id number]  Family History:  Family History  Problem Relation Age of Onset  . Ovarian cancer Mother   . Arthritis Mother   . Diabetes Paternal Grandfather   . Diabetes Paternal Aunt   . Ovarian cancer Maternal Aunt   . Breast cancer Maternal Grandmother   . Uterine cancer Cousin     Social History:  Social History   Socioeconomic History  . Marital status: Married    Spouse name: Einar Pheasant  . Number of children: Not on file  . Years of education: Not on file  . Highest education level: Not on file  Occupational History  . Not on file  Tobacco Use  . Smoking status: Never Smoker  . Smokeless tobacco: Never Used  Vaping Use  . Vaping Use: Never used  Substance and Sexual Activity  . Alcohol use: No  . Drug use: Never  . Sexual activity: Yes    Birth control/protection: Other-see comments    Comment: will discuss with MD  Other Topics Concern  . Not on file  Social History Narrative  . Not on file   Social Determinants of Health   Financial Resource Strain:   . Difficulty of Paying Living Expenses: Not on file  Food Insecurity:   .  Worried About Charity fundraiser in the Last Year: Not on file  . Ran Out of Food in the Last Year: Not on file  Transportation Needs:   . Lack of Transportation (Medical): Not on file  . Lack of Transportation (Non-Medical): Not on file  Physical Activity:   . Days of Exercise per Week: Not on file  . Minutes of Exercise per Session: Not on file  Stress:   . Feeling of Stress : Not on file  Social Connections:   . Frequency of Communication with Friends and Family: Not on file  . Frequency of Social Gatherings with  Friends and Family: Not on file  . Attends Religious Services: Not on file  . Active Member of Clubs or Organizations: Not on file  . Attends Archivist Meetings: Not on file  . Marital Status: Not on file  Intimate Partner Violence:   . Fear of Current or Ex-Partner: Not on file  . Emotionally Abused: Not on file  . Physically Abused: Not on file  . Sexually Abused: Not on file    Allergies:  Allergies  Allergen Reactions  . Fish Allergy Shortness Of Breath  . Shellfish Allergy Shortness Of Breath    Medications: Prior to Admission medications   Medication Sig Start Date End Date Taking? Authorizing Provider  busPIRone (BUSPAR) 7.5 MG tablet TAKE 1 TABLET (7.5 MG TOTAL) BY MOUTH 2 (TWO) TIMES DAILY. 08/03/19  Yes [name], MD  citalopram (CELEXA) 20 MG tablet Take 20 mg by mouth daily. 10/04/19  Yes [provider]  hydrOXYzine (ATARAX/VISTARIL) 25 MG tablet Take 1 tablet (25 mg total) by mouth every 6 (six) hours as needed for anxiety. 08/11/19  Yes [name], MD  traZODone (DESYREL) 50 MG tablet  10/04/19  Yes [provider]  vortioxetine HBr (TRINTELLIX) 10 MG TABS tablet Take 1 tablet (10 mg total) by mouth daily. 09/22/19  Yes [name], MD  lamoTRIgine (LAMICTAL) 25 MG tablet Take by mouth. 11/06/19   [provider]    Physical Exam Vitals: There were no vitals filed for this visit. No LMP recorded. (Menstrual status: IUD).  No physical exam as this was a remote telephone visit to promote social distancing during the current COVID-19 Pandemic     GAD 7 : Generalized Anxiety Score 11/18/2019 08/25/2019 07/29/2019 07/05/2019  Nervous, Anxious, on Edge _0 Control/stop worrying _1 Worry too much - different things _2 Trouble relaxing _3 Restless _4 Easily annoyed or irritable _5 Afraid - awful might happen _6 Total GAD 7 Score _7 Anxiety Difficulty Very  difficult - Very difficult Very difficult    Depression screen [facility] 2/9 11/18/2019 08/25/2019 07/29/2019  Decreased Interest _8 Down, Depressed, Hopeless _9 PHQ - 2 Score _10 Altered sleeping _11 Tired, decreased energy _12 Change in appetite _13 Feeling bad or failure about yourself  _14 Trouble concentrating _15 Moving slowly or fidgety/restless 0 3 2  Suicidal thoughts 0 0 0  PHQ-9 Score _16 Difficult doing work/chores Very difficult - Very difficult  Some recent data might be hidden    Depression screen [facility] 2/9 11/18/2019 08/25/2019 07/29/2019 07/05/2019 06/16/2019  Decreased  Interest _0 Down, Depressed, Hopeless _1 PHQ - 2 Score _2 Altered sleeping _3 Tired, decreased energy _4 Change in appetite _5 Feeling bad or failure about yourself  _6 Trouble concentrating _7 Moving slowly or fidgety/restless 0 _8 Suicidal thoughts 0 0 0 0 0  PHQ-9 Score _9 Difficult doing work/chores Very difficult - Very difficult Very difficult Extremely dIfficult  Some recent data might be hidden     Assessment: 29 y.o. [id number] follow up anxiety and depression  Plan: Problem List Items Addressed This Visit    None    Visit Diagnoses    Anxiety and depression    -  Primary      1) Anxiety/Depression - care transitioned to psychiatry  2) Telephone time 12:63mn  3) Thyroid and B12 screen has been obtained previously  4) Return in about 6 months (around 05/18/2020) for annual.    AMalachy Mood MD, [facility] 11/18/2019, 2:07 PM

## 2020-02-25 DIAGNOSIS — M5442 Lumbago with sciatica, left side: Secondary | ICD-10-CM | POA: Diagnosis not present

## 2020-02-25 DIAGNOSIS — G8929 Other chronic pain: Secondary | ICD-10-CM | POA: Diagnosis not present

## 2020-02-25 DIAGNOSIS — M48061 Spinal stenosis, lumbar region without neurogenic claudication: Secondary | ICD-10-CM | POA: Diagnosis not present

## 2020-03-09 DIAGNOSIS — M48061 Spinal stenosis, lumbar region without neurogenic claudication: Secondary | ICD-10-CM | POA: Diagnosis not present

## 2020-03-09 DIAGNOSIS — M5442 Lumbago with sciatica, left side: Secondary | ICD-10-CM | POA: Diagnosis not present

## 2020-03-17 DIAGNOSIS — M25512 Pain in left shoulder: Secondary | ICD-10-CM | POA: Diagnosis not present

## 2020-03-17 DIAGNOSIS — M5442 Lumbago with sciatica, left side: Secondary | ICD-10-CM | POA: Diagnosis not present

## 2020-03-17 DIAGNOSIS — M48061 Spinal stenosis, lumbar region without neurogenic claudication: Secondary | ICD-10-CM | POA: Diagnosis not present

## 2020-03-17 DIAGNOSIS — M25511 Pain in right shoulder: Secondary | ICD-10-CM | POA: Diagnosis not present

## 2020-03-17 DIAGNOSIS — G8929 Other chronic pain: Secondary | ICD-10-CM | POA: Diagnosis not present

## 2020-04-03 ENCOUNTER — Other Ambulatory Visit: Payer: Self-pay | Admitting: Obstetrics and Gynecology

## 2020-04-03 DIAGNOSIS — G8929 Other chronic pain: Secondary | ICD-10-CM | POA: Diagnosis not present

## 2020-04-03 DIAGNOSIS — F419 Anxiety disorder, unspecified: Secondary | ICD-10-CM

## 2020-04-03 DIAGNOSIS — M25511 Pain in right shoulder: Secondary | ICD-10-CM | POA: Diagnosis not present

## 2020-04-03 DIAGNOSIS — F32A Depression, unspecified: Secondary | ICD-10-CM

## 2020-04-03 DIAGNOSIS — M48061 Spinal stenosis, lumbar region without neurogenic claudication: Secondary | ICD-10-CM | POA: Diagnosis not present

## 2020-04-03 DIAGNOSIS — Z5181 Encounter for therapeutic drug level monitoring: Secondary | ICD-10-CM

## 2020-04-03 DIAGNOSIS — M5442 Lumbago with sciatica, left side: Secondary | ICD-10-CM | POA: Diagnosis not present

## 2020-04-03 MED ORDER — LAMOTRIGINE 200 MG PO TABS: 200.0000 mg | ORAL_TABLET | Freq: Every day | ORAL | 0 refills | Status: DC

## 2020-04-03 MED ORDER — ARIPIPRAZOLE 5 MG PO TABS
5.0000 mg | ORAL_TABLET | Freq: Every day | ORAL | 3 refills | Status: DC
Start: 2020-04-03 — End: 2020-07-14

## 2020-04-03 NOTE — Telephone Encounter (Signed)
If we can get her in on Thursday would be great in person

## 2020-04-03 NOTE — Progress Notes (Signed)
Request refill on psychiatric medication current insurer no longer covers psychiatric evaluation.  WIll see about [facility]

## 2020-04-04 NOTE — Telephone Encounter (Signed)
Patient is schedule 04/06/20 with AMS

## 2020-04-06 ENCOUNTER — Encounter: Payer: Self-pay | Admitting: Obstetrics and Gynecology

## 2020-04-06 ENCOUNTER — Ambulatory Visit (INDEPENDENT_AMBULATORY_CARE_PROVIDER_SITE_OTHER): Payer: BC Managed Care – PPO | Admitting: Obstetrics and Gynecology

## 2020-04-06 VITALS — BP 122/83 | Ht 59.0 in | Wt 203.0 lb

## 2020-04-06 DIAGNOSIS — F32A Depression, unspecified: Secondary | ICD-10-CM

## 2020-04-06 DIAGNOSIS — Z5181 Encounter for therapeutic drug level monitoring: Secondary | ICD-10-CM | POA: Diagnosis not present

## 2020-04-06 DIAGNOSIS — F419 Anxiety disorder, unspecified: Secondary | ICD-10-CM

## 2020-04-06 NOTE — Progress Notes (Signed)
Obstetrics & Gynecology Office Visit   Chief Complaint:  Chief Complaint  Patient presents with  . Follow-up    F/U medications. RM 3    History of Present Illness: The patient is a 30 y.o. female presenting follow up for symptoms of anxiety and depression.  She has until recently been under the care of Dr. Nicolasa Ducking.  Has been switched to Lamictal 233m daily and Abilify 588mpo daily and reports good overall improvement in symptoms since starting on this regimen.  She feels like she has finally found a drug regimen that work.  Reports being able to engage with friends again without social anxiety or agoraphobia.  In fact reports planning on a dinner with some of her friends tonight.  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  However, she does report recent increased in anxiety but a lot of this hinged around concerns that she was going to be running out of medications without having found another provider to continue care under.    Review of Systems: Review of Systems  Constitutional: Negative.   Gastrointestinal: Negative for nausea.  Neurological: Negative for headaches.  Psychiatric/Behavioral: Negative for depression, hallucinations, memory loss, substance abuse and suicidal ideas. The patient is nervous/anxious and has insomnia.      Past Medical History:  Past Medical History:  Diagnosis Date  . Anxiety   . BRCA negative 04/2018   MyRisk neg; IBIS=8.9%/riskscore=6.7%  . Eclampsia   . Family history of ovarian cancer   . Gestational diabetes   . Migraine headache   . PCOS (polycystic ovarian syndrome)     Past Surgical History:  Past Surgical History:  Procedure Laterality Date  . CESAREAN SECTION N/A 07/15/2014   Procedure: CESAREAN SECTION;  Surgeon: RoGae DryMD;  Location: [facility] ORS;  Service: Obstetrics;  Laterality: N/A;  . CESAREAN SECTION N/A 02/10/2018   Procedure: CESAREAN SECTION;  Surgeon: JaWill BonnetMD;   Location: [facility] ORS;  Service: Obstetrics;  Laterality: N/A;  . CESAREAN SECTION N/A 12/31/2018   Procedure: CESAREAN SECTION;  Surgeon: StMalachy MoodMD;  Location: [facility] ORS;  Service: Obstetrics;  Laterality: N/A;  . HERNIA REPAIR  10/2013  . WISDOM TOOTH EXTRACTION      Gynecologic History: No LMP recorded. (Menstrual status: IUD).  Obstetric History: [id number] History:  Family History  Problem Relation Age of Onset  . Ovarian cancer Mother   . Arthritis Mother   . Diabetes Paternal Grandfather   . Diabetes Paternal Aunt   . Ovarian cancer Maternal Aunt   . Breast cancer Maternal Grandmother   . Uterine cancer Cousin     Social History:  Social History   Socioeconomic History  . Marital status: Married    Spouse name: CoEinar Pheasant. Number of children: Not on file  . Years of education: Not on file  . Highest education level: Not on file  Occupational History  . Not on file  Tobacco Use  . Smoking status: Never Smoker  . Smokeless tobacco: Never Used  Vaping Use  . Vaping Use: Never used  Substance and Sexual Activity  . Alcohol use: No  . Drug use: Never  . Sexual activity: Yes    Birth control/protection: I.U.D.    Comment: will discuss with MD  Other Topics Concern  . Not on file  Social History Narrative  . Not on file   Social Determinants of Health   Financial  Resource Strain: Not on file  Food Insecurity: Not on file  Transportation Needs: Not on file  Physical Activity: Not on file  Stress: Not on file  Social Connections: Not on file  Intimate Partner Violence: Not on file    Allergies:  Allergies  Allergen Reactions  . Fish Allergy Shortness Of Breath  . Shellfish Allergy Shortness Of Breath    Medications: Prior to Admission medications   Medication Sig Start Date End Date Taking? Authorizing Provider  ARIPiprazole (ABILIFY) 5 MG tablet Take 1 tablet (5 mg total) by mouth daily. 04/03/20  Yes [name], MD   Butalbital-APAP-Caffeine [phone] MG capsule Take by mouth. 10/11/19  Yes [provider]  citalopram (CELEXA) 20 MG tablet Take 20 mg by mouth daily. 10/04/19  Yes [provider]  lamoTRIgine (LAMICTAL) 200 MG tablet Take 1 tablet (200 mg total) by mouth daily. 04/03/20  Yes [name], MD  traZODone (DESYREL) 50 MG tablet  10/04/19  Yes [provider]  busPIRone (BUSPAR) 7.5 MG tablet TAKE 1 TABLET (7.5 MG TOTAL) BY MOUTH 2 (TWO) TIMES DAILY. Patient not taking: Reported on 04/06/2020 08/03/19   [name], MD  hydrOXYzine (ATARAX/VISTARIL) 25 MG tablet Take 1 tablet (25 mg total) by mouth every 6 (six) hours as needed for anxiety. Patient not taking: Reported on 04/06/2020 08/11/19   [name], MD  vortioxetine HBr (TRINTELLIX) 10 MG TABS tablet Take 1 tablet (10 mg total) by mouth daily. Patient not taking: Reported on 04/06/2020 09/22/19   [name], MD    Physical Exam Vitals:  Vitals:   04/06/20 1358  BP: 122/83   No LMP recorded. (Menstrual status: IUD).  General: NAD HEENT: normocephalic, anicteric Pulmonary: No increased work of breathing Neurologic: Grossly intact Psychiatric: mood appropriate, affect full, some increased psychomotor agitation noted  Assessment: 30 y.o. [id number] follow up anxiety depression  Plan: Problem List Items Addressed This Visit   None   Visit Diagnoses    Encounter for therapeutic drug monitoring    -  Primary   Relevant Orders   CBC With Differential   Comprehensive metabolic panel   Ambulatory referral to Psychiatry   Anxiety and depression       Relevant Orders   CBC With Differential   Comprehensive metabolic panel   Ambulatory referral to Psychiatry      1) Patient has been under the care of Dr. Nicolasa Ducking until recently informed her insurance was no longer covering visits with Dr. Nicolasa Ducking.   - continue lamictal 278m daily and Abilify 586mpo daily - discussed that I do not generally  manage these medication but I feel that it is important that she does not have a lapse in treatment so refilled until able to establish with new psychiatrist - will check CBC and CMP - will continue to see in follow up until established with psychiatry.  2) A total of 15 minutes were spent in face-to-face contact with the patient during this encounter with over half of that time devoted to counseling and coordination of care.  3) Return in about 4 weeks (around 05/04/2020) for medication follow up.   AnMalachy MoodMD, [facility], [facility] 04/06/2020, 3:12 PM

## 2020-04-07 LAB — CBC WITH DIFFERENTIAL
Basophils Absolute: 0 10*3/uL (ref 0.0–0.2)
Basos: 0 %
EOS (ABSOLUTE): 0.1 10*3/uL (ref 0.0–0.4)
Eos: 1 %
Hematocrit: 39.6 % (ref 34.0–46.6)
Hemoglobin: 13.2 g/dL (ref 11.1–15.9)
Immature Grans (Abs): 0 10*3/uL (ref 0.0–0.1)
Immature Granulocytes: 0 %
Lymphocytes Absolute: 2 10*3/uL (ref 0.7–3.1)
Lymphs: 20 %
MCH: 29.6 pg (ref 26.6–33.0)
MCHC: 33.3 g/dL (ref 31.5–35.7)
MCV: 89 fL (ref 79–97)
Monocytes Absolute: 0.7 10*3/uL (ref 0.1–0.9)
Monocytes: 8 %
Neutrophils Absolute: 6.9 10*3/uL (ref 1.4–7.0)
Neutrophils: 71 %
RBC: 4.46 x10E6/uL (ref 3.77–5.28)
RDW: 13.8 % (ref 11.7–15.4)
WBC: 9.8 10*3/uL (ref 3.4–10.8)

## 2020-04-07 LAB — COMPREHENSIVE METABOLIC PANEL
ALT: 23 IU/L (ref 0–32)
AST: 17 IU/L (ref 0–40)
Albumin/Globulin Ratio: 2.1 (ref 1.2–2.2)
Albumin: 4.8 g/dL (ref 3.8–4.8)
Alkaline Phosphatase: 96 IU/L (ref 44–121)
BUN/Creatinine Ratio: 17 (ref 9–23)
BUN: 18 mg/dL (ref 6–20)
Bilirubin Total: 0.3 mg/dL (ref 0.0–1.2)
CO2: 23 mmol/L (ref 20–29)
Calcium: 9.6 mg/dL (ref 8.7–10.2)
Chloride: 101 mmol/L (ref 96–106)
Creatinine, Ser: 1.04 mg/dL — ABNORMAL HIGH (ref 0.57–1.00)
GFR calc Af Amer: 83 mL/min/{1.73_m2} (ref >59)
GFR calc non Af Amer: 72 mL/min/{1.73_m2} (ref >59)
Globulin, Total: 2.3 g/dL (ref 1.5–4.5)
Glucose: 95 mg/dL (ref 65–99)
Potassium: 4.4 mmol/L (ref 3.5–5.2)
Sodium: 139 mmol/L (ref 134–144)
Total Protein: 7.1 g/dL (ref 6.0–8.5)

## 2020-04-17 ENCOUNTER — Other Ambulatory Visit: Payer: Self-pay

## 2020-04-17 ENCOUNTER — Ambulatory Visit
Admission: RE | Admit: 2020-04-17 | Discharge: 2020-04-17 | Disposition: A | Discharge status: Home / Self Care | Payer: BC Managed Care – PPO | Source: Ambulatory Visit

## 2020-04-17 VITALS — BP 109/73 | HR 105 | Temp 98.4°F | Resp 18 | Ht 59.0 in | Wt 195.0 lb

## 2020-04-17 DIAGNOSIS — J0100 Acute maxillary sinusitis, unspecified: Secondary | ICD-10-CM

## 2020-04-17 MED ORDER — BENZONATATE 100 MG PO CAPS: 200.0000 mg | ORAL_CAPSULE | Freq: Three times a day (TID) | ORAL | 0 refills | Status: DC

## 2020-04-17 MED ORDER — PROMETHAZINE-DM 6.25-15 MG/5ML PO SYRP: 5.0000 mL | ORAL_SOLUTION | Freq: Four times a day (QID) | ORAL | 0 refills | Status: DC | PRN

## 2020-04-17 MED ORDER — AMOXICILLIN-POT CLAVULANATE 875-125 MG PO TABS: 1.0000 | ORAL_TABLET | Freq: Two times a day (BID) | ORAL | 0 refills | Status: AC

## 2020-04-17 NOTE — ED Triage Notes (Signed)
Pt c/o nasal congestion, chest congestion, cough increasing over the past few days. Pt also reports her children are sick, they have been tested for COVID and Flu and are negative. Pt also reports sinus pain/pressure. Pt has been taking Dayquil with some improvement in symptoms.

## 2020-04-17 NOTE — Discharge Instructions (Addendum)
The Augmentin twice daily with food for 10 days for treatment of your sinusitis.  Perform sinus irrigation 2-3 times a day with a [employer] sinus rinse kit and distilled water.  Do not use tap water.  You can use plain over-the-counter Mucinex every 6 hours to break up the stickiness of the mucus so your body can clear it.  The Tessalon Perles during the day as needed for cough and the Promethazine DM at nighttime as will help you with cough and congestion as well as sleep.  Increase your oral fluid intake to thin out your mucus so that is also able for your body to clear more easily.  Take an over-the-counter probiotic, such as Culturelle-align-activia, 1 hour after each dose of antibiotic to prevent diarrhea.  If you develop any new or worsening symptoms return for reevaluation or see your primary care provider.

## 2020-04-17 NOTE — ED Provider Notes (Signed)
[facility]    CSN: [id number] Arrival date & time: 04/17/20  1500      History   Chief Complaint Chief Complaint  Patient presents with  . Cough  . Nasal Congestion    HPI Denise Walker is a 30 y.o. female.   HPI   30 year old female here for evaluation of upper respiratory symptoms.  Patient reports that she has been experiencing nasal congestion, chest congestion, productive cough, sinus pain, green nasal discharge, and a fever up to 103 that started 4 5 days ago.  Patient is also complaining of pain in her cheeks and in her upper teeth.  Patient denies sore throat or GI complaints.  Both of her children were just diagnosed with bilateral ear infections from an upper respiratory infection.  Past Medical History:  Diagnosis Date  . Anxiety   . BRCA negative 04/2018   MyRisk neg; IBIS=8.9%/riskscore=6.7%  . Eclampsia   . Family history of ovarian cancer   . Gestational diabetes   . Migraine headache   . PCOS (polycystic ovarian syndrome)     Patient Active Problem List   Diagnosis Date Noted  . Uterine scar from previous cesarean delivery affecting pregnancy 12/31/2018  . Gestational diabetes 11/03/2018  . 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 Short interval between pregnancies affecting pregnancy, antepartum 06/02/2018  . History of gestational diabetes 06/02/2018  . Maternal obesity, antepartum 06/02/2018  . Supervision of high risk pregnancy, antepartum 06/02/2018  . BRCA gene mutation negative 05/04/2018  . History of cesarean delivery 02/10/2018  . Rh negative state in antepartum period 08/23/2017  . History of eclampsia 07/22/2017  . Migraines 04/09/2017    Past Surgical History:  Procedure Laterality Date  . CESAREAN SECTION N/A 07/15/2014   Procedure: CESAREAN SECTION;  Surgeon: Gae Dry, MD;  Location: [facility];  Service: Obstetrics;  Laterality: N/A;  . CESAREAN SECTION N/A 02/10/2018   Procedure: CESAREAN  SECTION;  Surgeon: Will Bonnet, MD;  Location: [facility];  Service: Obstetrics;  Laterality: N/A;  . CESAREAN SECTION N/A 12/31/2018   Procedure: CESAREAN SECTION;  Surgeon: [name], MD;  Location: [facility];  Service: Obstetrics;  Laterality: N/A;  . HERNIA REPAIR  10/2013  . WISDOM TOOTH EXTRACTION      OB History    Gravida  6   Para  3   Term  3   Preterm      AB  3   Living  3     SAB  3   IAB      Ectopic      Multiple  0   Live Births  3            Home Medications    Prior to Admission medications   Medication Sig Start Date End Date Taking? Authorizing Provider  amoxicillin-clavulanate (AUGMENTIN) 875-125 MG tablet Take 1 tablet by mouth every 12 (twelve) hours for 10 days. 04/17/20 04/27/20 Yes Margarette Canada, NP  ARIPiprazole (ABILIFY) 5 MG tablet Take 1 tablet (5 mg total) by mouth daily. 04/03/20  Yes [name], MD  benzonatate (TESSALON) 100 MG capsule Take 2 capsules (200 mg total) by mouth every 8 (eight) hours. 04/17/20  Yes Margarette Canada, NP  cyclobenzaprine (FLEXERIL) 10 MG tablet Take 1 tablet by mouth as needed. 04/16/20  Yes [provider]  lamoTRIgine (LAMICTAL) 200 MG tablet Take 1 tablet (200 mg total) by mouth daily. 04/03/20  Yes [name], MD  levonorgestrel ([place]) 20  MCG/24HR IUD by Intrauterine route.   Yes [provider]  omeprazole (PRILOSEC) 40 MG capsule Take 40 mg by mouth daily. 03/09/20  Yes [provider]  promethazine-dextromethorphan (PROMETHAZINE-DM) 6.25-15 MG/5ML syrup Take 5 mLs by mouth 4 (four) times daily as needed. 04/17/20  Yes Margarette Canada, NP  traZODone (DESYREL) 50 MG tablet  10/04/19  Yes [provider]  Butalbital-APAP-Caffeine 50-325-40 MG capsule Take by mouth. 10/11/19   [provider]  citalopram (CELEXA) 20 MG tablet Take 20 mg by mouth daily. 10/04/19 04/17/20  [provider]    Family History Family History  Problem Relation Age  of Onset  . Ovarian cancer Mother   . Arthritis Mother   . Diabetes Paternal Grandfather   . Diabetes Paternal Aunt   . Ovarian cancer Maternal Aunt   . Breast cancer Maternal Grandmother   . Uterine cancer Cousin     Social History Social History   Tobacco Use  . Smoking status: Never Smoker  . Smokeless tobacco: Never Used  Vaping Use  . Vaping Use: Never used  Substance Use Topics  . Alcohol use: No  . Drug use: Never     Allergies   Fish allergy and Shellfish allergy   Review of Systems Review of Systems  Constitutional: Positive for fever. Negative for activity change and appetite change.  HENT: Positive for congestion, rhinorrhea, sinus pressure and sinus pain. Negative for ear pain and sore throat.   Respiratory: Positive for cough.   Gastrointestinal: Negative for diarrhea, nausea and vomiting.  Musculoskeletal: Negative for arthralgias and myalgias.  Hematological: Negative.   Psychiatric/Behavioral: Negative.      Physical Exam Triage Vital Signs ED Triage Vitals  Enc Vitals Group     BP 04/17/20 1533 109/73     Pulse Rate 04/17/20 1533 (!) 105     Resp 04/17/20 1533 18     Temp 04/17/20 1533 98.4 F (36.9 C)     Temp Source 04/17/20 1533 Oral     SpO2 04/17/20 1533 99 %     Weight 04/17/20 1530 195 lb (88.5 kg)     Height 04/17/20 1530 _0  (1.499 m)     Head Circumference --      Peak Flow --      Pain Score 04/17/20 1530 7     Pain Loc --      Pain Edu? --      Excl. in [place]? --    No data found.  Updated Vital Signs BP 109/73 (BP Location: Left Arm)   Pulse (!) 105   Temp 98.4 F (36.9 C) (Oral)   Resp 18   Ht _1  (1.499 m)   Wt 195 lb (88.5 kg)   SpO2 99%   BMI 39.39 kg/m   Visual Acuity Right Eye Distance:   Left Eye Distance:   Bilateral Distance:    Right Eye Near:   Left Eye Near:    Bilateral Near:     Physical Exam Vitals and nursing note reviewed.  Constitutional:      General: She is not in acute  distress.    Appearance: Normal appearance. She is ill-appearing.  HENT:     Head: Normocephalic and atraumatic.     Right Ear: Tympanic membrane, ear canal and external ear normal.     Left Ear: Tympanic membrane, ear canal and external ear normal.     Nose: Congestion and rhinorrhea present.     Mouth/Throat:  Mouth: Mucous membranes are moist.     Pharynx: Oropharynx is clear. No oropharyngeal exudate or posterior oropharyngeal erythema.  Cardiovascular:     Rate and Rhythm: Normal rate and regular rhythm.     Pulses: Normal pulses.     Heart sounds: Normal heart sounds. No murmur heard. No gallop.   Pulmonary:     Effort: Pulmonary effort is normal.     Breath sounds: No wheezing, rhonchi or rales.  Musculoskeletal:     Cervical back: Normal range of motion and neck supple.  Lymphadenopathy:     Cervical: No cervical adenopathy.  Skin:    General: Skin is warm and dry.     Capillary Refill: Capillary refill takes less than 2 seconds.     Findings: No erythema or rash.  Neurological:     General: No focal deficit present.     Mental Status: She is alert and oriented to person, place, and time.  Psychiatric:        Mood and Affect: Mood normal.        Behavior: Behavior normal.        Thought Content: Thought content normal.        Judgment: Judgment normal.      UC Treatments / Results  Labs (all labs ordered are listed, but only abnormal results are displayed) Labs Reviewed - No data to display  EKG   Radiology No results found.  Procedures Procedures (including critical care time)  Medications Ordered in UC Medications - No data to display  Initial Impression / Assessment and Plan / UC Course  I have reviewed the triage vital signs and the nursing notes.  Pertinent labs & imaging results that were available during my care of the patient were reviewed by me and considered in my medical decision making (see chart for details).   Patient is a very  pleasant 30 year old female here for evaluation of upper respiratory symptoms have been going on for the past 4 to 5 days.  Patient does appear as though she does not feel well.  Physical exam reveals bilateral pearly gray TMs with normal light reflex and clear external auditory canals.  Nasal mucosa is erythematous and edematous with thick purulent discharge in the left nare.  Patient's maxillary sinuses are tender to percussion.  Posterior oropharynx is pink and moist without erythema or injection.  No cervical lymphadenopathy on exam.  Lungs are clear to auscultation all fields.  Will discharge patient home with a diagnosis of maxillary sinusitis, treat with Augmentin 875 twice daily x10 days, give Tessalon Perles and Promethazine DM cough syrup for cough and congestion, advised sinus irrigation, and increase oral fluid intake to thin out mucus.   Final Clinical Impressions(s) / UC Diagnoses   Final diagnoses:  Acute non-recurrent maxillary sinusitis     Discharge Instructions     The Augmentin twice daily with food for 10 days for treatment of your sinusitis.  Perform sinus irrigation 2-3 times a day with a [employer]Med sinus rinse kit and distilled water.  Do not use tap water.  You can use plain over-the-counter Mucinex every 6 hours to break up the stickiness of the mucus so your body can clear it.  The Tessalon Perles during the day as needed for cough and the Promethazine DM at nighttime as will help you with cough and congestion as well as sleep.  Increase your oral fluid intake to thin out your mucus so that is also able for your body to  clear more easily.  Take an over-the-counter probiotic, such as Culturelle-align-activia, 1 hour after each dose of antibiotic to prevent diarrhea.  If you develop any new or worsening symptoms return for reevaluation or see your primary care provider.     ED Prescriptions    Medication Sig Dispense Auth. Provider   amoxicillin-clavulanate  (AUGMENTIN) 875-125 MG tablet Take 1 tablet by mouth every 12 (twelve) hours for 10 days. 20 tablet Margarette Canada, NP   benzonatate (TESSALON) 100 MG capsule Take 2 capsules (200 mg total) by mouth every 8 (eight) hours. 21 capsule Margarette Canada, NP   promethazine-dextromethorphan (PROMETHAZINE-DM) 6.25-15 MG/5ML syrup Take 5 mLs by mouth 4 (four) times daily as needed. 118 mL Margarette Canada, NP     PDMP not reviewed this encounter.   Margarette Canada, NP 04/17/20 1609

## 2020-05-04 ENCOUNTER — Encounter: Payer: Self-pay | Admitting: Obstetrics and Gynecology

## 2020-05-04 ENCOUNTER — Ambulatory Visit (INDEPENDENT_AMBULATORY_CARE_PROVIDER_SITE_OTHER): Payer: BC Managed Care – PPO | Admitting: Obstetrics and Gynecology

## 2020-05-04 ENCOUNTER — Other Ambulatory Visit: Payer: Self-pay

## 2020-05-04 VITALS — BP 117/81 | HR 99 | Ht 59.0 in | Wt 201.0 lb

## 2020-05-04 DIAGNOSIS — F419 Anxiety disorder, unspecified: Secondary | ICD-10-CM | POA: Diagnosis not present

## 2020-05-04 DIAGNOSIS — F32A Depression, unspecified: Secondary | ICD-10-CM

## 2020-05-04 NOTE — Progress Notes (Signed)
Obstetrics & Gynecology Office Visit   Chief Complaint:  Chief Complaint  Patient presents with  . Follow-up    F/U medication - no concerns. RM 2    History of Present Illness: The patient is a 30 y.o. female presenting follow up for symptoms of anxiety and depression.  The patient is currently taking Abilify and Lamictal for the management of her symptoms.  She has not had recent situational stressors, still some health issues with youngest daughter post COVID.  She reports symptoms of depression remain well controlled.  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    She reports significant anxiety with is bothersome to her although that also remains improved overall.  Has not heard back from psychiatry on follow up yet. Symptoms have remained unchanged since last visit.     The patient does have a pre-existing history of depression and anxiety.  She  does not a prior history of suicide attempts.   No side-effects noted, no extrapyramidal symptoms endorsed.    Review of Systems: Review of Systems  Constitutional: Negative.   Gastrointestinal: Negative.   Genitourinary: Negative.   Psychiatric/Behavioral: Positive for depression. Negative for hallucinations, memory loss, substance abuse and suicidal ideas. The patient is nervous/anxious. The patient does not have insomnia.      Past Medical History:  Past Medical History:  Diagnosis Date  . Anxiety   . BRCA negative 04/2018   MyRisk neg; IBIS=8.9%/riskscore=6.7%  . Eclampsia   . Family history of ovarian cancer   . Gestational diabetes   . Migraine headache   . PCOS (polycystic ovarian syndrome)     Past Surgical History:  Past Surgical History:  Procedure Laterality Date  . CESAREAN SECTION N/A 07/15/2014   Procedure: CESAREAN SECTION;  Surgeon: Gae Dry, MD;  Location: [facility];  Service: Obstetrics;  Laterality: N/A;  . CESAREAN SECTION N/A 02/10/2018   Procedure: CESAREAN  SECTION;  Surgeon: Will Bonnet, MD;  Location: [facility];  Service: Obstetrics;  Laterality: N/A;  . CESAREAN SECTION N/A 12/31/2018   Procedure: CESAREAN SECTION;  Surgeon: [name], MD;  Location: [facility];  Service: Obstetrics;  Laterality: N/A;  . HERNIA REPAIR  10/2013  . WISDOM TOOTH EXTRACTION      Gynecologic History: No LMP recorded. (Menstrual status: IUD).  Obstetric History: [id number]  Family History:  Family History  Problem Relation Age of Onset  . Ovarian cancer Mother   . Arthritis Mother   . Diabetes Paternal Grandfather   . Diabetes Paternal Aunt   . Ovarian cancer Maternal Aunt   . Breast cancer Maternal Grandmother   . Uterine cancer Cousin     Social History:  Social History   Socioeconomic History  . Marital status: Married    Spouse name: Einar Pheasant  . Number of children: Not on file  . Years of education: Not on file  . Highest education level: Not on file  Occupational History  . Not on file  Tobacco Use  . Smoking status: Never Smoker  . Smokeless tobacco: Never Used  Vaping Use  . Vaping Use: Never used  Substance and Sexual Activity  . Alcohol use: No  . Drug use: Never  . Sexual activity: Yes    Birth control/protection: I.U.D.    Comment: will discuss with MD  Other Topics Concern  . Not on file  Social History Narrative  . Not on file   Social Determinants of Health  Financial Resource Strain: Not on file  Food Insecurity: Not on file  Transportation Needs: Not on file  Physical Activity: Not on file  Stress: Not on file  Social Connections: Not on file  Intimate Partner Violence: Not on file    Allergies:  Allergies  Allergen Reactions  . Fish Allergy Shortness Of Breath  . Shellfish Allergy Shortness Of Breath    Medications: Prior to Admission medications   Medication Sig Start Date End Date Taking? Authorizing Provider  ARIPiprazole (ABILIFY) 5 MG tablet Take 1 tablet (5 mg total) by mouth daily. 04/03/20   Yes [name], MD  cyclobenzaprine (FLEXERIL) 10 MG tablet Take 1 tablet by mouth as needed. 04/16/20  Yes [provider]  lamoTRIgine (LAMICTAL) 200 MG tablet Take 1 tablet (200 mg total) by mouth daily. 04/03/20  Yes [name], MD  traZODone (DESYREL) 50 MG tablet  10/04/19  Yes [provider]  benzonatate (TESSALON) 100 MG capsule Take 2 capsules (200 mg total) by mouth every 8 (eight) hours. Patient not taking: Reported on 05/04/2020 04/17/20   Margarette Canada, NP  Butalbital-APAP-Caffeine [phone] MG capsule Take by mouth. 10/11/19   [provider]  levonorgestrel (MIRENA) 20 MCG/24HR IUD by Intrauterine route.    [provider]  omeprazole (PRILOSEC) 40 MG capsule Take 40 mg by mouth daily. Patient not taking: Reported on 05/04/2020 03/09/20   [provider]  promethazine-dextromethorphan (PROMETHAZINE-DM) 6.25-15 MG/5ML syrup Take 5 mLs by mouth 4 (four) times daily as needed. Patient not taking: Reported on 05/04/2020 04/17/20   Margarette Canada, NP  citalopram (CELEXA) 20 MG tablet Take 20 mg by mouth daily. 10/04/19 04/17/20  [provider]    Physical Exam Vitals:  Vitals:   05/04/20 1122  BP: 117/81  Pulse: 99   No LMP recorded. (Menstrual status: IUD).  General: NAD HEENT: normocephalic, anicteric Pulmonary: No increased work of breathing Neurologic: Grossly intact Psychiatric: mood appropriate, affect full    GAD 7 : Generalized Anxiety Score 05/04/2020 11/18/2019 08/25/2019 07/29/2019  Nervous, Anxious, on Edge _0 Control/stop worrying _1 Worry too much - different things _2 Trouble relaxing _3 Restless _4 Easily annoyed or irritable _5 Afraid - awful might happen _6 Total GAD 7 Score _7 Anxiety Difficulty Very difficult Very difficult - Very difficult    Depression screen [facility] 2/9 05/04/2020 11/18/2019 08/25/2019  Decreased Interest _8 Down, Depressed,  Hopeless _9 PHQ - 2 Score _10 Altered sleeping _11 Tired, decreased energy _12 Change in appetite _13 Feeling bad or failure about yourself  _14 Trouble concentrating _15 Moving slowly or fidgety/restless 0 0 3  Suicidal thoughts 0 0 0  PHQ-9 Score _16 Difficult doing work/chores Somewhat difficult Very difficult -  Some recent data might be hidden    Depression screen [facility] 2/9 05/04/2020 11/18/2019 08/25/2019 07/29/2019 07/05/2019  Decreased Interest _17 Down, Depressed, Hopeless _18 PHQ - 2 Score _19 Altered sleeping _20 Tired, decreased energy _21 Change in appetite _22 Feeling bad or  failure about yourself  _0 Trouble concentrating _1 Moving slowly or fidgety/restless 0 0 _2 Suicidal thoughts 0 0 0 0 0  PHQ-9 Score _3 Difficult doing work/chores Somewhat difficult Very difficult - Very difficult Very difficult  Some recent data might be hidden     Assessment: 30 y.o. [id number] follow anxiety/depression  Plan: Problem List Items Addressed This Visit   None   Visit Diagnoses    Anxiety and depression    -  Primary      1) Continued Abilify and Lamictal until able to establish with new psychiatrist  2) Thyroid and B12 screen has been obtained previously  3) A total of 15 minutes were spent in face-to-face contact with the patient during this encounter with over half of that time devoted to counseling and coordination of care.  4) Return in about 4 weeks (around 06/01/2020) for medication follow up.    [name], MD, [facility], [facility] 05/04/2020, 11:45 AM

## 2020-05-05 DIAGNOSIS — G8929 Other chronic pain: Secondary | ICD-10-CM | POA: Diagnosis not present

## 2020-05-05 DIAGNOSIS — M48061 Spinal stenosis, lumbar region without neurogenic claudication: Secondary | ICD-10-CM | POA: Diagnosis not present

## 2020-05-05 DIAGNOSIS — M5442 Lumbago with sciatica, left side: Secondary | ICD-10-CM | POA: Diagnosis not present

## 2020-05-22 DIAGNOSIS — M5442 Lumbago with sciatica, left side: Secondary | ICD-10-CM | POA: Diagnosis not present

## 2020-05-22 DIAGNOSIS — M25511 Pain in right shoulder: Secondary | ICD-10-CM | POA: Diagnosis not present

## 2020-05-22 DIAGNOSIS — M48061 Spinal stenosis, lumbar region without neurogenic claudication: Secondary | ICD-10-CM | POA: Diagnosis not present

## 2020-05-22 DIAGNOSIS — G8929 Other chronic pain: Secondary | ICD-10-CM | POA: Diagnosis not present

## 2020-06-08 ENCOUNTER — Encounter: Payer: Self-pay | Admitting: Obstetrics and Gynecology

## 2020-06-08 ENCOUNTER — Ambulatory Visit (INDEPENDENT_AMBULATORY_CARE_PROVIDER_SITE_OTHER): Payer: 59 | Admitting: Obstetrics and Gynecology

## 2020-06-08 ENCOUNTER — Other Ambulatory Visit: Payer: Self-pay

## 2020-06-08 VITALS — BP 128/86 | Ht 59.0 in | Wt 201.0 lb

## 2020-06-08 DIAGNOSIS — Z13220 Encounter for screening for lipoid disorders: Secondary | ICD-10-CM

## 2020-06-08 DIAGNOSIS — Z131 Encounter for screening for diabetes mellitus: Secondary | ICD-10-CM | POA: Diagnosis not present

## 2020-06-08 DIAGNOSIS — Z5181 Encounter for therapeutic drug level monitoring: Secondary | ICD-10-CM | POA: Diagnosis not present

## 2020-06-08 NOTE — Progress Notes (Signed)
-    Obstetrics & Gynecology Office Visit   Chief Complaint: No chief complaint on file.   History of Present Illness: The patient is a 30 y.o. female presenting follow up for symptoms of anxiety and depression.  The patient is currently taking lamictal 295m po daily and Abilify 546mpo daily for the management of her symptoms.  She has not had any new recent situational stressors.  She reports symptoms of anhedonia, irritability, social anxiety, feelings of guilt and feelings of worthlessness.  She denies risk taking behavior, agorophobia, suicidal ideation, homicidal ideation, auditory hallucinations and visual hallucinations. Symptoms have remained unchanged since last visit.   Overall she still feels she is doing better on her current regimens then on past regimens.  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     The patient does have a pre-existing history of depression and anxiety.  She  does not a prior history of suicide attempts.   Review of Systems: Review of Systems  Constitutional: Negative.   Gastrointestinal: Negative for nausea.  Neurological: Negative for headaches.  Endo/Heme/Allergies: Does not bruise/bleed easily.  Psychiatric/Behavioral: Positive for depression. Negative for hallucinations, memory loss, substance abuse and suicidal ideas. The patient is nervous/anxious. The patient does not have insomnia.      Past Medical History:  Past Medical History:  Diagnosis Date  . Anxiety   . BRCA negative 04/2018   MyRisk neg; IBIS=8.9%/riskscore=6.7%  . Eclampsia   . Family history of ovarian cancer   . Gestational diabetes   . Migraine headache   . PCOS (polycystic ovarian syndrome)     Past Surgical History:  Past Surgical History:  Procedure Laterality Date  . CESAREAN SECTION N/A 07/15/2014   Procedure: CESAREAN SECTION;  Surgeon: RoGae DryMD;  Location: [facility];  Service: Obstetrics;  Laterality: N/A;  .  CESAREAN SECTION N/A 02/10/2018   Procedure: CESAREAN SECTION;  Surgeon: JaWill BonnetMD;  Location: [facility];  Service: Obstetrics;  Laterality: N/A;  . CESAREAN SECTION N/A 12/31/2018   Procedure: CESAREAN SECTION;  Surgeon: StMalachy MoodMD;  Location: [facility];  Service: Obstetrics;  Laterality: N/A;  . HERNIA REPAIR  10/2013  . WISDOM TOOTH EXTRACTION      Gynecologic History: No LMP recorded. (Menstrual status: IUD).  Obstetric History: [id number] History:  Family History  Problem Relation Age of Onset  . Ovarian cancer Mother   . Arthritis Mother   . Diabetes Paternal Grandfather   . Diabetes Paternal Aunt   . Ovarian cancer Maternal Aunt   . Breast cancer Maternal Grandmother   . Uterine cancer Cousin     Social History:  Social History   Socioeconomic History  . Marital status: Married    Spouse name: CoEinar Pheasant. Number of children: Not on file  . Years of education: Not on file  . Highest education level: Not on file  Occupational History  . Not on file  Tobacco Use  . Smoking status: Never Smoker  . Smokeless tobacco: Never Used  Vaping Use  . Vaping Use: Never used  Substance and Sexual Activity  . Alcohol use: No  . Drug use: Never  . Sexual activity: Yes    Birth control/protection: I.U.D.    Comment: will discuss with MD  Other Topics Concern  . Not on file  Social History Narrative  . Not on file   Social Determinants of Health   Financial Resource Strain:  Not on file  Food Insecurity: Not on file  Transportation Needs: Not on file  Physical Activity: Not on file  Stress: Not on file  Social Connections: Not on file  Intimate Partner Violence: Not on file    Allergies:  Allergies  Allergen Reactions  . Fish Allergy Shortness Of Breath  . Shellfish Allergy Shortness Of Breath    Medications: Prior to Admission medications   Medication Sig Start Date End Date Taking? Authorizing Provider  ARIPiprazole (ABILIFY) 5 MG  tablet Take 1 tablet (5 mg total) by mouth daily. 04/03/20   [name], MD  Butalbital-APAP-Caffeine [phone] MG capsule Take by mouth. 10/11/19   [provider]  cyclobenzaprine (FLEXERIL) 10 MG tablet Take 1 tablet by mouth as needed. 04/16/20   [provider]  lamoTRIgine (LAMICTAL) 200 MG tablet Take 1 tablet (200 mg total) by mouth daily. 04/03/20   [name], MD  levonorgestrel (MIRENA) 20 MCG/24HR IUD by Intrauterine route.    [provider]  traZODone (DESYREL) 50 MG tablet  10/04/19   [provider]  citalopram (CELEXA) 20 MG tablet Take 20 mg by mouth daily. 10/04/19 04/17/20  [provider]    Physical Exam Vitals: There were no vitals filed for this visit. No LMP recorded. (Menstrual status: IUD).  General: NAD HEENT: normocephalic, anicteric Pulmonary: No increased work of breathing Neurologic: Grossly intact Psychiatric: mood appropriate, affect full    GAD 7 : Generalized Anxiety Score 06/08/2020 05/04/2020 11/18/2019 08/25/2019  Nervous, Anxious, on Edge _0 Control/stop worrying _1 Worry too much - different things _2 Trouble relaxing _3 Restless _4 Easily annoyed or irritable _5 Afraid - awful might happen _6 Total GAD 7 Score _7 Anxiety Difficulty Somewhat difficult Very difficult Very difficult -    Depression screen [facility] 2/9 06/08/2020 05/04/2020 11/18/2019  Decreased Interest _8 Down, Depressed, Hopeless _9 PHQ - 2 Score _10 Altered sleeping _11 Tired, decreased energy _12 Change in appetite _13 Feeling bad or failure about yourself  _14 Trouble concentrating _15 Moving slowly or fidgety/restless 0 0 0  Suicidal thoughts 0 0 0  PHQ-9 Score _16 Difficult doing work/chores Very difficult Somewhat difficult Very difficult  Some recent data might be hidden    Depression screen [facility] 2/9 06/08/2020 05/04/2020 11/18/2019  08/25/2019 07/29/2019  Decreased Interest _17 Down, Depressed, Hopeless _18 PHQ - 2 Score _19 Altered sleeping _20 Tired, decreased energy _21 Change in appetite _22 Feeling bad or failure about yourself  _23 Trouble concentrating _24 Moving slowly or fidgety/restless 0 0 0 3 2  Suicidal thoughts 0 0 0 0 0  PHQ-9 Score _25 Difficult doing work/chores Very difficult Somewhat difficult Very difficult - Very difficult  Some recent data might be hidden     Assessment: 30 y.o. [id number] follow up anxiety depression  Plan: Problem List Items Addressed This Visit   None   Visit Diagnoses  Therapeutic drug monitoring    -  Primary   Relevant Orders   Comprehensive metabolic panel   CBC   Lipid panel   Hemoglobin A1c   Lipid screening       Relevant Orders   Lipid panel   Screening for diabetes mellitus       Relevant Orders   Hemoglobin A1c      1) Anxiety/depression - awaiting transition of care.  Was seen by Dr. Nicolasa Ducking previously.  Discussed importance of following up with psychiatry as I do not manage the current medication she is taking. - CBC, CMP, lipid panel, HgbA1C today  2) Thyroid and B12 screen has been obtained previously  3) Return in about 8 weeks (around 08/03/2020) for annual exam.  [name], MD, [facility], [place] 06/08/2020, 11:21 AM

## 2020-06-09 LAB — LIPID PANEL
Chol/HDL Ratio: 3.7 ratio (ref 0.0–4.4)
Cholesterol, Total: 175 mg/dL (ref 100–199)
HDL: 47 mg/dL (ref >39)
LDL Chol Calc (NIH): 96 mg/dL (ref 0–99)
Triglycerides: 189 mg/dL — ABNORMAL HIGH (ref 0–149)
VLDL Cholesterol Cal: 32 mg/dL (ref 5–40)

## 2020-06-09 LAB — HEMOGLOBIN A1C
Est. average glucose Bld gHb Est-mCnc: 111 mg/dL
Hgb A1c MFr Bld: 5.5 % (ref 4.8–5.6)

## 2020-06-09 LAB — COMPREHENSIVE METABOLIC PANEL
ALT: 20 IU/L (ref 0–32)
AST: 15 IU/L (ref 0–40)
Albumin/Globulin Ratio: 1.8 (ref 1.2–2.2)
Albumin: 4.6 g/dL (ref 3.8–4.8)
Alkaline Phosphatase: 84 IU/L (ref 44–121)
BUN/Creatinine Ratio: 10 (ref 9–23)
BUN: 11 mg/dL (ref 6–20)
Bilirubin Total: 0.3 mg/dL (ref 0.0–1.2)
CO2: 24 mmol/L (ref 20–29)
Calcium: 9.4 mg/dL (ref 8.7–10.2)
Chloride: 104 mmol/L (ref 96–106)
Creatinine, Ser: 1.09 mg/dL — ABNORMAL HIGH (ref 0.57–1.00)
Globulin, Total: 2.5 g/dL (ref 1.5–4.5)
Glucose: 86 mg/dL (ref 65–99)
Potassium: 4.6 mmol/L (ref 3.5–5.2)
Sodium: 145 mmol/L — ABNORMAL HIGH (ref 134–144)
Total Protein: 7.1 g/dL (ref 6.0–8.5)
eGFR: 70 mL/min/{1.73_m2} (ref >59)

## 2020-06-09 LAB — CBC
Hematocrit: 41.2 % (ref 34.0–46.6)
Hemoglobin: 13.5 g/dL (ref 11.1–15.9)
MCH: 29.7 pg (ref 26.6–33.0)
MCHC: 32.8 g/dL (ref 31.5–35.7)
MCV: 91 fL (ref 79–97)
Platelets: 274 10*3/uL (ref 150–450)
RBC: 4.54 x10E6/uL (ref 3.77–5.28)
RDW: 13.7 % (ref 11.7–15.4)
WBC: 9.2 10*3/uL (ref 3.4–10.8)

## 2020-06-12 NOTE — Patient Instructions (Addendum)
Access Code: [email or web address] URL: [email or web address] Date: 06/13/2020 Prepared by: Ria Comment  Exercises Seated Gentle Upper Trapezius Stretch - 2 x daily - 7 x weekly - 30s x 3 bilateral hold Seated Cervical Sidebending Stretch - 2 x daily - 7 x weekly - 3 x 30s bilateral hold Correct Seated Posture - 7 x weekly

## 2020-06-13 ENCOUNTER — Other Ambulatory Visit: Payer: Self-pay

## 2020-06-13 ENCOUNTER — Other Ambulatory Visit: Payer: Self-pay | Admitting: Obstetrics and Gynecology

## 2020-06-13 ENCOUNTER — Ambulatory Visit: Payer: 59 | Attending: Physical Medicine & Rehabilitation

## 2020-06-13 DIAGNOSIS — G8929 Other chronic pain: Secondary | ICD-10-CM | POA: Diagnosis not present

## 2020-06-13 DIAGNOSIS — M6281 Muscle weakness (generalized): Secondary | ICD-10-CM | POA: Insufficient documentation

## 2020-06-13 DIAGNOSIS — M25511 Pain in right shoulder: Secondary | ICD-10-CM | POA: Diagnosis not present

## 2020-06-13 DIAGNOSIS — M25512 Pain in left shoulder: Secondary | ICD-10-CM | POA: Insufficient documentation

## 2020-06-13 MED ORDER — LAMOTRIGINE 200 MG PO TABS: 200.0000 mg | ORAL_TABLET | Freq: Every day | ORAL | 0 refills | Status: DC

## 2020-06-13 NOTE — Therapy (Signed)
[facility] [address] [place], [place], [address] Phone: [phone]   Fax:  [phone]  Physical Therapy Evaluation  Patient Details  Name: Britney Newstrom MRN: [id number] Date of Birth: July 31, 1990 Referring Provider (PT): Dr. Alba Destine   Encounter Date: 06/13/2020   PT End of Session - 06/14/20 1305    Visit Number 1    Number of Visits 17    Date for PT Re-Evaluation 08/08/20    Authorization Type eval: 06/13/20    PT Start Time 0930    PT Stop Time 1015    PT Time Calculation (min) 45 min    Activity Tolerance Patient tolerated treatment well    Behavior During Therapy [facility] for tasks assessed/performed           Past Medical History:  Diagnosis Date  . Anxiety   . BRCA negative 04/2018   MyRisk neg; IBIS=8.9%/riskscore=6.7%  . Eclampsia   . Family history of ovarian cancer   . Gestational diabetes   . Migraine headache   . PCOS (polycystic ovarian syndrome)     Past Surgical History:  Procedure Laterality Date  . CESAREAN SECTION N/A 07/15/2014   Procedure: CESAREAN SECTION;  Surgeon: Gae Dry, MD;  Location: [facility] ORS;  Service: Obstetrics;  Laterality: N/A;  . CESAREAN SECTION N/A 02/10/2018   Procedure: CESAREAN SECTION;  Surgeon: Will Bonnet, MD;  Location: [facility] ORS;  Service: Obstetrics;  Laterality: N/A;  . CESAREAN SECTION N/A 12/31/2018   Procedure: CESAREAN SECTION;  Surgeon: [name], MD;  Location: [facility] ORS;  Service: Obstetrics;  Laterality: N/A;  . HERNIA REPAIR  10/2013  . WISDOM TOOTH EXTRACTION      There were no vitals filed for this visit.    Subjective Assessment - 06/14/20 1237    Subjective Bilateral shoulder pain, L worse than R    Pertinent History Pt reports that she started with bilateral shoulder pain in 2020 after her 32 year old was born. She has 68 kids (71 years old and younger with the two younger kids having been born 59 months apart). She attributes the shoulder  pain to lifting and carrying multiple kids/baby carriers. Pt is L hand dominant. She has received steroid injections in both shoulders and had significant improvement in her R shoulder however her L shoulder has remained painful. She describes her shoulder pain as achy and sore. It is worse with lifting, touch, and reaching overhead with her LUE. Improves with rest, heat, and removing her bra. She also has chronic back pain but has had TFESI injections which have helped significantly. She reports numbness and weakness in her LLE but no LUE numbness or pain.    Limitations Lifting;House hold activities    Diagnostic tests Unable to find record of shoulder imaging    Patient Stated Goals Decrease L shoulder pain when lifting daughters out of crib and when putting them into her car    Currently in Pain? Yes    Pain Score 7    Best: 1/10, Worst: 8/10   Pain Location Shoulder    Pain Orientation Left    Pain Descriptors / Indicators Aching;Sore    Pain Type Chronic pain    Pain Radiating Towards None    Pain Onset More than a month ago    Pain Frequency Constant    Aggravating Factors  Lifting with LUE, reaching overhead, palpation of L shoulder, shoulder roll    Pain Relieving Factors heat, rest, injection helped  temporarily, removing bra helps             [facility] PT Assessment - 06/14/20 1247      Assessment   Medical Diagnosis Bilateral shoulder pain    Referring Provider (PT) Dr. Alba Destine    Onset Date/Surgical Date 01/12/19   Approximate   Hand Dominance Left    Next MD Visit Not reported    Prior Therapy None for shoulder pain      Precautions   Precautions None      Restrictions   Weight Bearing Restrictions No      Balance Screen   Has the patient fallen in the past 6 months No    Has the patient had a decrease in activity level because of a fear of falling?  No    Is the patient reluctant to leave their home because of a fear of falling?  No      Home Environment   Living  Environment Private residence    Living Arrangements Spouse/significant other    Available Help at Discharge Available 24 hours/day      Prior Function   Level of Independence Independent      Cognition   Overall Cognitive Status Within Functional Limits for tasks assessed             SUBJECTIVE  Onset: Pt reports that she started with bilateral shoulder pain in 2020 after her 32 year old was born. She has 33 kids (56 years old and younger with the two younger kids having been born 8 months apart). 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 Pt is L hand dominant. She has received steroid injections in both shoulders and had significant improvement in her R shoulder however her L shoulder has remained painful. She describes her shoulder pain as achy and sore. It is worse with lifting, touch, and reaching overhead with her LUE. Improves with rest, heat, and removing her bra. She also has chronic back pain but has had TFESI injections which have helped significantly. She reports numbness and weakness in her LLE but no LUE numbness or pain.   Shoulder trauma: No  Pain quality: achy, sore Pain: 7/10 Present, 1/10 Best, 8/10 Worst: Aggravating factors: Lifting with LUE, reaching overhead, palpation of L shoulder, shoulder roll Easing factors: heat, rest, injection helped temporarily, removing bra helps  24 hour pain behavior: worst in the evening, doesn't wake her up at night Radiating pain: No Numbness/Tingling: No Prior history of shoulder injury or pain: No Prior history of therapy for shoulder: No Follow-up appointment with MD: No Dominant hand: Left  OBJECTIVE  MUSCULOSKELETAL: Tremor: Normal Bulk: Normal Tone: Normal  Cervical Screen AROM: WFL and painless with overpressure in all planes Spurlings A (ipsilateral lateral flexion/axial compression): R: Negative L: Negative Spurlings B (ipsilateral lateral flexion/contralateral  rotation/axial compression): R: Negative L: Negative Hoffman Sign (cervical cord compression): R: Not examined L: Not examined ULTT Median: R: Not examined L: Not examined ULTT Ulnar: R: Not examined L: Not examined ULTT Radial: R: Not examined L: Not examined  Elbow Screen Elbow AROM: Within Normal Limits  Palpation Pain with palpation to anterior, posterior, lateral, and superior shoulder (less anterior), pain along upper trap and cervical paraspinals  Strength R/L 5/4* Shoulder flexion (anterior deltoid/pec major/coracobrachialis, axillary n. (C5-6) and musculocutaneous n. (C5-7)) 5/4* Shoulder abduction (deltoid/supraspinatus, axillary/suprascapular n, C5) 5/4* Shoulder external rotation (infraspinatus/teres minor) 5/4* Shoulder internal rotation (subcapularis/lats/pec major) 5/5 Elbow flexion (biceps brachii, brachialis, brachioradialis,  musculoskeletal n, C5-6) 5/5 Elbow extension (triceps, radial n, C7)  AROM R/L 170/158 Shoulder flexion 168/150 Shoulder abduction 90/72 Shoulder external rotation 68/62 Shoulder internal rotation *Indicates pain, overpressure performed unless otherwise indicated  Painful arc starts at 125 and continues throughout; [facility]: R: *C7, L: *occiput HBB: R: *T12, L: *T12   Accessory Motions/Glides Glenohumeral: Posterior: R: not examined L: abnormal, painful Inferior: R: not examined L: abnormal, painful Anterior: R: not examined L: not examined   NEUROLOGICAL:  Mental Status Patient is oriented to person, place and time.  Recent memory is intact.  Remote memory is intact.  Attention span and concentration are intact.  Expressive speech is intact.  Patient's fund of knowledge is within normal limits for educational level.  Cranial Nerves Deferred  Sensation Deferred  Reflexes Deferred  Coordination/Cerebellar Deferred   SPECIAL TESTS  Rotator Cuff  Drop Arm Test: Negative Painful Arc (Pain from 60 to 120 degrees  scaption): Negative, pain starts at 125 degrees and persists to end range Infraspinatus Muscle Test: Positive   Subacromial Impingement Hawkins-Kennedy: Not examined Neer (Block scapula, PROM flexion): Not examined Painful Arc (Pain from 60 to 120 degrees scaption): Negative, pain starts at 125 degrees and persists to end range Empty Can: Not examined External Rotation Resistance: Not examined Horizontal Adduction: Not examined Scapular Assist: Not examined  Labral Tear Biceps Load II (120 elevation, full ER, 90 elbow flexion, full supination, resisted elbow flexion): Not examined Crank (160 scaption, axial load with IR/ER): Not examined Active Compression Test: Not examined  Bicep Tendon Pathology Speed (shoulder flexion to 90, external rotation, full elbow extension, and forearm supination with resistance: Not examined Yergason's (resisted shoulder ER and supination/biceps tendon pathology): Not examined  Shoulder Instability Sulcus Sign: Not examined Anterior Apprehension: Not examined               Objective measurements completed on examination: See above findings.               PT Education - 06/14/20 1304    Education Details Plan of care and HEP    Person(s) Educated Patient    Methods Explanation    Comprehension Verbalized understanding            PT Short Term Goals - 06/14/20 1606      PT SHORT TERM GOAL #1   Title Pt will be independent with HEP in order to improve shoulder strength and decrease pain in order to improve pain-free function at home and work.    Time 4    Period Weeks    Status New    Target Date 07/12/20             PT Long Term Goals - 06/14/20 1607      PT LONG TERM GOAL #1   Title Pt will decrease quick DASH score by at least 8% in order to demonstrate clinically significant reduction in disability.    Baseline 06/13/20: Pt to complete at next visit    Time 8    Period Weeks    Status New    Target Date  08/08/20      PT LONG TERM GOAL #2   Title Pt will decrease worst pain as reported on NPRS by at least 3 points in order to demonstrate clinically significant reduction in pain.    Baseline 06/13/20: worst: 8/10;    Time 8    Period Weeks    Status New    Target Date 08/08/20  PT LONG TERM GOAL #3   Title Pt will be able to lift her daughters into her car and pick them up out of the crib while reporting at worst 2/10 L shoulder pain    Baseline 06/13/20: 8/10 with lifting    Time 8    Period Weeks    Status New    Target Date 08/08/20      PT LONG TERM GOAL #4   Title Pt will report at least 75% improvement in her L shoulder symptoms in order to resume full responsibilities at home with less pain    Time 8    Period Weeks    Status New    Target Date 08/08/20                  Plan - 06/13/20 1012    Clinical Impression Statement Pt is a pleasant 30 year-old female referred for bilateral shoulder pain however pt reports that it's predominantly the L shoulder currently which is troublesome. She is grossly tender painful to palpation to anterior, posterior, lateral, and superior shoulder (less anterior). Also painful to palpation along L upper trap and L cervical paraspinals. Painful strength testing of L shoulder in all directions with painful AROM over 125 degrees of L shoulder flexion and abduction. Limited L shoulder AROM in flexion, abduction, ER, and IR compared to R side. Painful passive accessory mobility testing of L shoulder into posterior and inferior directions. Pt will benefit from PT services to address deficits in strength, mobility, and pain in order to return to full function at home with less shoulder pain.    Personal Factors and Comorbidities Past/Current Experience;Time since onset of injury/illness/exacerbation;Comorbidity 2    Comorbidities Anxiety, migraines    Examination-Activity Limitations Carry;Reach Overhead;Lift;Bathing;Dressing;Caring for Others     Examination-Participation Restrictions Community Activity;Interpersonal Relationship;Meal Prep;Occupation;Shop    Stability/Clinical Decision Making Evolving/Moderate complexity    Clinical Decision Making Moderate    Rehab Potential Good    PT Frequency 2x / week    PT Duration 8 weeks    PT Treatment/Interventions ADLs/Self Care Home Management;Aquatic Therapy;Biofeedback;Canalith Repostioning;Cryotherapy;Electrical Stimulation;Iontophoresis 25m/ml Dexamethasone;Moist Heat;Traction;Ultrasound;DME Instruction;Gait training;Stair training;Functional mobility training;Therapeutic activities;Therapeutic exercise;Balance training;Neuromuscular re-education;Cognitive remediation;Patient/family education;Manual techniques;Passive range of motion;Dry needling;Vestibular;Spinal Manipulations;Joint Manipulations    PT Next Visit Plan thoracic and cervical mobs, posture education, L upper quarter STM, consider TDN, initiate light strengthening/stretching    PT Home Exercise Plan Access Code: [email or web address]   Consulted and Agree with Plan of Care Patient           Patient will benefit from skilled therapeutic intervention in order to improve the following deficits and impairments:  Hypomobility,Pain,Decreased strength  Visit Diagnosis: Chronic left shoulder pain - Plan: PT plan of care cert/re-cert  Chronic right shoulder pain - Plan: PT plan of care cert/re-cert  Muscle weakness (generalized) - Plan: PT plan of care cert/re-cert     Problem List Patient Active Problem List   Diagnosis Date Noted  . Uterine scar from previous cesarean delivery affecting pregnancy 12/31/2018  . Anti-D antibodies present during pregnancy 0Jun 05, 2020 . BRCA gene mutation negative 05/04/2018  . History of cesarean delivery 02/10/2018  . Rh negative state in antepartum period 08/23/2017  . History of eclampsia 07/22/2017  . Migraines 04/09/2017   JPhillips GroutPT, DPT, GCS  Jevan Gaunt 06/14/2020, 4:25  PM  [facility]MPine [place] [address]Phone: [phone]  Fax:  [phone] Name: KGeorgina Peer  Free MRN: [id number] Date of Birth: November 21, 1990

## 2020-06-15 ENCOUNTER — Ambulatory Visit: Payer: 59

## 2020-06-20 ENCOUNTER — Ambulatory Visit: Payer: 59

## 2020-06-20 DIAGNOSIS — M6281 Muscle weakness (generalized): Secondary | ICD-10-CM

## 2020-06-20 DIAGNOSIS — G8929 Other chronic pain: Secondary | ICD-10-CM

## 2020-06-20 DIAGNOSIS — M25512 Pain in left shoulder: Secondary | ICD-10-CM | POA: Diagnosis not present

## 2020-06-20 DIAGNOSIS — M25511 Pain in right shoulder: Secondary | ICD-10-CM | POA: Diagnosis not present

## 2020-06-20 NOTE — Therapy (Signed)
[facility] [address] [place], [place], [address] Phone: [phone]   Fax:  [phone]  Physical Therapy Treatment  Patient Details  Name: Denise Walker MRN: [id number] Date of Birth: [birth year] Referring Provider (PT): Dr. Alba Destine   Encounter Date: 06/20/2020   PT End of Session - 06/20/20 2047    Visit Number 2    Number of Visits 17    Date for PT Re-Evaluation 08/08/20    Authorization Type eval: 06/13/20    PT Start Time 0845    PT Stop Time 0930    PT Time Calculation (min) 45 min    Activity Tolerance Patient tolerated treatment well    Behavior During Therapy [facility] for tasks assessed/performed           Past Medical History:  Diagnosis Date  . Anxiety   . BRCA negative 04/2018   MyRisk neg; IBIS=8.9%/riskscore=6.7%  . Eclampsia   . Family history of ovarian cancer   . Gestational diabetes   . Migraine headache   . PCOS (polycystic ovarian syndrome)     Past Surgical History:  Procedure Laterality Date  . CESAREAN SECTION N/A 07/15/2014   Procedure: CESAREAN SECTION;  Surgeon: Gae Dry, MD;  Location: [facility] ORS;  Service: Obstetrics;  Laterality: N/A;  . CESAREAN SECTION N/A 02/10/2018   Procedure: CESAREAN SECTION;  Surgeon: Will Bonnet, MD;  Location: [facility] ORS;  Service: Obstetrics;  Laterality: N/A;  . CESAREAN SECTION N/A 12/31/2018   Procedure: CESAREAN SECTION;  Surgeon: [name], MD;  Location: [facility] ORS;  Service: Obstetrics;  Laterality: N/A;  . HERNIA REPAIR  10/2013  . WISDOM TOOTH EXTRACTION      There were no vitals filed for this visit.   Subjective Assessment - 06/20/20 2045    Subjective Pt reports that she is doing alright today. She continues with significant L shoulder pain. No specific questions or concerns upon arrival.    Pertinent History Pt reports that she started with bilateral shoulder pain in 2020 after her 32 year old was born. She has 88 kids (85 years old and  younger with the two younger kids having been born 18 months apart). 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 Pt is L hand dominant. She has received steroid injections in both shoulders and had significant improvement in her R shoulder however her L shoulder has remained painful. She describes her shoulder pain as achy and sore. It is worse with lifting, touch, and reaching overhead with her LUE. Improves with rest, heat, and removing her bra. She also has chronic back pain but has had TFESI injections which have helped significantly. She reports numbness and weakness in her LLE but no LUE numbness or pain.    Limitations Lifting;House hold activities    Diagnostic tests Unable to find record of shoulder imaging    Patient Stated Goals Decrease L shoulder pain when lifting daughters out of crib and when putting them into her car    Currently in Pain? Yes    Pain Score 7     Pain Location Shoulder    Pain Orientation Left    Pain Descriptors / Indicators Aching;Sore    Pain Type Chronic pain    Pain Onset More than a month ago    Pain Frequency Constant                 TREATMENT   Manual Therapy  L shoulder PROM  in all directions; L shoulder A/P mobilization, grade I-II, 30s/bout x 3 bouts; L shoulder distraction mobilizations at different positions through L shoulder abduction, grade I-II, 30s/bout x 3 bouts; L shoulder inferior mobilizations at 90 abduction, grade I-II, 30s/bout x 3 bouts; L shoulder posterior and inferior mobilization at 120 flexion, grade I-II, 30s/bout x 3 bouts; STM to L upper trap, levator scapula, and supraspinatus; HEP progressed and demonstrated   Trigger Point Dry Needling (TDN), unbilled Education performed with patient regarding potential benefit of TDN. Reviewed precautions and risks with patient. Reviewed special precautions/risks over lung fields which include pneumothorax. Reviewed signs and symptoms of  pneumothorax and advised pt to go to ER immediately if these symptoms develop advise them of dry needling treatment. Extensive time spent with pt to ensure full understanding of TDN risks. Pt provided verbal consent to treatment. TDN performed to L upper trap with 1, 0.25 x 60 single needle placement with local twitch response (LTR). Pistoning technique utilized. Pt reports soreness after intervention;   Initiated manual techniques for L shoulder pain today including low grade glenohumeral mobilizations and soft tissue mobilization. Pt is very tender to palpation. Utilized trigger point dry needling to L upper trap and pt reports soreness afterward. Will assess response to see if it is a helpful modality moving forward. HEP progressed today and education provided. Pt encouraged to follow-up as scheduled. Pt will benefit from PT services to address deficits in L shoulder pain and strength in order to return to full function at home.                         PT Education - 06/20/20 2047    Education Details HEP progression    Person(s) Educated Patient    Methods Explanation    Comprehension Verbalized understanding            PT Short Term Goals - 06/14/20 1606      PT SHORT TERM GOAL #1   Title Pt will be independent with HEP in order to improve shoulder strength and decrease pain in order to improve pain-free function at home and work.    Time 4    Period Weeks    Status New    Target Date 07/12/20             PT Long Term Goals - 06/14/20 1607      PT LONG TERM GOAL #1   Title Pt will decrease quick DASH score by at least 8% in order to demonstrate clinically significant reduction in disability.    Baseline 06/13/20: Pt to complete at next visit    Time 8    Period Weeks    Status New    Target Date 08/08/20      PT LONG TERM GOAL #2   Title Pt will decrease worst pain as reported on NPRS by at least 3 points in order to demonstrate clinically significant  reduction in pain.    Baseline 06/13/20: worst: 8/10;    Time 8    Period Weeks    Status New    Target Date 08/08/20      PT LONG TERM GOAL #3   Title Pt will be able to lift her daughters into her car and pick them up out of the crib while reporting at worst 2/10 L shoulder pain    Baseline 06/13/20: 8/10 with lifting    Time 8    Period Weeks  Status New    Target Date 08/08/20      PT LONG TERM GOAL #4   Title Pt will report at least 75% improvement in her L shoulder symptoms in order to resume full responsibilities at home with less pain    Time 8    Period Weeks    Status New    Target Date 08/08/20                 Plan - 06/20/20 0925    Clinical Impression Statement Initiated manual techniques for L shoulder pain today including low grade glenohumeral mobilizations and soft tissue mobilization. Pt is very tender to palpation. Utilized trigger point dry needling to L upper trap and pt reports soreness afterward. Will assess response to see if it is a helpful modality moving forward. HEP progressed today and education provided. Pt encouraged to follow-up as scheduled. Pt will benefit from PT services to address deficits in L shoulder pain and strength in order to return to full function at home.    Personal Factors and Comorbidities Past/Current Experience;Time since onset of injury/illness/exacerbation;Comorbidity 2    Comorbidities Anxiety, migraines    Examination-Activity Limitations Carry;Reach Overhead;Lift;Bathing;Dressing;Caring for Others    Examination-Participation Restrictions Community Activity;Interpersonal Relationship;Meal Prep;Occupation;Shop    Stability/Clinical Decision Making Evolving/Moderate complexity    Rehab Potential Good    PT Frequency 2x / week    PT Duration 8 weeks    PT Treatment/Interventions ADLs/Self Care Home Management;Aquatic Therapy;Biofeedback;Canalith Repostioning;Cryotherapy;Electrical Stimulation;Iontophoresis 68m/ml  Dexamethasone;Moist Heat;Traction;Ultrasound;DME Instruction;Gait training;Stair training;Functional mobility training;Therapeutic activities;Therapeutic exercise;Balance training;Neuromuscular re-education;Cognitive remediation;Patient/family education;Manual techniques;Passive range of motion;Dry needling;Vestibular;Spinal Manipulations;Joint Manipulations    PT Next Visit Plan thoracic and cervical mobs, posture education, L upper quarter STM, consider TDN, initiate light strengthening/stretching    PT Home Exercise Plan Access Code: [email or web address]   Consulted and Agree with Plan of Care Patient           Patient will benefit from skilled therapeutic intervention in order to improve the following deficits and impairments:  Hypomobility,Pain,Decreased strength  Visit Diagnosis: Chronic left shoulder pain  Muscle weakness (generalized)     Problem List Patient Active Problem List   Diagnosis Date Noted  . Uterine scar from previous cesarean delivery affecting pregnancy 12/31/2018  . Anti-D antibodies present during pregnancy 006/26/20 . BRCA gene mutation negative 05/04/2018  . History of cesarean delivery 02/10/2018  . Rh negative state in antepartum period 08/23/2017  . History of eclampsia 07/22/2017  . Migraines 04/09/2017   JPhillips GroutPT, DPT, GCS  Beau Vanduzer 06/20/2020, 9:00 PM  [place] [facility] [place] [address]Phone: [phone]  Fax:  [phone] Name: KKeir FolandMRN: [id number] of Birth: 912/28/1992

## 2020-06-20 NOTE — Patient Instructions (Signed)
Access Code: [email or web address] URL: [email or web address] Date: 06/20/2020 Prepared by: Ria Comment  Exercises Seated Gentle Upper Trapezius Stretch - 2 x daily - 7 x weekly - 30s x 3 bilateral hold Seated Cervical Sidebending Stretch - 2 x daily - 7 x weekly - 3 x 30s bilateral hold Seated Scapular Retraction - 1 x daily - 7 x weekly - 2 sets - 10 reps - 3s hold Standing Row with Resistance - 1 x daily - 7 x weekly - 2 sets - 10 reps - 3s hold Correct Seated Posture - 7 x weekly

## 2020-06-21 NOTE — Patient Instructions (Incomplete)
TREATMENT   Manual Therapy  L shoulder PROM in all directions; L shoulder A/P mobilization, grade I-II, 30s/bout x 3 bouts; L shoulder distraction mobilizations at different positions through L shoulder abduction, grade I-II, 30s/bout x 3 bouts; L shoulder inferior mobilizations at 90 abduction, grade I-II, 30s/bout x 3 bouts; L shoulder posterior and inferior mobilization at 120 flexion, grade I-II, 30s/bout x 3 bouts; STM to L upper trap, levator scapula, and supraspinatus; HEP progressed and demonstrated   Trigger Point Dry Needling (TDN), unbilled Education performed with patient regarding potential benefit of TDN. Reviewed precautions and risks with patient. Reviewed special precautions/risks over lung fields which include pneumothorax. Reviewed signs and symptoms of pneumothorax and advised pt to go to ER immediately if these symptoms develop advise them of dry needling treatment. Extensive time spent with pt to ensure full understanding of TDN risks. Pt provided verbal consent to treatment. TDN performed to L upper trap with 1, 0.25 x 60 single needle placement with local twitch response (LTR). Pistoning technique utilized. Pt reports soreness after intervention;   Initiated manual techniques for L shoulder pain today including low grade glenohumeral mobilizations and soft tissue mobilization. Pt is very tender to palpation. Utilized trigger point dry needling to L upper trap and pt reports soreness afterward. Will assess response to see if it is a helpful modality moving forward. HEP progressed today and education provided. Pt encouraged to follow-up as scheduled. Pt will benefit from PT services to address deficits in L shoulder pain and strength in order to return to full function at home.

## 2020-06-22 ENCOUNTER — Ambulatory Visit: Payer: 59

## 2020-06-22 DIAGNOSIS — M6281 Muscle weakness (generalized): Secondary | ICD-10-CM

## 2020-06-22 DIAGNOSIS — G8929 Other chronic pain: Secondary | ICD-10-CM

## 2020-06-28 ENCOUNTER — Ambulatory Visit: Payer: 59

## 2020-06-28 ENCOUNTER — Other Ambulatory Visit: Payer: Self-pay

## 2020-06-28 DIAGNOSIS — G8929 Other chronic pain: Secondary | ICD-10-CM | POA: Diagnosis not present

## 2020-06-28 DIAGNOSIS — M6281 Muscle weakness (generalized): Secondary | ICD-10-CM

## 2020-06-28 DIAGNOSIS — M25512 Pain in left shoulder: Secondary | ICD-10-CM | POA: Diagnosis not present

## 2020-06-28 DIAGNOSIS — M25511 Pain in right shoulder: Secondary | ICD-10-CM | POA: Diagnosis not present

## 2020-06-28 NOTE — Therapy (Signed)
[facility] [address]. [place], [place], [address] Phone: [phone]   Fax:  [phone]  Physical Therapy Treatment  Patient Details  Name: Denise Walker MRN: [id number] Date of Birth: July 25, 1990 Referring Provider (PT): Dr. Alba Destine   Encounter Date: 06/28/2020   PT End of Session - 06/28/20 1104    Visit Number 3    Number of Visits 17    Date for PT Re-Evaluation 08/08/20    Authorization Type eval: 06/13/20    PT Start Time 1105    PT Stop Time 1150    PT Time Calculation (min) 45 min    Activity Tolerance Patient tolerated treatment well    Behavior During Therapy [facility] for tasks assessed/performed           Past Medical History:  Diagnosis Date  . Anxiety   . BRCA negative 04/2018   MyRisk neg; IBIS=8.9%/riskscore=6.7%  . Eclampsia   . Family history of ovarian cancer   . Gestational diabetes   . Migraine headache   . PCOS (polycystic ovarian syndrome)     Past Surgical History:  Procedure Laterality Date  . CESAREAN SECTION N/A 07/15/2014   Procedure: CESAREAN SECTION;  Surgeon: Gae Dry, MD;  Location: [facility] ORS;  Service: Obstetrics;  Laterality: N/A;  . CESAREAN SECTION N/A 02/10/2018   Procedure: CESAREAN SECTION;  Surgeon: Will Bonnet, MD;  Location: [facility] ORS;  Service: Obstetrics;  Laterality: N/A;  . CESAREAN SECTION N/A 12/31/2018   Procedure: CESAREAN SECTION;  Surgeon: [name], MD;  Location: [facility] ORS;  Service: Obstetrics;  Laterality: N/A;  . HERNIA REPAIR  10/2013  . WISDOM TOOTH EXTRACTION      There were no vitals filed for this visit.   Subjective Assessment - 06/28/20 1103    Subjective Pt reports that she is doing alright today. She continues with significant L shoulder pain. She was sore after dry needling last session however after soreness she reports improvement in her shoulder pain. No specific questions upon arrival.    Pertinent History Pt reports that she  started with bilateral shoulder pain in 2020 after her 31 year old was born. She has 28 kids (62 years old and younger with the two younger kids having been born 45 months apart). 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 Pt is L hand dominant. She has received steroid injections in both shoulders and had significant improvement in her R shoulder however her L shoulder has remained painful. She describes her shoulder pain as achy and sore. It is worse with lifting, touch, and reaching overhead with her LUE. Improves with rest, heat, and removing her bra. She also has chronic back pain but has had TFESI injections which have helped significantly. She reports numbness and weakness in her LLE but no LUE numbness or pain.    Limitations Lifting;House hold activities    Diagnostic tests Unable to find record of shoulder imaging    Patient Stated Goals Decrease L shoulder pain when lifting daughters out of crib and when putting them into her car    Currently in Pain? Yes    Pain Location Shoulder    Pain Orientation Left    Pain Descriptors / Indicators Sore    Pain Type Chronic pain    Pain Onset More than a month ago    Pain Frequency Constant  TREATMENT   Manual Therapy  Prone CPA C2-T5, grade II mobilizations, 30s/bout x 1 bout/level; Prone L UPA C2-C6, grade II mobilizations, 30s/bout x 1 bout/level; Prone L C1 posterior to anterior mobilizations, 30s/bout x 2 bouts; Supine L shoulder PROM in all directions; Supine L shoulder A/P mobilization, grade I-II, 30s/bout x 3 bouts; Supine L shoulder distraction mobilizations at different positions through L shoulder abduction, grade I-II, 30s/bout x 3 bouts; Supine L shoulder inferior mobilizations at 90 abduction, grade I-II, 30s/bout x 3 bouts; Supine L shoulder posterior and inferior mobilization at 120 flexion, grade I-II, 30s/bout x 3 bouts; STM to L upper trap, levator scapula,  and supraspinatus;   Trigger Point Dry Needling (TDN), unbilled Education previously provided to patient regarding potential benefit of TDN.  Previously reviewed precautions and risks with patient. Previously reviewed special precautions/risks over lung fields which include pneumothorax. Previously reviewed signs and symptoms of pneumothorax and advised pt to go to ER immediately if these symptoms develop abd advise them of dry needling treatment. Extensive time spent with pt to ensure full understanding of TDN risks. Pt provided verbal consent to treatment. Using clean technique, TDN performed to L upper trap with 2, 0.25 x 60 single needle placement with local twitch response (LTR). Pistoning technique utilized. Pt reports soreness after intervention;   Initiated cervical and thoracic mobilizations and continued manual techniques for L shoulder pain including low grade glenohumeral mobilizations. Also repeated soft tissue mobilization. Pt is very tender to palpation again today. Once again utilized trigger point dry needling to L upper trap and pt reports soreness afterward. Pt is making good progress toward her goal of decreased shoulder pain. No HEP progression today. Pt encouraged to follow-up as scheduled. Pt will benefit from PT services to address deficits in L shoulder pain and strength in order to return to full function at home.                           PT Short Term Goals - 06/14/20 1606      PT SHORT TERM GOAL #1   Title Pt will be independent with HEP in order to improve shoulder strength and decrease pain in order to improve pain-free function at home and work.    Time 4    Period Weeks    Status New    Target Date 07/12/20             PT Long Term Goals - 06/14/20 1607      PT LONG TERM GOAL #1   Title Pt will decrease quick DASH score by at least 8% in order to demonstrate clinically significant reduction in disability.    Baseline 06/13/20: Pt to  complete at next visit    Time 8    Period Weeks    Status New    Target Date 08/08/20      PT LONG TERM GOAL #2   Title Pt will decrease worst pain as reported on NPRS by at least 3 points in order to demonstrate clinically significant reduction in pain.    Baseline 06/13/20: worst: 8/10;    Time 8    Period Weeks    Status New    Target Date 08/08/20      PT LONG TERM GOAL #3   Title Pt will be able to lift her daughters into her car and pick them up out of the crib while reporting at worst 2/10 L shoulder pain  Baseline 06/13/20: 8/10 with lifting    Time 8    Period Weeks    Status New    Target Date 08/08/20      PT LONG TERM GOAL #4   Title Pt will report at least 75% improvement in her L shoulder symptoms in order to resume full responsibilities at home with less pain    Time 8    Period Weeks    Status New    Target Date 08/08/20                 Plan - 06/28/20 1104    Clinical Impression Statement Initiated cervical and thoracic mobilizations and continued manual techniques for L shoulder pain including low grade glenohumeral mobilizations. Also repeated soft tissue mobilization. Pt is very tender to palpation again today. Once again utilized trigger point dry needling to L upper trap and pt reports soreness afterward. Pt is making good progress toward her goal of decreased shoulder pain. No HEP progression today. Pt encouraged to follow-up as scheduled. Pt will benefit from PT services to address deficits in L shoulder pain and strength in order to return to full function at home.    Personal Factors and Comorbidities Past/Current Experience;Time since onset of injury/illness/exacerbation;Comorbidity 2    Comorbidities Anxiety, migraines    Examination-Activity Limitations Carry;Reach Overhead;Lift;Bathing;Dressing;Caring for Others    Examination-Participation Restrictions Community Activity;Interpersonal Relationship;Meal Prep;Occupation;Shop     Stability/Clinical Decision Making Evolving/Moderate complexity    Rehab Potential Good    PT Frequency 2x / week    PT Duration 8 weeks    PT Treatment/Interventions ADLs/Self Care Home Management;Aquatic Therapy;Biofeedback;Canalith Repostioning;Cryotherapy;Electrical Stimulation;Iontophoresis 34m/ml Dexamethasone;Moist Heat;Traction;Ultrasound;DME Instruction;Gait training;Stair training;Functional mobility training;Therapeutic activities;Therapeutic exercise;Balance training;Neuromuscular re-education;Cognitive remediation;Patient/family education;Manual techniques;Passive range of motion;Dry needling;Vestibular;Spinal Manipulations;Joint Manipulations    PT Next Visit Plan thoracic and cervical mobs, posture education, L upper quarter STM, TDN, initiate light strengthening/stretching    PT Home Exercise Plan Access Code: [email or web address]   Consulted and Agree with Plan of Care Patient           Patient will benefit from skilled therapeutic intervention in order to improve the following deficits and impairments:  Hypomobility,Pain,Decreased strength  Visit Diagnosis: Chronic left shoulder pain  Muscle weakness (generalized)     Problem List Patient Active Problem List   Diagnosis Date Noted  . Uterine scar from previous cesarean delivery affecting pregnancy 12/31/2018  . Anti-D antibodies present during pregnancy 006/14/2020 . BRCA gene mutation negative 05/04/2018  . History of cesarean delivery 02/10/2018  . Rh negative state in antepartum period 08/23/2017  . History of eclampsia 07/22/2017  . Migraines 04/09/2017  . Anxiety disorder 12/01/2014   JPhillips GroutPT, DPT, GCS  , 06/29/2020, 11:32 AM   [facility] [place] [address]Phone: [phone]  Fax:  [phone] Name: KChristabelle HanzlikMRN: [id number] of Birth: 9May 11, 1992

## 2020-06-29 ENCOUNTER — Telehealth (INDEPENDENT_AMBULATORY_CARE_PROVIDER_SITE_OTHER): Payer: 59 | Admitting: Psychiatry

## 2020-06-29 ENCOUNTER — Encounter: Payer: Self-pay | Admitting: Psychiatry

## 2020-06-29 DIAGNOSIS — F411 Generalized anxiety disorder: Secondary | ICD-10-CM

## 2020-06-29 DIAGNOSIS — Z79899 Other long term (current) drug therapy: Secondary | ICD-10-CM | POA: Diagnosis not present

## 2020-06-29 DIAGNOSIS — F331 Major depressive disorder, recurrent, moderate: Secondary | ICD-10-CM | POA: Diagnosis not present

## 2020-06-29 DIAGNOSIS — Z8659 Personal history of other mental and behavioral disorders: Secondary | ICD-10-CM | POA: Diagnosis not present

## 2020-06-29 MED ORDER — CITALOPRAM HYDROBROMIDE 10 MG PO TABS: 10.0000 mg | ORAL_TABLET | Freq: Every day | ORAL | 1 refills | Status: DC

## 2020-06-29 MED ORDER — HYDROXYZINE HCL 25 MG PO TABS: 25.0000 mg | ORAL_TABLET | Freq: Every day | ORAL | 1 refills | Status: DC | PRN

## 2020-06-29 MED ORDER — CLONAZEPAM 0.5 MG PO TABS: 0.5000 mg | ORAL_TABLET | ORAL | 0 refills | Status: DC

## 2020-06-29 NOTE — Patient Instructions (Signed)
Clonazepam tablets What is this medicine? CLONAZEPAM (kloe NA ze pam) is a benzodiazepine. It is used to treat certain types of seizures. It is also used to treat panic disorder. This medicine may be used for other purposes; ask your health care provider or pharmacist if you have questions. COMMON BRAND NAME(S): Ceberclon, Klonopin What should I tell my health care provider before I take this medicine? They need to know if you have any of these conditions:  an alcohol or drug abuse problem  bipolar disorder, depression, psychosis or other mental health condition  glaucoma  kidney or liver disease  lung or breathing disease  myasthenia gravis  Parkinson's disease  porphyria  seizures or a history of seizures  suicidal thoughts  an unusual or allergic reaction to clonazepam, other benzodiazepines, foods, dyes, or preservatives  pregnant or trying to get pregnant  breast-feeding How should I use this medicine? Take this medicine by mouth with a glass of water. Follow the directions on the prescription label. If it upsets your stomach, take it with food or milk. Take your medicine at regular intervals. Do not take it more often than directed. Do not stop taking or change the dose except on the advice of your doctor or health care professional. A special MedGuide will be given to you by the pharmacist with each prescription and refill. Be sure to read this information carefully each time. Talk to your pediatrician regarding the use of this medicine in children. Special care may be needed. Overdosage: If you think you have taken too much of this medicine contact a poison control center or emergency room at once. NOTE: This medicine is only for you. Do not share this medicine with others. What if I miss a dose? If you miss a dose, take it as soon as you can. If it is almost time for your next dose, take only that dose. Do not take double or extra doses. What may interact with this  medicine? Do not take this medication with any of the following medicines:  narcotic medicines for cough  sodium oxybate This medicine may also interact with the following medications:  alcohol  antihistamines for allergy, cough and cold  antiviral medicines for HIV or AIDS  certain medicines for anxiety or sleep  certain medicines for depression, like amitriptyline, fluoxetine, sertraline  certain medicines for fungal infections like ketoconazole and itraconazole  certain medicines for seizures like carbamazepine, phenobarbital, phenytoin, primidone  general anesthetics like halothane, isoflurane, methoxyflurane, propofol  local anesthetics like lidocaine, pramoxine, tetracaine  medicines that relax muscles for surgery  narcotic medicines for pain  phenothiazines like chlorpromazine, mesoridazine, prochlorperazine, thioridazine This list may not describe all possible interactions. Give your health care provider a list of all the medicines, herbs, non-prescription drugs, or dietary supplements you use. Also tell them if you smoke, drink alcohol, or use illegal drugs. Some items may interact with your medicine. What should I watch for while using this medicine? Tell your doctor or health care professional if your symptoms do not start to get better or if they get worse. Do not stop taking except on your doctor's advice. You may develop a severe reaction. Your doctor will tell you how much medicine to take. You may get drowsy or dizzy. Do not drive, use machinery, or do anything that needs mental alertness until you know how this medicine affects you. To reduce the risk of dizzy and fainting spells, do not stand or sit up quickly, especially if you are   an older patient. Alcohol may increase dizziness and drowsiness. Avoid alcoholic drinks. If you are taking another medicine that also causes drowsiness, you may have more side effects. Give your health care provider a list of all  medicines you use. Your doctor will tell you how much medicine to take. Do not take more medicine than directed. Call emergency for help if you have problems breathing or unusual sleepiness. The use of this medicine may increase the chance of suicidal thoughts or actions. Pay special attention to how you are responding while on this medicine. Any worsening of mood, or thoughts of suicide or dying should be reported to your health care professional right away. What side effects may I notice from receiving this medicine? Side effects that you should report to your doctor or health care professional as soon as possible:  allergic reactions like skin rash, itching or hives, swelling of the face, lips, or tongue  breathing problems  confusion  loss of balance or coordination  signs and symptoms of low blood pressure like dizziness; feeling faint or lightheaded, falls; unusually weak or tired  suicidal thoughts or mood changes Side effects that usually do not require medical attention (report to your doctor or health care professional if they continue or are bothersome):  dizziness  headache  tiredness  upset stomach This list may not describe all possible side effects. Call your doctor for medical advice about side effects. You may report side effects to FDA at 1-800-FDA-1088. Where should I keep my medicine? Keep out of the reach of children. This medicine can be abused. Keep your medicine in a safe place to protect it from theft. Do not share this medicine with anyone. Selling or giving away this medicine is dangerous and against the law. This medicine may cause accidental overdose and death if taken by other adults, children, or pets. Mix any unused medicine with a substance like cat litter or coffee grounds. Then throw the medicine away in a sealed container like a sealed bag or a coffee can with a lid. Do not use the medicine after the expiration date. Store at room temperature between  15 and 30 degrees C (59 and 86 degrees F). Protect from light. Keep container tightly closed. NOTE: This sheet is a summary. It may not cover all possible information. If you have questions about this medicine, talk to your doctor, pharmacist, or health care provider.  2021 Elsevier/Gold Standard (2015-07-07 18:46:32)  

## 2020-06-29 NOTE — Progress Notes (Signed)
Virtual Visit via Video Note  I connected with Denise Walker on 06/29/20 at 11:00 AM EDT by a video enabled telemedicine application and verified that I am speaking with the correct person using two identifiers.  Location Provider Location : [facility] Patient Location : Home  Participants: Patient , Provider    I discussed the limitations of evaluation and management by telemedicine and the availability of in person appointments. The patient expressed understanding and agreed to proceed.  I discussed the assessment and treatment plan with the patient. The patient was provided an opportunity to ask questions and all were answered. The patient agreed with the plan and demonstrated an understanding of the instructions.   The patient was advised to call back or seek an in-person evaluation if the symptoms worsen or if the condition fails to improve as anticipated.    Psychiatric Initial Adult Assessment   Patient Identification: Denise Walker MRN:  [id number] Date of Evaluation:  06/29/2020 Referral Source: Dr.Staebler Chief Complaint:   Chief Complaint    Establish Care; Depression; Anxiety     Visit Diagnosis:    ICD-10-CM   1. Moderate episode of recurrent major depressive disorder (HCC)  F33.1 citalopram (CELEXA) 10 MG tablet   with mixed features   2. GAD (generalized anxiety disorder)  F41.1 clonazePAM (KLONOPIN) 0.5 MG tablet    citalopram (CELEXA) 10 MG tablet    hydrOXYzine (ATARAX/VISTARIL) 25 MG tablet  3. High risk medication use  Z79.899 EKG 12-Lead  4. History of ADHD  Z86.59     History of Present Illness:  Denise Walker is a 30 year old married Caucasian female, stay-at-home mother, lives in [place], has a history of postpartum depression ( 2016), generalized anxiety disorder, PCOS, bilateral shoulder pain , low back pain with left lumbar radiculopathy, history of eclampsia, was evaluated by telemedicine today.  Patient was under the care of Dr. Nicolasa Ducking previously and  most recently her medications were managed by her primary care provider Dr. Georgianne Fick.  Patient here today to establish care for medication management.  Patient reports she was diagnosed with postpartum depression after her 34-year-old daughter was born in 2016.  Patient reports she developed eclampsia at that time and did not have any memory about her delivery of the child.  When she woke up she did not see her baby in the room and she could not walk and go see her baby in the nursery since she was still on treatment and was not allowed to do so.  She reports she went into a panic mode where she felt that something bad had happened to her baby.  This triggered her to the point that she became psychotic.  She reports she would stay up all night without sleeping to make sure her baby was alright.  She became very paranoid and could not trust anyone else with her baby.  She was admitted to [facility] for 2 weeks at that time for mental health stabilization.  Patient currently reports depressive symptoms of sadness, low motivation, concentration problems, lack of energy, irritability, anxiety as well as sleep problems.  She is currently on Abilify, Lamictal.  She reports this combination has made a huge difference and has helped her with her symptoms a lot.  Most nights she is able to sleep okay.  She reports she takes trazodone which helps.  There are some nights when her sleep is still restless.  Patient does struggle with anxiety symptoms.  She is a Research officer, trade union, worries about everything to the extreme.  She reports her anxiety symptoms as getting worse since the past few weeks.  She reports her younger daughter who is 86-year-old has multiple medical problems going on.  She has a diagnosis of laryngomalacia and  has problems with aspiration, and is on feeding treatments.  Patient reports there has been multiple visits to the hospital as well as the recent emergency department visit and these are all very anxiety provoking  for her.  Patient reports she is very protective of her daughter and wants to make sure she is all right and hence watches over her.  She constantly worries about something bad happening to her.  There has been times when she has thought about the worst case scenarios.  She reports she was on Celexa however she stopped taking it a few months ago.  However per review of notes per Dr. Nicolasa Ducking dated 03/06/2020 patient was advised to continue Celexa.  Patient reports she did not know that she was advised to continue it since she has not been on it since the past several months.  Patient does report a history of ADHD.  She was diagnosed in third grade.  Patient reports she struggles with concentration, focus, completing task, keeping up with projects and assignments, being forgetful, making careless mistakes and so on.  She was tried on medications like Concerta, Ritalin in the past.  Patient currently reports she is not on any medications for ADHD.  She certainly feels her attention and focus problems does have an impact on her mood, especially her anxiety.  She reports she is unable to take care of her home since she is easily distracted and that is all over the place.  Patient denies any manic or hypomanic symptoms.  She reports there has been some mention of bipolar disorder in the past however she was never formally diagnosed.  Patient denies any suicidality, homicidality, did not appear to be preoccupied with any delusions and denies any other perceptual disturbances today.  Patient denies any substance abuse problems.     Associated Signs/Symptoms: Depression Symptoms:  depressed mood, anhedonia, insomnia, fatigue, feelings of worthlessness/guilt, difficulty concentrating, anxiety, (Hypo) Manic Symptoms:  Distractibility, Irritable Mood, Labiality of Mood, Anxiety Symptoms:  Excessive Worry, Psychotic Symptoms:  paranoia in the past per history PTSD Symptoms: Had a traumatic exposure:  as  noted above  Past Psychiatric History: Patient was under the care of Dr. Nicolasa Ducking previously, diagnosis of major depressive disorder, generalized anxiety disorder, history of ADHD as a child, insomnia.  Patient denies suicide attempts.  Patient reports inpatient mental health admission to [facility] in 2016 for 2 weeks.  Previous Psychotropic Medications: Yes Past trials of medications like Wellbutrin, Lexapro, Zoloft, Celexa, Trintellix, Concerta, Ritalin  Substance Abuse History in the last 12 months:  No.  Consequences of Substance Abuse: Negative  Past Medical History:  Past Medical History:  Diagnosis Date  . Anxiety   . Back pain    low back pain with lumbar radiculitis  . Bilateral shoulder pain   . BRCA negative 04/2018   MyRisk neg; IBIS=8.9%/riskscore=6.7%  . Eclampsia   . Family history of ovarian cancer   . Gestational diabetes   . Migraine headache   . PCOS (polycystic ovarian syndrome)     Past Surgical History:  Procedure Laterality Date  . CESAREAN SECTION N/A 07/15/2014   Procedure: CESAREAN SECTION;  Surgeon: Gae Dry, Denise Walker;  Location: [facility] ORS;  Service: Obstetrics;  Laterality: N/A;  . CESAREAN SECTION N/A 02/10/2018   Procedure:  CESAREAN SECTION;  Surgeon: Will Bonnet, Denise Walker;  Location: [facility] ORS;  Service: Obstetrics;  Laterality: N/A;  . CESAREAN SECTION N/A 12/31/2018   Procedure: CESAREAN SECTION;  Surgeon: [name], Denise Walker;  Location: [facility] ORS;  Service: Obstetrics;  Laterality: N/A;  . CESAREAN SECTION    . HERNIA REPAIR  10/2013  . WISDOM TOOTH EXTRACTION      Family Psychiatric History: Daughter-ADHD, ODD  Family History:  Family History  Problem Relation Age of Onset  . Ovarian cancer Mother   . Arthritis Mother   . Diabetes Paternal Grandfather   . Diabetes Paternal Aunt   . Ovarian cancer Maternal Aunt   . Breast cancer Maternal Grandmother   . Uterine cancer Cousin   . ADD / ADHD Daughter   . ODD Daughter     Social History:    Social History   Socioeconomic History  . Marital status: Married    Spouse name: Einar Pheasant  . Number of children: 3  . Years of education: high school  . Highest education level: Not on file  Occupational History  . Not on file  Tobacco Use  . Smoking status: Never Smoker  . Smokeless tobacco: Never Used  Vaping Use  . Vaping Use: Never used  Substance and Sexual Activity  . Alcohol use: Yes    Comment: occasional wine cooler  . Drug use: Never  . Sexual activity: Yes    Birth control/protection: I.U.D.    Comment: will discuss with Denise Walker  Other Topics Concern  . Not on file  Social History Narrative  . Not on file   Social Determinants of Health   Financial Resource Strain: Not on file  Food Insecurity: Not on file  Transportation Needs: Not on file  Physical Activity: Not on file  Stress: Not on file  Social Connections: Not on file    Additional Social History: Patient was born and raised in [place] by both her biological parents.  Her mother died of cancer when she was 59 years old.  Her father is still living and he lives in [place], [place].  Patient graduated high school.  She has 1 older brother and 1 younger brother.  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  Patient currently is a stay-at-home mom, lives in [place].  She has been married since the past 9 years and has 3 daughters, aged 32, 2, 52.  Her husband works as a Administrator.  Patient denies any legal problems.  Allergies:   Allergies  Allergen Reactions  . Fish Allergy Shortness Of Breath  . Shellfish Allergy Shortness Of Breath    Metabolic Disorder Labs: Lab Results  Component Value Date   HGBA1C 5.5 06/08/2020   Lab Results  Component Value Date   PROLACTIN 8.8 03/27/2017   Lab Results  Component Value Date   CHOL 175 06/08/2020   TRIG 189 (H) 06/08/2020   HDL 47 06/08/2020   CHOLHDL 3.7 06/08/2020   LDLCALC 96 06/08/2020   Lab Results  Component Value Date    TSH 4.350 09/22/2019    Therapeutic Level Labs: No results found for: LITHIUM No results found for: CBMZ No results found for: VALPROATE  Current Medications: Current Outpatient Medications  Medication Sig Dispense Refill  . citalopram (CELEXA) 10 MG tablet Take 1 tablet (10 mg total) by mouth daily. 30 tablet 1  . clonazePAM (KLONOPIN) 0.5 MG tablet Take 1 tablet (0.5 mg total) by mouth as  directed. Take 1 tablet 1-2 times a week for severe anxiety attacks only 8 tablet 0  . ARIPiprazole (ABILIFY) 5 MG tablet Take 1 tablet (5 mg total) by mouth daily. 90 tablet 3  . Butalbital-APAP-Caffeine 50-325-40 MG capsule Take by mouth.    . cyclobenzaprine (FLEXERIL) 10 MG tablet Take 1 tablet by mouth as needed.    . hydrOXYzine (ATARAX/VISTARIL) 25 MG tablet Take 1-2 tablets (25-50 mg total) by mouth daily as needed for anxiety. 60 tablet 1  . lamoTRIgine (LAMICTAL) 200 MG tablet Take 1 tablet (200 mg total) by mouth daily. 90 tablet 0  . levonorgestrel (MIRENA) 20 MCG/24HR IUD by Intrauterine route.    . traZODone (DESYREL) 50 MG tablet      No current facility-administered medications for this visit.    Musculoskeletal: Strength & Muscle Tone: UTA Gait & Station: UTA Patient leans: N/A  Psychiatric Specialty Exam: Review of Systems  Psychiatric/Behavioral: Positive for decreased concentration, dysphoric mood and sleep disturbance. The patient is nervous/anxious.   All other systems reviewed and are negative.   not currently breastfeeding.There is no height or weight on file to calculate BMI.  General Appearance: Casual  Eye Contact:  Fair  Speech:  Normal Rate  Volume:  Normal  Mood:  Anxious and Depressed  Affect:  Congruent  Thought Process:  Goal Directed and Descriptions of Associations: Intact  Orientation:  Full (Time, Place, and Person)  Thought Content:  Logical  Suicidal Thoughts:  No  Homicidal Thoughts:  No  Memory:  Immediate;   Fair Recent;   Fair Remote;    Fair  Judgement:  Fair  Insight:  Fair  Psychomotor Activity:  Normal  Concentration:  Concentration: Fair and Attention Span: Fair  Recall:  [employer] of Knowledge:Fair  Language: Fair  Akathisia:  No  Handed:  Left  AIMS (if indicated): UTA  Assets:  Communication Skills Desire for [place] Talents/Skills Transportation  ADL's:  Intact  Cognition: WNL  Sleep:  Restless   Screenings: GAD-7   Flowsheet Row Video Visit from 06/29/2020 in [facility] Office Visit from 06/08/2020 in [facility] Office Visit from 05/04/2020 in [facility] Office Visit from 11/18/2019 in [place] [facility] Office Visit from 08/25/2019 in [place]  Total GAD-7 Score 17 11 14 19 18     PHQ2-9   Flowsheet Row Video Visit from 06/29/2020 in [facility] Office Visit from 06/08/2020 in [facility] Office Visit from 05/04/2020 in [facility] Office Visit from 11/18/2019 in [facility] Office Visit from 08/25/2019 in [facility]  PHQ-2 Total Score 4 4 2 4 4   PHQ-9 Total Score 13 14 11 17 19     Flowsheet Row Video Visit from 06/29/2020 in [facility] from 04/17/2020 in [facility] No Risk No Risk      Assessment and Plan: Ileane Sando is a 59 year old.  Her mom, lives in [place], married, has a history of depression, anxiety, PCOS, chronic pain, was evaluated by telemedicine today.  Patient with psychosocial stressors of daughter's health problems, her own health issues, currently struggles with anxiety, depressive symptoms and also with ADHD symptoms and will benefit from the following plan.  Plan Major depressive disorder moderate with mixed features- unstable PHQ-9 today equals 13 Restart Celexa 10 mg p.o. daily Continue Abilify 5 mg p.o. daily Lamictal 200 mg p.o.  daily   GAD-unstable GAD-7 equals 17 Restart Celexa 10 mg p.o. daily Continue hydroxyzine 25-50 mg p.o. daily as needed for severe anxiety attacks Start Klonopin 0.5 mg as needed for severe panic attacks or anxiety attacks-patient advised to use it only a few times a month.  Provided medication education. Reviewed [place] PMP aware.  Insomnia-improving Continue trazodone 50 mg p.o. nightly as needed  High risk medication use-we will order EKG. I have reviewed TSH labs-dated 09/22/2019-within normal limits Reviewed hemoglobin A1c-within normal limits dated 06/08/2020 Reviewed lipid panel-06/08/2020-within normal limits except for triglycerides elevated at 189. Patient will follow up with primary care provider for abnormal labs.  History of ADHD-completed ASRS -patient will be referred for ADHD testing.  We will send a referral to [facility]  Patient will benefit from psychotherapy sessions and this was discussed with patient.  She will reestablish care with her therapist Mr. Morene Crocker.  I have reviewed notes per Dr. Nicolasa Ducking dated February 22, 2020-March 06, 2020-patient with MDD, GAD, insomnia- continued on trazodone 50 mg, Celexa 40 mg, Lamictal 200 mg, Abilify 5 mg tablet.  Follow-up in clinic in 3 to 4 weeks or sooner if needed.  This note was generated in part or whole with voice recognition software. Voice recognition is usually quite accurate but there are transcription errors that can and very often do occur. I apologize for any typographical errors that were not detected and corrected.        Denise Alert, Denise Walker 5/19/20227:51 PM

## 2020-06-30 ENCOUNTER — Other Ambulatory Visit: Payer: Self-pay | Admitting: Obstetrics and Gynecology

## 2020-06-30 ENCOUNTER — Telehealth: Payer: Self-pay

## 2020-06-30 ENCOUNTER — Ambulatory Visit: Payer: 59

## 2020-06-30 ENCOUNTER — Other Ambulatory Visit: Payer: Self-pay

## 2020-06-30 DIAGNOSIS — G8929 Other chronic pain: Secondary | ICD-10-CM | POA: Diagnosis not present

## 2020-06-30 DIAGNOSIS — Z5181 Encounter for therapeutic drug level monitoring: Secondary | ICD-10-CM

## 2020-06-30 DIAGNOSIS — M25511 Pain in right shoulder: Secondary | ICD-10-CM | POA: Diagnosis not present

## 2020-06-30 DIAGNOSIS — M6281 Muscle weakness (generalized): Secondary | ICD-10-CM

## 2020-06-30 DIAGNOSIS — M25512 Pain in left shoulder: Secondary | ICD-10-CM | POA: Diagnosis not present

## 2020-06-30 NOTE — Progress Notes (Signed)
Contacted by patient requesting EKG recommended by psychiatry.

## 2020-06-30 NOTE — Telephone Encounter (Signed)
I called pt to adv of EKG scheduled on 5/24 @ 11:00. Arr at [facility] 15 min prior.

## 2020-06-30 NOTE — Therapy (Signed)
[facility] [address] [place], [place], [address] Phone: [phone]   Fax:  [phone]  Physical Therapy Treatment  Patient Details  Name: Noelani Harbach MRN: [id number] Date of Birth: [birth year] Referring Provider (PT): Dr. Alba Destine   Encounter Date: 06/30/2020   PT End of Session - 06/30/20 1046    Visit Number 4    Number of Visits 17    Date for PT Re-Evaluation 08/08/20    Authorization Type eval: 06/13/20    PT Start Time 0930    PT Stop Time 1015    PT Time Calculation (min) 45 min    Activity Tolerance Patient tolerated treatment well    Behavior During Therapy [facility] for tasks assessed/performed           Past Medical History:  Diagnosis Date  . Anxiety   . Back pain    low back pain with lumbar radiculitis  . Bilateral shoulder pain   . BRCA negative 04/2018   MyRisk neg; IBIS=8.9%/riskscore=6.7%  . Eclampsia   . Family history of ovarian cancer   . Gestational diabetes   . Migraine headache   . PCOS (polycystic ovarian syndrome)     Past Surgical History:  Procedure Laterality Date  . CESAREAN SECTION N/A 07/15/2014   Procedure: CESAREAN SECTION;  Surgeon: Gae Dry, MD;  Location: [facility] ORS;  Service: Obstetrics;  Laterality: N/A;  . CESAREAN SECTION N/A 02/10/2018   Procedure: CESAREAN SECTION;  Surgeon: Will Bonnet, MD;  Location: [facility] ORS;  Service: Obstetrics;  Laterality: N/A;  . CESAREAN SECTION N/A 12/31/2018   Procedure: CESAREAN SECTION;  Surgeon: [name], MD;  Location: [facility] ORS;  Service: Obstetrics;  Laterality: N/A;  . CESAREAN SECTION    . HERNIA REPAIR  10/2013  . WISDOM TOOTH EXTRACTION      There were no vitals filed for this visit.   Subjective Assessment - 06/30/20 1002    Subjective Pt reports that she is doing alright today. She continues with significant L shoulder pain. Rates her shoulder pain as 5/10 upon arrival today. No specific questions upon arrival.     Pertinent History Pt reports that she started with bilateral shoulder pain in 2020 after her 32 year old was born. She has 16 kids (18 years old and younger with the two younger kids having been born 49 months apart). 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 Pt is L hand dominant. She has received steroid injections in both shoulders and had significant improvement in her R shoulder however her L shoulder has remained painful. She describes her shoulder pain as achy and sore. It is worse with lifting, touch, and reaching overhead with her LUE. Improves with rest, heat, and removing her bra. She also has chronic back pain but has had TFESI injections which have helped significantly. She reports numbness and weakness in her LLE but no LUE numbness or pain.    Limitations Lifting;House hold activities    Diagnostic tests Unable to find record of shoulder imaging    Patient Stated Goals Decrease L shoulder pain when lifting daughters out of crib and when putting them into her car    Currently in Pain? Yes    Pain Score 5     Pain Location Shoulder    Pain Orientation Left    Pain Descriptors / Indicators Sore    Pain Type Chronic pain    Pain Onset More than a month  ago    Pain Frequency Constant                TREATMENT   Manual Therapy  Supine L shoulder PROM in all directions; Supine L shoulder A/P mobilization, grade I-II, 30s/bout x 3 bouts; Supine L shoulder distraction mobilizations at different positions through L shoulder abduction, grade I-II, 30s/bout x 3 bouts; Supine L shoulder inferior mobilizations at 90 abduction, grade I-II, 30s/bout x 3 bouts; Supine L shoulder posterior and inferior mobilization at 120 flexion, grade I-II, 30s/bout x 3 bouts; STM to L upper trap, levator scapula, and supraspinatus;   Ther-ex  L shoulder isometrics for flexion, abduction, adduction, extension, ER, and IR, 5s hold x 10 in each  direction;   Pt educated throughout session about proper posture and technique with exercises. Improved exercise technique, movement at target joints, use of target muscles after min to mod verbal, visual, tactile cues.    Continued with manual techniques for L shoulder pain including low grade glenohumeral mobilizations. Also repeated soft tissue mobilization. Pt remains very tender to palpation again today. Deferred trigger point dry needling due to significant continued soreness.  Initiated light isometric strengthening for L shoulder for pain modulation. Pt is making good progress toward her goal of decreased shoulder pain. No HEP progression today. Pt encouraged to follow-up as scheduled. Pt will benefit from PT services to address deficits in L shoulder pain and strength in order to return to full function at home.                             PT Short Term Goals - 06/14/20 1606      PT SHORT TERM GOAL #1   Title Pt will be independent with HEP in order to improve shoulder strength and decrease pain in order to improve pain-free function at home and work.    Time 4    Period Weeks    Status New    Target Date 07/12/20             PT Long Term Goals - 06/14/20 1607      PT LONG TERM GOAL #1   Title Pt will decrease quick DASH score by at least 8% in order to demonstrate clinically significant reduction in disability.    Baseline 06/13/20: Pt to complete at next visit    Time 8    Period Weeks    Status New    Target Date 08/08/20      PT LONG TERM GOAL #2   Title Pt will decrease worst pain as reported on NPRS by at least 3 points in order to demonstrate clinically significant reduction in pain.    Baseline 06/13/20: worst: 8/10;    Time 8    Period Weeks    Status New    Target Date 08/08/20      PT LONG TERM GOAL #3   Title Pt will be able to lift her daughters into her car and pick them up out of the crib while reporting at worst 2/10 L shoulder  pain    Baseline 06/13/20: 8/10 with lifting    Time 8    Period Weeks    Status New    Target Date 08/08/20      PT LONG TERM GOAL #4   Title Pt will report at least 75% improvement in her L shoulder symptoms in order to resume full responsibilities at home with less  pain    Time 8    Period Weeks    Status New    Target Date 08/08/20                 Plan - 06/30/20 1046    Clinical Impression Statement Continued with manual techniques for L shoulder pain including low grade glenohumeral mobilizations. Also repeated soft tissue mobilization. Pt remains very tender to palpation again today. Deferred trigger point dry needling due to significant continued soreness.  Initiated light isometric strengthening for L shoulder for pain modulation. Pt is making good progress toward her goal of decreased shoulder pain. No HEP progression today. Pt encouraged to follow-up as scheduled. Pt will benefit from PT services to address deficits in L shoulder pain and strength in order to return to full function at home.    Personal Factors and Comorbidities Past/Current Experience;Time since onset of injury/illness/exacerbation;Comorbidity 2    Comorbidities Anxiety, migraines    Examination-Activity Limitations Carry;Reach Overhead;Lift;Bathing;Dressing;Caring for Others    Examination-Participation Restrictions Community Activity;Interpersonal Relationship;Meal Prep;Occupation;Shop    Stability/Clinical Decision Making Evolving/Moderate complexity    Rehab Potential Good    PT Frequency 2x / week    PT Duration 8 weeks    PT Treatment/Interventions ADLs/Self Care Home Management;Aquatic Therapy;Biofeedback;Canalith Repostioning;Cryotherapy;Electrical Stimulation;Iontophoresis 91m/ml Dexamethasone;Moist Heat;Traction;Ultrasound;DME Instruction;Gait training;Stair training;Functional mobility training;Therapeutic activities;Therapeutic exercise;Balance training;Neuromuscular re-education;Cognitive  remediation;Patient/family education;Manual techniques;Passive range of motion;Dry needling;Vestibular;Spinal Manipulations;Joint Manipulations    PT Next Visit Plan thoracic and cervical mobs, posture education, L upper quarter STM, TDN, initiate light strengthening/stretching    PT Home Exercise Plan Access Code: [email or web address]   Consulted and Agree with Plan of Care Patient           Patient will benefit from skilled therapeutic intervention in order to improve the following deficits and impairments:  Hypomobility,Pain,Decreased strength  Visit Diagnosis: Chronic left shoulder pain  Muscle weakness (generalized)     Problem List Patient Active Problem List   Diagnosis Date Noted  . Moderate episode of recurrent major depressive disorder ([place] 06/29/2020  . High risk medication use 06/29/2020  . History of ADHD 06/29/2020  . Uterine scar from previous cesarean delivery affecting pregnancy 12/31/2018  . Anti-D antibodies present during pregnancy 010-Jun-2020 . BRCA gene mutation negative 05/04/2018  . History of cesarean delivery 02/10/2018  . Rh negative state in antepartum period 08/23/2017  . History of eclampsia 07/22/2017  . Migraines 04/09/2017  . GAD (generalized anxiety disorder) 12/01/2014   JLyndel SafeHuprich PT, DPT, GCS  Carmie Lanpher 06/30/2020, 10:50 AM  [facility] [place] [address]Phone: [phone]  Fax:  [phone] Name: KShaylin BlattMRN: [id number] of Birth: 9January 03, 1992

## 2020-07-03 NOTE — Patient Instructions (Incomplete)
TREATMENT   Manual Therapy Supine L shoulder PROMin all directions; Supine L shoulderA/P mobilization, grade I-II, 30s/bout x 3 bouts; Supine L shoulderdistraction mobilizations at different positions through L shoulder abduction, grade I-II, 30s/bout x 3 bouts; Supine L shoulderinferior mobilizations at 90 abduction, grade I-II, 30s/bout x 3 bouts; Supine L shoulder posterior and inferior mobilization at 120 flexion, grade I-II, 30s/bout x 3 bouts; STMto L upper trap, levator scapula, and supraspinatus;   Ther-ex  L shoulder isometrics for flexion, abduction, adduction, extension, ER, and IR, 5s hold x 10 in each direction;   Pt educated throughout session about proper posture and technique with exercises. Improved exercise technique, movement at target joints, use of target muscles after min to mod verbal, visual, tactile cues.    Continued with manual techniques for L shoulder pain including low grade glenohumeral mobilizations. Also repeated soft tissue mobilization. Pt remains very tender to palpation again today. Deferred trigger point dry needling due to significant continued soreness.  Initiated light isometric strengthening for L shoulder for pain modulation. Pt is making good progress toward her goal of decreased shoulder pain. No HEP progression today. Pt encouraged to follow-up as scheduled.Pt will benefit from PT services to address deficits inL shoulder pain andstrength in order to return to full function at home.

## 2020-07-04 ENCOUNTER — Other Ambulatory Visit: Payer: Self-pay

## 2020-07-04 ENCOUNTER — Ambulatory Visit: Payer: 59

## 2020-07-04 ENCOUNTER — Ambulatory Visit: Payer: 59 | Attending: Obstetrics and Gynecology

## 2020-07-04 DIAGNOSIS — M6281 Muscle weakness (generalized): Secondary | ICD-10-CM

## 2020-07-04 DIAGNOSIS — M25511 Pain in right shoulder: Secondary | ICD-10-CM | POA: Diagnosis not present

## 2020-07-04 DIAGNOSIS — G8929 Other chronic pain: Secondary | ICD-10-CM | POA: Diagnosis not present

## 2020-07-04 DIAGNOSIS — M25512 Pain in left shoulder: Secondary | ICD-10-CM | POA: Diagnosis not present

## 2020-07-04 NOTE — Therapy (Signed)
[facility] [address] [place], [place], [address] Phone: [phone]   Fax:  [phone]  Physical Therapy Treatment  Patient Details  Name: Denise Walker MRN: [id number] Date of Birth: 1991/01/18 Referring Provider (PT): Dr. Alba Destine   Encounter Date: 07/04/2020   PT End of Session - 07/04/20 0856    Visit Number 5    Number of Visits 17    Date for PT Re-Evaluation 08/08/20    Authorization Type eval: 06/13/20    PT Start Time 0850    PT Stop Time 0930    PT Time Calculation (min) 40 min    Activity Tolerance Patient tolerated treatment well    Behavior During Therapy [facility] for tasks assessed/performed           Past Medical History:  Diagnosis Date  . Anxiety   . Back pain    low back pain with lumbar radiculitis  . Bilateral shoulder pain   . BRCA negative 04/2018   MyRisk neg; IBIS=8.9%/riskscore=6.7%  . Eclampsia   . Family history of ovarian cancer   . Gestational diabetes   . Migraine headache   . PCOS (polycystic ovarian syndrome)     Past Surgical History:  Procedure Laterality Date  . CESAREAN SECTION N/A 07/15/2014   Procedure: CESAREAN SECTION;  Surgeon: Gae Dry, MD;  Location: [facility] OR[facility];  Service: Obstetrics;  Laterality: N/A;  . CESAREAN SECTION N/A 02/10/2018   Procedure: CESAREAN SECTION;  Surgeon: Will Bonnet, MD;  Location: [facility] OR[facility];  Service: Obstetrics;  Laterality: N/A;  . CESAREAN SECTION N/A 12/31/2018   Procedure: CESAREAN SECTION;  Surgeon: [name], MD;  Location: [facility] OR[facility];  Service: Obstetrics;  Laterality: N/A;  . CESAREAN SECTION    . HERNIA REPAIR  10/2013  . WISDOM TOOTH EXTRACTION      There were no vitals filed for this visit.   Subjective Assessment - 07/04/20 0855    Subjective Pt reports that she is doing alright today. She continues with significant L shoulder pain. She was painting in the house yesterday which caused an increase in her pain. Rates her  shoulder pain as 8/10 upon arrival today. No specific questions upon arrival.    Pertinent History Pt reports that she started with bilateral shoulder pain in 2020 after her 32 year old was born. She has 88 kids (58 years old and younger with the two younger kids having been born 30 months apart). 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 Pt is L hand dominant. She has received steroid injections in both shoulders and had significant improvement in her R shoulder however her L shoulder has remained painful. She describes her shoulder pain as achy and sore. It is worse with lifting, touch, and reaching overhead with her LUE. Improves with rest, heat, and removing her bra. She also has chronic back pain but has had TFESI injections which have helped significantly. She reports numbness and weakness in her LLE but no LUE numbness or pain.    Limitations Lifting;House hold activities    Diagnostic tests Unable to find record of shoulder imaging    Patient Stated Goals Decrease L shoulder pain when lifting daughters out of crib and when putting them into her car    Currently in Pain? Yes    Pain Score 8     Pain Location Shoulder    Pain Orientation Left    Pain Descriptors / Indicators Sore  Pain Type Chronic pain    Pain Onset More than a month ago    Pain Frequency Constant              TREATMENT   Manual Therapy Supine L shoulder PROMin all directions; Supine L shoulderA/P mobilization, grade I-II, 30s/bout x 3 bouts; Supine L shoulderdistraction mobilizations at different positions through L shoulder abduction, grade I-II, 30s/bout x 3 bouts; Supine L shoulderinferior mobilizations at 90 abduction, grade I-II, 30s/bout x 3 bouts; Supine L shoulder posterior and inferior mobilization at 120 flexion, grade I-II, 30s/bout x 3 bouts; STMto L upper trap, levator scapula, and supraspinatus;   Ther-ex  L serratus punch x 10; L shoulder  rhythmic stabilization at 90 flexion x 30s; L shoulder isometrics for flexion, abduction, adduction, extension, ER, and IR, 5s hold x 10 in each direction;   Pt educated throughout session about proper posture and technique with exercises. Improved exercise technique, movement at target joints, use of target muscles after min to mod verbal, visual, tactile cues.    Continued with manual techniques for L shoulder pain including low grade glenohumeral mobilizations. Also repeated soft tissue mobilization. Progressed light R shoulder strengthening and repeated isometrics. Pt is making good progress toward her goal of decreased shoulder pain. No HEP progression today. Pt encouraged to follow-up as scheduled.Pt will benefit from PT services to address deficits inL shoulder pain andstrength in order to return to full function at home.                          PT Short Term Goals - 06/14/20 1606      PT SHORT TERM GOAL #1   Title Pt will be independent with HEP in order to improve shoulder strength and decrease pain in order to improve pain-free function at home and work.    Time 4    Period Weeks    Status New    Target Date 07/12/20             PT Long Term Goals - 06/14/20 1607      PT LONG TERM GOAL #1   Title Pt will decrease quick DASH score by at least 8% in order to demonstrate clinically significant reduction in disability.    Baseline 06/13/20: Pt to complete at next visit    Time 8    Period Weeks    Status New    Target Date 08/08/20      PT LONG TERM GOAL #2   Title Pt will decrease worst pain as reported on NPRS by at least 3 points in order to demonstrate clinically significant reduction in pain.    Baseline 06/13/20: worst: 8/10;    Time 8    Period Weeks    Status New    Target Date 08/08/20      PT LONG TERM GOAL #3   Title Pt will be able to lift her daughters into her car and pick them up out of the crib while reporting at worst 2/10  L shoulder pain    Baseline 06/13/20: 8/10 with lifting    Time 8    Period Weeks    Status New    Target Date 08/08/20      PT LONG TERM GOAL #4   Title Pt will report at least 75% improvement in her L shoulder symptoms in order to resume full responsibilities at home with less pain    Time 8  Period Weeks    Status New    Target Date 08/08/20                 Plan - 07/04/20 0900    Clinical Impression Statement Continued with manual techniques for L shoulder pain including low grade glenohumeral mobilizations. Also repeated soft tissue mobilization. Progressed light R shoulder strengthening and repeated isometrics. Pt is making good progress toward her goal of decreased shoulder pain. No HEP progression today. Pt encouraged to follow-up as scheduled. Pt will benefit from PT services to address deficits in L shoulder pain and strength in order to return to full function at home.    Personal Factors and Comorbidities Past/Current Experience;Time since onset of injury/illness/exacerbation;Comorbidity 2    Comorbidities Anxiety, migraines    Examination-Activity Limitations Carry;Reach Overhead;Lift;Bathing;Dressing;Caring for Others    Examination-Participation Restrictions Community Activity;Interpersonal Relationship;Meal Prep;Occupation;Shop    Stability/Clinical Decision Making Evolving/Moderate complexity    Rehab Potential Good    PT Frequency 2x / week    PT Duration 8 weeks    PT Treatment/Interventions ADLs/Self Care Home Management;Aquatic Therapy;Biofeedback;Canalith Repostioning;Cryotherapy;Electrical Stimulation;Iontophoresis 46m/ml Dexamethasone;Moist Heat;Traction;Ultrasound;DME Instruction;Gait training;Stair training;Functional mobility training;Therapeutic activities;Therapeutic exercise;Balance training;Neuromuscular re-education;Cognitive remediation;Patient/family education;Manual techniques;Passive range of motion;Dry needling;Vestibular;Spinal  Manipulations;Joint Manipulations    PT Next Visit Plan thoracic and cervical mobs, posture education, L upper quarter STM, TDN, initiate light strengthening/stretching    PT Home Exercise Plan Access Code: [email or web address]   Consulted and Agree with Plan of Care Patient           Patient will benefit from skilled therapeutic intervention in order to improve the following deficits and impairments:  Hypomobility,Pain,Decreased strength  Visit Diagnosis: Chronic left shoulder pain  Muscle weakness (generalized)     Problem List Patient Active Problem List   Diagnosis Date Noted  . Moderate episode of recurrent major depressive disorder ([place] 06/29/2020  . High risk medication use 06/29/2020  . History of ADHD 06/29/2020  . Uterine scar from previous cesarean delivery affecting pregnancy 12/31/2018  . Anti-D antibodies present during pregnancy 005/30/2020 . BRCA gene mutation negative 05/04/2018  . History of cesarean delivery 02/10/2018  . Rh negative state in antepartum period 08/23/2017  . History of eclampsia 07/22/2017  . Migraines 04/09/2017  . GAD (generalized anxiety disorder) 12/01/2014   JPhillips GroutPT, DPT, GCS  Kaislee Chao 07/04/2020, 12:35 PM  [facility] [place] [address]Phone: [phone]  Fax:  [phone] Name: KDiamond JentzMRN: [id number] of Birth: 9Sep 12, 1992

## 2020-07-05 NOTE — Patient Instructions (Signed)
TREATMENT   Manual Therapy Supine L shoulder PROMin all directions; Supine L shoulderA/P mobilization, grade I-II, 30s/bout x 3 bouts; Supine L shoulderdistraction mobilizations at different positions through L shoulder abduction, grade I-II, 30s/bout x 3 bouts; Supine L shoulderinferior mobilizations at 90 abduction, grade I-II, 30s/bout x 3 bouts; Supine L shoulder posterior and inferior mobilization at 120 flexion, grade I-II, 30s/bout x 3 bouts; STMto L upper trap, levator scapula, and supraspinatus;   Ther-ex L serratus punch x 10; L shoulder rhythmic stabilization at 90 flexion x 30s; L shoulder isometrics for flexion, abduction, adduction, extension, ER, and IR, 5s hold x 10 in each direction;   Pt educated throughout session about proper posture and technique with exercises. Improved exercise technique, movement at target joints, use of target muscles after min to mod verbal, visual, tactile cues.   Continued withmanual techniques for L shoulder pain including low grade glenohumeral mobilizations. Also repeated soft tissue mobilization. Progressed light R shoulder strengthening and repeated isometrics.Pt is making good progress toward her goal of decreased shoulder pain. No HEP progression today. Pt encouraged to follow-up as scheduled.Pt will benefit from PT services to address deficits inL shoulder pain andstrength in order to return to full function at home.

## 2020-07-06 ENCOUNTER — Ambulatory Visit: Payer: 59

## 2020-07-07 ENCOUNTER — Other Ambulatory Visit: Payer: Self-pay

## 2020-07-07 ENCOUNTER — Ambulatory Visit: Payer: 59

## 2020-07-07 DIAGNOSIS — M25512 Pain in left shoulder: Secondary | ICD-10-CM | POA: Diagnosis not present

## 2020-07-07 DIAGNOSIS — G8929 Other chronic pain: Secondary | ICD-10-CM | POA: Diagnosis not present

## 2020-07-07 DIAGNOSIS — M6281 Muscle weakness (generalized): Secondary | ICD-10-CM

## 2020-07-07 DIAGNOSIS — M25511 Pain in right shoulder: Secondary | ICD-10-CM | POA: Diagnosis not present

## 2020-07-07 NOTE — Therapy (Signed)
[facility] [address]. [place], [place], [address] Phone: [phone]   Fax:  [phone]  Physical Therapy Treatment  Patient Details  Name: Denise Walker MRN: [id number] Date of Birth: 23-Dec-1990 Referring Provider (PT): Dr. Alba Destine   Encounter Date: 07/07/2020   PT End of Session - 07/07/20 2111    Visit Number 6    Number of Visits 17    Date for PT Re-Evaluation 08/08/20    Authorization Type eval: 06/13/20    PT Start Time 1145    PT Stop Time 1227    PT Time Calculation (min) 42 min    Activity Tolerance Patient tolerated treatment well    Behavior During Therapy [facility] for tasks assessed/performed           Past Medical History:  Diagnosis Date  . Anxiety   . Back pain    low back pain with lumbar radiculitis  . Bilateral shoulder pain   . BRCA negative 04/2018   MyRisk neg; IBIS=8.9%/riskscore=6.7%  . Eclampsia   . Family history of ovarian cancer   . Gestational diabetes   . Migraine headache   . PCOS (polycystic ovarian syndrome)     Past Surgical History:  Procedure Laterality Date  . CESAREAN SECTION N/A 07/15/2014   Procedure: CESAREAN SECTION;  Surgeon: Gae Dry, MD;  Location: [facility] ORS;  Service: Obstetrics;  Laterality: N/A;  . CESAREAN SECTION N/A 02/10/2018   Procedure: CESAREAN SECTION;  Surgeon: Will Bonnet, MD;  Location: [facility] ORS;  Service: Obstetrics;  Laterality: N/A;  . CESAREAN SECTION N/A 12/31/2018   Procedure: CESAREAN SECTION;  Surgeon: [name], MD;  Location: [facility] ORS;  Service: Obstetrics;  Laterality: N/A;  . CESAREAN SECTION    . HERNIA REPAIR  10/2013  . WISDOM TOOTH EXTRACTION      There were no vitals filed for this visit.   Subjective Assessment - 07/07/20 2109    Subjective Pt reports that she has been feeling very anxious this morning. She continues with significant L shoulder pain. She has continued painting at home which increase her pain. Rates her  shoulder pain as 7/10 upon arrival today. No specific questions upon arrival.    Pertinent History Pt reports that she started with bilateral shoulder pain in 2020 after her 32 year old was born. She has 58 kids (50 years old and younger with the two younger kids having been born 48 months apart). 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 Pt is L hand dominant. She has received steroid injections in both shoulders and had significant improvement in her R shoulder however her L shoulder has remained painful. She describes her shoulder pain as achy and sore. It is worse with lifting, touch, and reaching overhead with her LUE. Improves with rest, heat, and removing her bra. She also has chronic back pain but has had TFESI injections which have helped significantly. She reports numbness and weakness in her LLE but no LUE numbness or pain.    Limitations Lifting;House hold activities    Diagnostic tests Unable to find record of shoulder imaging    Patient Stated Goals Decrease L shoulder pain when lifting daughters out of crib and when putting them into her car    Currently in Pain? Yes    Pain Score 7     Pain Location Shoulder    Pain Orientation Left    Pain Descriptors / Indicators Sore  Pain Type Chronic pain    Pain Onset More than a month ago    Pain Frequency Constant                TREATMENT   Manual Therapy Supine L shoulder PROMin all directions; Supine L shoulderA/P mobilization, grade I-II, 30s/bout x 3 bouts; Supine L shoulderdistraction mobilizations at different positions through L shoulder abduction, grade I-II, 30s/bout x 3 bouts; Supine L shoulderinferior mobilizations at 90 abduction, grade I-II, 30s/bout x 3 bouts; Supine L shoulder posterior and inferior mobilization at 120 flexion, grade I-II, 30s/bout x 3 bouts; STMto L upper trap, levator scapula, and supraspinatus;   Ther-ex  L shoulder isometrics for  flexion, abduction, adduction, extension, ER, and IR, 5s hold x 10 in each direction;   Pt educated throughout session about proper posture and technique with exercises. Improved exercise technique, movement at target joints, use of target muscles after min to mod verbal, visual, tactile cues.    Continued with manual techniques for L shoulder pain including low grade glenohumeral mobilizations. Also repeated soft tissue mobilization. Progressed light R shoulder strengthening and repeated isometrics. Pt is making good progress toward her goal of decreased shoulder pain however due to continual use continues to aggravate her shoulder. No HEP progression today. Pt encouraged to follow-up as scheduled.Pt will benefit from PT services to address deficits inL shoulder pain andstrength in order to return to full function at home.                                  PT Short Term Goals - 06/14/20 1606      PT SHORT TERM GOAL #1   Title Pt will be independent with HEP in order to improve shoulder strength and decrease pain in order to improve pain-free function at home and work.    Time 4    Period Weeks    Status New    Target Date 07/12/20             PT Long Term Goals - 06/14/20 1607      PT LONG TERM GOAL #1   Title Pt will decrease quick DASH score by at least 8% in order to demonstrate clinically significant reduction in disability.    Baseline 06/13/20: Pt to complete at next visit    Time 8    Period Weeks    Status New    Target Date 08/08/20      PT LONG TERM GOAL #2   Title Pt will decrease worst pain as reported on NPRS by at least 3 points in order to demonstrate clinically significant reduction in pain.    Baseline 06/13/20: worst: 8/10;    Time 8    Period Weeks    Status New    Target Date 08/08/20      PT LONG TERM GOAL #3   Title Pt will be able to lift her daughters into her car and pick them up out of the crib while reporting at  worst 2/10 L shoulder pain    Baseline 06/13/20: 8/10 with lifting    Time 8    Period Weeks    Status New    Target Date 08/08/20      PT LONG TERM GOAL #4   Title Pt will report at least 75% improvement in her L shoulder symptoms in order to resume full responsibilities at home with less pain  Time 8    Period Weeks    Status New    Target Date 08/08/20                 Plan - 07/07/20 2111    Clinical Impression Statement Continued with manual techniques for L shoulder pain including low grade glenohumeral mobilizations. Also repeated soft tissue mobilization. Progressed light R shoulder strengthening and repeated isometrics. Pt is making good progress toward her goal of decreased shoulder pain however due to continual use continues to aggravate her shoulder. No HEP progression today. Pt encouraged to follow-up as scheduled. Pt will benefit from PT services to address deficits in L shoulder pain and strength in order to return to full function at home.    Personal Factors and Comorbidities Past/Current Experience;Time since onset of injury/illness/exacerbation;Comorbidity 2    Comorbidities Anxiety, migraines    Examination-Activity Limitations Carry;Reach Overhead;Lift;Bathing;Dressing;Caring for Others    Examination-Participation Restrictions Community Activity;Interpersonal Relationship;Meal Prep;Occupation;Shop    Stability/Clinical Decision Making Evolving/Moderate complexity    Rehab Potential Good    PT Frequency 2x / week    PT Duration 8 weeks    PT Treatment/Interventions ADLs/Self Care Home Management;Aquatic Therapy;Biofeedback;Canalith Repostioning;Cryotherapy;Electrical Stimulation;Iontophoresis 71m/ml Dexamethasone;Moist Heat;Traction;Ultrasound;DME Instruction;Gait training;Stair training;Functional mobility training;Therapeutic activities;Therapeutic exercise;Balance training;Neuromuscular re-education;Cognitive remediation;Patient/family education;Manual  techniques;Passive range of motion;Dry needling;Vestibular;Spinal Manipulations;Joint Manipulations    PT Next Visit Plan thoracic and cervical mobs, posture education, L upper quarter STM, TDN, initiate light strengthening/stretching    PT Home Exercise Plan Access Code: [email or web address]   Consulted and Agree with Plan of Care Patient           Patient will benefit from skilled therapeutic intervention in order to improve the following deficits and impairments:  Hypomobility,Pain,Decreased strength  Visit Diagnosis: Chronic left shoulder pain  Muscle weakness (generalized)     Problem List Patient Active Problem List   Diagnosis Date Noted  . Moderate episode of recurrent major depressive disorder ([place] 06/29/2020  . High risk medication use 06/29/2020  . History of ADHD 06/29/2020  . Uterine scar from previous cesarean delivery affecting pregnancy 12/31/2018  . Anti-D antibodies present during pregnancy 014-Jun-2020 . BRCA gene mutation negative 05/04/2018  . History of cesarean delivery 02/10/2018  . Rh negative state in antepartum period 08/23/2017  . History of eclampsia 07/22/2017  . Migraines 04/09/2017  . GAD (generalized anxiety disorder) 12/01/2014   JPhillips GroutPT, DPT, GCS  Saanvi Hakala 07/07/2020, 9:14 PM  [place] [facility] [place] [address]Phone: [phone]  Fax:  [phone] Name: KIvionna VerleyMRN: [id number] of Birth: [birth year]

## 2020-07-13 ENCOUNTER — Ambulatory Visit: Payer: 59 | Attending: Physical Medicine & Rehabilitation

## 2020-07-13 ENCOUNTER — Telehealth: Payer: Self-pay

## 2020-07-13 ENCOUNTER — Other Ambulatory Visit: Payer: Self-pay

## 2020-07-13 DIAGNOSIS — M25512 Pain in left shoulder: Secondary | ICD-10-CM | POA: Insufficient documentation

## 2020-07-13 DIAGNOSIS — M6281 Muscle weakness (generalized): Secondary | ICD-10-CM | POA: Diagnosis not present

## 2020-07-13 DIAGNOSIS — G8929 Other chronic pain: Secondary | ICD-10-CM

## 2020-07-13 DIAGNOSIS — M25511 Pain in right shoulder: Secondary | ICD-10-CM | POA: Diagnosis not present

## 2020-07-13 NOTE — Therapy (Signed)
[facility] [address] [place], [place], [address] Phone: [phone]   Fax:  [phone]  Physical Therapy Treatment  Patient Details  Name: Denise Walker MRN: [id number] Date of Birth: [birth year] Referring Provider (PT): Dr. Alba Destine   Encounter Date: 07/13/2020   PT End of Session - 07/13/20 1652    Visit Number 7    Number of Visits 17    Date for PT Re-Evaluation 08/08/20    Authorization Type eval: 06/13/20    PT Start Time 1018    PT Stop Time 1100    PT Time Calculation (min) 42 min    Activity Tolerance Patient tolerated treatment well    Behavior During Therapy [facility] for tasks assessed/performed           Past Medical History:  Diagnosis Date  . Anxiety   . Back pain    low back pain with lumbar radiculitis  . Bilateral shoulder pain   . BRCA negative 04/2018   MyRisk neg; IBIS=8.9%/riskscore=6.7%  . Eclampsia   . Family history of ovarian cancer   . Gestational diabetes   . Migraine headache   . PCOS (polycystic ovarian syndrome)     Past Surgical History:  Procedure Laterality Date  . CESAREAN SECTION N/A 07/15/2014   Procedure: CESAREAN SECTION;  Surgeon: Gae Dry, MD;  Location: [facility] ORS;  Service: Obstetrics;  Laterality: N/A;  . CESAREAN SECTION N/A 02/10/2018   Procedure: CESAREAN SECTION;  Surgeon: Will Bonnet, MD;  Location: [facility] ORS;  Service: Obstetrics;  Laterality: N/A;  . CESAREAN SECTION N/A 12/31/2018   Procedure: CESAREAN SECTION;  Surgeon: [name], MD;  Location: [facility] ORS;  Service: Obstetrics;  Laterality: N/A;  . CESAREAN SECTION    . HERNIA REPAIR  10/2013  . WISDOM TOOTH EXTRACTION      There were no vitals filed for this visit.   Subjective Assessment - 07/13/20 1650    Subjective Pt reports that she has continued feeling anxious and has an appointment to see her psychiatrist tomorrow.. She continues with significant L shoulder pain but does not rate upon  arrival today. It has improved some as she has not been painting as much at home. No specific questions upon arrival.    Pertinent History Pt reports that she started with bilateral shoulder pain in 2020 after her 32 year old was born. She has 73 kids (79 years old and younger with the two younger kids having been born 64 months apart). 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 Pt is L hand dominant. She has received steroid injections in both shoulders and had significant improvement in her R shoulder however her L shoulder has remained painful. She describes her shoulder pain as achy and sore. It is worse with lifting, touch, and reaching overhead with her LUE. Improves with rest, heat, and removing her bra. She also has chronic back pain but has had TFESI injections which have helped significantly. She reports numbness and weakness in her LLE but no LUE numbness or pain.    Limitations Lifting;House hold activities    Diagnostic tests Unable to find record of shoulder imaging    Patient Stated Goals Decrease L shoulder pain when lifting daughters out of crib and when putting them into her car    Currently in Pain? Yes    Pain Score --   Does not rate   Pain Location Shoulder    Pain Orientation Left  Pain Descriptors / Indicators Sore    Pain Type Chronic pain    Pain Onset More than a month ago    Pain Frequency Constant              TREATMENT   Manual Therapy Supine L shoulder PROMin all directions; Supine L shoulderA/P mobilization, grade I-II, 30s/bout x 3 bouts; Supine L shoulderdistraction mobilizations at different positions through L shoulder abduction, grade I-II, 30s/bout x 3 bouts; Supine L shoulderinferior mobilizations at 90 abduction, grade I-II, 30s/bout x 3 bouts; Supine L shoulder posterior and inferior mobilization at 120 flexion, grade I-II, 30s/bout x 3 bouts; STMto L upper trap, levator scapula, and  supraspinatus;   Ther-ex  Supine L shoulder isometrics for flexion, abduction, adduction, extension, ER, and IR, 5s hold x 10 in each direction; Supine L shoulder serratus punch with manual resistance x 10; Supine left shoulder rhythmic stabilization at 90 degrees of flexion and resistance provided at wrist x30 seconds   Pt educated throughout session about proper posture and technique with exercises. Improved exercise technique, movement at target joints, use of target muscles after min to mod verbal, visual, tactile cues.    Continued with manual techniques for L shoulder pain including low grade glenohumeral mobilizations. Also repeated soft tissue mobilization. Progressed light R shoulder strengthening and repeated isometrics. Pt is making good progress toward her goal of decreased shoulder pain and with more ability to rest over the last week she is reporting improvement in her pain.  No HEP progression today. Pt encouraged to follow-up as scheduled.Pt will benefit from PT services to address deficits inL shoulder pain andstrength in order to return to full function at home.                                     PT Short Term Goals - 06/14/20 1606      PT SHORT TERM GOAL #1   Title Pt will be independent with HEP in order to improve shoulder strength and decrease pain in order to improve pain-free function at home and work.    Time 4    Period Weeks    Status New    Target Date 07/12/20             PT Long Term Goals - 06/14/20 1607      PT LONG TERM GOAL #1   Title Pt will decrease quick DASH score by at least 8% in order to demonstrate clinically significant reduction in disability.    Baseline 06/13/20: Pt to complete at next visit    Time 8    Period Weeks    Status New    Target Date 08/08/20      PT LONG TERM GOAL #2   Title Pt will decrease worst pain as reported on NPRS by at least 3 points in order to demonstrate clinically  significant reduction in pain.    Baseline 06/13/20: worst: 8/10;    Time 8    Period Weeks    Status New    Target Date 08/08/20      PT LONG TERM GOAL #3   Title Pt will be able to lift her daughters into her car and pick them up out of the crib while reporting at worst 2/10 L shoulder pain    Baseline 06/13/20: 8/10 with lifting    Time 8    Period Weeks  Status New    Target Date 08/08/20      PT LONG TERM GOAL #4   Title Pt will report at least 75% improvement in her L shoulder symptoms in order to resume full responsibilities at home with less pain    Time 8    Period Weeks    Status New    Target Date 08/08/20                 Plan - 07/13/20 1652    Clinical Impression Statement Continued with manual techniques for L shoulder pain including low grade glenohumeral mobilizations. Also repeated soft tissue mobilization. Progressed light R shoulder strengthening and repeated isometrics. Pt is making good progress toward her goal of decreased shoulder pain and with more ability to rest over the last week she is reporting improvement in her pain.  No HEP progression today. Pt encouraged to follow-up as scheduled. Pt will benefit from PT services to address deficits in L shoulder pain and strength in order to return to full function at home.    Personal Factors and Comorbidities Past/Current Experience;Time since onset of injury/illness/exacerbation;Comorbidity 2    Comorbidities Anxiety, migraines    Examination-Activity Limitations Carry;Reach Overhead;Lift;Bathing;Dressing;Caring for Others    Examination-Participation Restrictions Community Activity;Interpersonal Relationship;Meal Prep;Occupation;Shop    Stability/Clinical Decision Making Evolving/Moderate complexity    Rehab Potential Good    PT Frequency 2x / week    PT Duration 8 weeks    PT Treatment/Interventions ADLs/Self Care Home Management;Aquatic Therapy;Biofeedback;Canalith Repostioning;Cryotherapy;Electrical  Stimulation;Iontophoresis 84m/ml Dexamethasone;Moist Heat;Traction;Ultrasound;DME Instruction;Gait training;Stair training;Functional mobility training;Therapeutic activities;Therapeutic exercise;Balance training;Neuromuscular re-education;Cognitive remediation;Patient/family education;Manual techniques;Passive range of motion;Dry needling;Vestibular;Spinal Manipulations;Joint Manipulations    PT Next Visit Plan thoracic and cervical mobs, posture education, L upper quarter STM, TDN, initiate light strengthening/stretching    PT Home Exercise Plan Access Code: [email or web address]   Consulted and Agree with Plan of Care Patient           Patient will benefit from skilled therapeutic intervention in order to improve the following deficits and impairments:  Hypomobility,Pain,Decreased strength  Visit Diagnosis: Chronic left shoulder pain  Muscle weakness (generalized)     Problem List Patient Active Problem List   Diagnosis Date Noted  . Moderate episode of recurrent major depressive disorder ([place] 06/29/2020  . High risk medication use 06/29/2020  . History of ADHD 06/29/2020  . Uterine scar from previous cesarean delivery affecting pregnancy 12/31/2018  . Anti-D antibodies present during pregnancy 006-10-2018 . BRCA gene mutation negative 05/04/2018  . History of cesarean delivery 02/10/2018  . Rh negative state in antepartum period 08/23/2017  . History of eclampsia 07/22/2017  . Migraines 04/09/2017  . GAD (generalized anxiety disorder) 12/01/2014   JLyndel SafeHuprich PT, DPT, GCS  Mihira Tozzi 07/13/2020, 4:56 PM  [place] [facility] [place] [address]Phone: [phone]  Fax:  [phone] Name: Denise OmahoneyMRN: [id number] of Birth: [birth year]

## 2020-07-13 NOTE — Telephone Encounter (Signed)
Please squeeze her in at 10:40 tomorrow

## 2020-07-13 NOTE — Telephone Encounter (Signed)
pt called states that her depression is worse she was wanting to see you

## 2020-07-14 ENCOUNTER — Telehealth (INDEPENDENT_AMBULATORY_CARE_PROVIDER_SITE_OTHER): Payer: 59 | Admitting: Psychiatry

## 2020-07-14 ENCOUNTER — Encounter: Payer: Self-pay | Admitting: Psychiatry

## 2020-07-14 DIAGNOSIS — Z79899 Other long term (current) drug therapy: Secondary | ICD-10-CM | POA: Diagnosis not present

## 2020-07-14 DIAGNOSIS — F411 Generalized anxiety disorder: Secondary | ICD-10-CM | POA: Diagnosis not present

## 2020-07-14 DIAGNOSIS — Z8659 Personal history of other mental and behavioral disorders: Secondary | ICD-10-CM

## 2020-07-14 DIAGNOSIS — F331 Major depressive disorder, recurrent, moderate: Secondary | ICD-10-CM | POA: Diagnosis not present

## 2020-07-14 MED ORDER — ARIPIPRAZOLE 10 MG PO TABS: 10.0000 mg | ORAL_TABLET | Freq: Every day | ORAL | 1 refills | Status: DC

## 2020-07-14 NOTE — Progress Notes (Signed)
Virtual Visit via Video Note  I connected with Denise Walker on 07/14/20 at 10:40 AM EDT by a video enabled telemedicine application and verified that I am speaking with the correct person using two identifiers.  Location Provider Location : [facility] Patient Location : Home  Participants: Patient , Provider   I discussed the limitations of evaluation and management by telemedicine and the availability of in person appointments. The patient expressed understanding and agreed to proceed.    I discussed the assessment and treatment plan with the patient. The patient was provided an opportunity to ask questions and all were answered. The patient agreed with the plan and demonstrated an understanding of the instructions.   The patient was advised to call back or seek an in-person evaluation if the symptoms worsen or if the condition fails to improve as anticipated.  Denise Walker OP Progress Note  07/14/2020 12:37 PM Denise Walker  MRN:  [id number]  Chief Complaint:  Chief Complaint    Follow-up; Depression; Anxiety     HPI: Denise Walker is a 30 year old married Caucasian female, stay-at-home mother, lives in [place], has a history of MDD, GAD, history of ADHD was evaluated by telemedicine today.  Patient called the clinic reporting worsening depressive symptoms and this appointment was scheduled to evaluate patient.  Patient today reports since the past few days she has been feeling more depressed.  She does not have any significant triggers and reports everything is going okay at this time.  She reports she started taking the Celexa and she does not think the Celexa is making her mood worse and she has been on it in the past and it gave her any side effects.  Patient reports she also has not been able to find a therapist yet.  She is motivated to start therapy.  Patient is compliant on her medications as prescribed.  Patient denies any suicidality, homicidality or perceptual  disturbances.  Patient denies any other concerns today.  Visit Diagnosis:    ICD-10-CM   1. Moderate episode of recurrent major depressive disorder (HCC)  F33.1 ARIPiprazole (ABILIFY) 10 MG tablet   mixed features  2. GAD (generalized anxiety disorder)  F41.1   3. High risk medication use  Z79.899   4. History of ADHD  Z86.59     Past Psychiatric History: I have reviewed past psychiatric history from progress note on 06/29/2020.  Past trials of Wellbutrin, Lexapro, Zoloft, Celexa, Trintellix, Concerta, Ritalin  Past Medical History:  Past Medical History:  Diagnosis Date  . Anxiety   . Back pain    low back pain with lumbar radiculitis  . Bilateral shoulder pain   . BRCA negative 04/2018   MyRisk neg; IBIS=8.9%/riskscore=6.7%  . Eclampsia   . Family history of ovarian cancer   . Gestational diabetes   . Migraine headache   . PCOS (polycystic ovarian syndrome)     Past Surgical History:  Procedure Laterality Date  . CESAREAN SECTION N/A 07/15/2014   Procedure: CESAREAN SECTION;  Surgeon: Gae Dry, Walker;  Location: [facility] ORS;  Service: Obstetrics;  Laterality: N/A;  . CESAREAN SECTION N/A 02/10/2018   Procedure: CESAREAN SECTION;  Surgeon: Will Bonnet, Walker;  Location: [facility] ORS;  Service: Obstetrics;  Laterality: N/A;  . CESAREAN SECTION N/A 12/31/2018   Procedure: CESAREAN SECTION;  Surgeon: [name], Walker;  Location: [facility] ORS;  Service: Obstetrics;  Laterality: N/A;  . CESAREAN SECTION    . HERNIA REPAIR  10/2013  . WISDOM TOOTH EXTRACTION  Family Psychiatric History: I have reviewed family psychiatric history from progress note on 06/29/2020  Family History:  Family History  Problem Relation Age of Onset  . Ovarian cancer Mother   . Arthritis Mother   . Diabetes Paternal Grandfather   . Diabetes Paternal Aunt   . Ovarian cancer Maternal Aunt   . Breast cancer Maternal Grandmother   . Uterine cancer Cousin   . ADD / ADHD Daughter   . ODD  Daughter     Social History: Reviewed social history from progress note on 06/29/2020 Social History   Socioeconomic History  . Marital status: Married    Spouse name: Einar Pheasant  . Number of children: 3  . Years of education: high school  . Highest education level: Not on file  Occupational History  . Not on file  Tobacco Use  . Smoking status: Never Smoker  . Smokeless tobacco: Never Used  Vaping Use  . Vaping Use: Never used  Substance and Sexual Activity  . Alcohol use: Yes    Comment: occasional wine cooler  . Drug use: Never  . Sexual activity: Yes    Birth control/protection: I.U.D.    Comment: will discuss with Walker  Other Topics Concern  . Not on file  Social History Narrative  . Not on file   Social Determinants of Health   Financial Resource Strain: Not on file  Food Insecurity: Not on file  Transportation Needs: Not on file  Physical Activity: Not on file  Stress: Not on file  Social Connections: Not on file    Allergies:  Allergies  Allergen Reactions  . Fish Allergy Shortness Of Breath  . Shellfish Allergy Shortness Of Breath    Metabolic Disorder Labs: Lab Results  Component Value Date   HGBA1C 5.5 06/08/2020   Lab Results  Component Value Date   PROLACTIN 8.8 03/27/2017   Lab Results  Component Value Date   CHOL 175 06/08/2020   TRIG 189 (H) 06/08/2020   HDL 47 06/08/2020   CHOLHDL 3.7 06/08/2020   LDLCALC 96 06/08/2020   Lab Results  Component Value Date   TSH 4.350 09/22/2019   TSH 2.170 03/27/2017    Therapeutic Level Labs: No results found for: LITHIUM No results found for: VALPROATE No components found for:  CBMZ  Current Medications: Current Outpatient Medications  Medication Sig Dispense Refill  . ARIPiprazole (ABILIFY) 10 MG tablet Take 1 tablet (10 mg total) by mouth daily. 30 tablet 1  . Butalbital-APAP-Caffeine 50-325-40 MG capsule Take by mouth.    . citalopram (CELEXA) 10 MG tablet Take 1 tablet (10 mg total) by  mouth daily. 30 tablet 1  . clonazePAM (KLONOPIN) 0.5 MG tablet Take 1 tablet (0.5 mg total) by mouth as directed. Take 1 tablet 1-2 times a week for severe anxiety attacks only 8 tablet 0  . cyclobenzaprine (FLEXERIL) 10 MG tablet Take 1 tablet by mouth as needed.    . hydrOXYzine (ATARAX/VISTARIL) 25 MG tablet Take 1-2 tablets (25-50 mg total) by mouth daily as needed for anxiety. 60 tablet 1  . lamoTRIgine (LAMICTAL) 200 MG tablet Take 1 tablet (200 mg total) by mouth daily. 90 tablet 0  . levonorgestrel (MIRENA) 20 MCG/24HR IUD by Intrauterine route.    . traZODone (DESYREL) 50 MG tablet      No current facility-administered medications for this visit.     Musculoskeletal: Strength & Muscle Tone: UTA Gait & Station: UTA Patient leans: N/A  Psychiatric Specialty Exam: Review of Systems  Psychiatric/Behavioral: Positive for decreased concentration and dysphoric mood. The patient is nervous/anxious.   All other systems reviewed and are negative.   not currently breastfeeding.There is no height or weight on file to calculate BMI.  General Appearance: Casual  Eye Contact:  Fair  Speech:  Clear and Coherent  Volume:  Normal  Mood:  Anxious and Depressed  Affect:  Congruent  Thought Process:  Goal Directed and Descriptions of Associations: Intact  Orientation:  Full (Time, Place, and Person)  Thought Content: Logical   Suicidal Thoughts:  No  Homicidal Thoughts:  No  Memory:  Immediate;   Fair Recent;   Fair Remote;   Fair  Judgement:  Fair  Insight:  Fair  Psychomotor Activity:  Normal  Concentration:  Concentration: Fair and Attention Span: Fair  Recall:  [employer] of Knowledge: Fair  Language: Fair  Akathisia:  No  Handed:  Right  AIMS (if indicated): UTA  Assets:  Communication Skills Desire for Improvement Housing Social Support  ADL's:  Intact  Cognition: WNL  Sleep:  Fair   Screenings: GAD-7   Flowsheet Row Video Visit from 06/29/2020 in [facility] Office Visit from 06/08/2020 in [facility] Office Visit from 05/04/2020 in [facility] Office Visit from 11/18/2019 in [facility] Office Visit from 08/25/2019 in [facility]  Total GAD-7 Score 17 11 14 19 18     PHQ2-9   Flowsheet Row Video Visit from 07/14/2020 in [facility] Video Visit from 06/29/2020 in [facility] Office Visit from 06/08/2020 in [facility] Office Visit from 05/04/2020 in [facility] Office Visit from 11/18/2019 in [facility]  PHQ-2 Total Score 6 4 4 2 4   PHQ-9 Total Score 20 13 14 11 17     Flowsheet Row Video Visit from 06/29/2020 in [facility] from 04/17/2020 in [facility] No Risk No Risk       Assessment and Plan: Keyanah Kozicki is a 30 year old Caucasian female, lives in [place], married, has a history of depression, anxiety, PCOS, chronic pain was evaluated by telemedicine today.  Patient is currently struggling with worsening depressive symptoms and will benefit from the following plan.  Plan MDD- unstable PHQ-9 today equals 20 Increase Abilify to 10 mg p.o. daily Celexa 10 mg p.o. daily Lamictal 200 mg p.o. daily We will refer her to  therapist Ms. Christina Hussami  GAD- unstable Celexa 10 mg p.o. daily Hydroxyzine 25-50 mg p.o. daily as needed for severe anxiety attacks Klonopin 0.5 mg p.o. daily as needed for severe panic attacks   Insomnia- improving Trazodone 50 mg p.o. nightly as needed   History of ADHD- patient was referred for ADHD testing  I have communicated with staff to schedule this patient with our therapist.  I have also advised patient to contact her health insurance plan for therapist referral to see if she can establish care with a therapist sooner.  Follow-up in clinic in 2 weeks or sooner if needed.  This  note was generated in part or whole with voice recognition software. Voice recognition is usually quite accurate but there are transcription errors that can and very often do occur. I apologize for any typographical errors that were not detected and corrected.     Ursula Alert, Walker 07/14/2020, 12:37 PM

## 2020-07-17 ENCOUNTER — Ambulatory Visit: Payer: 59

## 2020-07-17 ENCOUNTER — Other Ambulatory Visit: Payer: Self-pay

## 2020-07-17 DIAGNOSIS — M25512 Pain in left shoulder: Secondary | ICD-10-CM | POA: Diagnosis not present

## 2020-07-17 DIAGNOSIS — M6281 Muscle weakness (generalized): Secondary | ICD-10-CM

## 2020-07-17 DIAGNOSIS — M25511 Pain in right shoulder: Secondary | ICD-10-CM | POA: Diagnosis not present

## 2020-07-17 DIAGNOSIS — G8929 Other chronic pain: Secondary | ICD-10-CM

## 2020-07-17 NOTE — Therapy (Signed)
[facility] [address] [place], [place], [address] Phone: [phone]   Fax:  [phone]  Physical Therapy Treatment  Patient Details  Name: Denise Walker MRN: [id number] Date of Birth: Feb 03, 1991 Referring Provider (PT): Dr. Alba Destine   Encounter Date: 07/17/2020   PT End of Session - 07/17/20 1313    Visit Number 8    Number of Visits 17    Date for PT Re-Evaluation 08/08/20    Authorization Type eval: 06/13/20    PT Start Time 1145    PT Stop Time 1230    PT Time Calculation (min) 45 min    Activity Tolerance Patient tolerated treatment well    Behavior During Therapy [facility] for tasks assessed/performed           Past Medical History:  Diagnosis Date  . Anxiety   . Back pain    low back pain with lumbar radiculitis  . Bilateral shoulder pain   . BRCA negative 04/2018   MyRisk neg; IBIS=8.9%/riskscore=6.7%  . Eclampsia   . Family history of ovarian cancer   . Gestational diabetes   . Migraine headache   . PCOS (polycystic ovarian syndrome)     Past Surgical History:  Procedure Laterality Date  . CESAREAN SECTION N/A 07/15/2014   Procedure: CESAREAN SECTION;  Surgeon: Gae Dry, MD;  Location: [facility] ORS;  Service: Obstetrics;  Laterality: N/A;  . CESAREAN SECTION N/A 02/10/2018   Procedure: CESAREAN SECTION;  Surgeon: Will Bonnet, MD;  Location: [facility] ORS;  Service: Obstetrics;  Laterality: N/A;  . CESAREAN SECTION N/A 12/31/2018   Procedure: CESAREAN SECTION;  Surgeon: [name], MD;  Location: [facility] ORS;  Service: Obstetrics;  Laterality: N/A;  . CESAREAN SECTION    . HERNIA REPAIR  10/2013  . WISDOM TOOTH EXTRACTION      There were no vitals filed for this visit.   Subjective Assessment - 07/17/20 1311    Subjective Pt reports that her psychiatrist increased her Abilify during her last appointment and she is hopeful that this will help her anxiety. She continues with significant L shoulder pain  but does not rate upon arrival today. It was aggravated over the weekend when she was hosting her oldest daughters birthday party. No specific questions upon arrival.    Pertinent History Pt reports that she started with bilateral shoulder pain in 2020 after her 32 year old was born. She has 43 kids (84 years old and younger with the two younger kids having been born 5 months apart). 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 Pt is L hand dominant. She has received steroid injections in both shoulders and had significant improvement in her R shoulder however her L shoulder has remained painful. She describes her shoulder pain as achy and sore. It is worse with lifting, touch, and reaching overhead with her LUE. Improves with rest, heat, and removing her bra. She also has chronic back pain but has had TFESI injections which have helped significantly. She reports numbness and weakness in her LLE but no LUE numbness or pain.    Limitations Lifting;House hold activities    Diagnostic tests Unable to find record of shoulder imaging    Patient Stated Goals Decrease L shoulder pain when lifting daughters out of crib and when putting them into her car    Currently in Pain? Yes    Pain Score --   Does not rate   Pain Location Shoulder  Pain Orientation Left    Pain Descriptors / Indicators Sore    Pain Onset More than a month ago    Pain Frequency Constant              TREATMENT   Manual Therapy Supine L shoulder PROMin all directions; Supine L shoulderA/P mobilization, grade I-II, 30s/bout x 3 bouts; Supine L shoulderdistraction mobilizations at different positions through L shoulder abduction, grade I-II, 30s/bout x 3 bouts; Supine L shoulderinferior mobilizations at 90 abduction, grade I-II, 30s/bout x 3 bouts; Supine L shoulder posterior and inferior mobilization at 120 flexion, grade I-II, 30s/bout x 3 bouts;   Ther-ex  Supine L shoulder  isometrics for flexion, abduction, adduction, and extension 5s hold x 10 in each direction; Supine L shoulder serratus punch with manual resistance x 10; Supine left shoulder rhythmic stabilization at 90 degrees of flexion and resistance provided at wrist x30 seconds Supine L shoulder flexion to 90 degrees with light manual resistance from therapist 2 x 10; Supine L shoulder abduction to 90 degrees with light manual resistance from therapist x 10; R sidelying L shoulder ER with manual resistance from therapist 2 x 10; R sidelying L shoulder abduction to 90 degrees 2 x 10;   Pt educated throughout session about proper posture and technique with exercises. Improved exercise technique, movement at target joints, use of target muscles after min to mod verbal, visual, tactile cues.    Continued with manual techniques for L shoulder pain including low grade glenohumeral mobilizations. Progressed L shoulder strengthening today including isometrics as well as AROM against gravity and with light resistance. She has a repeat cortisone injection scheduled soon for her L shoulder. Pt is making good progress toward her goal of decreased shoulder pain however remains with persistent pain.  No HEP progression today. Pt encouraged to follow-up as scheduled.Pt will benefit from PT services to address deficits inL shoulder pain andstrength in order to return to full function at home.                         PT Short Term Goals - 06/14/20 1606      PT SHORT TERM GOAL #1   Title Pt will be independent with HEP in order to improve shoulder strength and decrease pain in order to improve pain-free function at home and work.    Time 4    Period Weeks    Status New    Target Date 07/12/20             PT Long Term Goals - 06/14/20 1607      PT LONG TERM GOAL #1   Title Pt will decrease quick DASH score by at least 8% in order to demonstrate clinically significant reduction in  disability.    Baseline 06/13/20: Pt to complete at next visit    Time 8    Period Weeks    Status New    Target Date 08/08/20      PT LONG TERM GOAL #2   Title Pt will decrease worst pain as reported on NPRS by at least 3 points in order to demonstrate clinically significant reduction in pain.    Baseline 06/13/20: worst: 8/10;    Time 8    Period Weeks    Status New    Target Date 08/08/20      PT LONG TERM GOAL #3   Title Pt will be able to lift her daughters into her car and  pick them up out of the crib while reporting at worst 2/10 L shoulder pain    Baseline 06/13/20: 8/10 with lifting    Time 8    Period Weeks    Status New    Target Date 08/08/20      PT LONG TERM GOAL #4   Title Pt will report at least 75% improvement in her L shoulder symptoms in order to resume full responsibilities at home with less pain    Time 8    Period Weeks    Status New    Target Date 08/08/20                 Plan - 07/17/20 1313    Clinical Impression Statement Continued with manual techniques for L shoulder pain including low grade glenohumeral mobilizations. Progressed L shoulder strengthening today including isometrics as well as AROM against gravity and with light resistance. She has a repeat cortisone injection scheduled soon for her L shoulder. Pt is making good progress toward her goal of decreased shoulder pain however remains with persistent pain.  No HEP progression today. Pt encouraged to follow-up as scheduled. Pt will benefit from PT services to address deficits in L shoulder pain and strength in order to return to full function at home.    Personal Factors and Comorbidities Past/Current Experience;Time since onset of injury/illness/exacerbation;Comorbidity 2    Comorbidities Anxiety, migraines    Examination-Activity Limitations Carry;Reach Overhead;Lift;Bathing;Dressing;Caring for Others    Examination-Participation Restrictions Community Activity;Interpersonal  Relationship;Meal Prep;Occupation;Shop    Stability/Clinical Decision Making Evolving/Moderate complexity    Rehab Potential Good    PT Frequency 2x / week    PT Duration 8 weeks    PT Treatment/Interventions ADLs/Self Care Home Management;Aquatic Therapy;Biofeedback;Canalith Repostioning;Cryotherapy;Electrical Stimulation;Iontophoresis 24m/ml Dexamethasone;Moist Heat;Traction;Ultrasound;DME Instruction;Gait training;Stair training;Functional mobility training;Therapeutic activities;Therapeutic exercise;Balance training;Neuromuscular re-education;Cognitive remediation;Patient/family education;Manual techniques;Passive range of motion;Dry needling;Vestibular;Spinal Manipulations;Joint Manipulations    PT Next Visit Plan thoracic and cervical mobs, posture education, L upper quarter STM, TDN, initiate light strengthening/stretching    PT Home Exercise Plan Access Code: [email or web address]   Consulted and Agree with Plan of Care Patient           Patient will benefit from skilled therapeutic intervention in order to improve the following deficits and impairments:  Hypomobility,Pain,Decreased strength  Visit Diagnosis: Chronic left shoulder pain  Muscle weakness (generalized)     Problem List Patient Active Problem List   Diagnosis Date Noted  . Moderate episode of recurrent major depressive disorder ([place] 06/29/2020  . High risk medication use 06/29/2020  . History of ADHD 06/29/2020  . Uterine scar from previous cesarean delivery affecting pregnancy 12/31/2018  . Anti-D antibodies present during pregnancy 0June 20, 2020 . BRCA gene mutation negative 05/04/2018  . History of cesarean delivery 02/10/2018  . Rh negative state in antepartum period 08/23/2017  . History of eclampsia 07/22/2017  . Migraines 04/09/2017  . GAD (generalized anxiety disorder) 12/01/2014   JLyndel SafeHuprich PT, DPT, GCS  Lorilei Horan 07/17/2020, 1:20 PM  [facility] [place] [address]Phone: [phone]  Fax:  [phone] Name: KKeerat DenicolaMRN: [id number] of Birth: 912-24-1992

## 2020-07-19 ENCOUNTER — Ambulatory Visit
Admission: RE | Admit: 2020-07-19 | Discharge: 2020-07-19 | Disposition: A | Discharge status: Home / Self Care | Payer: 59 | Source: Ambulatory Visit | Attending: Obstetrics and Gynecology | Admitting: Obstetrics and Gynecology

## 2020-07-19 ENCOUNTER — Other Ambulatory Visit: Payer: Self-pay

## 2020-07-19 DIAGNOSIS — F411 Generalized anxiety disorder: Secondary | ICD-10-CM | POA: Insufficient documentation

## 2020-07-19 DIAGNOSIS — Z5181 Encounter for therapeutic drug level monitoring: Secondary | ICD-10-CM | POA: Insufficient documentation

## 2020-07-19 DIAGNOSIS — F331 Major depressive disorder, recurrent, moderate: Secondary | ICD-10-CM | POA: Insufficient documentation

## 2020-07-19 DIAGNOSIS — Z79899 Other long term (current) drug therapy: Secondary | ICD-10-CM | POA: Diagnosis not present

## 2020-07-20 ENCOUNTER — Ambulatory Visit: Payer: 59

## 2020-07-24 ENCOUNTER — Other Ambulatory Visit: Payer: Self-pay | Admitting: Psychiatry

## 2020-07-24 DIAGNOSIS — F411 Generalized anxiety disorder: Secondary | ICD-10-CM

## 2020-07-24 DIAGNOSIS — F331 Major depressive disorder, recurrent, moderate: Secondary | ICD-10-CM

## 2020-07-26 ENCOUNTER — Other Ambulatory Visit: Payer: Self-pay

## 2020-07-26 ENCOUNTER — Encounter: Payer: Self-pay | Admitting: Psychiatry

## 2020-07-26 ENCOUNTER — Telehealth (INDEPENDENT_AMBULATORY_CARE_PROVIDER_SITE_OTHER): Payer: 59 | Admitting: Psychiatry

## 2020-07-26 DIAGNOSIS — F331 Major depressive disorder, recurrent, moderate: Secondary | ICD-10-CM | POA: Diagnosis not present

## 2020-07-26 DIAGNOSIS — F411 Generalized anxiety disorder: Secondary | ICD-10-CM | POA: Diagnosis not present

## 2020-07-26 DIAGNOSIS — Z8659 Personal history of other mental and behavioral disorders: Secondary | ICD-10-CM

## 2020-07-26 DIAGNOSIS — M48062 Spinal stenosis, lumbar region with neurogenic claudication: Secondary | ICD-10-CM | POA: Diagnosis not present

## 2020-07-26 DIAGNOSIS — M5442 Lumbago with sciatica, left side: Secondary | ICD-10-CM | POA: Diagnosis not present

## 2020-07-26 DIAGNOSIS — M25512 Pain in left shoulder: Secondary | ICD-10-CM | POA: Diagnosis not present

## 2020-07-26 DIAGNOSIS — G8929 Other chronic pain: Secondary | ICD-10-CM | POA: Diagnosis not present

## 2020-07-26 MED ORDER — LAMOTRIGINE 25 MG PO TABS: 25.0000 mg | ORAL_TABLET | ORAL | 1 refills | Status: DC

## 2020-07-26 MED ORDER — CLONAZEPAM 0.5 MG PO TABS: 0.5000 mg | ORAL_TABLET | ORAL | 0 refills | Status: DC

## 2020-07-26 NOTE — Progress Notes (Signed)
Virtual Visit via Video Note  I connected with Denise Walker on 07/26/20 at  3:00 PM EDT by a video enabled telemedicine application and verified that I am speaking with the correct person using two identifiers.  Location Provider Location : [facility] Patient Location : Home  Participants: Patient , Provider   I discussed the limitations of evaluation and management by telemedicine and the availability of in person appointments. The patient expressed understanding and agreed to proceed.    I discussed the assessment and treatment plan with the patient. The patient was provided an opportunity to ask questions and all were answered. The patient agreed with the plan and demonstrated an understanding of the instructions.   The patient was advised to call back or seek an in-person evaluation if the symptoms worsen or if the condition fails to improve as anticipated.   [place] Denise Walker OP Progress Note  07/26/2020 5:54 PM Denise Walker  MRN:  [id number]  Chief Complaint:  Chief Complaint   Follow-up; Depression    HPI: Denise Walker is a 30 year old Caucasian female, stay-at-home mom, lives in [facility], has a history of MDD, GAD, history of ADHD was evaluated by telemedicine today.  Patient today reports she continues to struggle with mood swings.  She reports she has a few good days when she feels like she cannot do a lot, she takes up new projects however is unable to complete most of the time, spends money $400-$800 online buying things that she necessarily may not need.  She reports this usually happens every few months or so and last a week or so.  She reports last week she spent $150 and bought new clothes and shoes for her children.  She reports these were dresses and shoes that the children may not even wear.  Patient reports she then goes into these depressive episodes when she feels she cannot do much for a few days, she gets irritable, picks a fight with her husband for not helping her or doing  the things that she needs him to do.  She reports her husband does come back from work and try to help her however in spite of that she picks up fight with him.  She however reports she is still able to take care of her children when she goes through these episodes.  She reports since being on the higher dosage of Abilify her mood has improved however she continues to need help with medication management.  She reports Lamictal is like a miracle drug for her since she felt much better since being on the Lamictal.  She would like a dosage increase of the Lamictal if possible.  Patient does have multiple psychosocial stressors.  She reports her children continues to need her help, and her child has medical problems which is a stressor.  Patient has upcoming appointment with our therapist in July.  Patient denies any suicidality, homicidality or perceptual disturbances.  Patient denies any other concerns today.   Visit Diagnosis:    ICD-10-CM   1. Moderate episode of recurrent major depressive disorder (HCC)  F33.1 lamoTRIgine (LAMICTAL) 25 MG tablet    2. GAD (generalized anxiety disorder)  F41.1 clonazePAM (KLONOPIN) 0.5 MG tablet    3. History of ADHD  Z86.59       Past Psychiatric History: I have reviewed past psychiatric history from progress note on 06/29/2020. Past trials of medications- Lexapro Zoloft Celexa Trintellix Concerta Ritalin Wellbutrin  Past Medical History:  Past Medical History:  Diagnosis Date  Anxiety    Back pain    low back pain with lumbar radiculitis   Bilateral shoulder pain    BRCA negative 04/2018   MyRisk neg; IBIS=8.9%/riskscore=6.7%   Eclampsia    Family history of ovarian cancer    Gestational diabetes    Migraine headache    PCOS (polycystic ovarian syndrome)     Past Surgical History:  Procedure Laterality Date   CESAREAN SECTION N/A 07/15/2014   Procedure: CESAREAN SECTION;  Surgeon: Gae Dry, Denise Walker;  Location: [facility] ORS;  Service:  Obstetrics;  Laterality: N/A;   CESAREAN SECTION N/A 02/10/2018   Procedure: CESAREAN SECTION;  Surgeon: Will Bonnet, Denise Walker;  Location: [facility] ORS;  Service: Obstetrics;  Laterality: N/A;   CESAREAN SECTION N/A 12/31/2018   Procedure: CESAREAN SECTION;  Surgeon: [name], Denise Walker;  Location: [facility] ORS;  Service: Obstetrics;  Laterality: N/A;   CESAREAN SECTION     HERNIA REPAIR  10/2013   WISDOM TOOTH EXTRACTION      Family Psychiatric History: I have reviewed family psychiatric history from progress note on 06/29/2020  Family History:  Family History  Problem Relation Age of Onset   Bipolar disorder Mother    Ovarian cancer Mother    Arthritis Mother    Ovarian cancer Maternal Aunt    Diabetes Paternal Aunt    Breast cancer Maternal Grandmother    Diabetes Paternal Grandfather    Uterine cancer Cousin    ADD / ADHD Daughter    ODD Daughter     Social History: Reviewed social history from progress note on 06/29/2020 Social History   Socioeconomic History   Marital status: Married    Spouse name: Einar Pheasant   Number of children: 3   Years of education: high school   Highest education level: Not on file  Occupational History   Not on file  Tobacco Use   Smoking status: Never   Smokeless tobacco: Never  Vaping Use   Vaping Use: Never used  Substance and Sexual Activity   Alcohol use: Yes    Comment: occasional wine cooler   Drug use: Never   Sexual activity: Yes    Birth control/protection: I.U.D.    Comment: will discuss with Denise Walker  Other Topics Concern   Not on file  Social History Narrative   Not on file   Social Determinants of Health   Financial Resource Strain: Not on file  Food Insecurity: Not on file  Transportation Needs: Not on file  Physical Activity: Not on file  Stress: Not on file  Social Connections: Not on file    Allergies:  Allergies  Allergen Reactions   Fish Allergy Shortness Of Breath   Shellfish Allergy Shortness Of Breath     Metabolic Disorder Labs: Lab Results  Component Value Date   HGBA1C 5.5 06/08/2020   Lab Results  Component Value Date   PROLACTIN 8.8 03/27/2017   Lab Results  Component Value Date   CHOL 175 06/08/2020   TRIG 189 (H) 06/08/2020   HDL 47 06/08/2020   CHOLHDL 3.7 06/08/2020   LDLCALC 96 06/08/2020   Lab Results  Component Value Date   TSH 4.350 09/22/2019   TSH 2.170 03/27/2017    Therapeutic Level Labs: No results found for: LITHIUM No results found for: VALPROATE No components found for:  CBMZ  Current Medications: Current Outpatient Medications  Medication Sig Dispense Refill   lamoTRIgine (LAMICTAL) 25 MG tablet Take 1-2 tablets (25-50 mg total) by mouth as directed. Take  1 tablet daily along with 200 mg daily for 2 weeks and increase to 2 tablets daily along with 200 mg 60 tablet 1   ARIPiprazole (ABILIFY) 10 MG tablet Take 1 tablet (10 mg total) by mouth daily. 30 tablet 1   Butalbital-APAP-Caffeine 50-325-40 MG capsule Take by mouth.     citalopram (CELEXA) 10 MG tablet TAKE 1 TABLET BY MOUTH EVERY DAY 90 tablet 0   clonazePAM (KLONOPIN) 0.5 MG tablet Take 1 tablet (0.5 mg total) by mouth as directed. Take 1 tablet 1-2 times a week for severe anxiety attacks only 8 tablet 0   cyclobenzaprine (FLEXERIL) 10 MG tablet Take 1 tablet by mouth as needed.     hydrOXYzine (ATARAX/VISTARIL) 25 MG tablet TAKE 1-2 TABLETS (25-50 MG TOTAL) BY MOUTH DAILY AS NEEDED FOR ANXIETY. 180 tablet 0   lamoTRIgine (LAMICTAL) 200 MG tablet Take 1 tablet (200 mg total) by mouth daily. 90 tablet 0   levonorgestrel (MIRENA) 20 MCG/24HR IUD by Intrauterine route.     traZODone (DESYREL) 50 MG tablet      No current facility-administered medications for this visit.     Musculoskeletal: Strength & Muscle Tone:  UTA Gait & Station:  UTA Patient leans: N/A  Psychiatric Specialty Exam: Review of Systems  Psychiatric/Behavioral:  Positive for decreased concentration and dysphoric  mood. The patient is nervous/anxious.   All other systems reviewed and are negative.  not currently breastfeeding.There is no height or weight on file to calculate BMI.  General Appearance: Casual  Eye Contact:  Fair  Speech:  Clear and Coherent  Volume:  Normal  Mood:  Anxious, Depressed, and Irritable  Affect:  Appropriate  Thought Process:  Goal Directed and Descriptions of Associations: Intact  Orientation:  Full (Time, Place, and Person)  Thought Content: Logical   Suicidal Thoughts:  No  Homicidal Thoughts:  No  Memory:  Immediate;   Fair Recent;   Fair Remote;   Fair  Judgement:  Fair  Insight:  Fair  Psychomotor Activity:  Normal  Concentration:  Concentration: Fair and Attention Span: Fair  Recall:  [employer] of Knowledge: Fair  Language: Fair  Akathisia:  No  Handed:  Right  AIMS (if indicated): not done  Assets:  Communication Skills Desire for [place] Talents/Skills Transportation  ADL's:  Intact  Cognition: WNL  Sleep:  Fair   Screenings: GAD-7    Flowsheet Row Video Visit from 06/29/2020 in [place] Office Visit from 06/08/2020 in [place] Office Visit from 05/04/2020 in [place] Office Visit from 11/18/2019 in [place] Office Visit from 08/25/2019 in [place]  Total GAD-7 Score 17 11 14 19 18       PHQ2-9    [place]ess[place] Video Visit from 07/14/2020 in [facility] [address] Video Visit from 06/29/2020 in [place] Office Visit from 06/08/2020 in [place] Office Visit from 05/04/2020 in [place] Office Visit from 11/18/2019 in [place]  PHQ-2 Total Score 6 4 4 2 4   PHQ-9 Total Score 20 13 14 11 17       Flowsheet Row Video Visit from 06/29/2020 in [place] [facility] from 04/17/2020 in [facility]  [place] No Risk No Risk        Assessment and Plan: Jazari Ober is a 30 year old Caucasian female, lives in [place], married, has a  history of depression, anxiety, PCOS, chronic pain was evaluated by telemedicine today.  Patient is currently struggling with mood symptoms, does have multiple psychosocial stressors.  She will benefit from medication management and psychotherapy sessions.  Plan MDD-unstable Completed mood disorder questionnaire in session today since patient was worried about likely having bipolar disorder. Patient did not score high.  We will continue to monitor patient closely and reevaluate in future sessions. Continue Abilify 10 mg p.o. daily Celexa 10 mg p.o. daily Increase Lamictal to 225 mg p.o. daily for 2 weeks and then to 250 mg p.o. daily after that. She has upcoming appointment with therapist   GAD-unstable Celexa 10 mg p.o. daily Hydroxyzine 25-50 mg p.o. daily as needed for severe anxiety attacks Klonopin 0.5 mg as needed for severe panic attacks only Referred for CBT  Insomnia-improving Trazodone 50 mg p.o. nightly as needed  History of ADHD-patient was referred for ADHD testing.  I have communicated with staff for a status update.  Follow-up in clinic in 4 weeks or sooner if needed.  This note was generated in part or whole with voice recognition software. Voice recognition is usually quite accurate but there are transcription errors that can and very often do occur. I apologize for any typographical errors that were not detected and corrected.      Denise Alert, Denise Walker 07/27/2020, 6:37 PM

## 2020-07-31 ENCOUNTER — Other Ambulatory Visit: Payer: Self-pay

## 2020-07-31 ENCOUNTER — Ambulatory Visit: Payer: 59

## 2020-07-31 DIAGNOSIS — G8929 Other chronic pain: Secondary | ICD-10-CM

## 2020-07-31 DIAGNOSIS — M6281 Muscle weakness (generalized): Secondary | ICD-10-CM | POA: Diagnosis not present

## 2020-07-31 DIAGNOSIS — M25511 Pain in right shoulder: Secondary | ICD-10-CM | POA: Diagnosis not present

## 2020-07-31 DIAGNOSIS — M25512 Pain in left shoulder: Secondary | ICD-10-CM | POA: Diagnosis not present

## 2020-07-31 NOTE — Therapy (Signed)
[facility] [address] [place], [place], [address] Phone: [phone]   Fax:  [phone]  Physical Therapy Treatment  Patient Details  Name: Denise Walker MRN: [id number] Date of Birth: 07-Jan-1991 Referring Provider (PT): Dr. Alba Destine   Encounter Date: 07/31/2020   PT End of Session - 07/31/20 1516     Visit Number 9    Number of Visits 17    Date for PT Re-Evaluation 08/08/20    Authorization Type eval: 06/13/20    PT Start Time 1145    PT Stop Time 1230    PT Time Calculation (min) 45 min    Activity Tolerance Patient tolerated treatment well    Behavior During Therapy Denise Walker for tasks assessed/performed             Past Medical History:  Diagnosis Date   Anxiety    Back pain    low back pain with lumbar radiculitis   Bilateral shoulder pain    BRCA negative 04/2018   MyRisk neg; IBIS=8.9%/riskscore=6.7%   Eclampsia    Family history of ovarian cancer    Gestational diabetes    Migraine headache    PCOS (polycystic ovarian syndrome)     Past Surgical History:  Procedure Laterality Date   CESAREAN SECTION N/A 07/15/2014   Procedure: CESAREAN SECTION;  Surgeon: Gae Dry, MD;  Location: [facility] ORS;  Service: Obstetrics;  Laterality: N/A;   CESAREAN SECTION N/A 02/10/2018   Procedure: CESAREAN SECTION;  Surgeon: Will Bonnet, MD;  Location: [facility] ORS;  Service: Obstetrics;  Laterality: N/A;   CESAREAN SECTION N/A 12/31/2018   Procedure: CESAREAN SECTION;  Surgeon: [name], MD;  Location: [facility] ORS;  Service: Obstetrics;  Laterality: N/A;   CESAREAN SECTION     HERNIA REPAIR  10/2013   WISDOM TOOTH EXTRACTION      There were no vitals filed for this visit.   Subjective Assessment - 07/31/20 1458     Subjective Pt reports that she got her L shoulder injection last week and her shoulder pain has improved significantly. She rates her L shoulder pain as "less than 1/10" currently. No specific  questions upon arrival.    Pertinent History Pt reports that she started with bilateral shoulder pain in 2020 after her 32 year old was born. She has 69 kids (36 years old and younger with the two younger kids having been born 6 months apart). 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 Pt is L hand dominant. She has received steroid injections in both shoulders and had significant improvement in her R shoulder however her L shoulder has remained painful. She describes her shoulder pain as achy and sore. It is worse with lifting, touch, and reaching overhead with her LUE. Improves with rest, heat, and removing her bra. She also has chronic back pain but has had TFESI injections which have helped significantly. She reports numbness and weakness in her LLE but no LUE numbness or pain.    Limitations Lifting;House hold activities    Diagnostic tests Unable to find record of shoulder imaging    Patient Stated Goals Decrease L shoulder pain when lifting daughters out of crib and when putting them into her car    Currently in Pain? Yes    Pain Score --   "Less than 1/10"   Pain Location Shoulder    Pain Orientation Left    Pain Descriptors / Indicators Sore    Pain  Type Chronic pain    Pain Onset More than a month ago                 TREATMENT     Manual Therapy  Supine L shoulder PROM in all directions; Supine L shoulder A/P mobilization, grade I-II, 30s/bout x 3 bouts; Supine L shoulder distraction mobilizations at different positions through L shoulder abduction, grade I-II, 30s/bout x 3 bouts; Supine L shoulder inferior mobilizations at 90 abduction, grade I-II, 30s/bout x 3 bouts; Supine L shoulder posterior and inferior mobilization at 120 flexion, grade I-II, 30s/bout x 3 bouts; Supine L upper trap and scalene (sidebend) stretches x 30s each;     Ther-ex  Supine L shoulder serratus punch with manual resistance 2 x 10; Supine left shoulder  rhythmic stabilization at 90 degrees of flexion and resistance provided at wrist x30 seconds Supine L shoulder flexion to 90 degrees with 3# dumbbell (DB) 2 x 10; Supine L shoulder circles at 90 flexion with 3# DB 2 x 10; R sidelying L shoulder abduction to 90 degrees with 2# DB 2 x 10; R sidelying L shoulder ER with 2# DB 2 x 10; Seated rows with green tband 2 x 10; Seated "W's" with green tband 2 x 10; Standing L shoulder extension with green tband x 10;     Pt educated throughout session about proper posture and technique with exercises. Improved exercise technique, movement at target joints, use of target muscles after min to mod verbal, visual, tactile cues.      Continued with manual techniques for L shoulder pain including low grade glenohumeral mobilizations. However given relief of pain since L shoulder injection additional time spent today on L shoulder/periscapular strengthening. Will utilize therapeutic window offered by steroid injection to strengthen shoulder and hopefully reduce the risk of pain returning. Pt will need a progress note at next visit.  Pt is making good progress toward her goal of decreased shoulder pain however remains with persistent pain.  No HEP progression today. Pt encouraged to follow-up as scheduled. Pt will benefit from PT services to address deficits in L shoulder pain and strength in order to return to full function at home.                         PT Short Term Goals - 06/14/20 1606       PT SHORT TERM GOAL #1   Title Pt will be independent with HEP in order to improve shoulder strength and decrease pain in order to improve pain-free function at home and work.    Time 4    Period Weeks    Status New    Target Date 07/12/20               PT Long Term Goals - 06/14/20 1607       PT LONG TERM GOAL #1   Title Pt will decrease quick DASH score by at least 8% in order to demonstrate clinically significant reduction in  disability.    Baseline 06/13/20: Pt to complete at next visit    Time 8    Period Weeks    Status New    Target Date 08/08/20      PT LONG TERM GOAL #2   Title Pt will decrease worst pain as reported on NPRS by at least 3 points in order to demonstrate clinically significant reduction in pain.    Baseline 06/13/20: worst: 8/10;  Time 8    Period Weeks    Status New    Target Date 08/08/20      PT LONG TERM GOAL #3   Title Pt will be able to lift her daughters into her car and pick them up out of the crib while reporting at worst 2/10 L shoulder pain    Baseline 06/13/20: 8/10 with lifting    Time 8    Period Weeks    Status New    Target Date 08/08/20      PT LONG TERM GOAL #4   Title Pt will report at least 75% improvement in her L shoulder symptoms in order to resume full responsibilities at home with less pain    Time 8    Period Weeks    Status New    Target Date 08/08/20                   Plan - 07/31/20 1516     Clinical Impression Statement Continued with manual techniques for L shoulder pain including low grade glenohumeral mobilizations. However given relief of pain since L shoulder injection additional time spent today on L shoulder/periscapular strengthening. Will utilize therapeutic window offered by steroid injection to strengthen shoulder and hopefully reduce the risk of pain returning. Pt will need a progress note at next visit.  Pt is making good progress toward her goal of decreased shoulder pain however remains with persistent pain.  No HEP progression today. Pt encouraged to follow-up as scheduled. Pt will benefit from PT services to address deficits in L shoulder pain and strength in order to return to full function at home.    Personal Factors and Comorbidities Past/Current Experience;Time since onset of injury/illness/exacerbation;Comorbidity 2    Comorbidities Anxiety, migraines    Examination-Activity Limitations Carry;Reach  Overhead;Lift;Bathing;Dressing;Caring for Others    Examination-Participation Restrictions Community Activity;Interpersonal Relationship;Meal Prep;Occupation;Shop    Stability/Clinical Decision Making Evolving/Moderate complexity    Rehab Potential Good    PT Frequency 2x / week    PT Duration 8 weeks    PT Treatment/Interventions ADLs/Self Care Home Management;Aquatic Therapy;Biofeedback;Canalith Repostioning;Cryotherapy;Electrical Stimulation;Iontophoresis 5m/ml Dexamethasone;Moist Heat;Traction;Ultrasound;DME Instruction;Gait training;Stair training;Functional mobility training;Therapeutic activities;Therapeutic exercise;Balance training;Neuromuscular re-education;Cognitive remediation;Patient/family education;Manual techniques;Passive range of motion;Dry needling;Vestibular;Spinal Manipulations;Joint Manipulations    PT Next Visit Plan thoracic and cervical mobs, posture education, L upper quarter STM, TDN, initiate light strengthening/stretching    PT Home Exercise Plan Access Code: [email or web address]   Consulted and Agree with Plan of Care Patient             Patient will benefit from skilled therapeutic intervention in order to improve the following deficits and impairments:  Hypomobility, Pain, Decreased strength  Visit Diagnosis: Chronic left shoulder pain  Muscle weakness (generalized)     Problem List Patient Active Problem List   Diagnosis Date Noted   Moderate episode of recurrent major depressive disorder ([place] 06/29/2020   High risk medication use 06/29/2020   History of ADHD 06/29/2020   Uterine scar from previous cesarean delivery affecting pregnancy 12/31/2018   Anti-D antibodies present during pregnancy 0Jun 03, 2020  BRCA gene mutation negative 05/04/2018   History of cesarean delivery 02/10/2018   Rh negative state in antepartum period 08/23/2017   History of eclampsia 07/22/2017   Migraines 04/09/2017   GAD (generalized anxiety disorder) 12/01/2014   JLyndel Safe Denise Walker PT, DPT, GCS  Denise Walker 07/31/2020, 3:21 PM  [facility] [place] [address]Phone:  [phone]   Fax:  [phone]  Name: Denise Walker MRN: [id number] Date of Birth: September 28, 1990

## 2020-08-02 ENCOUNTER — Ambulatory Visit: Payer: 59

## 2020-08-02 ENCOUNTER — Other Ambulatory Visit: Payer: Self-pay

## 2020-08-02 DIAGNOSIS — M6281 Muscle weakness (generalized): Secondary | ICD-10-CM | POA: Diagnosis not present

## 2020-08-02 DIAGNOSIS — G8929 Other chronic pain: Secondary | ICD-10-CM | POA: Diagnosis not present

## 2020-08-02 DIAGNOSIS — M25511 Pain in right shoulder: Secondary | ICD-10-CM | POA: Diagnosis not present

## 2020-08-02 DIAGNOSIS — M25512 Pain in left shoulder: Secondary | ICD-10-CM

## 2020-08-02 NOTE — Therapy (Addendum)
[facility] [address] [place], [place], [address] Phone: [phone]   Fax:  [phone]  Physical Therapy Progress Note/Recertification   Dates of reporting period  06/13/20   to   08/02/20  Patient Details  Name: Denise Walker MRN: [id number] Date of Birth: 23-Aug-1990 Referring Provider (PT): Dr. Alba Destine   Encounter Date: 08/02/2020   PT End of Session - 08/02/20 1700     Visit Number 10    Number of Visits 33   Date for PT Re-Evaluation 09/27/20    Authorization Type eval: 10/15/79, Recertification 0/17/51   PT Start Time 1149    PT Stop Time 1230    PT Time Calculation (min) 41 min    Activity Tolerance Patient tolerated treatment well    Behavior During Therapy [facility] for tasks assessed/performed             Past Medical History:  Diagnosis Date   Anxiety    Back pain    low back pain with lumbar radiculitis   Bilateral shoulder pain    BRCA negative 04/2018   MyRisk neg; IBIS=8.9%/riskscore=6.7%   Eclampsia    Family history of ovarian cancer    Gestational diabetes    Migraine headache    PCOS (polycystic ovarian syndrome)     Past Surgical History:  Procedure Laterality Date   CESAREAN SECTION N/A 07/15/2014   Procedure: CESAREAN SECTION;  Surgeon: Gae Dry, MD;  Location: [facility];  Service: Obstetrics;  Laterality: N/A;   CESAREAN SECTION N/A 02/10/2018   Procedure: CESAREAN SECTION;  Surgeon: Will Bonnet, MD;  Location: [facility];  Service: Obstetrics;  Laterality: N/A;   CESAREAN SECTION N/A 12/31/2018   Procedure: CESAREAN SECTION;  Surgeon: [name], MD;  Location: [facility] OR[facility];  Service: Obstetrics;  Laterality: N/A;   CESAREAN SECTION     HERNIA REPAIR  10/2013   WISDOM TOOTH EXTRACTION      There were no vitals filed for this visit.   Subjective Assessment - 08/02/20 1155     Subjective Pt reports that she is doing well today. She denies any resting pain upon arrival today.  Overall she has had significant improvement in pain since her shoulder steroid injection. No specific questions upon arrival.    Pertinent History Pt reports that she started with bilateral shoulder pain in 2020 after her 32 year old was born. She has 7 kids (72 years old and younger with the two younger kids having been born 10 months apart). 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 Pt is L hand dominant. She has received steroid injections in both shoulders and had significant improvement in her R shoulder however her L shoulder has remained painful. She describes her shoulder pain as achy and sore. It is worse with lifting, touch, and reaching overhead with her LUE. Improves with rest, heat, and removing her bra. She also has chronic back pain but has had TFESI injections which have helped significantly. She reports numbness and weakness in her LLE but no LUE numbness or pain.    Limitations Lifting;House hold activities    Diagnostic tests Unable to find record of shoulder imaging    Patient Stated Goals Decrease L shoulder pain when lifting daughters out of crib and when putting them into her car    Currently in Pain? No/denies    Pain Onset --  TREATMENT     Manual Therapy  Updated outcome measures and goals with patient; QuickDASH: 11.4% Supine L shoulder PROM in all directions; Supine L shoulder A/P mobilization, grade I-II, 30s/bout x 3 bouts; Supine L shoulder distraction mobilizations at different positions through L shoulder abduction, grade I-II, 30s/bout x 3 bouts; Supine L shoulder inferior mobilizations at 90 abduction, grade I-II, 30s/bout x 3 bouts; Supine L shoulder posterior and inferior mobilization at 120 flexion, grade I-II, 30s/bout x 3 bouts;     Ther-ex  Supine L shoulder serratus punch with manual resistance 2 x 10; Supine left shoulder rhythmic stabilization at 90 degrees of flexion and resistance  provided at wrist x30 seconds Supine L shoulder flexion to 90 degrees with 3# dumbbell (DB) 2 x 10; R sidelying L shoulder abduction to 90 degrees with 3# DB 2 x 10; R sidelying L shoulder ER with 3# DB 2 x 10; Nautilus bicep curls 30# x 10; Nautilus rows 30# x 10; Nautilus chest press 30# x 10; Nautilus shoulder extension 30# x 10; Nautilus lat pull down 40# x 10, reports some L shoulder pain at end range flexion;    Pt educated throughout session about proper posture and technique with exercises. Improved exercise technique, movement at target joints, use of target muscles after min to mod verbal, visual, tactile cues.      Updated outcome measures and goals with patient today. Her QuickDASH is 11.4% indicating low disability related to her L shoulder pain. Her worst L shoulder pain has decreased from 8/10 initially to 3/10 today and she is able to lift her daughters into their car seats with significantly less pain. Overall she reports approximately 70-80% improvement in symptoms since starting therapy. Most of her pain relief has come following her most recent L shoulder injection. Therapy is utilize therapeutic window while her L shoulder pain is decreased to focus on strengthening for long term management/reduction in pain. Continued with manual techniques for L shoulder pain including low grade glenohumeral mobilizations. Pt is making good progress toward her goal of decreased shoulder pain and improved function.  No HEP progression today. Pt encouraged to follow-up as scheduled. Pt will benefit from PT services to address deficits in L shoulder pain and strength in order to return to full function at home.                       PT Short Term Goals - 08/02/20 1155       PT SHORT TERM GOAL #1   Title Pt will be independent with HEP in order to improve shoulder strength and decrease pain in order to improve pain-free function at home and work.    Time 4    Period Weeks     Status On-going    Target Date 08/30/20               PT Long Term Goals - 08/02/20 1156       PT LONG TERM GOAL #1   Title Pt will decrease quick DASH score by at least 8% in order to demonstrate clinically significant reduction in disability.    Baseline 06/13/20: Pt to complete at next visit; 08/02/20: 11.4%    Time 8    Period Weeks    Status On-going    Target Date 09/27/20      PT LONG TERM GOAL #2   Title Pt will decrease worst pain as reported on NPRS by at least 3 points in  order to demonstrate clinically significant reduction in pain.    Baseline 06/13/20: worst: 8/10; 08/02/20: 3/10    Time 8    Period Weeks    Status Achieved    Target Date      PT LONG TERM GOAL #3   Title Pt will be able to lift her daughters into her car and pick them up out of the crib while reporting at worst 2/10 L shoulder pain    Baseline 06/13/20: 8/10 with lifting; 08/02/20: 3/10    Time 8    Period Weeks    Status Partially Met    Target Date 09/27/20      PT LONG TERM GOAL #4   Title Pt will report at least 75% improvement in her L shoulder symptoms in order to resume full responsibilities at home with less pain    Baseline 08/02/20: 70-80% improved    Time 8    Period Weeks    Status Partially Met    Target Date 09/27/20                   Plan - 08/02/20 1701     Clinical Impression Statement Updated outcome measures and goals with patient today. Her QuickDASH is 11.4% indicating low disability related to her L shoulder pain. Her worst L shoulder pain has decreased from 8/10 initially to 3/10 today and she is able to lift her daughters into their car seats with significantly less pain. Overall she reports approximately 70-80% improvement in symptoms since starting therapy. Most of her pain relief has come following her most recent L shoulder injection. Therapy is utilize therapeutic window while her L shoulder pain is decreased to focus on strengthening for long term  management/reduction in pain. Continued with manual techniques for L shoulder pain including low grade glenohumeral mobilizations. Pt is making good progress toward her goal of decreased shoulder pain and improved function.  No HEP progression today. Pt encouraged to follow-up as scheduled. Pt will benefit from PT services to address deficits in L shoulder pain and strength in order to return to full function at home.    Personal Factors and Comorbidities Past/Current Experience;Time since onset of injury/illness/exacerbation;Comorbidity 2    Comorbidities Anxiety, migraines    Examination-Activity Limitations Carry;Reach Overhead;Lift;Bathing;Dressing;Caring for Others    Examination-Participation Restrictions Community Activity;Interpersonal Relationship;Meal Prep;Occupation;Shop    Stability/Clinical Decision Making Evolving/Moderate complexity    Rehab Potential Good    PT Frequency 2x / week    PT Duration 8 weeks    PT Treatment/Interventions ADLs/Self Care Home Management;Aquatic Therapy;Biofeedback;Canalith Repostioning;Cryotherapy;Electrical Stimulation;Iontophoresis 71m/ml Dexamethasone;Moist Heat;Traction;Ultrasound;DME Instruction;Gait training;Stair training;Functional mobility training;Therapeutic activities;Therapeutic exercise;Balance training;Neuromuscular re-education;Cognitive remediation;Patient/family education;Manual techniques;Passive range of motion;Dry needling;Vestibular;Spinal Manipulations;Joint Manipulations    PT Next Visit Plan thoracic and cervical mobs, posture education, L upper quarter STM, TDN, initiate light strengthening/stretching    PT Home Exercise Plan Access Code: [email or web address]   Consulted and Agree with Plan of Care Patient             Patient will benefit from skilled therapeutic intervention in order to improve the following deficits and impairments:  Hypomobility, Pain, Decreased strength  Visit Diagnosis: Chronic left shoulder pain  Muscle weakness  (generalized)     Problem List Patient Active Problem List   Diagnosis Date Noted   Moderate episode of recurrent major depressive disorder ([place] 06/29/2020   High risk medication use 06/29/2020   History of ADHD 06/29/2020   Uterine scar from previous cesarean delivery affecting pregnancy  12/31/2018   Anti-D antibodies present during pregnancy 07-22-18   BRCA gene mutation negative 05/04/2018   History of cesarean delivery 02/10/2018   Rh negative state in antepartum period 08/23/2017   History of eclampsia 07/22/2017   Migraines 04/09/2017   GAD (generalized anxiety disorder) 12/01/2014   Lyndel Safe Seerat Peaden PT, DPT, GCS  Xzavien Harada 08/02/2020, 5:12 PM  [facility] [address] [place], [place], [address] Phone: [phone]   Fax:  [phone]  Name: Narcissus Detwiler MRN: [id number] Date of Birth: [birth year]

## 2020-08-07 ENCOUNTER — Ambulatory Visit: Payer: 59 | Admitting: Physical Therapy

## 2020-08-07 ENCOUNTER — Encounter: Payer: Self-pay | Admitting: Physical Therapy

## 2020-08-07 ENCOUNTER — Other Ambulatory Visit: Payer: Self-pay

## 2020-08-07 DIAGNOSIS — M6281 Muscle weakness (generalized): Secondary | ICD-10-CM

## 2020-08-07 DIAGNOSIS — M25512 Pain in left shoulder: Secondary | ICD-10-CM

## 2020-08-07 DIAGNOSIS — G8929 Other chronic pain: Secondary | ICD-10-CM | POA: Diagnosis not present

## 2020-08-07 DIAGNOSIS — M25511 Pain in right shoulder: Secondary | ICD-10-CM | POA: Diagnosis not present

## 2020-08-07 NOTE — Therapy (Signed)
[facility] [address] [place], [place], [address] Phone: [phone]   Fax:  [phone]  Physical Therapy Treatment  Patient Details  Name: Charlei Ramsaran MRN: [id number] Date of Birth: [birth year] Referring Provider (PT): Dr. Alba Destine   Encounter Date: 08/07/2020   PT End of Session - 08/07/20 1724     Visit Number 11    Number of Visits 17    Date for PT Re-Evaluation 08/08/20    Authorization Type eval: 06/13/20    PT Start Time 1106    PT Stop Time 1146    PT Time Calculation (min) 40 min    Activity Tolerance Patient tolerated treatment well    Behavior During Therapy [facility] for tasks assessed/performed             Past Medical History:  Diagnosis Date   Anxiety    Back pain    low back pain with lumbar radiculitis   Bilateral shoulder pain    BRCA negative 04/2018   MyRisk neg; IBIS=8.9%/riskscore=6.7%   Eclampsia    Family history of ovarian cancer    Gestational diabetes    Migraine headache    PCOS (polycystic ovarian syndrome)     Past Surgical History:  Procedure Laterality Date   CESAREAN SECTION N/A 07/15/2014   Procedure: CESAREAN SECTION;  Surgeon: Gae Dry, MD;  Location: [facility];  Service: Obstetrics;  Laterality: N/A;   CESAREAN SECTION N/A 02/10/2018   Procedure: CESAREAN SECTION;  Surgeon: Will Bonnet, MD;  Location: [facility];  Service: Obstetrics;  Laterality: N/A;   CESAREAN SECTION N/A 12/31/2018   Procedure: CESAREAN SECTION;  Surgeon: [name], MD;  Location: [facility];  Service: Obstetrics;  Laterality: N/A;   CESAREAN SECTION     HERNIA REPAIR  10/2013   WISDOM TOOTH EXTRACTION      There were no vitals filed for this visit.   Subjective Assessment - 08/07/20 1722     Subjective Pt reports that she is doing okay today. She does endorse 3/10 pain upon arrival today, stating she has had increased pain over the past 3 days since carrying multiple loads of firewood.  Despite the recent pain, she is still noticing significant improvement in pain since her shoulder steroid injection. No specific questions upon arrival.    Pertinent History Pt reports that she started with bilateral shoulder pain in 2020 after her 32 year old was born. She has 28 kids (52 years old and younger with the two younger kids having been born 79 months apart). 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 Pt is L hand dominant. She has received steroid injections in both shoulders and had significant improvement in her R shoulder however her L shoulder has remained painful. She describes her shoulder pain as achy and sore. It is worse with lifting, touch, and reaching overhead with her LUE. Improves with rest, heat, and removing her bra. She also has chronic back pain but has had TFESI injections which have helped significantly. She reports numbness and weakness in her LLE but no LUE numbness or pain.    Limitations Lifting;House hold activities    Diagnostic tests Unable to find record of shoulder imaging    Patient Stated Goals Decrease L shoulder pain when lifting daughters out of crib and when putting them into her car    Pain Score 3     Pain Location Shoulder    Pain Orientation Left  Pain Descriptors / Indicators Sore    Pain Type Chronic pain               TREATMENT     Manual Therapy  Supine L shoulder PROM in all directions; Supine L shoulder A/P mobilization, grade I-II, 30s/bout x 3 bouts; Supine L shoulder distraction mobilizations at different positions through L shoulder abduction, grade I-II, 30s/bout x 3 bouts; Supine L shoulder inferior mobilizations at 90 abduction, grade I-II, 30s/bout x 3 bouts; Supine L shoulder posterior and inferior mobilization at 120 flexion, grade I-II, 30s/bout x 3 bouts;     Ther-ex  Supine L shoulder serratus punch with manual resistance 2 x 10; Supine left shoulder rhythmic stabilization  at 90 degrees of flexion and resistance provided at wrist x30 seconds Supine L shoulder flexion to 90 degrees with 3# dumbbell (DB) 2 x 10; R sidelying L shoulder abduction to 90 degrees with 3# DB 2 x 10; R sidelying L shoulder ER with 3# DB 2 x 10; Nautilus bicep curls 30# x 10; Nautilus rows 30# 2 x 10; Nautilus chest press 30# 2 x 10; Nautilus lat pull down 30# 2 x 10;     Pt educated throughout session about proper posture and technique with exercises. Improved exercise technique, movement at target joints, use of target muscles after min to mod verbal, visual, tactile cues.      Patient arrives with excellent motivation today. She is continuing to feel the benefits of the steroid injection she received in her left shoulder. Therapy is continuing to utilize therapeutic window while her L shoulder pain is decreased to focus on strengthening for long term management/reduction in pain. There-ex was progressed with increased sets on the Nautilus. Continued with manual techniques for L shoulder pain including low grade glenohumeral mobilizations. Pt is making good progress toward her goal of decreased shoulder pain and improved function.  No HEP progression today. Pt encouraged to follow-up as scheduled. Pt will benefit from PT services to address deficits in L shoulder pain and strength in order to return to full function at home.         PT Short Term Goals - 08/02/20 1155       PT SHORT TERM GOAL #1   Title Pt will be independent with HEP in order to improve shoulder strength and decrease pain in order to improve pain-free function at home and work.    Time 4    Period Weeks    Status On-going    Target Date 07/12/20               PT Long Term Goals - 08/02/20 1156       PT LONG TERM GOAL #1   Title Pt will decrease quick DASH score by at least 8% in order to demonstrate clinically significant reduction in disability.    Baseline 06/13/20: Pt to complete at next visit;  08/02/20: 11.4%    Time 8    Period Weeks    Status On-going    Target Date 08/08/20      PT LONG TERM GOAL #2   Title Pt will decrease worst pain as reported on NPRS by at least 3 points in order to demonstrate clinically significant reduction in pain.    Baseline 06/13/20: worst: 8/10; 08/02/20: 3/10    Time 8    Period Weeks    Status Achieved    Target Date 08/08/20      PT LONG TERM GOAL #3  Title Pt will be able to lift her daughters into her car and pick them up out of the crib while reporting at worst 2/10 L shoulder pain    Baseline 06/13/20: 8/10 with lifting; 08/02/20: 3/10    Time 8    Period Weeks    Status Partially Met    Target Date 08/08/20      PT LONG TERM GOAL #4   Title Pt will report at least 75% improvement in her L shoulder symptoms in order to resume full responsibilities at home with less pain    Baseline 08/02/20: 70-80% improved    Time 8    Period Weeks    Status Partially Met    Target Date 08/08/20                   Plan - 08/07/20 1727     Clinical Impression Statement Patient arrives with excellent motivation today. She is continuing to feel the benefits of the steroid injection she received in her left shoulder. Therapy is continuing to utilize therapeutic window while her L shoulder pain is decreased to focus on strengthening for long term management/reduction in pain. There-ex was progressed with increased sets on the Nautilus. Continued with manual techniques for L shoulder pain including low grade glenohumeral mobilizations. Pt is making good progress toward her goal of decreased shoulder pain and improved function.  No HEP progression today. Pt encouraged to follow-up as scheduled. Pt will benefit from PT services to address deficits in L shoulder pain and strength in order to return to full function at home.    Personal Factors and Comorbidities Past/Current Experience;Time since onset of injury/illness/exacerbation;Comorbidity 2     Comorbidities Anxiety, migraines    Examination-Activity Limitations Carry;Reach Overhead;Lift;Bathing;Dressing;Caring for Others    Examination-Participation Restrictions Community Activity;Interpersonal Relationship;Meal Prep;Occupation;Shop    Stability/Clinical Decision Making Evolving/Moderate complexity    Rehab Potential Good    PT Frequency 2x / week    PT Duration 8 weeks    PT Treatment/Interventions ADLs/Self Care Home Management;Aquatic Therapy;Biofeedback;Canalith Repostioning;Cryotherapy;Electrical Stimulation;Iontophoresis 4mg /ml Dexamethasone;Moist Heat;Traction;Ultrasound;DME Instruction;Gait training;Stair training;Functional mobility training;Therapeutic activities;Therapeutic exercise;Balance training;Neuromuscular re-education;Cognitive remediation;Patient/family education;Manual techniques;Passive range of motion;Dry needling;Vestibular;Spinal Manipulations;Joint Manipulations    PT Next Visit Plan thoracic and cervical mobs, posture education, L upper quarter STM, TDN, initiate light strengthening/stretching    PT Home Exercise Plan Access Code: [email or web address]    Consulted and Agree with Plan of Care Patient             Patient will benefit from skilled therapeutic intervention in order to improve the following deficits and impairments:  Hypomobility, Pain, Decreased strength  Visit Diagnosis: Chronic left shoulder pain  Muscle weakness (generalized)  Chronic right shoulder pain     Problem List Patient Active Problem List   Diagnosis Date Noted   Moderate episode of recurrent major depressive disorder ([place]) 06/29/2020   High risk medication use 06/29/2020   History of ADHD 06/29/2020   Uterine scar from previous cesarean delivery affecting pregnancy 12/31/2018   Anti-D antibodies present during pregnancy 30-Jul-2018   BRCA gene mutation negative 05/04/2018   History of cesarean delivery 02/10/2018   Rh negative state in antepartum period 08/23/2017   History  of eclampsia 07/22/2017   Migraines 04/09/2017   GAD (generalized anxiety disorder) 12/01/2014   Patrina Levering PT, DPT  Ramonita Lab 08/07/2020, 5:33 PM  [facility] [address] [place], [place], [address] Phone: [phone]   Fax:  [phone]  Name: Cartier Mapel MRN: [id number] Date of Birth: 1990/11/25

## 2020-08-09 ENCOUNTER — Encounter: Payer: Self-pay | Admitting: Physical Therapy

## 2020-08-09 ENCOUNTER — Ambulatory Visit: Payer: 59 | Admitting: Physical Therapy

## 2020-08-09 ENCOUNTER — Other Ambulatory Visit: Payer: Self-pay

## 2020-08-09 DIAGNOSIS — M6281 Muscle weakness (generalized): Secondary | ICD-10-CM

## 2020-08-09 DIAGNOSIS — G8929 Other chronic pain: Secondary | ICD-10-CM | POA: Diagnosis not present

## 2020-08-09 DIAGNOSIS — M25511 Pain in right shoulder: Secondary | ICD-10-CM | POA: Diagnosis not present

## 2020-08-09 DIAGNOSIS — M25512 Pain in left shoulder: Secondary | ICD-10-CM | POA: Diagnosis not present

## 2020-08-09 NOTE — Therapy (Signed)
[facility] [address] [place], [place], [address] Phone: [phone]   Fax:  [phone]  Physical Therapy Treatment  Patient Details  Name: Denise Walker MRN: [id number] Date of Birth: [birth year] Referring Provider (PT): Dr. Alba Destine   Encounter Date: 08/09/2020   PT End of Session - 08/09/20 1528     Visit Number 12    Number of Visits 17    Date for PT Re-Evaluation 08/08/20    Authorization Type eval: 06/13/20    PT Start Time 1319    PT Stop Time 1401    PT Time Calculation (min) 42 min    Activity Tolerance Patient tolerated treatment well    Behavior During Therapy [facility] for tasks assessed/performed             Past Medical History:  Diagnosis Date   Anxiety    Back pain    low back pain with lumbar radiculitis   Bilateral shoulder pain    BRCA negative 04/2018   MyRisk neg; IBIS=8.9%/riskscore=6.7%   Eclampsia    Family history of ovarian cancer    Gestational diabetes    Migraine headache    PCOS (polycystic ovarian syndrome)     Past Surgical History:  Procedure Laterality Date   CESAREAN SECTION N/A 07/15/2014   Procedure: CESAREAN SECTION;  Surgeon: Gae Dry, MD;  Location: [facility] ORS;  Service: Obstetrics;  Laterality: N/A;   CESAREAN SECTION N/A 02/10/2018   Procedure: CESAREAN SECTION;  Surgeon: Will Bonnet, MD;  Location: [facility] ORS;  Service: Obstetrics;  Laterality: N/A;   CESAREAN SECTION N/A 12/31/2018   Procedure: CESAREAN SECTION;  Surgeon: [name], MD;  Location: [facility] ORS;  Service: Obstetrics;  Laterality: N/A;   CESAREAN SECTION     HERNIA REPAIR  10/2013   WISDOM TOOTH EXTRACTION      There were no vitals filed for this visit.   Subjective Assessment - 08/09/20 1526     Subjective Pt reports that she is doing pretty good today. She does endorse 2/10 pain upon arrival today. She again mentioned that she is still noticing significant improvement in pain since her  shoulder steroid injection. No specific questions upon arrival.    Pertinent History Pt reports that she started with bilateral shoulder pain in 2020 after her 32 year old was born. She has 34 kids (77 years old and younger with the two younger kids having been born 39 months apart). 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 Pt is L hand dominant. She has received steroid injections in both shoulders and had significant improvement in her R shoulder however her L shoulder has remained painful. She describes her shoulder pain as achy and sore. It is worse with lifting, touch, and reaching overhead with her LUE. Improves with rest, heat, and removing her bra. She also has chronic back pain but has had TFESI injections which have helped significantly. She reports numbness and weakness in her LLE but no LUE numbness or pain.    Limitations Lifting;House hold activities    Diagnostic tests Unable to find record of shoulder imaging    Patient Stated Goals Decrease L shoulder pain when lifting daughters out of crib and when putting them into her car    Currently in Pain? Yes    Pain Score 2     Pain Location Shoulder    Pain Orientation Left    Pain Descriptors / Indicators Sore  Pain Type Chronic pain              TREATMENT     Manual Therapy  Supine L shoulder PROM in all directions; Supine L shoulder A/P mobilization, grade I-II, 30s/bout x 3 bouts; Supine L shoulder distraction mobilizations at different positions through L shoulder abduction, grade I-II, 30s/bout x 3 bouts; Supine L shoulder inferior mobilizations at 90 abduction, grade I-II, 30s/bout x 3 bouts; Supine L shoulder posterior and inferior mobilization at 120 flexion, grade I-II, 30s/bout x 3 bouts;     Ther-ex  Supine L shoulder serratus punch with manual resistance 2 x 10; Supine left shoulder rhythmic stabilization at 90 degrees of flexion and resistance provided at wrist x30  seconds Supine L shoulder flexion to 90 degrees with 3# dumbbell (DB) 2 x 10; R sidelying L shoulder abduction to 90 degrees with 3# DB 2 x 10; R sidelying L shoulder ER with 3# DB 2 x 10; Nautilus bicep curls 30# x 10, 20# x 10 (due to pain); Nautilus rows 30# 2 x 10; Nautilus chest press 40# 2 x 10; Nautilus lat pull down 40# x 10, 50# x 10;     Pt educated throughout session about proper posture and technique with exercises. Improved exercise technique, movement at target joints, use of target muscles after min to mod verbal, visual, tactile cues.      Patient arrives with excellent motivation today. She is continuing to feel the benefits of the steroid injection she received in her left shoulder. Therapy is continuing to utilize therapeutic window while her L shoulder pain is decreased to focus on strengthening for long term management/reduction in pain. There-ex was progressed with increased weight and reps/sets on the Nautilus. Continued with manual techniques for L shoulder pain including low grade glenohumeral mobilizations. Pt is making good progress toward her goal of decreased shoulder pain and improved function.  No HEP progression today. Pt encouraged to follow-up as scheduled. Pt will benefit from PT services to address deficits in L shoulder pain and strength in order to return to full function at home.          PT Short Term Goals - 08/02/20 1155       PT SHORT TERM GOAL #1   Title Pt will be independent with HEP in order to improve shoulder strength and decrease pain in order to improve pain-free function at home and work.    Time 4    Period Weeks    Status On-going    Target Date 07/12/20               PT Long Term Goals - 08/02/20 1156       PT LONG TERM GOAL #1   Title Pt will decrease quick DASH score by at least 8% in order to demonstrate clinically significant reduction in disability.    Baseline 06/13/20: Pt to complete at next visit; 08/02/20: 11.4%     Time 8    Period Weeks    Status On-going    Target Date 08/08/20      PT LONG TERM GOAL #2   Title Pt will decrease worst pain as reported on NPRS by at least 3 points in order to demonstrate clinically significant reduction in pain.    Baseline 06/13/20: worst: 8/10; 08/02/20: 3/10    Time 8    Period Weeks    Status Achieved    Target Date 08/08/20      PT LONG TERM GOAL #  3   Title Pt will be able to lift her daughters into her car and pick them up out of the crib while reporting at worst 2/10 L shoulder pain    Baseline 06/13/20: 8/10 with lifting; 08/02/20: 3/10    Time 8    Period Weeks    Status Partially Met    Target Date 08/08/20      PT LONG TERM GOAL #4   Title Pt will report at least 75% improvement in her L shoulder symptoms in order to resume full responsibilities at home with less pain    Baseline 08/02/20: 70-80% improved    Time 8    Period Weeks    Status Partially Met    Target Date 08/08/20                   Plan - 08/09/20 1528     Clinical Impression Statement Patient arrives with excellent motivation today. She is continuing to feel the benefits of the steroid injection she received in her left shoulder. Therapy is continuing to utilize therapeutic window while her L shoulder pain is decreased to focus on strengthening for long term management/reduction in pain. There-ex was progressed with increased weight and reps/sets on the Nautilus. Continued with manual techniques for L shoulder pain including low grade glenohumeral mobilizations. Pt is making good progress toward her goal of decreased shoulder pain and improved function.  No HEP progression today. Pt encouraged to follow-up as scheduled. Pt will benefit from PT services to address deficits in L shoulder pain and strength in order to return to full function at home.    Personal Factors and Comorbidities Past/Current Experience;Time since onset of injury/illness/exacerbation;Comorbidity 2     Comorbidities Anxiety, migraines    Examination-Activity Limitations Carry;Reach Overhead;Lift;Bathing;Dressing;Caring for Others    Examination-Participation Restrictions Community Activity;Interpersonal Relationship;Meal Prep;Occupation;Shop    Stability/Clinical Decision Making Evolving/Moderate complexity    Rehab Potential Good    PT Frequency 2x / week    PT Duration 8 weeks    PT Treatment/Interventions ADLs/Self Care Home Management;Aquatic Therapy;Biofeedback;Canalith Repostioning;Cryotherapy;Electrical Stimulation;Iontophoresis 63m/ml Dexamethasone;Moist Heat;Traction;Ultrasound;DME Instruction;Gait training;Stair training;Functional mobility training;Therapeutic activities;Therapeutic exercise;Balance training;Neuromuscular re-education;Cognitive remediation;Patient/family education;Manual techniques;Passive range of motion;Dry needling;Vestibular;Spinal Manipulations;Joint Manipulations    PT Next Visit Plan thoracic and cervical mobs, posture education, L upper quarter STM, TDN, initiate light strengthening/stretching    PT Home Exercise Plan Access Code: [email or web address]   Consulted and Agree with Plan of Care Patient             Patient will benefit from skilled therapeutic intervention in order to improve the following deficits and impairments:  Hypomobility, Pain, Decreased strength  Visit Diagnosis: Chronic left shoulder pain  Muscle weakness (generalized)     Problem List Patient Active Problem List   Diagnosis Date Noted   Moderate episode of recurrent major depressive disorder ([place] 06/29/2020   High risk medication use 06/29/2020   History of ADHD 06/29/2020   Uterine scar from previous cesarean delivery affecting pregnancy 12/31/2018   Anti-D antibodies present during pregnancy 02020-06-05  BRCA gene mutation negative 05/04/2018   History of cesarean delivery 02/10/2018   Rh negative state in antepartum period 08/23/2017   History of eclampsia 07/22/2017    Migraines 04/09/2017   GAD (generalized anxiety disorder) 12/01/2014   KPatrina LeveringPT, DPT  KRamonita Lab6/29/2022, 3:33 PM  [place] [facility] [place] [address]Phone: [phone]  Fax:  [phone]  Name: Denise Walker MRN: [id number] Date of Birth: 1990-03-11

## 2020-08-10 ENCOUNTER — Ambulatory Visit: Payer: Medicaid Other | Admitting: Obstetrics and Gynecology

## 2020-08-10 ENCOUNTER — Ambulatory Visit: Payer: 59 | Admitting: Cardiology

## 2020-08-11 ENCOUNTER — Ambulatory Visit (INDEPENDENT_AMBULATORY_CARE_PROVIDER_SITE_OTHER): Payer: 59 | Admitting: Licensed Clinical Social Worker

## 2020-08-11 ENCOUNTER — Other Ambulatory Visit: Payer: Self-pay

## 2020-08-11 DIAGNOSIS — F331 Major depressive disorder, recurrent, moderate: Secondary | ICD-10-CM | POA: Diagnosis not present

## 2020-08-11 DIAGNOSIS — F411 Generalized anxiety disorder: Secondary | ICD-10-CM | POA: Diagnosis not present

## 2020-08-11 NOTE — Progress Notes (Signed)
Comprehensive Clinical Assessment (CCA) Note  08/11/2020 Denise Walker [id number]  Chief Complaint: Establish care  Visit Diagnosis:  MDD, recurrent, moderate GAD     Denise Walker is a 30 year old female presenting for evaluation of continuing depression and anxiety symptoms. Patient reports that currently her mood is stable, but she needs some help managing stress and anxiety. Patient states that she doesn't really feel there are external stressors in life right now, but she just has the internal depression and internal anxiety. Reviewed anxiety and panic attack management strategies using deep breathing. Patient states that she's tried deep breathing in the past and it wasn't very helpful. Encouraged patient to continue trying and to watch heart rate as she is utilizing her coping skills to see the heartbeat go down. Patient states that she has three daughters ages 61, 44, and one. One of her children has some health related concerns, and she has been managing these help issues well. Patient states that initially her depression started after the birth of her first daughter. 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 Patient states that she received inpatient treatment at [facility] for the postpartum depression. Patient reports that she has a lot of shame about this, and is afraid that CPS will come and take her children. Patient reports a husband is a good support system, and that they have a good relationship. Patient enjoys her work that she does [place]  CCA Biopsychosocial Intake/Chief Complaint:  No data recorded Current Symptoms/Problems: depression, anxiety   Patient Reported Schizophrenia/Schizoaffective Diagnosis in Past: No   Strengths: creative  Preferences: outpatient psychiatric supports  Abilities: creative; enjoys crafts   Type of Services Patient Feels are Needed: medication management; counstling   Initial Clinical Notes/Concerns: No data recorded  Mental  Health Symptoms Depression:   Change in energy/activity; Difficulty Concentrating; Fatigue; Sleep (too much or little); Increase/decrease in appetite; Hopelessness; Worthlessness; Irritability (days where high energy and days of low energy. sometimes a decrease in appetite)   Duration of Depressive symptoms:  Greater than two weeks   Mania:   Change in energy/activity; Racing thoughts; Irritability; Increased Energy (racing thoughts at night.  irritability)   Anxiety:    Difficulty concentrating; Fatigue; Worrying; Tension; Sleep; Irritability; Restlessness (thought i was having a heart attack; history of panic attacks. taken lexapro, zoloft, seroquel,)   Psychosis:   None   Duration of Psychotic symptoms: No data recorded  Trauma:   None   Obsessions:   None   Compulsions:   None   Inattention:   Avoids/dislikes activities that require focus; Does not seem to listen; Disorganized; Poor follow-through on tasks; Loses things; Forgetful; Does not follow instructions (not oppositional); Fails to pay attention/makes careless mistakes   Hyperactivity/Impulsivity:   Feeling of restlessness; Difficulty waiting turn; Several symptoms present in 2 of more settings   Oppositional/Defiant Behaviors:   Argumentative (picks fights with husband sometimes)   Emotional Irregularity:   Mood lability   Other Mood/Personality Symptoms:  No data recorded   Mental Status Exam Appearance and self-care  Stature:   Average   Weight:   Overweight   Clothing:   Neat/clean   Grooming:   Normal   Cosmetic use:   Age appropriate   Posture/gait:   Normal   Motor activity:   Not Remarkable   Sensorium  Attention:   Normal   Concentration:   Normal   Orientation:   X5   Recall/memory:   Normal   Affect and Mood  Affect:   Appropriate   Mood:   Anxious; Depressed   Relating  Eye contact:   Normal   Facial expression:   Responsive   Attitude toward examiner:    Cooperative   Thought and Language  Speech flow:  Clear and Coherent   Thought content:   Appropriate to Mood and Circumstances   Preoccupation:   None   Hallucinations:   None   Organization:  No data recorded  [employer] of Knowledge:   Good   Intelligence:   Average   Abstraction:   Normal   Judgement:   Good   Reality Testing:   Realistic   Insight:   Good   Decision Making:   Normal   Social Functioning  Social Maturity:   Responsible   Social Judgement:   Normal   Stress  Stressors:   Other (Comment) (generalized anxiety)   Coping Ability:   Normal   Skill Deficits:   None   Supports:   Family     Religion:    Leisure/Recreation: Leisure / Recreation Do You Have Hobbies?: Yes Leisure and Hobbies: crafting/etsy  Exercise/Diet: Exercise/Diet Do You Exercise?: No Have You Gained or Lost A Significant Amount of Weight in the Past Six Months?: No Do You Follow a Special Diet?: Yes Type of Diet: low carb Do You Have Any Trouble Sleeping?: No   CCA Employment/Education Employment/Work Situation: Employment / Work Situation 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)   CCA Substance Use Alcohol/Drug Use: Alcohol / Drug Use Pain Medications: see MAR Prescriptions: see MAR Over the Counter: see MAR History of alcohol / drug use?: No history of alcohol / drug abuse     ASAM's:  Six Dimensions of Multidimensional Assessment  Dimension 1:  Acute Intoxication and/or Withdrawal Potential:      Dimension 2:  Biomedical Conditions and Complications:      Dimension 3:  Emotional, Behavioral, or Cognitive Conditions and Complications:     Dimension 4:  Readiness to Change:     Dimension 5:  Relapse, Continued use, or Continued Problem Potential:     Dimension 6:  Recovery/Living Environment:     ASAM Severity Score:    ASAM Recommended Level of Treatment:     Substance use Disorder (SUD)     Recommendations for Services/Supports/Treatments: Recommendations for Services/Supports/Treatments Recommendations For Services/Supports/Treatments: Medication Management, Individual Therapy  DSM5 Diagnoses: Patient Active Problem List   Diagnosis Date Noted   Moderate episode of recurrent major depressive disorder ([place]) 06/29/2020   High risk medication use 06/29/2020   History of ADHD 06/29/2020   Uterine scar from previous cesarean delivery affecting pregnancy 12/31/2018   Anti-D antibodies present during pregnancy 2018-07-29   BRCA gene mutation negative 05/04/2018   History of cesarean delivery 02/10/2018   Rh negative state in antepartum period 08/23/2017   History of eclampsia 07/22/2017   Migraines 04/09/2017   GAD (generalized anxiety disorder) 12/01/2014    Patient Centered Plan: Patient is on the following Treatment Plan(s):  Anxiety and Depression   Referrals to Alternative Service(s): Referred to Alternative Service(s):   Place:   Date:   Time:    Referred to Alternative Service(s):   Place:   Date:   Time:    Referred to Alternative Service(s):   Place:   Date:   Time:    Referred to Alternative Service(s):   Place:   Date:   Time:     Rachel Bo Patrick Salemi, LCSW

## 2020-08-16 ENCOUNTER — Ambulatory Visit: Payer: Medicaid Other

## 2020-08-17 ENCOUNTER — Other Ambulatory Visit: Payer: Self-pay | Admitting: Psychiatry

## 2020-08-17 DIAGNOSIS — F331 Major depressive disorder, recurrent, moderate: Secondary | ICD-10-CM

## 2020-08-18 NOTE — Addendum Note (Signed)
Addended by: Ria Comment D on: 08/18/2020 04:32 PM   Modules accepted: Orders

## 2020-08-21 ENCOUNTER — Other Ambulatory Visit: Payer: Self-pay

## 2020-08-21 ENCOUNTER — Ambulatory Visit: Payer: Medicaid Other | Attending: Physical Medicine & Rehabilitation

## 2020-08-21 DIAGNOSIS — M6281 Muscle weakness (generalized): Secondary | ICD-10-CM | POA: Diagnosis present

## 2020-08-21 DIAGNOSIS — G8929 Other chronic pain: Secondary | ICD-10-CM | POA: Diagnosis present

## 2020-08-21 DIAGNOSIS — M25512 Pain in left shoulder: Secondary | ICD-10-CM | POA: Diagnosis present

## 2020-08-21 NOTE — Therapy (Signed)
[facility] [address] [place], [place], [address] Phone: [phone]   Fax:  [phone]  Physical Therapy Treatment    Patient Details  Name: Denise Walker MRN: [id number] Date of Birth: 02-15-1990 Referring Provider (PT): Dr. Alba Destine   Encounter Date: 08/21/2020   PT End of Session - 08/02/20 1700     Visit Number 10    Number of Visits 33   Date for PT Re-Evaluation 09/27/20    Authorization Type eval: 02/14/46, Recertification 1/85/63   PT Start Time 1149    PT Stop Time 1230    PT Time Calculation (min) 41 min    Activity Tolerance Patient tolerated treatment well    Behavior During Therapy [facility] for tasks assessed/performed             Past Medical History:  Diagnosis Date   Anxiety    Back pain    low back pain with lumbar radiculitis   Bilateral shoulder pain    BRCA negative 04/2018   MyRisk neg; IBIS=8.9%/riskscore=6.7%   Eclampsia    Family history of ovarian cancer    Gestational diabetes    Migraine headache    PCOS (polycystic ovarian syndrome)     Past Surgical History:  Procedure Laterality Date   CESAREAN SECTION N/A 07/15/2014   Procedure: CESAREAN SECTION;  Surgeon: Gae Dry, MD;  Location: [facility] ORS;  Service: Obstetrics;  Laterality: N/A;   CESAREAN SECTION N/A 02/10/2018   Procedure: CESAREAN SECTION;  Surgeon: Will Bonnet, MD;  Location: [facility];  Service: Obstetrics;  Laterality: N/A;   CESAREAN SECTION N/A 12/31/2018   Procedure: CESAREAN SECTION;  Surgeon: [name], MD;  Location: [facility] ORS;  Service: Obstetrics;  Laterality: N/A;   CESAREAN SECTION     HERNIA REPAIR  10/2013   WISDOM TOOTH EXTRACTION      There were no vitals filed for this visit.   Subjective Assessment - 08/21/20 2043     Subjective Pt reports that she is doing well today. She denies any L shoulder pain upon arrival today. She continues to have intermittent L shoulder pain but it is  significantly improved since her injection. No specific questions upon arrival.    Pertinent History Pt reports that she started with bilateral shoulder pain in 2020 after her 32 year old was born. She has 32 kids (32 years old and younger with the two younger kids having been born 66 months apart). 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 Pt is L hand dominant. She has received steroid injections in both shoulders and had significant improvement in her R shoulder however her L shoulder has remained painful. She describes her shoulder pain as achy and sore. It is worse with lifting, touch, and reaching overhead with her LUE. Improves with rest, heat, and removing her bra. She also has chronic back pain but has had TFESI injections which have helped significantly. She reports numbness and weakness in her LLE but no LUE numbness or pain.    Limitations Lifting;House hold activities    Diagnostic tests Unable to find record of shoulder imaging    Patient Stated Goals Decrease L shoulder pain when lifting daughters out of crib and when putting them into her car                  TREATMENT     Manual Therapy  Supine L shoulder PROM in all directions; Supine L shoulder  A/P mobilization, grade I-II, 30s/bout x 2 bouts; Supine L shoulder distraction mobilizations at different positions through L shoulder abduction, grade I-II, 30s/bout x 2 bouts; Supine L shoulder inferior mobilizations at 90 abduction, grade I-II, 30s/bout x 2 bouts; Supine L shoulder posterior and inferior mobilization at 120 flexion, grade I-II, 30s/bout x 2 bouts;     Ther-ex  Supine L shoulder serratus punch with manual resistance 2 x 10; Supine left shoulder rhythmic stabilization at 90 degrees of flexion and resistance provided at wrist x 30 seconds Supine L shoulder flexion to 90 degrees with 3# dumbbell (DB) 2 x 10; R sidelying L shoulder abduction to 90 degrees with 3# DB 2 x  10; R sidelying L shoulder ER with 3# DB 2 x 10; Supine L shoulder circles CW/CCW x 10 each direction; Seated L shoulder flexion, scaption, and abduction to 90 degrees with 3# DB 2 x 10 each; Nautilus L single arm lat pull down 60# 2 x 10; Nautilus rows 40# 2 x 10; Wall push-up 2 x 10; L shoulder lateral walk-outs with shoulder at neutral and 90 elbow flexion, green tband, x 10 each direction;    Pt educated throughout session about proper posture and technique with exercises. Improved exercise technique, movement at target joints, use of target muscles after min to mod verbal, visual, tactile cues.      Patient arrives with excellent motivation today. She is continuing to feel the benefits of the steroid injection she received in her left shoulder. Therapy is continuing to utilize therapeutic window while her L shoulder pain is decreased to focus on strengthening for long term management/reduction in pain. There-ex was progressed and new exercises added to session today. Continued with manual techniques for L shoulder pain including low grade glenohumeral mobilizations. Pt is making good progress toward her goal of decreased shoulder pain and improved function.  No HEP progression today. Pt encouraged to follow-up as scheduled. Pt will benefit from PT services to address deficits in L shoulder pain and strength in order to return to full function at home.                       PT Short Term Goals - 08/02/20 1155       PT SHORT TERM GOAL #1   Title Pt will be independent with HEP in order to improve shoulder strength and decrease pain in order to improve pain-free function at home and work.    Time 4    Period Weeks    Status On-going    Target Date 08/30/20               PT Long Term Goals - 08/02/20 1156       PT LONG TERM GOAL #1   Title Pt will decrease quick DASH score by at least 8% in order to demonstrate clinically significant reduction in disability.     Baseline 06/13/20: Pt to complete at next visit; 08/02/20: 11.4%    Time 8    Period Weeks    Status On-going    Target Date 09/27/20      PT LONG TERM GOAL #2   Title Pt will decrease worst pain as reported on NPRS by at least 3 points in order to demonstrate clinically significant reduction in pain.    Baseline 06/13/20: worst: 8/10; 08/02/20: 3/10    Time 8    Period Weeks    Status Achieved    Target Date  PT LONG TERM GOAL #3   Title Pt will be able to lift her daughters into her car and pick them up out of the crib while reporting at worst 2/10 L shoulder pain    Baseline 06/13/20: 8/10 with lifting; 08/02/20: 3/10    Time 8    Period Weeks    Status Partially Met    Target Date 09/27/20      PT LONG TERM GOAL #4   Title Pt will report at least 75% improvement in her L shoulder symptoms in order to resume full responsibilities at home with less pain    Baseline 08/02/20: 70-80% improved    Time 8    Period Weeks    Status Partially Met    Target Date 09/27/20                   Plan - 08/21/20 2045     Clinical Impression Statement Patient arrives with excellent motivation today. She is continuing to feel the benefits of the steroid injection she received in her left shoulder. Therapy is continuing to utilize therapeutic window while her L shoulder pain is decreased to focus on strengthening for long term management/reduction in pain. There-ex was progressed and new exercises added to session today. Continued with manual techniques for L shoulder pain including low grade glenohumeral mobilizations. Pt is making good progress toward her goal of decreased shoulder pain and improved function.  No HEP progression today. Pt encouraged to follow-up as scheduled. Pt will benefit from PT services to address deficits in L shoulder pain and strength in order to return to full function at home.    Personal Factors and Comorbidities Past/Current Experience;Time since onset of  injury/illness/exacerbation;Comorbidity 2    Comorbidities Anxiety, migraines    Examination-Activity Limitations Carry;Reach Overhead;Lift;Bathing;Dressing;Caring for Others    Examination-Participation Restrictions Community Activity;Interpersonal Relationship;Meal Prep;Occupation;Shop    Stability/Clinical Decision Making Evolving/Moderate complexity    Rehab Potential Good    PT Frequency 2x / week    PT Duration 8 weeks    PT Treatment/Interventions ADLs/Self Care Home Management;Aquatic Therapy;Biofeedback;Canalith Repostioning;Cryotherapy;Electrical Stimulation;Iontophoresis 80m/ml Dexamethasone;Moist Heat;Traction;Ultrasound;DME Instruction;Gait training;Stair training;Functional mobility training;Therapeutic activities;Therapeutic exercise;Balance training;Neuromuscular re-education;Cognitive remediation;Patient/family education;Manual techniques;Passive range of motion;Dry needling;Vestibular;Spinal Manipulations;Joint Manipulations    PT Next Visit Plan thoracic and cervical mobs, posture education, L upper quarter STM, TDN, initiate light strengthening/stretching    PT Home Exercise Plan Access Code: [email or web address]   Consulted and Agree with Plan of Care Patient             Patient will benefit from skilled therapeutic intervention in order to improve the following deficits and impairments:  Hypomobility, Pain, Decreased strength  Visit Diagnosis: Chronic left shoulder pain  Muscle weakness (generalized)     Problem List Patient Active Problem List   Diagnosis Date Noted   Moderate episode of recurrent major depressive disorder ([place] 06/29/2020   High risk medication use 06/29/2020   History of ADHD 06/29/2020   Uterine scar from previous cesarean delivery affecting pregnancy 12/31/2018   Anti-D antibodies present during pregnancy 02020/06/15  BRCA gene mutation negative 05/04/2018   History of cesarean delivery 02/10/2018   Rh negative state in antepartum period  08/23/2017   History of eclampsia 07/22/2017   Migraines 04/09/2017   GAD (generalized anxiety disorder) 12/01/2014   JLyndel SafeHuprich PT, DPT, GCS  Ahnya Akre 08/21/2020, 8:55 PM  [facility] [place] [address]Phone: [phone]  Fax:  [phone]  Name: Brigitte Soderberg MRN: [id number] Date of Birth: 08-24-1990

## 2020-08-24 ENCOUNTER — Other Ambulatory Visit: Payer: Self-pay

## 2020-08-24 ENCOUNTER — Ambulatory Visit: Payer: Medicaid Other

## 2020-08-24 DIAGNOSIS — G8929 Other chronic pain: Secondary | ICD-10-CM

## 2020-08-24 DIAGNOSIS — M6281 Muscle weakness (generalized): Secondary | ICD-10-CM

## 2020-08-24 DIAGNOSIS — M25512 Pain in left shoulder: Secondary | ICD-10-CM | POA: Diagnosis not present

## 2020-08-24 NOTE — Therapy (Signed)
[facility] [address] [place], [place], [address] Phone: [phone]   Fax:  [phone]  Physical Therapy Treatment  Patient Details  Name: Denise Walker MRN: [id number] Date of Birth: 1990-12-17 Referring Provider (PT): Dr. Alba Destine   Encounter Date: 08/24/2020   PT End of Session - 08/24/20 1543     Visit Number 14    Number of Visits 33    Date for PT Re-Evaluation 09/27/20    Authorization Type eval: 06/13/20    PT Start Time 1535    PT Stop Time 1615    PT Time Calculation (min) 40 min    Activity Tolerance Patient tolerated treatment well    Behavior During Therapy [facility] for tasks assessed/performed             Past Medical History:  Diagnosis Date   Anxiety    Back pain    low back pain with lumbar radiculitis   Bilateral shoulder pain    BRCA negative 04/2018   MyRisk neg; IBIS=8.9%/riskscore=6.7%   Eclampsia    Family history of ovarian cancer    Gestational diabetes    Migraine headache    PCOS (polycystic ovarian syndrome)     Past Surgical History:  Procedure Laterality Date   CESAREAN SECTION N/A 07/15/2014   Procedure: CESAREAN SECTION;  Surgeon: Gae Dry, MD;  Location: [facility];  Service: Obstetrics;  Laterality: N/A;   CESAREAN SECTION N/A 02/10/2018   Procedure: CESAREAN SECTION;  Surgeon: Will Bonnet, MD;  Location: [facility];  Service: Obstetrics;  Laterality: N/A;   CESAREAN SECTION N/A 12/31/2018   Procedure: CESAREAN SECTION;  Surgeon: [name], MD;  Location: [facility];  Service: Obstetrics;  Laterality: N/A;   CESAREAN SECTION     HERNIA REPAIR  10/2013   WISDOM TOOTH EXTRACTION      There were no vitals filed for this visit.   Subjective Assessment - 08/24/20 1543     Subjective Pt reports that she is doing well today. She denies any L shoulder pain upon arrival today. She has been having some mild low back pain which she rates as 1/10 upon arrival. No specific  questions upon arrival.    Pertinent History Pt reports that she started with bilateral shoulder pain in 2020 after her 32 year old was born. She has 34 kids (37 years old and younger with the two younger kids having been born 72 months apart). 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 Pt is L hand dominant. She has received steroid injections in both shoulders and had significant improvement in her R shoulder however her L shoulder has remained painful. She describes her shoulder pain as achy and sore. It is worse with lifting, touch, and reaching overhead with her LUE. Improves with rest, heat, and removing her bra. She also has chronic back pain but has had TFESI injections which have helped significantly. She reports numbness and weakness in her LLE but no LUE numbness or pain.    Limitations Lifting;House hold activities    Diagnostic tests Unable to find record of shoulder imaging    Patient Stated Goals Decrease L shoulder pain when lifting daughters out of crib and when putting them into her car                 TREATMENT     Manual Therapy  Supine L shoulder PROM in all directions; Supine L shoulder A/P mobilization, grade  I-II, 30s/bout x 2 bouts; Supine L shoulder distraction mobilizations at different positions through L shoulder abduction, grade I-II, 30s/bout x 2 bouts; Supine L shoulder inferior mobilizations at 90 abduction, grade I-II, 30s/bout x 2 bouts; Supine L shoulder posterior and inferior mobilization at 120 flexion, grade I-II, 30s/bout x 2 bouts;     Ther-ex  Supine L shoulder serratus punch with manual resistance 2 x 10; Supine left shoulder rhythmic stabilization at 90 degrees of flexion and resistance provided at wrist x 30 seconds Supine L shoulder flexion to 90 degrees with 3# dumbbell (DB) 2 x 10; R sidelying L shoulder abduction to 90 degrees with 3# DB 2 x 10; R sidelying L shoulder ER with 3# DB 2 x 10; Supine  L shoulder circles CW/CCW x 10 each direction; Prone L shoulder extension, low row, high row, horizontal abduction with 2# DB 2 x 10 each; Prone left shoulder flexion for low trap strengthening 2x10 with light manual resistance from therapist; Standing overhead ball circles on wall with left upper extremity clockwise/counterclockwise 2x10 each;     Pt educated throughout session about proper posture and technique with exercises. Improved exercise technique, movement at target joints, use of target muscles after min to mod verbal, visual, tactile cues.      Patient arrives with excellent motivation today. Therapy is continuing to utilize therapeutic window while her L shoulder pain is decreased to focus on strengthening for long term management/reduction in pain. Continued with manual techniques for L shoulder mobility including low grade glenohumeral mobilizations.  Added additional prone strengthening to therapy session today.  Patient does show decreased endurance in left periscapular and shoulder muscles.  Pt is making good progress toward her goal of decreased shoulder pain and improved function.  No HEP progression today. Pt encouraged to follow-up as scheduled. Pt will benefit from PT services to address deficits in L shoulder pain and strength in order to return to full function at home.                              PT Short Term Goals - 08/21/20 2046       PT SHORT TERM GOAL #1   Title Pt will be independent with HEP in order to improve shoulder strength and decrease pain in order to improve pain-free function at home and work.    Time 4    Period Weeks    Status On-going    Target Date 08/30/20               PT Long Term Goals - 08/21/20 2046       PT LONG TERM GOAL #1   Title Pt will decrease quick DASH score by at least 8% in order to demonstrate clinically significant reduction in disability.    Baseline 06/13/20: Pt to complete at next visit;  08/02/20: 11.4%    Time 8    Period Weeks    Status On-going    Target Date 09/27/20      PT LONG TERM GOAL #2   Title Pt will decrease worst pain as reported on NPRS by at least 3 points in order to demonstrate clinically significant reduction in pain.    Baseline 06/13/20: worst: 8/10; 08/02/20: 3/10    Time 8    Period Weeks    Status Achieved      PT LONG TERM GOAL #3   Title Pt will be able to lift  her daughters into her car and pick them up out of the crib while reporting at worst 2/10 L shoulder pain    Baseline 06/13/20: 8/10 with lifting; 08/02/20: 3/10    Time 8    Period Weeks    Status Partially Met    Target Date 09/27/20      PT LONG TERM GOAL #4   Title Pt will report at least 75% improvement in her L shoulder symptoms in order to resume full responsibilities at home with less pain    Baseline 08/02/20: 70-80% improved    Time 8    Period Weeks    Status Achieved                   Plan - 08/24/20 1545     Clinical Impression Statement Patient arrives with excellent motivation today. Therapy is continuing to utilize therapeutic window while her L shoulder pain is decreased to focus on strengthening for long term management/reduction in pain. Continued with manual techniques for L shoulder mobility including low grade glenohumeral mobilizations.  Added additional prone strengthening to therapy session today.  Patient does show decreased endurance in left periscapular and shoulder muscles.  Pt is making good progress toward her goal of decreased shoulder pain and improved function.  No HEP progression today. Pt encouraged to follow-up as scheduled. Pt will benefit from PT services to address deficits in L shoulder pain and strength in order to return to full function at home.    Personal Factors and Comorbidities Past/Current Experience;Time since onset of injury/illness/exacerbation;Comorbidity 2    Comorbidities Anxiety, migraines    Examination-Activity Limitations  Carry;Reach Overhead;Lift;Bathing;Dressing;Caring for Others    Examination-Participation Restrictions Community Activity;Interpersonal Relationship;Meal Prep;Occupation;Shop    Stability/Clinical Decision Making Evolving/Moderate complexity    Rehab Potential Good    PT Frequency 2x / week    PT Duration 8 weeks    PT Treatment/Interventions ADLs/Self Care Home Management;Aquatic Therapy;Biofeedback;Canalith Repostioning;Cryotherapy;Electrical Stimulation;Iontophoresis 72m/ml Dexamethasone;Moist Heat;Traction;Ultrasound;DME Instruction;Gait training;Stair training;Functional mobility training;Therapeutic activities;Therapeutic exercise;Balance training;Neuromuscular re-education;Cognitive remediation;Patient/family education;Manual techniques;Passive range of motion;Dry needling;Vestibular;Spinal Manipulations;Joint Manipulations    PT Next Visit Plan thoracic and cervical mobs, posture education, L upper quarter STM, TDN, initiate light strengthening/stretching    PT Home Exercise Plan Access Code: [email or web address]   Consulted and Agree with Plan of Care Patient             Patient will benefit from skilled therapeutic intervention in order to improve the following deficits and impairments:  Hypomobility, Pain, Decreased strength  Visit Diagnosis: Chronic left shoulder pain  Muscle weakness (generalized)     Problem List Patient Active Problem List   Diagnosis Date Noted   Moderate episode of recurrent major depressive disorder ([place] 06/29/2020   High risk medication use 06/29/2020   History of ADHD 06/29/2020   Uterine scar from previous cesarean delivery affecting pregnancy 12/31/2018   Anti-D antibodies present during pregnancy 006/15/20  BRCA gene mutation negative 05/04/2018   History of cesarean delivery 02/10/2018   Rh negative state in antepartum period 08/23/2017   History of eclampsia 07/22/2017   Migraines 04/09/2017   GAD (generalized anxiety disorder) 12/01/2014    JLyndel SafeHuprich PT, DPT, GCS  Jacorey Donaway 08/25/2020, 9:27 AM  [facility] [place] [address]Phone: [phone]  Fax:  [phone] Name: KShenandoah YeatsMRN: [id number] of Birth: 905/07/1990

## 2020-08-31 ENCOUNTER — Encounter: Payer: Self-pay | Admitting: Psychiatry

## 2020-08-31 ENCOUNTER — Ambulatory Visit: Payer: Medicaid Other

## 2020-08-31 ENCOUNTER — Other Ambulatory Visit: Payer: Self-pay

## 2020-08-31 ENCOUNTER — Telehealth (INDEPENDENT_AMBULATORY_CARE_PROVIDER_SITE_OTHER): Payer: 59 | Admitting: Psychiatry

## 2020-08-31 ENCOUNTER — Telehealth: Payer: Self-pay | Admitting: Psychiatry

## 2020-08-31 DIAGNOSIS — F5105 Insomnia due to other mental disorder: Secondary | ICD-10-CM | POA: Diagnosis not present

## 2020-08-31 DIAGNOSIS — M25512 Pain in left shoulder: Secondary | ICD-10-CM | POA: Diagnosis not present

## 2020-08-31 DIAGNOSIS — F900 Attention-deficit hyperactivity disorder, predominantly inattentive type: Secondary | ICD-10-CM | POA: Insufficient documentation

## 2020-08-31 DIAGNOSIS — F411 Generalized anxiety disorder: Secondary | ICD-10-CM | POA: Diagnosis not present

## 2020-08-31 DIAGNOSIS — M6281 Muscle weakness (generalized): Secondary | ICD-10-CM

## 2020-08-31 DIAGNOSIS — F3341 Major depressive disorder, recurrent, in partial remission: Secondary | ICD-10-CM

## 2020-08-31 DIAGNOSIS — F331 Major depressive disorder, recurrent, moderate: Secondary | ICD-10-CM

## 2020-08-31 DIAGNOSIS — R41840 Attention and concentration deficit: Secondary | ICD-10-CM

## 2020-08-31 DIAGNOSIS — G8929 Other chronic pain: Secondary | ICD-10-CM

## 2020-08-31 MED ORDER — LAMOTRIGINE 200 MG PO TABS: 200.0000 mg | ORAL_TABLET | Freq: Every day | ORAL | 0 refills | Status: DC

## 2020-08-31 MED ORDER — CLONAZEPAM 0.5 MG PO TABS: 0.5000 mg | ORAL_TABLET | ORAL | 0 refills | Status: DC

## 2020-08-31 NOTE — Therapy (Signed)
[place] [facility] [address] [place], [place], [address] Phone: [phone]   Fax:  [phone]  Physical Therapy Treatment  Patient Details  Name: Denise Walker MRN: [id number] Date of Birth: 03-14-1990 Referring Provider (PT): Dr. Alba Destine   Encounter Date: 08/31/2020   PT End of Session - 08/31/20 1540     Visit Number 15    Number of Visits 33    Date for PT Re-Evaluation 09/27/20    Authorization Type eval: 06/13/20    PT Start Time 1535    PT Stop Time 1615    PT Time Calculation (min) 40 min    Activity Tolerance Patient tolerated treatment well    Behavior During Therapy [facility] for tasks assessed/performed             Past Medical History:  Diagnosis Date   Anxiety    Back pain    low back pain with lumbar radiculitis   Bilateral shoulder pain    BRCA negative 04/2018   MyRisk neg; IBIS=8.9%/riskscore=6.7%   Eclampsia    Family history of ovarian cancer    Gestational diabetes    Migraine headache    PCOS (polycystic ovarian syndrome)     Past Surgical History:  Procedure Laterality Date   CESAREAN SECTION N/A 07/15/2014   Procedure: CESAREAN SECTION;  Surgeon: Gae Dry, MD;  Location: [facility] ORS;  Service: Obstetrics;  Laterality: N/A;   CESAREAN SECTION N/A 02/10/2018   Procedure: CESAREAN SECTION;  Surgeon: Will Bonnet, MD;  Location: [facility] ORS;  Service: Obstetrics;  Laterality: N/A;   CESAREAN SECTION N/A 12/31/2018   Procedure: CESAREAN SECTION;  Surgeon: [name], MD;  Location: [facility] ORS;  Service: Obstetrics;  Laterality: N/A;   CESAREAN SECTION     HERNIA REPAIR  10/2013   WISDOM TOOTH EXTRACTION      There were no vitals filed for this visit.   Subjective Assessment - 08/31/20 1538     Subjective Pt reports that she is doing well today. She reports 2/10 L shoulder pain upon arrival. She reports that she was very active over the weekend which may have resulted in the shoulder pain.  She has been feeling more back pain recently. No specific questions upon arrival.    Pertinent History Pt reports that she started with bilateral shoulder pain in 2020 after her 32 year old was born. She has 29 kids (16 years old and younger with the two younger kids having been born 45 months apart). 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 Pt is L hand dominant. She has received steroid injections in both shoulders and had significant improvement in her R shoulder however her L shoulder has remained painful. She describes her shoulder pain as achy and sore. It is worse with lifting, touch, and reaching overhead with her LUE. Improves with rest, heat, and removing her bra. She also has chronic back pain but has had TFESI injections which have helped significantly. She reports numbness and weakness in her LLE but no LUE numbness or pain.    Limitations Lifting;House hold activities    Diagnostic tests Unable to find record of shoulder imaging    Patient Stated Goals Decrease L shoulder pain when lifting daughters out of crib and when putting them into her car                 TREATMENT     Manual Therapy  Supine L shoulder PROM  in all directions; Supine L shoulder A/P mobilization, grade I-II, 30s/bout x 2 bouts; Supine L shoulder distraction mobilizations at different positions through L shoulder abduction, grade I-II, 30s/bout x 2 bouts; Supine L shoulder inferior mobilizations at 90 abduction, grade I-II, 30s/bout x 2 bouts; Supine L shoulder posterior and inferior mobilization at 120 flexion, grade I-II, 30s/bout x 2 bouts;     Ther-ex  Supine L shoulder serratus punch with manual resistance 2 x 10; Supine left shoulder rhythmic stabilization at 90 degrees of flexion and resistance provided at wrist x 30 seconds Supine L shoulder flexion to 90 degrees with 4# dumbbell (DB) 2 x 10; R sidelying L shoulder abduction to 90 degrees with 4# DB 2 x  10; R sidelying L shoulder ER with 4# DB 2 x 10; Prone L shoulder extension, low row, high row, horizontal abduction with 4# DB 2 x 10 each; Prone left shoulder flexion for low trap strengthening 2x10 with light manual resistance from therapist; Seated "W's" with green tband, verbal and tactile cues for scap retraction and to keep elbows at side 2 x 10;    Pt educated throughout session about proper posture and technique with exercises. Improved exercise technique, movement at target joints, use of target muscles after min to mod verbal, visual, tactile cues.      Patient arrives with excellent motivation today. Therapy is continuing to utilize therapeutic window while her L shoulder pain is decreased to focus on strengthening for long term management/reduction in pain. Her L shoulder is slightly more irritable today. Continued with manual techniques for L shoulder mobility including low grade glenohumeral mobilizations. Continued prone strengthening during session today and increased. Pt is making good progress toward her goal of decreased shoulder pain and improved function.  No HEP progression today. Pt encouraged to follow-up as scheduled. Pt will benefit from PT services to address deficits in L shoulder pain and strength in order to return to full function at home.                              PT Short Term Goals - 08/21/20 2046       PT SHORT TERM GOAL #1   Title Pt will be independent with HEP in order to improve shoulder strength and decrease pain in order to improve pain-free function at home and work.    Time 4    Period Weeks    Status On-going    Target Date 08/30/20               PT Long Term Goals - 08/21/20 2046       PT LONG TERM GOAL #1   Title Pt will decrease quick DASH score by at least 8% in order to demonstrate clinically significant reduction in disability.    Baseline 06/13/20: Pt to complete at next visit; 08/02/20: 11.4%    Time 8     Period Weeks    Status On-going    Target Date 09/27/20      PT LONG TERM GOAL #2   Title Pt will decrease worst pain as reported on NPRS by at least 3 points in order to demonstrate clinically significant reduction in pain.    Baseline 06/13/20: worst: 8/10; 08/02/20: 3/10    Time 8    Period Weeks    Status Achieved      PT LONG TERM GOAL #3   Title Pt will be able to  lift her daughters into her car and pick them up out of the crib while reporting at worst 2/10 L shoulder pain    Baseline 06/13/20: 8/10 with lifting; 08/02/20: 3/10    Time 8    Period Weeks    Status Partially Met    Target Date 09/27/20      PT LONG TERM GOAL #4   Title Pt will report at least 75% improvement in her L shoulder symptoms in order to resume full responsibilities at home with less pain    Baseline 08/02/20: 70-80% improved    Time 8    Period Weeks    Status Achieved                   Plan - 08/31/20 1551     Clinical Impression Statement Patient arrives with excellent motivation today. Therapy is continuing to utilize therapeutic window while her L shoulder pain is decreased to focus on strengthening for long term management/reduction in pain. Her L shoulder is slightly more irritable today. Continued with manual techniques for L shoulder mobility including low grade glenohumeral mobilizations. Continued prone strengthening during session today and increased. Pt is making good progress toward her goal of decreased shoulder pain and improved function.  No HEP progression today. Pt encouraged to follow-up as scheduled. Pt will benefit from PT services to address deficits in L shoulder pain and strength in order to return to full function at home.    Personal Factors and Comorbidities Past/Current Experience;Time since onset of injury/illness/exacerbation;Comorbidity 2    Comorbidities Anxiety, migraines    Examination-Activity Limitations Carry;Reach Overhead;Lift;Bathing;Dressing;Caring for  Others    Examination-Participation Restrictions Community Activity;Interpersonal Relationship;Meal Prep;Occupation;Shop    Stability/Clinical Decision Making Evolving/Moderate complexity    Rehab Potential Good    PT Frequency 2x / week    PT Duration 8 weeks    PT Treatment/Interventions ADLs/Self Care Home Management;Aquatic Therapy;Biofeedback;Canalith Repostioning;Cryotherapy;Electrical Stimulation;Iontophoresis 62m/ml Dexamethasone;Moist Heat;Traction;Ultrasound;DME Instruction;Gait training;Stair training;Functional mobility training;Therapeutic activities;Therapeutic exercise;Balance training;Neuromuscular re-education;Cognitive remediation;Patient/family education;Manual techniques;Passive range of motion;Dry needling;Vestibular;Spinal Manipulations;Joint Manipulations    PT Next Visit Plan thoracic and cervical mobs, posture education, L upper quarter STM, TDN, initiate light strengthening/stretching    PT Home Exercise Plan Access Code: [email or web address]   Consulted and Agree with Plan of Care Patient             Patient will benefit from skilled therapeutic intervention in order to improve the following deficits and impairments:  Hypomobility, Pain, Decreased strength  Visit Diagnosis: Chronic left shoulder pain  Muscle weakness (generalized)     Problem List Patient Active Problem List   Diagnosis Date Noted   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   Attention and concentration deficit 08/31/2020   Moderate episode of recurrent major depressive disorder ([place] 06/29/2020   High risk medication use 06/29/2020   History of ADHD 06/29/2020   Uterine scar from previous cesarean delivery affecting pregnancy 12/31/2018   Anti-D antibodies present during pregnancy 007-Jun-2020  BRCA gene mutation negative 05/04/2018   History of cesarean delivery 02/10/2018   Rh negative state in antepartum period 08/23/2017   History of eclampsia 07/22/2017   Migraines 04/09/2017   GAD  (generalized anxiety disorder) 12/01/2014   JLyndel SafeHuprich PT, DPT, GCS  Paulita Licklider 09/01/2020, 12:59 PM  [facility] [place] [address]Phone: [phone]  Fax:  [phone] Name: KMatilda FleigMRN: [id number] of Birth: 9May 07, 1992

## 2020-08-31 NOTE — Progress Notes (Addendum)
Virtual Visit via Video Note  I connected with Denise Walker on 08/31/20 at 10:00 AM EDT by a video enabled telemedicine application and verified that I am speaking with the correct person using two identifiers.  Location Provider Location : [facility] Patient Location : Home  Participants: Patient , Provider   I discussed the limitations of evaluation and management by telemedicine and the availability of in person appointments. The patient expressed understanding and agreed to proceed.    I discussed the assessment and treatment plan with the patient. The patient was provided an opportunity to ask questions and all were answered. The patient agreed with the plan and demonstrated an understanding of the instructions.   The patient was advised to call back or seek an in-person evaluation if the symptoms worsen or if the condition fails to improve as anticipated.    Denise Acres MD OP Progress Note  09/01/2020 8:20 AM Denise Walker  MRN:  [id number]  Chief Complaint:  Chief Complaint   Follow-up; Anxiety; Depression    HPI: Denise Walker is a 30 year old Caucasian female, stay-at-home mom, lives in [place], has a history of MDD, GAD, history of ADHD was evaluated by telemedicine today.  Patient today reports she has noticed improvement in her mood.  She reports one morning she woke up and started feeling better.  She reports that she is able to better control her mood swings.  The Lamictal is working.  She denies side effects.  Patient continues to struggle with anxiety.  She has multiple psychosocial stressors including her child's health problems.  Patient reports her child needs overnight stay at [facility] testing.  This will be beginning of August.  She reports she has gone through this before and has been coping well.  She had her first appointment with her therapist and reports she is motivated to stay in therapy.  Patient continues to struggle with attention and focus problems.  She  was referred for an ADHD testing, pending.  Patient reports sleep is overall okay.  She continues to struggle with her appetite and reports she often forgets about herself.  She has been making an attempt to eat.  Patient denies any suicidality, homicidality or perceptual disturbances.  Patient denies any other concerns today.  Visit Diagnosis:    ICD-10-CM   1. MDD (major depressive disorder), recurrent, in partial remission ([place])  F33.41     2. GAD (generalized anxiety disorder)  F41.1 clonazePAM (KLONOPIN) 0.5 MG tablet    lamoTRIgine (LAMICTAL) 200 MG tablet    3. Insomnia due to mental disorder  F51.05    mood    4. Attention and concentration deficit  R41.840       Past Psychiatric History: Reviewed past psychiatric history from progress note on 06/29/2020.  Past trials of medications- Lexapro Zoloft Celexa Trintellix Concerta Ritalin Wellbutrin  Past Medical History:  Past Medical History:  Diagnosis Date   Anxiety    Back pain    low back pain with lumbar radiculitis   Bilateral shoulder pain    BRCA negative 04/2018   MyRisk neg; IBIS=8.9%/riskscore=6.7%   Eclampsia    Family history of ovarian cancer    Gestational diabetes    Migraine headache    PCOS (polycystic ovarian syndrome)     Past Surgical History:  Procedure Laterality Date   CESAREAN SECTION N/A 07/15/2014   Procedure: CESAREAN SECTION;  Surgeon: Gae Dry, MD;  Location: [facility] ORS;  Service: Obstetrics;  Laterality: N/A;   CESAREAN  SECTION N/A 02/10/2018   Procedure: CESAREAN SECTION;  Surgeon: Will Bonnet, MD;  Location: [facility] ORS;  Service: Obstetrics;  Laterality: N/A;   CESAREAN SECTION N/A 12/31/2018   Procedure: CESAREAN SECTION;  Surgeon: [name], MD;  Location: [facility] ORS;  Service: Obstetrics;  Laterality: N/A;   CESAREAN SECTION     HERNIA REPAIR  10/2013   WISDOM TOOTH EXTRACTION      Family Psychiatric History: Reviewed family psychiatric history from  progress note on 06/29/2020  Family History:  Family History  Problem Relation Age of Onset   Bipolar disorder Mother    Ovarian cancer Mother    Arthritis Mother    Ovarian cancer Maternal Aunt    Diabetes Paternal Aunt    Breast cancer Maternal Grandmother    Diabetes Paternal Grandfather    Uterine cancer Cousin    ADD / ADHD Daughter    ODD Daughter     Social History: Reviewed social history from progress note on 06/29/2020 Social History   Socioeconomic History   Marital status: Married    Spouse name: Einar Pheasant   Number of children: 3   Years of education: high school   Highest education level: Not on file  Occupational History   Not on file  Tobacco Use   Smoking status: Never   Smokeless tobacco: Never  Vaping Use   Vaping Use: Never used  Substance and Sexual Activity   Alcohol use: Yes    Comment: occasional wine cooler   Drug use: Never   Sexual activity: Yes    Birth control/protection: I.U.D.    Comment: will discuss with MD  Other Topics Concern   Not on file  Social History Narrative   Not on file   Social Determinants of Health   Financial Resource Strain: Not on file  Food Insecurity: Not on file  Transportation Needs: Not on file  Physical Activity: Not on file  Stress: Not on file  Social Connections: Not on file    Allergies:  Allergies  Allergen Reactions   Fish Allergy Shortness Of Breath   Shellfish Allergy Shortness Of Breath    Metabolic Disorder Labs: Lab Results  Component Value Date   HGBA1C 5.5 06/08/2020   Lab Results  Component Value Date   PROLACTIN 8.8 03/27/2017   Lab Results  Component Value Date   CHOL 175 06/08/2020   TRIG 189 (H) 06/08/2020   HDL 47 06/08/2020   CHOLHDL 3.7 06/08/2020   LDLCALC 96 06/08/2020   Lab Results  Component Value Date   TSH 4.350 09/22/2019   TSH 2.170 03/27/2017    Therapeutic Level Labs: No results found for: LITHIUM No results found for: VALPROATE No components found  for:  CBMZ  Current Medications: Current Outpatient Medications  Medication Sig Dispense Refill   naproxen (NAPROSYN) 500 MG tablet 1 po bid prn     ARIPiprazole (ABILIFY) 10 MG tablet TAKE 1 TABLET BY MOUTH EVERY DAY 90 tablet 0   Butalbital-APAP-Caffeine 50-325-40 MG capsule Take by mouth.     citalopram (CELEXA) 10 MG tablet TAKE 1 TABLET BY MOUTH EVERY DAY 90 tablet 0   clonazePAM (KLONOPIN) 0.5 MG tablet Take 1 tablet (0.5 mg total) by mouth as directed. Take 1 tablet 1-2 times a week for severe anxiety attacks only 8 tablet 0   cyclobenzaprine (FLEXERIL) 10 MG tablet Take 1 tablet by mouth as needed.     hydrOXYzine (ATARAX/VISTARIL) 25 MG tablet TAKE 1-2 TABLETS (25-50 MG TOTAL) BY  MOUTH DAILY AS NEEDED FOR ANXIETY. 180 tablet 0   lamoTRIgine (LAMICTAL) 200 MG tablet Take 1 tablet (200 mg total) by mouth daily. 90 tablet 0   lamoTRIgine (LAMICTAL) 25 MG tablet Take 1-2 tablets (25-50 mg total) by mouth as directed. Take 1 tablet daily along with 200 mg daily for 2 weeks and increase to 2 tablets daily along with 200 mg 60 tablet 1   levonorgestrel (MIRENA) 20 MCG/24HR IUD by Intrauterine route.     traZODone (DESYREL) 50 MG tablet      No current facility-administered medications for this visit.     Musculoskeletal: Strength & Muscle Tone:  UTA Gait & Station:  UTA Patient leans: N/A  Psychiatric Specialty Exam: Review of Systems  Psychiatric/Behavioral:  Positive for decreased concentration. The patient is nervous/anxious.   All other systems reviewed and are negative.  not currently breastfeeding.There is no height or weight on file to calculate BMI.  General Appearance: Casual  Eye Contact:  Good  Speech:  Clear and Coherent  Volume:  Normal  Mood:  Anxious improving  Affect:  Congruent  Thought Process:  Goal Directed and Descriptions of Associations: Intact  Orientation:  Full (Time, Place, and Person)  Thought Content: Logical   Suicidal Thoughts:  No  Homicidal  Thoughts:  No  Memory:  Immediate;   Fair Recent;   Fair Remote;   Fair  Judgement:  Fair  Insight:  Fair  Psychomotor Activity:  Normal  Concentration:  Concentration: Fair and Attention Span: Fair  Recall:  [employer] of Knowledge: Fair  Language: Fair  Akathisia:  No  Handed:  Left  Denise Walker (if indicated): not done  Assets:  Communication Skills Desire for Improvement Housing Social Support  ADL's:  Intact  Cognition: WNL  Sleep:  Fair   Screenings: GAD-7    Flowsheet Row Video Visit from 08/31/2020 in [place] Video Visit from 06/29/2020 in  Office Visit from 06/08/2020 in [place] Office Visit from 05/04/2020 in [place] Office Visit from 11/18/2019 in [place]  Total GAD-7 Score 14 17 11 14 19       PHQ2-9    Flowsheet Row Video Visit from 08/31/2020 in [place] Video Visit from 07/14/2020 in [place] Video Visit from 06/29/2020 in [place] Visit from 06/08/2020 in [place] Office Visit from 05/04/2020 in [place]  PHQ-2 Total Score 2 6 4 4 2   PHQ-9 Total Score 13 20 13 14 11       Flowsheet Row Counselor from 08/11/2020 in [place] Video Visit from 06/29/2020 in [place] ED from 04/17/2020 in [facility] No Risk No Risk No Risk        Assessment and Plan: Keyna Blizard is a 30 year old Caucasian female, lives in [place], married, has a history of depression, anxiety, PCOS, chronic pain was evaluated by telemedicine today.  Patient is currently making progress with regards to her depression although she continues to have anxiety symptoms.  She does also have concentration problems, continues to have multiple psychosocial stressors.  She  will benefit from medication management, psychotherapy sessions.  Plan as noted below.  Plan MDD-in partial remission Abilify 10 mg p.o. daily Celexa 10 mg p.o. daily Lamictal 250 mg p.o. daily  GAD-unstable Celexa 10 mg p.o. daily Hydroxyzine 25-50 mg p.o. daily  as needed for severe anxiety Klonopin 0.5 mg as needed for severe panic attacks only. Patient to continue CBT with Ms. Christina Hussami We will coordinate care.  Insomnia-improving Trazodone 50 mg p.o. nightly as needed  History of ADHD-attention and concentration problem-patient was referred for ADHD testing-pending I have communicated with staff for status update. I have also provided information for plan: Neuropsychiatric.  I have spent atleast 30 minutes face to face by video with patient today which includes the time spent for preparing to see the patient ( e.g., review of test, records ), ordering medications and test ,psychoeducation and supportive psychotherapy and care coordination,as well as documenting clinical information in electronic health record,interpreting and communication of test results   Follow-up in clinic in 1 month or sooner if needed.This note was generated in part or whole with voice recognition software. Voice recognition is usually quite accurate but there are transcription errors that can and very often do occur. I apologize for any typographical errors that were not detected and corrected.     Ursula Alert, MD 09/01/2020, 8:20 AM

## 2020-08-31 NOTE — Telephone Encounter (Signed)
Called to schedule f/u appt due to pt beibg overbooked. Left detailed message to call and reschedule overbooked appt.

## 2020-09-01 DIAGNOSIS — F5105 Insomnia due to other mental disorder: Secondary | ICD-10-CM | POA: Insufficient documentation

## 2020-09-04 ENCOUNTER — Ambulatory Visit: Payer: Medicaid Other

## 2020-09-04 ENCOUNTER — Other Ambulatory Visit: Payer: Self-pay

## 2020-09-04 DIAGNOSIS — G8929 Other chronic pain: Secondary | ICD-10-CM

## 2020-09-04 DIAGNOSIS — M25512 Pain in left shoulder: Secondary | ICD-10-CM | POA: Diagnosis not present

## 2020-09-04 DIAGNOSIS — M6281 Muscle weakness (generalized): Secondary | ICD-10-CM

## 2020-09-04 NOTE — Therapy (Signed)
[place] [facility] [address] [place], [place], [address] Phone: [phone]   Fax:  [phone]  Physical Therapy Treatment  Patient Details  Name: Denise Walker MRN: [id number] Date of Birth: [birth year] Referring Provider (PT): Dr. Alba Destine   Encounter Date: 09/04/2020   PT End of Session - 09/04/20 1534     Visit Number 16    Number of Visits 33    Date for PT Re-Evaluation 09/27/20    Authorization Type eval: 06/13/20    PT Start Time 1535    PT Stop Time 1615    PT Time Calculation (min) 40 min    Activity Tolerance Patient tolerated treatment well    Behavior During Therapy [facility] for tasks assessed/performed             Past Medical History:  Diagnosis Date   Anxiety    Back pain    low back pain with lumbar radiculitis   Bilateral shoulder pain    BRCA negative 04/2018   MyRisk neg; IBIS=8.9%/riskscore=6.7%   Eclampsia    Family history of ovarian cancer    Gestational diabetes    Migraine headache    PCOS (polycystic ovarian syndrome)     Past Surgical History:  Procedure Laterality Date   CESAREAN SECTION N/A 07/15/2014   Procedure: CESAREAN SECTION;  Surgeon: Gae Dry, MD;  Location: [facility] ORS;  Service: Obstetrics;  Laterality: N/A;   CESAREAN SECTION N/A 02/10/2018   Procedure: CESAREAN SECTION;  Surgeon: Will Bonnet, MD;  Location: [facility];  Service: Obstetrics;  Laterality: N/A;   CESAREAN SECTION N/A 12/31/2018   Procedure: CESAREAN SECTION;  Surgeon: [name], MD;  Location: [facility] OR[facility];  Service: Obstetrics;  Laterality: N/A;   CESAREAN SECTION     HERNIA REPAIR  10/2013   WISDOM TOOTH EXTRACTION      There were no vitals filed for this visit.   Subjective Assessment - 09/04/20 1534     Subjective Pt reports that she is doing well today.  Denies any shoulder pain upon arrival today.  Overall she feels like she is making good progress with the shoulder pain and would like to be able  to maintain.  No specific questions upon arrival.    Pertinent History Pt reports that she started with bilateral shoulder pain in 2020 after her 32 year old was born. She has 65 kids (32 years old and younger with the two younger kids having been born 38 months apart). 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 Pt is L hand dominant. She has received steroid injections in both shoulders and had significant improvement in her R shoulder however her L shoulder has remained painful. She describes her shoulder pain as achy and sore. It is worse with lifting, touch, and reaching overhead with her LUE. Improves with rest, heat, and removing her bra. She also has chronic back pain but has had TFESI injections which have helped significantly. She reports numbness and weakness in her LLE but no LUE numbness or pain.    Limitations Lifting;House hold activities    Diagnostic tests Unable to find record of shoulder imaging    Patient Stated Goals Decrease L shoulder pain when lifting daughters out of crib and when putting them into her car                TREATMENT     Manual Therapy  Supine L shoulder PROM in all directions; Supine  L shoulder A/P mobilization, grade I-II, 30s/bout x 2 bouts; Supine L shoulder distraction mobilizations at different positions through L shoulder abduction, grade I-II, 30s/bout x 2 bouts; Supine L shoulder inferior mobilizations at 90 abduction, grade I-II, 30s/bout x 2 bouts; Supine L shoulder posterior and inferior mobilization at 120 flexion, grade I-II, 30s/bout x 2 bouts;     Ther-ex  Supine L shoulder serratus punch with manual resistance 2 x 10; Supine left shoulder rhythmic stabilization at 90 degrees of flexion and resistance provided at wrist 2 x 30 seconds; Supine chest press with cane and therapist providing resistance 2x10; Seated bilateral shoulder I's, Y's, and T's to 90 degrees with 4# dumbbell (DB) 2 x 10  each; Prone L shoulder extension, low row, high row, horizontal abduction, low trap flexion, and ER (at 90 degrees abduction) all with 4# DB 2 x 10 each; Standing "W's" with green tband, verbal and tactile cues for scap retraction and to keep elbows at side 2 x 10; Standing forearm wall "Y" slides with green tband 2 x 10;    Pt educated throughout session about proper posture and technique with exercises. Improved exercise technique, movement at target joints, use of target muscles after min to mod verbal, visual, tactile cues.      Patient arrives with excellent motivation today. Therapy is continuing to utilize therapeutic window while her L shoulder pain is decreased to focus on strengthening for long term management/reduction in pain.  She denies any left shoulder pain upon arrival or during exercises today.  Continued with manual techniques for L shoulder mobility including low grade glenohumeral mobilizations. Continued prone strengthening during session today and increased dumbbell weights for all exercises today. Pt is making good progress toward her goal of decreased shoulder pain and improved function.  No HEP progression today. Pt encouraged to follow-up as scheduled. Pt will benefit from PT services to address deficits in L shoulder pain and strength in order to return to full function at home.                              PT Short Term Goals - 08/21/20 2046       PT SHORT TERM GOAL #1   Title Pt will be independent with HEP in order to improve shoulder strength and decrease pain in order to improve pain-free function at home and work.    Time 4    Period Weeks    Status On-going    Target Date 08/30/20               PT Long Term Goals - 08/21/20 2046       PT LONG TERM GOAL #1   Title Pt will decrease quick DASH score by at least 8% in order to demonstrate clinically significant reduction in disability.    Baseline 06/13/20: Pt to complete at next  visit; 08/02/20: 11.4%    Time 8    Period Weeks    Status On-going    Target Date 09/27/20      PT LONG TERM GOAL #2   Title Pt will decrease worst pain as reported on NPRS by at least 3 points in order to demonstrate clinically significant reduction in pain.    Baseline 06/13/20: worst: 8/10; 08/02/20: 3/10    Time 8    Period Weeks    Status Achieved      PT LONG TERM GOAL #3   Title Pt  will be able to lift her daughters into her car and pick them up out of the crib while reporting at worst 2/10 L shoulder pain    Baseline 06/13/20: 8/10 with lifting; 08/02/20: 3/10    Time 8    Period Weeks    Status Partially Met    Target Date 09/27/20      PT LONG TERM GOAL #4   Title Pt will report at least 75% improvement in her L shoulder symptoms in order to resume full responsibilities at home with less pain    Baseline 08/02/20: 70-80% improved    Time 8    Period Weeks    Status Achieved                   Plan - 09/04/20 1534     Clinical Impression Statement Patient arrives with excellent motivation today. Therapy is continuing to utilize therapeutic window while her L shoulder pain is decreased to focus on strengthening for long term management/reduction in pain.  She denies any left shoulder pain upon arrival or during exercises today.  Continued with manual techniques for L shoulder mobility including low grade glenohumeral mobilizations. Continued prone strengthening during session today and increased dumbbell weights for all exercises today. Pt is making good progress toward her goal of decreased shoulder pain and improved function.  No HEP progression today. Pt encouraged to follow-up as scheduled. Pt will benefit from PT services to address deficits in L shoulder pain and strength in order to return to full function at home.    Personal Factors and Comorbidities Past/Current Experience;Time since onset of injury/illness/exacerbation;Comorbidity 2    Comorbidities Anxiety,  migraines    Examination-Activity Limitations Carry;Reach Overhead;Lift;Bathing;Dressing;Caring for Others    Examination-Participation Restrictions Community Activity;Interpersonal Relationship;Meal Prep;Occupation;Shop    Stability/Clinical Decision Making Evolving/Moderate complexity    Rehab Potential Good    PT Frequency 2x / week    PT Duration 8 weeks    PT Treatment/Interventions ADLs/Self Care Home Management;Aquatic Therapy;Biofeedback;Canalith Repostioning;Cryotherapy;Electrical Stimulation;Iontophoresis 4mg /ml Dexamethasone;Moist Heat;Traction;Ultrasound;DME Instruction;Gait training;Stair training;Functional mobility training;Therapeutic activities;Therapeutic exercise;Balance training;Neuromuscular re-education;Cognitive remediation;Patient/family education;Manual techniques;Passive range of motion;Dry needling;Vestibular;Spinal Manipulations;Joint Manipulations    PT Next Visit Plan thoracic and cervical mobs, posture education, L upper quarter STM, TDN, initiate light strengthening/stretching    PT Home Exercise Plan Access Code:    Consulted and Agree with Plan of Care Patient             Patient will benefit from skilled therapeutic intervention in order to improve the following deficits and impairments:  Hypomobility, Pain, Decreased strength  Visit Diagnosis: Chronic left shoulder pain  Muscle weakness (generalized)     Problem List Patient Active Problem List   Diagnosis Date Noted   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   Attention and concentration deficit 08/31/2020   Moderate episode of recurrent major depressive disorder (HCC) 06/29/2020   High risk medication use 06/29/2020   History of ADHD 06/29/2020   Uterine scar from previous cesarean delivery affecting pregnancy 12/31/2018   Anti-D antibodies present during pregnancy 2018-07-16   BRCA gene mutation negative 05/04/2018   History of cesarean delivery 02/10/2018   Rh negative state  in antepartum period 08/23/2017   History of eclampsia 07/22/2017   Migraines 04/09/2017   GAD (generalized anxiety disorder) 12/01/2014   12/03/2014 Mychele Seyller PT, DPT, GCS  Sawyer Mentzer 09/05/2020, 10:22 AM  [place] [address] [place], [place], [place] Phone: [phone]  Fax:  [phone]  Name: Denise Walker MRN: [id number] Date of Birth: 08-24-1990

## 2020-09-07 ENCOUNTER — Other Ambulatory Visit: Payer: Self-pay

## 2020-09-07 ENCOUNTER — Ambulatory Visit (INDEPENDENT_AMBULATORY_CARE_PROVIDER_SITE_OTHER): Payer: 59 | Admitting: Obstetrics and Gynecology

## 2020-09-07 ENCOUNTER — Other Ambulatory Visit (HOSPITAL_COMMUNITY)
Admission: RE | Admit: 2020-09-07 | Discharge: 2020-09-07 | Disposition: A | Discharge status: Home / Self Care | Payer: 59 | Source: Ambulatory Visit | Attending: Obstetrics and Gynecology | Admitting: Obstetrics and Gynecology

## 2020-09-07 ENCOUNTER — Encounter: Payer: Self-pay | Admitting: Obstetrics and Gynecology

## 2020-09-07 ENCOUNTER — Ambulatory Visit: Payer: Medicaid Other

## 2020-09-07 VITALS — BP 120/83 | HR 104 | Ht 59.0 in | Wt 208.0 lb

## 2020-09-07 DIAGNOSIS — Z01419 Encounter for gynecological examination (general) (routine) without abnormal findings: Secondary | ICD-10-CM | POA: Diagnosis not present

## 2020-09-07 DIAGNOSIS — M6281 Muscle weakness (generalized): Secondary | ICD-10-CM

## 2020-09-07 DIAGNOSIS — Z124 Encounter for screening for malignant neoplasm of cervix: Secondary | ICD-10-CM

## 2020-09-07 DIAGNOSIS — Z1239 Encounter for other screening for malignant neoplasm of breast: Secondary | ICD-10-CM

## 2020-09-07 DIAGNOSIS — M25512 Pain in left shoulder: Secondary | ICD-10-CM

## 2020-09-07 DIAGNOSIS — G8929 Other chronic pain: Secondary | ICD-10-CM

## 2020-09-07 NOTE — Progress Notes (Signed)
Gynecology Annual Exam   PCP: [name], MD  Chief Complaint:  Chief Complaint  Patient presents with   Gynecologic Exam    Annual - no concerns. RM 3    History of Present Illness: Patient is a 30 y.o. [id number] presents for annual exam. The patient has no complaints today.   LMP: No LMP recorded. (Menstrual status: IUD). Amenorrhea on Mirena IUD  The patient is sexually active. She currently uses IUD for contraception. She denies dyspareunia.  There is no notable family history of breast or ovarian cancer in her family.  The patient wears seatbelts: yes.   The patient has regular exercise: not asked.    The patient reports current symptoms of depression.    Review of Systems: ROS  Past Medical History:  Patient Active Problem List   Diagnosis Date Noted   History of eclampsia 07/22/2017    Priority: Medium    [X]  Aspirin 81 mg daily after 12 weeks; discontinue after 36 weeks  Baseline and surveillance labs (pulled in from [facility], refresh links as needed)  Lab Results  Component Value Date   PLT 292 07/22/2017   CREATININE 0.73 07/22/2017   AST 16 07/22/2017   ALT 13 07/22/2017   PROTCRRATIO 69 07/16/2014        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    mood     Attention and concentration deficit 08/31/2020   Moderate episode of recurrent major depressive disorder ([place]) 06/29/2020   High risk medication use 06/29/2020   History of ADHD 06/29/2020   Uterine scar from previous cesarean delivery affecting pregnancy 12/31/2018   Anti-D antibodies present during pregnancy Jul 17, 2018    Last rhogam from prior pregnancy 02/11/2018 post delivery.  Negative on 7/20 and 9/21.  Rhogam received 9/21     BRCA gene mutation negative 05/04/2018    MyRisk negative 04/15/2018 Remaining lifetime risk 6.7%     History of cesarean delivery 02/10/2018   Rh negative state in antepartum period 08/23/2017   Migraines 04/09/2017   GAD (generalized anxiety disorder)  12/01/2014    Formatting of this note might be different from the original. IMOUPDATE      Past Surgical History:  Past Surgical History:  Procedure Laterality Date   CESAREAN SECTION N/A 07/15/2014   Procedure: CESAREAN SECTION;  Surgeon: Gae Dry, MD;  Location: [facility];  Service: Obstetrics;  Laterality: N/A;   CESAREAN SECTION N/A 02/10/2018   Procedure: CESAREAN SECTION;  Surgeon: Will Bonnet, MD;  Location: [facility];  Service: Obstetrics;  Laterality: N/A;   CESAREAN SECTION N/A 12/31/2018   Procedure: CESAREAN SECTION;  Surgeon: [name], MD;  Location: [facility] OR[facility];  Service: Obstetrics;  Laterality: N/A;   CESAREAN SECTION     HERNIA REPAIR  10/2013   WISDOM TOOTH EXTRACTION      Gynecologic History:  No LMP recorded. (Menstrual status: IUD). Contraception: IUD Mirena 03/18/2019 Last Pap: Results were:07/22/2017 no abnormalities   Obstetric History: [id number]  Family History:  Family History  Problem Relation Age of Onset   Bipolar disorder Mother    Ovarian cancer Mother    Arthritis Mother    Ovarian cancer Maternal Aunt    Diabetes Paternal Aunt    Breast cancer Maternal Grandmother    Diabetes Paternal Grandfather    Uterine cancer Cousin    ADD / ADHD Daughter    ODD Daughter     Social History:  Social History   Socioeconomic History  Marital status: Married    Spouse name: Einar Pheasant   Number of children: 3   Years of education: high school   Highest education level: Not on file  Occupational History   Not on file  Tobacco Use   Smoking status: Never   Smokeless tobacco: Never  Vaping Use   Vaping Use: Never used  Substance and Sexual Activity   Alcohol use: Yes    Comment: occasional wine cooler   Drug use: Never   Sexual activity: Yes    Birth control/protection: I.U.D.    Comment: will discuss with MD  Other Topics Concern   Not on file  Social History Narrative   Not on file   Social Determinants of Health    Financial Resource Strain: Not on file  Food Insecurity: Not on file  Transportation Needs: Not on file  Physical Activity: Not on file  Stress: Not on file  Social Connections: Not on file  Intimate Partner Violence: Not on file    Allergies:  Allergies  Allergen Reactions   Fish Allergy Shortness Of Breath   Shellfish Allergy Shortness Of Breath    Medications: Prior to Admission medications   Medication Sig Start Date End Date Taking? Authorizing Provider  ARIPiprazole (ABILIFY) 10 MG tablet TAKE 1 TABLET BY MOUTH EVERY DAY 08/17/20   Norman Clay, MD  Butalbital-APAP-Caffeine [phone] MG capsule Take by mouth. 10/11/19   [provider]  citalopram (CELEXA) 10 MG tablet TAKE 1 TABLET BY MOUTH EVERY DAY 07/24/20   Ursula Alert, MD  clonazePAM (KLONOPIN) 0.5 MG tablet Take 1 tablet (0.5 mg total) by mouth as directed. Take 1 tablet 1-2 times a week for severe anxiety attacks only 08/31/20   Ursula Alert, MD  cyclobenzaprine (FLEXERIL) 10 MG tablet Take 1 tablet by mouth as needed. 04/16/20   [provider]  hydrOXYzine (ATARAX/VISTARIL) 25 MG tablet TAKE 1-2 TABLETS (25-50 MG TOTAL) BY MOUTH DAILY AS NEEDED FOR ANXIETY. 07/24/20   Ursula Alert, MD  lamoTRIgine (LAMICTAL) 200 MG tablet Take 1 tablet (200 mg total) by mouth daily. 08/31/20   Ursula Alert, MD  lamoTRIgine (LAMICTAL) 25 MG tablet Take 1-2 tablets (25-50 mg total) by mouth as directed. Take 1 tablet daily along with 200 mg daily for 2 weeks and increase to 2 tablets daily along with 200 mg 07/26/20   Ursula Alert, MD  levonorgestrel (MIRENA) 20 MCG/24HR IUD by Intrauterine route.    [provider]  naproxen (NAPROSYN) 500 MG tablet 1 po bid prn 07/26/20   [provider]  traZODone (DESYREL) 50 MG tablet  10/04/19   [provider]    Physical Exam Vitals: Blood pressure 120/83, pulse (!) 104, height 4' 11"  (1.499 m), weight 208 lb (94.3 kg), not currently  breastfeeding.  General: NAD HEENT: normocephalic, anicteric Thyroid: no enlargement, no palpable nodules Pulmonary: No increased work of breathing, CTAB Cardiovascular: RRR, distal pulses 2+ Breast: Breast symmetrical, no tenderness, no palpable nodules or masses, no skin or nipple retraction present, no nipple discharge.  No axillary or supraclavicular lymphadenopathy. Abdomen: NABS, soft, non-tender, non-distended.  Umbilicus without lesions.  No hepatomegaly, splenomegaly or masses palpable. No evidence of hernia  Genitourinary:  External: Normal external female genitalia.  Normal urethral meatus, normal Bartholin's and Skene's glands.    Vagina: Normal vaginal mucosa, no evidence of prolapse.    Cervix: Grossly normal in appearance, no bleeding, IUD strings visualized  Uterus: Non-enlarged, mobile, normal contour.  No CMT  Adnexa: ovaries non-enlarged,  no adnexal masses  Rectal: deferred  Lymphatic: no evidence of inguinal lymphadenopathy Extremities: no edema, erythema, or tenderness Neurologic: Grossly intact Psychiatric: mood appropriate, affect full  Female chaperone present for pelvic and breast  portions of the physical exam    Assessment: 30 y.o. [id number] routine annual exam  Plan: Problem List Items Addressed This Visit   None Visit Diagnoses     Encounter for gynecological examination without abnormal finding    -  Primary   Screening for malignant neoplasm of cervix       Relevant Orders   Cytology - PAP (Completed)   Breast screening           1) STI screening  was notoffered and therefore not obtained  2)  ASCCP guidelines and rational discussed.  Patient opts for every 3 years screening interval  3) Contraception - the patient is currently using  IUD.  She is happy with her current form of contraception and plans to continue  4) Routine healthcare maintenance including cholesterol, diabetes screening discussed managed by PCP  5) Return in about 1 year  (around 09/07/2021) for annual.   [name], MD, [place], [facility] 09/07/2020, 2:58 PM

## 2020-09-08 NOTE — Therapy (Signed)
[facility] [address] [place], [place], [address] Phone: [phone]   Fax:  [phone]  Physical Therapy Treatment  Patient Details  Name: Denise Walker MRN: [id number] Date of Birth: 1990-12-16 Referring Provider (PT): Dr. Alba Destine   Encounter Date: 09/07/2020   PT End of Session - 09/08/20 0809     Visit Number 17    Number of Visits 33    Date for PT Re-Evaluation 09/27/20    Authorization Type eval: 06/13/20    PT Start Time 1537    PT Stop Time 1615    PT Time Calculation (min) 38 min    Activity Tolerance Patient tolerated treatment well    Behavior During Therapy [facility] for tasks assessed/performed             Past Medical History:  Diagnosis Date   Anxiety    Back pain    low back pain with lumbar radiculitis   Bilateral shoulder pain    BRCA negative 04/2018   MyRisk neg; IBIS=8.9%/riskscore=6.7%   Eclampsia    Family history of ovarian cancer    Gestational diabetes    Migraine headache    PCOS (polycystic ovarian syndrome)     Past Surgical History:  Procedure Laterality Date   CESAREAN SECTION N/A 07/15/2014   Procedure: CESAREAN SECTION;  Surgeon: Gae Dry, MD;  Location: [facility] ORS;  Service: Obstetrics;  Laterality: N/A;   CESAREAN SECTION N/A 02/10/2018   Procedure: CESAREAN SECTION;  Surgeon: Will Bonnet, MD;  Location: [facility];  Service: Obstetrics;  Laterality: N/A;   CESAREAN SECTION N/A 12/31/2018   Procedure: CESAREAN SECTION;  Surgeon: [name], MD;  Location: [facility] OR[facility];  Service: Obstetrics;  Laterality: N/A;   CESAREAN SECTION     HERNIA REPAIR  10/2013   WISDOM TOOTH EXTRACTION      There were no vitals filed for this visit.   Subjective Assessment - 09/08/20 0809     Subjective Pt reports that she is doing well today.  Denies any shoulder pain upon arrival today. No changes since last therapy session. No specific questions upon arrival.    Pertinent History Pt  reports that she started with bilateral shoulder pain in 2020 after her 32 year old was born. She has 61 kids (74 years old and younger with the two younger kids having been born 42 months apart). 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 Pt is L hand dominant. She has received steroid injections in both shoulders and had significant improvement in her R shoulder however her L shoulder has remained painful. She describes her shoulder pain as achy and sore. It is worse with lifting, touch, and reaching overhead with her LUE. Improves with rest, heat, and removing her bra. She also has chronic back pain but has had TFESI injections which have helped significantly. She reports numbness and weakness in her LLE but no LUE numbness or pain.    Limitations Lifting;House hold activities    Diagnostic tests Unable to find record of shoulder imaging    Patient Stated Goals Decrease L shoulder pain when lifting daughters out of crib and when putting them into her car                TREATMENT     Manual Therapy  Supine L shoulder PROM in all directions; Supine L shoulder A/P mobilization, grade I-II, 30s/bout x 2 bouts; Supine L shoulder distraction mobilizations at different  positions through L shoulder abduction, grade I-II, 30s/bout x 1 bout; Supine L shoulder inferior mobilizations at 90 abduction, grade I-II, 30s/bout x 1 bout; Supine L shoulder posterior and inferior mobilization at 120 flexion, grade I-II, 30s/bout x 1 bout;     Ther-ex  Supine L shoulder serratus punch with manual resistance 2 x 10; Supine left shoulder rhythmic stabilization at 90 degrees of flexion and resistance provided at wrist 2 x 30 seconds; Supine L shoulder flexion with 4# dumbbell (DB) 2 x 10; Supine L shoulder circles at 90 flexion with 4# DB 2 x 10 each direction; R sidelying L shoulder abduction with 4# DB 2 x 10; R sidelying L shoulder ER with 4# DB 2 x 10; Seated  bilateral shoulder I's, Y's, and T's to 90 degrees, started with 4# DB but due to pain regressed to 2# DB LUE only 2 x 10 each direction; Seated chest press and row with cane and therapist providing resistance 2x10 each;    Pt educated throughout session about proper posture and technique with exercises. Improved exercise technique, movement at target joints, use of target muscles after min to mod verbal, visual, tactile cues.      Patient arrives with excellent motivation today. Therapy is continuing to utilize therapeutic window while her L shoulder pain is decreased to focus on strengthening for long term management/reduction in pain. She reports mild pain during seated flexion, scaption and abduction with 4# dumbbell so regressed to 2# dumbbell which relieves pain. Continued with manual techniques for L shoulder mobility including low grade glenohumeral mobilizations. Pt is making good progress toward her goal of decreased shoulder pain and improved function.  No HEP progression today. Pt encouraged to follow-up as scheduled. Discussed plan of care regarding frequency of visits moving forward and will discuss more during next session. Pt will benefit from PT services to address deficits in L shoulder pain and strength in order to return to full function at home.                              PT Short Term Goals - 08/21/20 2046       PT SHORT TERM GOAL #1   Title Pt will be independent with HEP in order to improve shoulder strength and decrease pain in order to improve pain-free function at home and work.    Time 4    Period Weeks    Status On-going    Target Date 08/30/20               PT Long Term Goals - 08/21/20 2046       PT LONG TERM GOAL #1   Title Pt will decrease quick DASH score by at least 8% in order to demonstrate clinically significant reduction in disability.    Baseline 06/13/20: Pt to complete at next visit; 08/02/20: 11.4%    Time 8     Period Weeks    Status On-going    Target Date 09/27/20      PT LONG TERM GOAL #2   Title Pt will decrease worst pain as reported on NPRS by at least 3 points in order to demonstrate clinically significant reduction in pain.    Baseline 06/13/20: worst: 8/10; 08/02/20: 3/10    Time 8    Period Weeks    Status Achieved      PT LONG TERM GOAL #3   Title Pt will be able to  lift her daughters into her car and pick them up out of the crib while reporting at worst 2/10 L shoulder pain    Baseline 06/13/20: 8/10 with lifting; 08/02/20: 3/10    Time 8    Period Weeks    Status Partially Met    Target Date 09/27/20      PT LONG TERM GOAL #4   Title Pt will report at least 75% improvement in her L shoulder symptoms in order to resume full responsibilities at home with less pain    Baseline 08/02/20: 70-80% improved    Time 8    Period Weeks    Status Achieved                   Plan - 09/08/20 0810     Clinical Impression Statement Patient arrives with excellent motivation today. Therapy is continuing to utilize therapeutic window while her L shoulder pain is decreased to focus on strengthening for long term management/reduction in pain. She reports mild pain during seated flexion, scaption and abduction with 4# dumbbell so regressed to 2# dumbbell which relieves pain. Continued with manual techniques for L shoulder mobility including low grade glenohumeral mobilizations. Pt is making good progress toward her goal of decreased shoulder pain and improved function.  No HEP progression today. Pt encouraged to follow-up as scheduled. Discussed plan of care regarding frequency of visits moving forward and will discuss more during next session. Pt will benefit from PT services to address deficits in L shoulder pain and strength in order to return to full function at home.    Personal Factors and Comorbidities Past/Current Experience;Time since onset of injury/illness/exacerbation;Comorbidity 2     Comorbidities Anxiety, migraines    Examination-Activity Limitations Carry;Reach Overhead;Lift;Bathing;Dressing;Caring for Others    Examination-Participation Restrictions Community Activity;Interpersonal Relationship;Meal Prep;Occupation;Shop    Stability/Clinical Decision Making Evolving/Moderate complexity    Rehab Potential Good    PT Frequency 2x / week    PT Duration 8 weeks    PT Treatment/Interventions ADLs/Self Care Home Management;Aquatic Therapy;Biofeedback;Canalith Repostioning;Cryotherapy;Electrical Stimulation;Iontophoresis 36m/ml Dexamethasone;Moist Heat;Traction;Ultrasound;DME Instruction;Gait training;Stair training;Functional mobility training;Therapeutic activities;Therapeutic exercise;Balance training;Neuromuscular re-education;Cognitive remediation;Patient/family education;Manual techniques;Passive range of motion;Dry needling;Vestibular;Spinal Manipulations;Joint Manipulations    PT Next Visit Plan thoracic and cervical mobs, posture education, L upper quarter STM, TDN, initiate light strengthening/stretching    PT Home Exercise Plan Access Code: [email or web address]   Consulted and Agree with Plan of Care Patient             Patient will benefit from skilled therapeutic intervention in order to improve the following deficits and impairments:  Hypomobility, Pain, Decreased strength  Visit Diagnosis: Chronic left shoulder pain  Muscle weakness (generalized)     Problem List Patient Active Problem List   Diagnosis Date Noted   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   Attention and concentration deficit 08/31/2020   Moderate episode of recurrent major depressive disorder ([place] 06/29/2020   High risk medication use 06/29/2020   History of ADHD 06/29/2020   Uterine scar from previous cesarean delivery affecting pregnancy 12/31/2018   Anti-D antibodies present during pregnancy 0June 10, 2020  BRCA gene mutation negative 05/04/2018   History of cesarean delivery 02/10/2018    Rh negative state in antepartum period 08/23/2017   History of eclampsia 07/22/2017   Migraines 04/09/2017   GAD (generalized anxiety disorder) 12/01/2014   JLyndel SafeHuprich PT, DPT, GCS  Lakechia Nay 09/08/2020, 8:33 AM  [place] [facility] [place]  [place], [address] Phone: [phone]   Fax:  [phone]  Name: Denise Walker MRN: [id number] Date of Birth: [birth year]

## 2020-09-11 ENCOUNTER — Other Ambulatory Visit: Payer: Self-pay

## 2020-09-11 ENCOUNTER — Ambulatory Visit: Payer: 59 | Attending: Physical Medicine & Rehabilitation

## 2020-09-11 ENCOUNTER — Encounter: Payer: Self-pay | Admitting: Psychology

## 2020-09-11 DIAGNOSIS — M6281 Muscle weakness (generalized): Secondary | ICD-10-CM | POA: Diagnosis not present

## 2020-09-11 DIAGNOSIS — M25512 Pain in left shoulder: Secondary | ICD-10-CM | POA: Insufficient documentation

## 2020-09-11 DIAGNOSIS — G8929 Other chronic pain: Secondary | ICD-10-CM | POA: Diagnosis not present

## 2020-09-11 NOTE — Patient Instructions (Signed)
Access Code: [email or web address] URL: [email or web address] Date: 09/11/2020 Prepared by: Ria Comment  Exercises Seated Gentle Upper Trapezius Stretch - 2 x daily - 7 x weekly - 30s x 3 bilateral hold Seated Cervical Sidebending Stretch - 2 x daily - 7 x weekly - 3 x 30s bilateral hold Seated Scapular Retraction - 1 x daily - 7 x weekly - 2 sets - 10 reps - 3s hold Standing Row with Resistance - 1 x daily - 7 x weekly - 2 sets - 10 reps - 3s hold Standing Shoulder Flexion with Resistance - 1 x daily - 7 x weekly - 2 sets - 10 reps - 3s hold Standing Single Arm Shoulder Abduction with Resistance - 1 x daily - 7 x weekly - 2 sets - 10 reps - 3s hold Shoulder W - External Rotation with Resistance - 1 x daily - 7 x weekly - 2 sets - 10 reps - 3s hold Shoulder External Rotation with Anchored Resistance - 1 x daily - 7 x weekly - 2 sets - 10 reps - 3s hold Standing Shoulder Internal Rotation with Anchored Resistance - 1 x daily - 7 x weekly - 2 sets - 10 reps - 3s hold Correct Seated Posture - 7 x weekly

## 2020-09-11 NOTE — Therapy (Signed)
[facility] [address] [place], [place], [address] Phone: [phone]   Fax:  [phone]  Physical Therapy Treatment/Discharge  Patient Details  Name: Denise Walker MRN: [id number] Date of Birth: [birth year] Referring Provider (PT): Dr. Alba Destine   Encounter Date: 09/11/2020   PT End of Session - 09/11/20 1549     Visit Number 18    Number of Visits 33    Date for PT Re-Evaluation 09/27/20    Authorization Type eval: 06/13/20    PT Start Time 1530    PT Stop Time 1615    PT Time Calculation (min) 45 min    Activity Tolerance Patient tolerated treatment well    Behavior During Therapy [facility] for tasks assessed/performed             Past Medical History:  Diagnosis Date   Anxiety    Back pain    low back pain with lumbar radiculitis   Bilateral shoulder pain    BRCA negative 04/2018   MyRisk neg; IBIS=8.9%/riskscore=6.7%   Eclampsia    Family history of ovarian cancer    Gestational diabetes    Migraine headache    PCOS (polycystic ovarian syndrome)     Past Surgical History:  Procedure Laterality Date   CESAREAN SECTION N/A 07/15/2014   Procedure: CESAREAN SECTION;  Surgeon: Gae Dry, MD;  Location: [facility];  Service: Obstetrics;  Laterality: N/A;   CESAREAN SECTION N/A 02/10/2018   Procedure: CESAREAN SECTION;  Surgeon: Will Bonnet, MD;  Location: [facility];  Service: Obstetrics;  Laterality: N/A;   CESAREAN SECTION N/A 12/31/2018   Procedure: CESAREAN SECTION;  Surgeon: [name], MD;  Location: [facility] OR[facility];  Service: Obstetrics;  Laterality: N/A;   CESAREAN SECTION     HERNIA REPAIR  10/2013   WISDOM TOOTH EXTRACTION      There were no vitals filed for this visit.   Subjective Assessment - 09/11/20 1535     Subjective Pt reports that she is doing well today. Reports 1-2/10 L shoulder pain upon arrival today from activity over the weekend. No changes since last therapy session. She feels like  she is ready to discahrge and continue her HEP independently. No specific questions upon arrival.    Pertinent History Pt reports that she started with bilateral shoulder pain in 2020 after her 32 year old was born. She has 7 kids (8 years old and younger with the two younger kids having been born 40 months apart). 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 Pt is L hand dominant. She has received steroid injections in both shoulders and had significant improvement in her R shoulder however her L shoulder has remained painful. She describes her shoulder pain as achy and sore. It is worse with lifting, touch, and reaching overhead with her LUE. Improves with rest, heat, and removing her bra. She also has chronic back pain but has had TFESI injections which have helped significantly. She reports numbness and weakness in her LLE but no LUE numbness or pain.    Limitations Lifting;House hold activities    Diagnostic tests Unable to find record of shoulder imaging    Patient Stated Goals Decrease L shoulder pain when lifting daughters out of crib and when putting them into her car                TREATMENT     Manual Therapy  Supine L shoulder PROM in all directions;  Supine L shoulder A/P mobilization, grade I-II, 30s/bout x 2 bouts; Supine L shoulder distraction mobilizations at different positions through L shoulder abduction, grade I-II, 30s/bout x 1 bout; Supine L shoulder inferior mobilizations at 90 abduction, grade I-II, 30s/bout x 1 bout; Supine L shoulder posterior and inferior mobilization at 120 flexion, grade I-II, 30s/bout x 1 bout;     Ther-ex  Updated outcome measures and goals with patient; QuickDASH: 4.5% (11.4%); FOTO: 75 Worst pain: 3/10; Worst pain with lifting kids: 3/10;  Supine L shoulder serratus punch with manual resistance 2 x 10; Supine left shoulder rhythmic stabilization at 90 degrees of flexion and resistance provided  at wrist 2 x 30 seconds; Supine L shoulder flexion with 5# dumbbell (DB) 2 x 10; Supine L shoulder circles at 90 flexion with 5# DB 2 x 10 each direction; Standing L shoulder flexion and abduction to 90 degrees, started with green tband 2 x 10 each; Standing rows with blue tband 2 x 10; HEP progressed and handout provided as well as additional therabands.     Pt educated throughout session about proper posture and technique with exercises. Improved exercise technique, movement at target joints, use of target muscles after min to mod verbal, visual, tactile cues.      Updated outcome measures and goals with patient today. Her QuickDASH is 4.5% indicating low disability related to her L shoulder pain. Her worst L shoulder pain has decreased from 8/10 initially to 3/10 today and she is able to lift her daughters into their car seats with significantly less pain. Overall she reports approximately 85% improvement in symptoms since starting therapy. Her FOTO increased from 54 at initial evaluation to 75 today. Pt reports she is ready for discharge so provided her updated HEP to continue independently.  Patient encouraged to follow-up if her left shoulder pain worsens or if she is unable to make continued progress.                                                 PT Short Term Goals - 09/12/20 3329       PT SHORT TERM GOAL #1   Title Pt will be independent with HEP in order to improve shoulder strength and decrease pain in order to improve pain-free function at home and work.    Time 4    Period Weeks    Status Achieved    Target Date 08/30/20               PT Long Term Goals - 09/12/20 0918       PT LONG TERM GOAL #1   Title Pt will decrease quick DASH score by at least 8% in order to demonstrate clinically significant reduction in disability.    Baseline 06/13/20: Pt to complete at next visit; 08/02/20: 11.4%; 09/12/20: 4.5%    Time 8    Period Weeks     Status Partially Met      PT LONG TERM GOAL #2   Title Pt will decrease worst pain as reported on NPRS by at least 3 points in order to demonstrate clinically significant reduction in pain.    Baseline 06/13/20: worst: 8/10; 08/02/20: 3/10; 09/12/20: 3/10    Time 8    Period Weeks    Status Achieved      PT LONG TERM GOAL #3  Title Pt will be able to lift her daughters into her car and pick them up out of the crib while reporting at worst 2/10 L shoulder pain    Baseline 06/13/20: 8/10 with lifting; 08/02/20: 3/10; 09/12/20: 3/10;    Time 8    Period Weeks    Status Partially Met      PT LONG TERM GOAL #4   Title Pt will report at least 75% improvement in her L shoulder symptoms in order to resume full responsibilities at home with less pain    Baseline 08/02/20: 70-80% improved; 09/12/20: 85%    Time 8    Period Weeks    Status Achieved                   Plan - 09/11/20 1549     Clinical Impression Statement Updated outcome measures and goals with patient today. Her QuickDASH is 4.5% indicating low disability related to her L shoulder pain. Her worst L shoulder pain has decreased from 8/10 initially to 3/10 today and she is able to lift her daughters into their car seats with significantly less pain. Overall she reports approximately 85% improvement in symptoms since starting therapy. Her FOTO increased from 54 at initial evaluation to 75 today. Pt reports she is ready for discharge so provided her updated HEP to continue independently.  Patient encouraged to follow-up if her left shoulder pain worsens or if she is unable to make continued progress.    Personal Factors and Comorbidities Past/Current Experience;Time since onset of injury/illness/exacerbation;Comorbidity 2    Comorbidities Anxiety, migraines    Examination-Activity Limitations Carry;Reach Overhead;Lift;Bathing;Dressing;Caring for Others    Examination-Participation Restrictions Community Activity;Interpersonal  Relationship;Meal Prep;Occupation;Shop    Stability/Clinical Decision Making Evolving/Moderate complexity    Rehab Potential Good    PT Frequency 2x / week    PT Duration 8 weeks    PT Treatment/Interventions ADLs/Self Care Home Management;Aquatic Therapy;Biofeedback;Canalith Repostioning;Cryotherapy;Electrical Stimulation;Iontophoresis 42m/ml Dexamethasone;Moist Heat;Traction;Ultrasound;DME Instruction;Gait training;Stair training;Functional mobility training;Therapeutic activities;Therapeutic exercise;Balance training;Neuromuscular re-education;Cognitive remediation;Patient/family education;Manual techniques;Passive range of motion;Dry needling;Vestibular;Spinal Manipulations;Joint Manipulations    PT Next Visit Plan thoracic and cervical mobs, posture education, L upper quarter STM, TDN, initiate light strengthening/stretching    PT Home Exercise Plan Access Code: [email or web address]   Consulted and Agree with Plan of Care Patient             Patient will benefit from skilled therapeutic intervention in order to improve the following deficits and impairments:  Hypomobility, Pain, Decreased strength  Visit Diagnosis: Chronic left shoulder pain  Muscle weakness (generalized)     Problem List Patient Active Problem List   Diagnosis Date Noted   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   Attention and concentration deficit 08/31/2020   Moderate episode of recurrent major depressive disorder ([place] 06/29/2020   High risk medication use 06/29/2020   History of ADHD 06/29/2020   Uterine scar from previous cesarean delivery affecting pregnancy 12/31/2018   Anti-D antibodies present during pregnancy 021-Jun-2020  BRCA gene mutation negative 05/04/2018   History of cesarean delivery 02/10/2018   Rh negative state in antepartum period 08/23/2017   History of eclampsia 07/22/2017   Migraines 04/09/2017   GAD (generalized anxiety disorder) 12/01/2014   JLyndel SafeHuprich PT, DPT, GCS   Leland Staszewski 09/12/2020, 9:21 AM  [place] [facility] [place] [address]Phone: [phone]  Fax:  [phone] Name: KNormalee SistareMRN: [id number] of Birth: 905/14/1992

## 2020-09-12 ENCOUNTER — Ambulatory Visit: Payer: 59 | Admitting: Cardiovascular Disease

## 2020-09-13 ENCOUNTER — Ambulatory Visit: Payer: 59

## 2020-09-13 LAB — CYTOLOGY - PAP
Adequacy: ABSENT
Comment: NEGATIVE
Diagnosis: NEGATIVE
High risk HPV: NEGATIVE

## 2020-09-22 ENCOUNTER — Other Ambulatory Visit: Payer: Self-pay

## 2020-09-22 ENCOUNTER — Ambulatory Visit (INDEPENDENT_AMBULATORY_CARE_PROVIDER_SITE_OTHER): Payer: 59 | Admitting: Licensed Clinical Social Worker

## 2020-09-22 DIAGNOSIS — F411 Generalized anxiety disorder: Secondary | ICD-10-CM | POA: Diagnosis not present

## 2020-09-22 DIAGNOSIS — F3341 Major depressive disorder, recurrent, in partial remission: Secondary | ICD-10-CM

## 2020-09-22 NOTE — Progress Notes (Signed)
Virtual Visit via Video Note  I connected with [name] on 09/22/20 at 10:00 AM EDT by a video enabled telemedicine application and verified that I am speaking with the correct person using two identifiers.  Location: Patient: home Provider: remote office [place], [place])   I discussed the limitations of evaluation and management by telemedicine and the availability of in person appointments. The patient expressed understanding and agreed to proceed.  I discussed the assessment and treatment plan with the patient. The patient was provided an opportunity to ask questions and all were answered. The patient agreed with the plan and demonstrated an understanding of the instructions.   The patient was advised to call back or seek an in-person evaluation if the symptoms worsen or if the condition fails to improve as anticipated.  I provided 30 minutes of non-face-to-face time during this encounter.   Varun Jourdan R Danean Marner, LCSW   THERAPIST PROGRESS NOTE  Session Time: 10-10:30a  Participation Level: Active  Behavioral Response: NeatAlertAnxious  Type of Therapy: Individual Therapy  Treatment Goals addressed: Anxiety  Interventions: Solution Focused  Summary: Nadalyn Deringer is a 30 y.o. female who presents with improving symptoms related to depression diagnosis. Patient reports overall mood is stable and that she is managing situational stressors well. Patient reports good quality and quantity of sleep. Allowed patient safe space to explore and express thoughts and feelings associated with recent external stressors and life events. Patient reports that she's excited to take her children to the water park today. Patient reports that she is being intentional about self-care. Patient reports that she has a new job two nights per week that patient feels helps her feel more imbalance. Patient is trying to schedule times to spend with friends. Patient reports that psychiatrist recommended that  she follow through with ADHD testing. Patient is excited that she had an appointment in February that has since been moved to September. Explored patients experience with ADHD in the past as a child. Patient is reporting positive relationships with friends and family members. Patient is happy that youngest daughter got some good medical reports from tests earlier this week. Continued recommendations are as follows: self care behaviors, positive social engagements, focusing on overall work/home/life balance, and focusing on positive physical and emotional wellness.   Suicidal/Homicidal: No  Therapist Response: Tresa Endo is able to verbalize and understanding of how thoughts comma feelings comma and behaviors contribute to anxiety and its treatment period patient can verbalize and understanding of the role that fearful thinking plays in creating fears comma excessive worry comma and persistent anxiety symptoms period patient is able to identify useful coping skill, and continue its use. Patient is able to utilize behavioral strategies to overcome depression symptoms period these behaviors are reflective of continuing personal growth and progress. Treatment to continue as indicated.  Plan: Return again in 4 weeks.  Diagnosis: Axis I: MDD, recurrent,in partial remission    Axis II: No diagnosis    Ernest Haber Nieshia Larmon, LCSW 09/22/2020

## 2020-09-25 ENCOUNTER — Ambulatory Visit: Payer: 59

## 2020-10-02 ENCOUNTER — Other Ambulatory Visit: Payer: Self-pay

## 2020-10-02 ENCOUNTER — Encounter: Payer: Self-pay | Admitting: Psychiatry

## 2020-10-02 ENCOUNTER — Telehealth (HOSPITAL_BASED_OUTPATIENT_CLINIC_OR_DEPARTMENT_OTHER): Payer: 59 | Admitting: Psychiatry

## 2020-10-02 DIAGNOSIS — R41840 Attention and concentration deficit: Secondary | ICD-10-CM

## 2020-10-02 DIAGNOSIS — F3341 Major depressive disorder, recurrent, in partial remission: Secondary | ICD-10-CM | POA: Insufficient documentation

## 2020-10-02 DIAGNOSIS — F411 Generalized anxiety disorder: Secondary | ICD-10-CM

## 2020-10-02 DIAGNOSIS — G2402 Drug induced acute dystonia: Secondary | ICD-10-CM

## 2020-10-02 DIAGNOSIS — F5105 Insomnia due to other mental disorder: Secondary | ICD-10-CM

## 2020-10-02 DIAGNOSIS — G2589 Other specified extrapyramidal and movement disorders: Secondary | ICD-10-CM

## 2020-10-02 DIAGNOSIS — F3342 Major depressive disorder, recurrent, in full remission: Secondary | ICD-10-CM | POA: Diagnosis not present

## 2020-10-02 DIAGNOSIS — T50905A Adverse effect of unspecified drugs, medicaments and biological substances, initial encounter: Secondary | ICD-10-CM

## 2020-10-02 HISTORY — DX: Drug induced acute dystonia: G24.02

## 2020-10-02 MED ORDER — BENZTROPINE MESYLATE 0.5 MG PO TABS: 0.5000 mg | ORAL_TABLET | Freq: Every day | ORAL | 1 refills | Status: DC | PRN

## 2020-10-02 NOTE — Progress Notes (Signed)
Virtual Visit via Video Note  I connected with Denise Walker on 10/02/20 at  3:00 PM EDT by a video enabled telemedicine application and verified that I am speaking with the correct person using two identifiers.  Location Provider Location : [facility] Patient Location : Home  Participants: Patient , Provider   I discussed the limitations of evaluation and management by telemedicine and the availability of in person appointments. The patient expressed understanding and agreed to proceed.    I discussed the assessment and treatment plan with the patient. The patient was provided an opportunity to ask questions and all were answered. The patient agreed with the plan and demonstrated an understanding of the instructions.   The patient was advised to call back or seek an in-person evaluation if the symptoms worsen or if the condition fails to improve as anticipated.   [facility] MD OP Progress Note  10/03/2020 4:28 PM Denise Walker  MRN:  [id number]  Chief Complaint:  Chief Complaint   Follow-up; Depression; Anxiety    HPI: Denise Walker is a 30 year old Caucasian female, stay-at-home mom, lives in [facility], has a history of MDD, GAD, history of ADHD was evaluated by telemedicine today.  Patient today reports she has noticed improvement in her anxiety.  She reports her child's health problems are currently better, had the testing and surgery done and she is currently making progress, has started reaching all her milestones, completed feeding therapy.  This helps a lot.  Patient reports sleep as good.  She however reports she had an episode recently when she had a nightmare or a vivid dream where she acted out in her dream.  She may have used trazodone that night and uses it only as needed.  Unknown if trazodone is contributing to her nightmares.  She reports she does have muscle twitches of her facial muscles on and off a few times a month.  It does not distress her much however she reports she is  interested in starting a medication as needed for the same.  Discussed benztropine.  She denies any suicidality, homicidality or perceptual disturbances.  Patient reports she continues to be in psychotherapy sessions and reports therapy sessions as beneficial.  She continues to have attention and focus problems, reports she has upcoming appointment for ADHD testing coming up in September.  Patient denies any other concerns.  Visit Diagnosis:    ICD-10-CM   1. MDD (major depressive disorder), recurrent, in full remission ([place])  F33.42     2. GAD (generalized anxiety disorder)  F41.1     3. Insomnia due to mental disorder  F51.05    mood    4. 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  Past trials of medications like Lexapro, Zoloft, Celexa, Trintellix, Concerta, Ritalin, Wellbutrin  Past Medical History:  Past Medical History:  Diagnosis Date   Anxiety    Back pain    low back pain with lumbar radiculitis   Bilateral shoulder pain    BRCA negative 04/2018   MyRisk neg; IBIS=8.9%/riskscore=6.7%   Eclampsia    Family history of ovarian cancer    Gestational diabetes    Migraine headache    PCOS (polycystic ovarian syndrome)     Past Surgical History:  Procedure Laterality Date  CESAREAN SECTION N/A 07/15/2014   Procedure: CESAREAN SECTION;  Surgeon: Gae Dry, MD;  Location: [facility] ORS;  Service: Obstetrics;  Laterality: N/A;   CESAREAN SECTION N/A 02/10/2018   Procedure: CESAREAN SECTION;  Surgeon: Will Bonnet, MD;  Location: [facility] OR[facility];  Service: Obstetrics;  Laterality: N/A;   CESAREAN SECTION N/A 12/31/2018   Procedure: CESAREAN SECTION;  Surgeon: [name], MD;  Location: [facility] ORS;  Service: Obstetrics;  Laterality: N/A;    CESAREAN SECTION     HERNIA REPAIR  10/2013   WISDOM TOOTH EXTRACTION      Family Psychiatric History: Reviewed family psychiatric history from progress note on 06/29/2020  Family History:  Family History  Problem Relation Age of Onset   Bipolar disorder Mother    Ovarian cancer Mother    Arthritis Mother    Ovarian cancer Maternal Aunt    Diabetes Paternal Aunt    Breast cancer Maternal Grandmother    Diabetes Paternal Grandfather    Uterine cancer Cousin    ADD / ADHD Daughter    ODD Daughter     Social History: Reviewed social history from progress note on 06/29/2020 Social History   Socioeconomic History   Marital status: Married    Spouse name: Einar Pheasant   Number of children: 3   Years of education: high school   Highest education level: Not on file  Occupational History   Not on file  Tobacco Use   Smoking status: Never   Smokeless tobacco: Never  Vaping Use   Vaping Use: Never used  Substance and Sexual Activity   Alcohol use: Yes    Comment: occasional wine cooler   Drug use: Never   Sexual activity: Yes    Birth control/protection: I.U.D.    Comment: will discuss with MD  Other Topics Concern   Not on file  Social History Narrative   Not on file   Social Determinants of Health   Financial Resource Strain: Not on file  Food Insecurity: Not on file  Transportation Needs: Not on file  Physical Activity: Not on file  Stress: Not on file  Social Connections: Not on file    Allergies:  Allergies  Allergen Reactions   Fish Allergy Shortness Of Breath   Shellfish Allergy Shortness Of Breath    Metabolic Disorder Labs: Lab Results  Component Value Date   HGBA1C 5.5 06/08/2020   Lab Results  Component Value Date   PROLACTIN 8.8 03/27/2017   Lab Results  Component Value Date   CHOL 175 06/08/2020   TRIG 189 (H) 06/08/2020   HDL 47 06/08/2020   CHOLHDL 3.7 06/08/2020   LDLCALC 96 06/08/2020   Lab Results  Component Value Date   TSH 4.350  09/22/2019   TSH 2.170 03/27/2017    Therapeutic Level Labs: No results found for: LITHIUM No results found for: VALPROATE No components found for:  CBMZ  Current Medications: Current Outpatient Medications  Medication Sig Dispense Refill   benztropine (COGENTIN) 0.5 MG tablet Take 1 tablet (0.5 mg total) by mouth daily as needed for tremors. 30 tablet 1   ARIPiprazole (ABILIFY) 10 MG tablet TAKE 1 TABLET BY MOUTH EVERY DAY 90 tablet 0   Butalbital-APAP-Caffeine 50-325-40 MG capsule Take by mouth.     citalopram (CELEXA) 10 MG tablet TAKE 1 TABLET BY MOUTH EVERY DAY 90 tablet 0   clonazePAM (KLONOPIN) 0.5 MG tablet Take 1 tablet (0.5 mg total) by mouth as directed. Take 1 tablet 1-2 times a week  for severe anxiety attacks only 8 tablet 0   cyclobenzaprine (FLEXERIL) 10 MG tablet Take 1 tablet by mouth as needed.     hydrOXYzine (ATARAX/VISTARIL) 25 MG tablet TAKE 1-2 TABLETS (25-50 MG TOTAL) BY MOUTH DAILY AS NEEDED FOR ANXIETY. 180 tablet 0   lamoTRIgine (LAMICTAL) 200 MG tablet Take 1 tablet (200 mg total) by mouth daily. 90 tablet 0   lamoTRIgine (LAMICTAL) 25 MG tablet Take 1-2 tablets (25-50 mg total) by mouth as directed. Take 1 tablet daily along with 200 mg daily for 2 weeks and increase to 2 tablets daily along with 200 mg 60 tablet 1   levonorgestrel (MIRENA) 20 MCG/24HR IUD by Intrauterine route.     naproxen (NAPROSYN) 500 MG tablet 1 po bid prn     traZODone (DESYREL) 50 MG tablet      No current facility-administered medications for this visit.     Musculoskeletal: Strength & Muscle Tone:  UTA Gait & Station:  Seated Patient leans: N/A  Psychiatric Specialty Exam: Review of Systems  Neurological:        Facial muscle twiching  Psychiatric/Behavioral:  Positive for decreased concentration. The patient is nervous/anxious.   All other systems reviewed and are negative.  not currently breastfeeding.There is no height or weight on file to calculate BMI.  General  Appearance: Casual  Eye Contact:  Fair  Speech:  Clear and Coherent  Volume:  Normal  Mood:  Anxious coping well  Affect:  Congruent  Thought Process:  Goal Directed and Descriptions of Associations: Intact  Orientation:  Full (Time, Place, and Person)  Thought Content: Logical   Suicidal Thoughts:  No  Homicidal Thoughts:  No  Memory:  Immediate;   Fair Recent;   Fair Remote;   Fair  Judgement:  Fair  Insight:  Fair  Psychomotor Activity:  Normal  Concentration:  Concentration: Fair and Attention Span: Fair  Recall:  [employer] of Knowledge: Fair  Language: Fair  Akathisia:  No  Handed:  Left  AIMS (if indicated): done  Assets:  Communication Skills Desire for [place] Talents/Skills Transportation Vocational/Educational  ADL's:  Intact  Cognition: WNL  Sleep:  Fair   Screenings: AIMS    Flowsheet Row Video Visit from 10/02/2020 in [place] Total Score 2      [place] Office Visit from 09/07/2020 in [place] Video Visit from 08/31/2020 in [place] Video Visit from 06/29/2020 in [place] Visit from 06/08/2020 in [place] Office Visit from 05/04/2020 in [place]  Total GAD-7 Score _0 PHQ2-9    Flowsheet Row Counselor from 09/22/2020 in [place] Office Visit from 09/07/2020 in [place] Video Visit from 08/31/2020 in [place] Video Visit from 07/14/2020 in [place] Video Visit from 06/29/2020 in [place]  PHQ-2 Total Score _1 PHQ-9 Total Score -- _2 Flowsheet Row Counselor from 09/22/2020 in [place] from 08/11/2020 in [place]  Video Visit from 06/29/2020 in [place] No Risk No Risk No Risk        Assessment and Plan: Zeah Germano is a 30 year old Caucasian female, lives in [place], married, has a  history of depression, anxiety, PCOS, chronic pain was evaluated by telemedicine today.  Patient with multiple psychosocial stressors including health problems of her daughter, currently making progress however does report possible adverse side effects to her medications , nightmares as well as muscle twitches.  Discussed plan as noted below.  Plan MDD in remission Abilify 10 mg p.o. daily Celexa 10 mg p.o. daily Lamictal 250 mg p.o. daily  GAD-improving Celexa 10 mg p.o. daily Hydroxyzine 25-50 mg p.o. daily as needed for severe anxiety Klonopin 0.5 mg as needed for severe panic attacks only Continue CBT with Ms. Seymour Hussami  Insomnia-stable Patient with nightmares unknown if this is due to trazodone. She will keep a dream journal, will monitor closely. Trazodone 50 mg p.o. 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 daily as needed.  Patient advised to monitor closely.  If it worsens or does not get better we will consider reducing the dosage of Abilify. Aims scale-2  History of ADHD-attention and concentration problem-patient has been referred for ADHD testing-pending.  Has upcoming appointment in September.  We will continue to coordinate care with Ms. Christina Hussami  Follow-up in clinic in 4 to 5 weeks or sooner if needed.  This note was generated in part or whole with voice recognition software. Voice recognition is usually quite accurate but there are transcription errors that can and very often do occur. I apologize for any typographical errors that were not detected and corrected.       Ursula Alert, MD 10/03/2020, 4:28 PM

## 2020-10-02 NOTE — Patient Instructions (Signed)
Benztropine tablets What is this medication? BENZTROPINE (BENZ troe peen) is for certain movement problems due toParkinson's disease, certain medicines, or other causes. This medicine may be used for other purposes; ask your health care provider orpharmacist if you have questions. COMMON BRAND NAME(S): Cogentin What should I tell my care team before I take this medication? They need to know if you have any of these conditions: glaucoma heart disease or a rapid heartbeat mental problems prostate trouble tardive dyskinesia an unusual or allergic reaction to benztropine, other medicines, lactose, foods, dyes, or preservatives pregnant or trying to get pregnant breast-feeding How should I use this medication? Take this medicine by mouth with a full glass of water. Follow the directions on the prescription label. Take your medicine at regular intervals. Do not takeyour medicine more often than directed. Talk to your pediatrician regarding the use of this medicine in children. While this drug may be prescribed for children as young as 3 years for selectedconditions, precautions do apply. Overdosage: If you think you have taken too much of this medicine contact apoison control center or emergency room at once. NOTE: This medicine is only for you. Do not share this medicine with others. What if I miss a dose? If you miss a dose, take it as soon as you can. If it is almost time for yournext dose, take only that dose. Do not take double or extra doses. What may interact with this medication? haloperidol medicines for movement abnormalities like Parkinson's disease phenothiazines like chlorpromazine, mesoridazine, prochlorperazine, thioridazine some antidepressants like amitriptyline, desipramine, doxepin, nortriptyline stimulant medicines for attention, weight loss, and to stay awake tegaserod This list may not describe all possible interactions. Give your health care provider a list of all the  medicines, herbs, non-prescription drugs, or dietary supplements you use. Also tell them if you smoke, drink alcohol, or use illegaldrugs. Some items may interact with your medicine. What should I watch for while using this medication? Visit your doctor or health care professional for regular checks on yourprogress. You may get drowsy or dizzy. Do not drive, use machinery, or do anything that needs mental alertness until you know how this medicine affects you. Do not stand or sit up quickly, especially if you are an older patient. This reduces the risk of dizzy or fainting spells. Alcohol may interfere with the effect ofthis medicine. Avoid alcoholic drinks. Your mouth may get dry. Chewing sugarless gum or sucking hard candy, and drinking plenty of water may help. Contact your doctor if the problem does notgo away or is severe. This medicine may cause dry eyes and blurred vision. If you wear contact lenses you may feel some discomfort. Lubricating drops may help. See your eye doctorif the problem does not go away or is severe. You may sweat less than usual while you are taking this medicine. As a result your body temperature could rise to a dangerous level. Be careful not to get overheated during exercise or in hot weather. You could get heat stroke. Avoidtaking hot baths and using hot tubs and saunas. What side effects may I notice from receiving this medication? Side effects that you should report to your doctor or health care professionalas soon as possible: allergic reactions like skin rash, itching or hives, swelling of the face, lips, or tongue changes in vision confusion decreased sweating or heat intolerance depression fast, irregular heartbeat hallucinations memory loss muscle weakness pain or difficulty passing urine vomiting Side effects that usually do not require medical attention (report to  yourdoctor or health care professional if they continue or are  bothersome): constipation dry mouth nausea This list may not describe all possible side effects. Call your doctor for medical advice about side effects. You may report side effects to FDA at1-800-FDA-1088. Where should I keep my medication? Keep out of the reach of children. Store below 30 degrees C (86 degrees F). Keep container tightly closed. Throwaway any unused medicine after the expiration date. NOTE: This sheet is a summary. It may not cover all possible information. If you have questions about this medicine, talk to your doctor, pharmacist, orhealth care provider.  2022 Elsevier/Gold Standard (2007-04-29 15:38:20)

## 2020-10-16 ENCOUNTER — Other Ambulatory Visit: Payer: Self-pay | Admitting: Psychiatry

## 2020-10-16 DIAGNOSIS — F411 Generalized anxiety disorder: Secondary | ICD-10-CM

## 2020-10-17 ENCOUNTER — Encounter: Payer: 59 | Attending: Psychology | Admitting: Psychology

## 2020-10-17 ENCOUNTER — Other Ambulatory Visit: Payer: Self-pay | Admitting: Psychiatry

## 2020-10-17 ENCOUNTER — Other Ambulatory Visit: Payer: Self-pay

## 2020-10-17 ENCOUNTER — Encounter: Payer: Self-pay | Admitting: Psychology

## 2020-10-17 DIAGNOSIS — F908 Attention-deficit hyperactivity disorder, other type: Secondary | ICD-10-CM | POA: Insufficient documentation

## 2020-10-17 DIAGNOSIS — F411 Generalized anxiety disorder: Secondary | ICD-10-CM | POA: Diagnosis not present

## 2020-10-17 DIAGNOSIS — F5105 Insomnia due to other mental disorder: Secondary | ICD-10-CM | POA: Insufficient documentation

## 2020-10-17 DIAGNOSIS — F4312 Post-traumatic stress disorder, chronic: Secondary | ICD-10-CM | POA: Insufficient documentation

## 2020-10-17 DIAGNOSIS — R41840 Attention and concentration deficit: Secondary | ICD-10-CM | POA: Diagnosis not present

## 2020-10-17 DIAGNOSIS — F331 Major depressive disorder, recurrent, moderate: Secondary | ICD-10-CM

## 2020-10-17 DIAGNOSIS — F3341 Major depressive disorder, recurrent, in partial remission: Secondary | ICD-10-CM | POA: Diagnosis not present

## 2020-10-17 NOTE — Progress Notes (Signed)
Neuropsychological Consultation   Patient:   Denise Walker   DOB:   07-19-1990  MR Number:  [id number]  Location:  [facility] PHYSICAL MEDICINE AND REHABILITATION [place], [place] [id number] [address] [place] [address] Dept: [phone]           Date of Service:   10/17/2020  Start Time:   10 AM End Time:   12 PM  Today's visit was an in person visit was conducted in my outpatient clinic office.  The patient myself were present.  1 hour and 15 minutes was spent in formal face-to-face clinical interview and the other 45 minutes was spent with records review, report writing and setting up testing protocols.  Provider/Observer:  Ilean Skill, Psy.D.       Clinical Neuropsychologist       Billing Code/Service: 96116/96121  Chief Complaint:    Denise Walker is a 30 year old female referred by her treating psychiatrist Ursula Alert, MD for neuropsychological evaluation due to concerns about possible underlying adult residual attention deficit disorder in a setting including past psychiatric history of major depressive disorder and generalized anxiety disorder.  Patient was also diagnosed with attention deficit disorder in elementary school and continue to be treated through high school with psychostimulant medications.  These were stopped after high school.  The patient has a past medical history that includes a history of eclampsia, migraines, and a grand mall seizure event around the birth of her first child.  Reason for Service:  Denise Walker is a 30 year old female referred by her treating psychiatrist Ursula Alert, MD for neuropsychological evaluation due to concerns about possible underlying adult residual attention deficit disorder in a setting including past psychiatric history of major depressive disorder and generalized anxiety disorder.  Patient was also diagnosed with attention deficit disorder in elementary  school and continue to be treated through high school with psychostimulant medications.  These were stopped after high school.  The patient has a past medical history that includes a history of eclampsia, migraines, and a grand mall seizure event around the birth of her first child.  The patient has been dealing with depression, anxiety, focus issues and irritability.  The patient reports that she is always had issues with attention and impulsive behaviors.  The patient reports that she was formally diagnosed with attention deficit disorder when she was in the third grade after failing the third grade and needing to repeat it.  Initially, the patient was trialed on Ritalin but reports that she has been told and recalls that that it made her very mad and angry in her mood state.  The patient reports that she had a better response to Concerta in high school and the fact that Concerta is the same active ingredient (methylphenidate) it may have been due to time-released measures or maturation that led to her better response.  The patient reports that she first developed significant issues with depression and anxiety around the time of her first child being born.  The patient developed postpartum depression, anxiety and PTSD symptoms associated with the birth of her first child.  She described it as a very traumatic birth.  The patient had a sudden onset eclampsia and had a seizure during her contraction phase.  The patient reports that she does not consciously remember the events around the birth.  The baby's heart rate was reported to have dropped very low and they had to perform a cesarean.  The patient had  a grand mall seizure around this time and her first awareness after coming to post seizure was that her child was not present and she had an immediate emotional response fearing that the baby had died and then became fearful of everything.  The patient reports that she was not able to hold her child or be alone  in the room with the child for fear that she would have another seizure.  The patient developed an ongoing fear that something was going to happen to the child.  This has become more generalized for fear of issues with all of her family.  The patient's second child has had some significant medical issues which have exacerbated some of this fear.  The patient reports that she has a constant fear that she is not doing enough to protect her family.  She reports that she is not sleeping well possibly due to her anxiety.  She has had at least 1 vivid dream where she woke up fearing that her husband was not breathing and he had his CPAP mask on.  In her partially asleep states she tried to take the mask off pulling it which then snapped back on her husband's face startling him.  The patient reports that she has not had any other types of disturbances when waking up.  She does acknowledge ongoing issues with anxiety and worry.  The patient denies any other neurological events and has had no seizure before or after this grand mall event.  The patient reports that she does have ongoing difficulties with insomnia and difficulty going to sleep most nights.  She reports she is still having vivid dreams but not as much as before and is unclear whether medication such as trazodone are playing a role for her general level of insomnia and lack of sleep.  The patient describes her appetite is normal but reports that she can be rather forgetful at times.  She reports that all of these issues have caused some tension at home.  The patient has been having progressive improvements in her anxiety and depressive state with her current medication regimen and the care she is getting from her treating psychiatrist.  The patient is also actively participating in psychotherapeutic interventions with Christian Hussami, LCSW.  To better manage and cope with her anxiety and to develop behavioral strategies around her depressive symptoms and  anxiety.  Behavioral Observation: Tierany Appleby  presents as a 30 y.o.-year-old Left Caucasian Female who appeared her stated age. her dress was Appropriate and she was Well Groomed and her manners were Appropriate to the situation.  her participation was indicative of Appropriate behaviors.  There were not physical disabilities noted.  she displayed an appropriate level of cooperation and motivation.     Interactions:    Active Appropriate  Attention:   within normal limits and attention span and concentration were age appropriate  Memory:   within normal limits; recent and remote memory intact  Visuo-spatial:  not examined  Speech (Volume):  normal  Speech:   normal; normal  Thought Process:  Coherent and Relevant  Though Content:  WNL; not suicidal and not homicidal  Orientation:   person, place, time/date, and situation  Judgment:   Good  Planning:   Fair  Affect:    Anxious  Mood:    Anxious  Insight:   Good  Intelligence:   normal  Marital Status/Living: The patient was born and [place] along with 2 siblings.  She reports that her birth was  somewhat complicated and premature.  She reports that she weighed 2 pounds 14 ounces at birth.  Developmental milestones were not noted to be particularly abnormal.  The patient currently lives with her husband of 9 years along with their 3 children.  She has a 71-year-old who has been diagnosed with ADHD, 72-year-old and a 24-year-old.  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  Substance Use:  No concerns of substance abuse are reported.  The patient acknowledges only occasional use of alcohol and no alcohol abuse.  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   Attention and concentration deficit 08/31/2020   Moderate episode of recurrent major depressive disorder ([place]) 06/29/2020   High risk medication use 06/29/2020   History of ADHD 06/29/2020   Uterine scar from previous cesarean delivery affecting pregnancy 12/31/2018   Anti-D antibodies present during pregnancy 07/31/18   BRCA gene mutation negative 05/04/2018   History of cesarean delivery 02/10/2018   Rh negative state in antepartum period 08/23/2017   History of eclampsia 07/22/2017   Migraines 04/09/2017   GAD (generalized anxiety disorder) 12/01/2014              Abuse/Trauma History: The patient had extremely traumatic experience around the birth of her first child where she developed sudden onset eclampsia and had a grand mall seizure without prior history of seizure.  Upon orienting after the birth of her child which she has no memory of, the patient had a sudden onset of fear that her child had died as the child was not with her in the room.  This has continued on with intrusive fears and anxiety types of responses.  Psychiatric History:  The patient developed postpartum like depression and significant anxiety along with PTSD with onset around the birth of her first child which was extremely traumatic and stressful.  Family Med/Psych History:  Family History  Problem Relation Age of Onset   Bipolar disorder Mother    Ovarian cancer Mother    Arthritis Mother    Ovarian cancer Maternal Aunt    Diabetes Paternal Aunt    Breast cancer Maternal Grandmother    Diabetes Paternal Grandfather    Uterine cancer  Cousin    ADD / ADHD Daughter    ODD Daughter     Risk of Suicide/Violence: virtually non-existent patient denies any suicidal or homicidal ideation.  Impression/DX:  Odessia Asleson is a 30 year old female referred by her treating psychiatrist Ursula Alert, MD for neuropsychological evaluation due to concerns about possible underlying adult residual attention deficit disorder in a setting including past psychiatric history of major depressive disorder and generalized anxiety disorder.  Patient was also diagnosed with attention deficit disorder in elementary school and continue to be treated through high school with psychostimulant medications.  These were stopped after high school.  The patient has a past medical history that includes a history of eclampsia, migraines, and a grand mall seizure event around the birth of her first child.  Disposition/Plan:  We have set the patient up for formal neuropsychological testing and she will be administered the comprehensive attention battery as well as the CAB CPT measure.  Once these are completed a determination will be made as to any potential need for other testing but at this point we have a very good clinical history and objective assessment of a multifactorial model of attention and concentration should be adequate to answer specific referral questions and provide structured recommendations.  Diagnosis:    MDD (major depressive disorder), recurrent, in partial remission ([place])  Insomnia due to mental disorder  Attention and concentration deficit  GAD (generalized anxiety disorder)         Electronically Signed   _______________________ Ilean Skill, Psy.D. Clinical Neuropsychologist

## 2020-10-20 ENCOUNTER — Other Ambulatory Visit: Payer: Self-pay | Admitting: Psychiatry

## 2020-10-20 DIAGNOSIS — F331 Major depressive disorder, recurrent, moderate: Secondary | ICD-10-CM

## 2020-10-20 DIAGNOSIS — F411 Generalized anxiety disorder: Secondary | ICD-10-CM

## 2020-10-23 ENCOUNTER — Ambulatory Visit (INDEPENDENT_AMBULATORY_CARE_PROVIDER_SITE_OTHER): Payer: 59 | Admitting: Licensed Clinical Social Worker

## 2020-10-23 ENCOUNTER — Other Ambulatory Visit: Payer: Self-pay

## 2020-10-23 DIAGNOSIS — F3342 Major depressive disorder, recurrent, in full remission: Secondary | ICD-10-CM

## 2020-10-23 DIAGNOSIS — F411 Generalized anxiety disorder: Secondary | ICD-10-CM

## 2020-10-23 NOTE — Plan of Care (Signed)
Developed plan 

## 2020-10-23 NOTE — Plan of Care (Signed)
  Problem: Depression CCP Problem  1 Decrease depressive symptoms and improve levels of effective functioning Goal: LTG: Reduce frequency, intensity, and duration of depression symptoms as evidenced by: SSB input needed on appropriate metric Outcome: Progressing Goal: STG: @PREFFIRSTNAME @ will participate in at least 80% of scheduled individual psychotherapy sessions Outcome: Progressing Intervention: Work with @PREFFIRSTNAME @ to identify the major components of a recent episode of depression: physical symptoms, major thoughts and images, and major behaviors they experienced

## 2020-10-23 NOTE — Progress Notes (Signed)
Virtual Visit via Video Note  I connected with Denise Walker on 10/23/20 at  1:00 PM EDT by a video enabled telemedicine application and verified that I am speaking with the correct person using two identifiers.  Location: Patient: home Provider: remote office [place], [place])   I discussed the limitations of evaluation and management by telemedicine and the availability of in person appointments. The patient expressed understanding and agreed to proceed.   I discussed the assessment and treatment plan with the patient. The patient was provided an opportunity to ask questions and all were answered. The patient agreed with the plan and demonstrated an understanding of the instructions.   The patient was advised to call back or seek an in-person evaluation if the symptoms worsen or if the condition fails to improve as anticipated.  I provided 30 minutes of non-face-to-face time during this encounter.   Detrick Dani R Kashina Mecum, LCSW   THERAPIST PROGRESS NOTE  Session Time: 1-1:30p  Participation Level: Active  Behavioral Response: Neat and Well GroomedAlertEuthymic  Type of Therapy: Individual Therapy  Treatment Goals addressed: Diagnosis: depression  Interventions: CBT  Summary: Denise Walker is a 30 y.o. female who presents with improving symptoms related to depression diagnosis. Pt reports that overall mood is stable and that she is managing stress and anxiety symptoms well. Patient reports good quality and quantity of sleep. Allowed patient safe space to explore and express thoughts and feelings associated with recent external stressors and life events.   Patient reports that she is compliant with medication, and feels that it is working well to manage her symptoms. Patient reports that side effects that she is experiencing is twitching on the left side of her body, and vivid dreams.   Patient reports that overall relationships with family members are going well. Patient reports  that she is trying to manage overall self-care and life balance on a daily basis. Patient reports that her children health is going well, which is a major trigger for patient. Patient reports that she is feeling good overall with regulating her emotions in managing depression and anxiety symptoms.  Continued recommendations are as follows: self care behaviors, positive social engagements, focusing on overall work/home/life balance, and focusing on positive physical and emotional wellness.  .   Suicidal/Homicidal: No  Therapist Response: Revised treatment plan--pt is able to identify anxiety and depression triggers and is able to identify and utlize coping skills strategies to manage stress and anxiety symptoms. These behaviors are reflective of both personal growth and progress. Treatment to continue as indicated.  Plan: Return again in 4 weeks.  Diagnosis: Axis I: MDD, recurrent, remission; GAD    Axis II: No diagnosis    Ernest Haber Alfred Eckley, LCSW 10/23/2020

## 2020-10-26 ENCOUNTER — Other Ambulatory Visit: Payer: Self-pay

## 2020-10-26 ENCOUNTER — Other Ambulatory Visit: Payer: Self-pay | Admitting: Psychiatry

## 2020-10-26 DIAGNOSIS — F3341 Major depressive disorder, recurrent, in partial remission: Secondary | ICD-10-CM | POA: Diagnosis not present

## 2020-10-26 DIAGNOSIS — T50905A Adverse effect of unspecified drugs, medicaments and biological substances, initial encounter: Secondary | ICD-10-CM

## 2020-10-26 DIAGNOSIS — G2589 Other specified extrapyramidal and movement disorders: Secondary | ICD-10-CM

## 2020-10-26 NOTE — Progress Notes (Signed)
   Behavioral Observations  The patient appeared well-groomed and appropriately dressed for the testing session. Her manners were polite and appropriate and she complied with testing instructions.   Neuropsychology Note  Denise Walker completed 60 minutes of neuropsychological testing with technician, Marica Otter, BA, under the supervision of Arley Phenix, PsyD., Clinical Neuropsychologist. The patient did not appear overtly distressed by the testing session, per behavioral observation or via self-report to the technician. Rest breaks were offered.   Clinical Decision Making: In considering the patient's current level of functioning, level of presumed impairment, nature of symptoms, emotional and behavioral responses during clinical interview, level of literacy, and observed level of motivation/effort, a battery of tests was selected by Dr. Kieth Brightly during initial consultation on 10/17/2020. This was communicated to the technician. Communication between the neuropsychologist and technician was ongoing throughout the testing session and changes were made as deemed necessary based on patient performance on testing, technician observations and additional pertinent factors such as those listed above.  Tests Administered: Comprehensive Attention Battery (CAB) Continuous Performance Test (CPT)   Results: Will be included in final report   Feedback to Patient: Denise Walker will return on 02/01/2021 for an interactive feedback session with Dr. Kieth Brightly at which time her test performances, clinical impressions and treatment recommendations will be reviewed in detail. The patient understands she can contact our office should she require our assistance before this time.  60 minutes spent face-to-face with patient administering standardized tests, 30 minutes spent scoring Radiographer, therapeutic). [CPT [id number], [id number]]  Full report to follow.

## 2020-10-30 DIAGNOSIS — B354 Tinea corporis: Secondary | ICD-10-CM | POA: Diagnosis not present

## 2020-10-30 DIAGNOSIS — G43719 Chronic migraine without aura, intractable, without status migrainosus: Secondary | ICD-10-CM | POA: Diagnosis not present

## 2020-10-31 ENCOUNTER — Other Ambulatory Visit: Payer: Self-pay | Admitting: Psychiatry

## 2020-10-31 DIAGNOSIS — F331 Major depressive disorder, recurrent, moderate: Secondary | ICD-10-CM

## 2020-11-08 ENCOUNTER — Encounter (HOSPITAL_BASED_OUTPATIENT_CLINIC_OR_DEPARTMENT_OTHER): Payer: 59 | Admitting: Psychology

## 2020-11-08 ENCOUNTER — Other Ambulatory Visit: Payer: Self-pay

## 2020-11-08 ENCOUNTER — Encounter: Payer: Self-pay | Admitting: Psychology

## 2020-11-08 DIAGNOSIS — F411 Generalized anxiety disorder: Secondary | ICD-10-CM

## 2020-11-08 DIAGNOSIS — F908 Attention-deficit hyperactivity disorder, other type: Secondary | ICD-10-CM | POA: Diagnosis not present

## 2020-11-08 DIAGNOSIS — F4312 Post-traumatic stress disorder, chronic: Secondary | ICD-10-CM | POA: Diagnosis not present

## 2020-11-08 DIAGNOSIS — F3341 Major depressive disorder, recurrent, in partial remission: Secondary | ICD-10-CM | POA: Diagnosis not present

## 2020-11-08 DIAGNOSIS — F5105 Insomnia due to other mental disorder: Secondary | ICD-10-CM

## 2020-11-08 NOTE — Progress Notes (Signed)
Neuropsychological Evaluation   Patient:  Denise Walker   DOB: [birth year]  MR Number: [id number]  Location: [facility] [place] [facility] [address] [place], [address] [id number] [facility] [place] [address] Dept: [phone]  Start: 8 AM End: 9 AM  Provider/Observer:     Hershal Coria PsyD  Chief Complaint:      Chief Complaint  Patient presents with   Anxiety   Depression    Reason For Service:      Denise Walker is a 30 year old female referred by her treating psychiatrist Jomarie Longs, MD for neuropsychological evaluation due to concerns about possible underlying adult residual attention deficit disorder in a setting including past psychiatric history of major depressive disorder and generalized anxiety disorder.  Patient was also diagnosed with attention deficit disorder in elementary school and continued to be treated through high school with psychostimulant medications.  These were stopped after high school.  The patient has a past medical history that includes a history of eclampsia, migraines, and a grand mall seizure event around the birth of her first child.  The patient has been dealing with depression, anxiety, focus issues and irritability.  The patient reports that she has always had issues with attention and impulsive behaviors.  The patient reports that she was formally diagnosed with attention deficit disorder when she was in the third grade after failing the third grade and needing to repeat it.  Initially, the patient was trialed on Ritalin but reports that she has been told and recalls that that it made her very mad and angry in her mood state.  The patient reports that she had a better response to Concerta in high school and the fact that Concerta is the same active ingredient (methylphenidate) it may have been due to time-released measures or maturation that led to her better  response.  The patient reports that she first developed significant issues with depression and anxiety around the time of her first child being born.  The patient developed postpartum depression, anxiety and PTSD symptoms associated with the birth of her first child.  She described it as a very traumatic birth.  The patient had a sudden onset eclampsia and had a seizure during her contraction phase.  The patient reports that she does not consciously remember the events around the birth.  The baby's heart rate was reported to have dropped very low and they had to perform a cesarean.  The patient had a grand mall seizure around this time and her first awareness after coming to post seizure was that her child was not present and she had an immediate emotional response fearing that the baby had died and then became fearful of everything.  The patient reports that she was not able to hold her child or be alone in the room with the child for fear that she would have another seizure.  The patient developed an ongoing fear that something was going to happen to the child.  This has become more generalized for fear of issues with all of her family.  The patient's second child has had some significant medical issues which have exacerbated some of this fear.  The patient reports that she has a constant fear that she is not doing enough to protect her family.  She reports that she is not sleeping well possibly due to her anxiety.  She has had at least 1 vivid dream where she woke up fearing that her husband  was not breathing and he had his CPAP mask on.  In her partially asleep states she tried to take the mask off pulling it which then snapped back on her husband's face startling him.  The patient reports that she has not had any other types of disturbances when waking up.  She does acknowledge ongoing issues with anxiety and worry.  The patient denies any other neurological events and has had no seizure before or after  this grand mall event.  The patient reports that she does have ongoing difficulties with insomnia and difficulty going to sleep most nights.  She reports she is still having vivid dreams but not as much as before and is unclear whether medication such as trazodone are playing a role for her general level of insomnia and lack of sleep.  The patient describes her appetite as normal but reports that she can be rather forgetful at times.  She reports that all of these issues have caused some tension at home.  The patient has been having progressive improvements in her anxiety and depressive state with her current medication regimen and the care she is getting from her treating psychiatrist.  The patient is also actively participating in psychotherapeutic interventions with Christian Hussami, LCSW To better manage and cope with her anxiety and to develop behavioral strategies around her depressive symptoms and anxiety.  Tests Administered: Comprehensive Attention Battery (CAB) Continuous Performance Test (CPT)  Participation Level:   Active  Participation Quality:  Appropriate      Behavioral Observation:  The patient appeared well-groomed and appropriately dressed for the testing session. Her manners were polite and appropriate and she complied with testing instructions.   Well Groomed, Alert, and Appropriate.   Test Results:   The patient was administered the comprehensive attention battery in its entirety and initially she was administered the auditory/visual pure reaction time subtest.  On the visual pure reaction time measure the patient correctly identified 49 of 50 targets with 1 error of omission.  This accuracy score is well within normative expectations.  Her average response time was 281 ms which is also well within normative expectations and effect on the better and relative to these norms.  On the pure auditory reaction time measure the patient correctly identified 49-50 targets with 1 error  of omission.  The patient averaged 311 ms per response time.  Both accuracy and response time measures were equal to or better than normative expectations.  This pattern indicates that global alertness and awakeness is appropriate and that the patient was given full effort throughout these measures.  The patient was then administered the discriminate reaction time measure subtest.  On the visual discriminate reaction time subtest the patient correctly responded to 32 of 35 targets with 9 errors of commission and 3 errors of omission.  Average response time for correct responses was 306 ms.  While her average response time was within normative expectations she did show a significant increase of errors of commission suggesting some difficulty inhibiting impulsive responses throughout this measure.  On the auditory discriminate reaction time measure she correctly identified 35 of 35 targets with 4 errors of commission.  Her average response time was 495 ms.  Response times was within normal limits but the patient had a significant elevation in errors of commission relative to normative expectations again highlighting difficulty inhibiting impulsive responses.  On the shift discriminate reaction time measure she correctly identified 25 of 30 targets with 7 errors of commission and 5 errors of  omission.  Average response time was within normative expectations but as with the other discriminate reaction time measure she had an excessive elevation in errors of commission reinforcing patterns of difficulties inhibiting impulsive response to this.  The patient was then administered the auditory/visual scan reaction time test.  On the visual scan reaction time measure she correctly identified 40 of 40 targets with 0 errors of omission and 0 errors of commission.  Average response time was 508 ms.  Both accuracy and response times were within normative expectations.  On the auditory scan reaction time measure she correctly  identified 30 of 40 targets with 3 errors of commission and 7 errors of omission.  This is an impaired score with elevations in both omissions and commissions.  Average response time was 1583 which is also significantly elevated.  This pattern suggest the possibility of some element of central auditory processing or at least auditory processing weaknesses.  On the mixed scan reaction time measure the patient correctly identified 35 of 35 targets with 0 errors of omission but 5 errors of commission.  These errors of commission were on auditory components of this measure.  This is a significant elevation for errors of commission as well and has been a pattern that we have seen on multiple subtest related to difficulty inhibiting impulsive responses  The patient was then administered the auditory/visual encoding measure.  While on the digit span forwards the patient had mild difficulty performing 1 standard deviation below normative expectations she did much better on the digit span backwards suggesting that she adjusted well to this task and it would indicate that auditory encoding is performing well as she did much better on the more difficult task.  On both the visual encoding forwards and visual encoding backwards she performed at normative expectations on the forwards portion on the backwards portion she performed 1 standard deviation better than normative expectation.  This pattern suggest that she is doing fine as far as both auditory and visual encoding capacity.  The patient then was administered the Stroop interference cancellation measure.  This measure starts off with 4 series of 9 interference trials that are a focus execute task.  After the patient completes the 4  15-second task requiring her to correctly identify elements where the word and color of the word match it is then shifted to a targeted interference trial in which auditory targeted interference is applied and the patient is instructed to  adapt and try to remain free from this targeted distractor.  Initially, the patient had difficulty with this focus execute task even in the 9 interference trial.  On the first 2 series she correctly identified 7 of the 18 targets which is 1 standard deviation below normative expectations.  On the next 2 noninterference trials she did improve somewhat but was still at the lower end of normative expectations correct identified 11 and 10 targets respectively.  When this measure was shifted to targeted interference her performance did decrease and she only identified 8 of the 18 targets.  The patient continued to show significant difficulty with this interference and by the fourth interference trial the patient was still only identifying 7 of the 18 targets indicating that she had great difficulty remaining free from distractibility and did not adjust to this targeted distraction.  Finally, the patient was administered the CAB CPT measure which is a 15-minute discriminate reaction time measure that is visual in nature and is divided into five 3-minute blocks of time for analysis.  On the first 3-minute block of time the patient correctly identified 30 of 30 targets with 1 error of commission.  Average response time was 352 ms.  Both of these are within normative expectations.  However, over the next four 3-minute blocks of time the patient progressively deteriorated with regard to both errors of omission as well as errors of commission.  While her average response time only increased roughly 150 ms which is at the outside limits of normative expectations she became increasingly distracted with increasing errors of omission and increasing errors of commission.  This pattern also suggest difficulty with sustaining attention and concentration.  Summary of Results:   Overall, the results of the current objective assessment of a multifactorial model of attention and concentration do indicate very specific underlying  cognitive deficits and executive functioning and attention and concentration.  The patient showed consistent patterns across multiple subtest at this extensive battery of difficulties inhibiting impulsive responding in both auditory and visual sensory modalities.  The patient also showed some indications of errors of omission and lapses of attention.  The patient clearly had difficulty inhibiting impulsive responding.  The patient also showed deficits with regard to her ability to remain free from distractibility and particular vulnerability to external distractors.  Another element of attention that showed significant impairments wears her impairment surrounding sustained attention and concentration where she had difficulty with maintaining information processing speed as well as progressive deterioration in lapses of attention and inattention as well as increases in difficulties inhibiting impulsive responses.  The patient showed a progressive decrease in information processing speed on the sustained measures.  Overall, this pattern would indicate that attentional deficits and weaknesses were found for focus execute abilities including impulsive responses and inattention, deficits with regard to sustained attention and deficits with regard to divided attention and freedom from distractibility as well as deficits with regard to sustaining attention as a function of time.  The patient showed good performance with regard to both auditory and visual encoding aspect of attention and concentration.  Impression/Diagnosis:   The current neuropsychological evaluation does show consistencies between the patient's subjective reports of attention and concentration deficits and obtained objective assessment of attention and concentration.  The patient describes a lifelong difficulty with attention and concentration and treatment starting in the third grade for these elements with psychostimulant medications.  The patient  denies the existence of depression until the time of the birth of her first child which was very intense and traumatic with the development of posttraumatic stress disorder that is now chronic associated with her experiences around her child's birth.  This PTSD features have continued as well and have contributed to depression and anxiety based symptoms directly from the PTSD.  The patient continues to have difficulties with onset and maintenance of sleep but no other significant medical issues of note other than a history of migraines at times in the 1 individual seizure event around her eclampsia and birth of her first child and she has not had any other seizure event after this time.  As far as diagnostic considerations, the patient does meet the criterion for adult residual attention disorder but exacerbating these difficulties are her clear and consistent symptoms related to chronic posttraumatic stress disorder.  The patient also is continuing to have significant sleep disturbance that is interacting with her PTSD.  The patient displays neuropsychological strengths and weaknesses that would suggest she would be a good responder to psychostimulant medications and it does not appear that her singular  seizure event puts her at greater risk for use with these medications although this risk factor should be taking into account.  The patient shows deficits within inhibiting impulsive responses, increased distractibility and difficulty remaining free from external distractors, and impairments with regard to sustaining attention relative to normative expectations.  Along with consideration of reintroduction of psychostimulant medications the patient should clearly continue with her psychotherapeutic interventions around her chronic posttraumatic stress disorder which continues to be quite problematic for her and continue to work on improving sleep hygiene and sleep patterns.  Working on these 2 latter issues will be  very important for her overall quality of life and addressing some of the deleterious effects the PTSD and sleep disturbance are having on her attentional variables.  I will sit down with the patient and go over the results of the current neuropsychological evaluation and stressed the importance of her continuation of work around her PTSD and give recommendations around improve sleep hygiene variables.  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 Clinical Neuropsychologist

## 2020-11-15 ENCOUNTER — Other Ambulatory Visit
Admission: RE | Admit: 2020-11-15 | Discharge: 2020-11-15 | Disposition: A | Discharge status: Home / Self Care | Payer: 59 | Attending: Psychiatry | Admitting: Psychiatry

## 2020-11-15 ENCOUNTER — Telehealth (INDEPENDENT_AMBULATORY_CARE_PROVIDER_SITE_OTHER): Payer: 59 | Admitting: Psychiatry

## 2020-11-15 ENCOUNTER — Other Ambulatory Visit: Payer: Self-pay

## 2020-11-15 ENCOUNTER — Encounter: Payer: Self-pay | Admitting: Psychiatry

## 2020-11-15 DIAGNOSIS — F3342 Major depressive disorder, recurrent, in full remission: Secondary | ICD-10-CM

## 2020-11-15 DIAGNOSIS — F411 Generalized anxiety disorder: Secondary | ICD-10-CM

## 2020-11-15 DIAGNOSIS — G2589 Other specified extrapyramidal and movement disorders: Secondary | ICD-10-CM | POA: Insufficient documentation

## 2020-11-15 DIAGNOSIS — Z79899 Other long term (current) drug therapy: Secondary | ICD-10-CM | POA: Insufficient documentation

## 2020-11-15 DIAGNOSIS — T50905A Adverse effect of unspecified drugs, medicaments and biological substances, initial encounter: Secondary | ICD-10-CM | POA: Diagnosis not present

## 2020-11-15 DIAGNOSIS — F900 Attention-deficit hyperactivity disorder, predominantly inattentive type: Secondary | ICD-10-CM | POA: Insufficient documentation

## 2020-11-15 DIAGNOSIS — F5105 Insomnia due to other mental disorder: Secondary | ICD-10-CM | POA: Insufficient documentation

## 2020-11-15 MED ORDER — LAMOTRIGINE 200 MG PO TABS: 200.0000 mg | ORAL_TABLET | Freq: Every day | ORAL | 0 refills | Status: DC

## 2020-11-15 MED ORDER — AMPHETAMINE-DEXTROAMPHET ER 5 MG PO CP24: 5.0000 mg | ORAL_CAPSULE | Freq: Every day | ORAL | 0 refills | Status: DC

## 2020-11-15 MED ORDER — ARIPIPRAZOLE 5 MG PO TABS: 5.0000 mg | ORAL_TABLET | Freq: Every day | ORAL | 1 refills | Status: DC

## 2020-11-15 NOTE — Progress Notes (Signed)
Virtual Visit via Video Note  I connected with Denise Walker on 11/15/20 at  2:00 PM EDT by a video enabled telemedicine application and verified that I am speaking with the correct person using two identifiers.  Location Provider Location : [facility] Patient Location : Home  Participants: Patient , Provider    I discussed the limitations of evaluation and management by telemedicine and the availability of in person appointments. The patient expressed understanding and agreed to proceed.   I discussed the assessment and treatment plan with the patient. The patient was provided an opportunity to ask questions and all were answered. The patient agreed with the plan and demonstrated an understanding of the instructions.   The patient was advised to call back or seek an in-person evaluation if the symptoms worsen or if the condition fails to improve as anticipated.   [place] MD OP Progress Note  11/15/2020 2:14 PM Denise Walker  MRN:  [id number]  Chief Complaint:  Chief Complaint   Follow-up; Anxiety; Depression    HPI: Denise Walker is a 30 year old Caucasian female, stay-at-home mom, lives in [place], has a history of MDD, GAD, history of ADHD was evaluated by telemedicine today.  Patient today reports she continues to struggle with attention, focus problem, keeping up with household chores, organization skills.  Patient had ADHD testing done recently.  Reviewed and discussed ADHD testing report.  She is interested in psychostimulants.  May have tried medications like Concerta which may have made her irritable, Ritalin in the past.  Patient reports she feels frustrated with regards to her mood at times.  Also struggles with low energy.  Patient reports sleep is overall okay except for having weird dreams some nights.  She does not have any more acting out episodes in her sleep.  Does have some twitching of her corner of her mouth, eyes as well as fingers which last for a few seconds to  minutes.  It does not distress her or bother her.  She has not yet taken the Cogentin.  Patient denies any suicidality, homicidality or perceptual disturbances.  Patient denies any other concerns today.  Visit Diagnosis:    ICD-10-CM   1. MDD (major depressive disorder), recurrent, in full remission ([place])  F33.42 ARIPiprazole (ABILIFY) 5 MG tablet    2. GAD (generalized anxiety disorder)  F41.1 ARIPiprazole (ABILIFY) 5 MG tablet    lamoTRIgine (LAMICTAL) 200 MG tablet    3. Insomnia due to mental disorder  F51.05    anxiety    4. ADHD (attention deficit hyperactivity disorder), inattentive type  F90.0 amphetamine-dextroamphetamine (ADDERALL XR) 5 MG 24 hr capsule    5. 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 High risk medication use  Z79.899 Urine drugs of abuse scrn w alc, routine (Ref Lab)      Past Psychiatric History: Reviewed past psychiatric history from progress note on 06/29/2020.  Past trials of medications like Lexapro, Zoloft, Celexa, Trintellix, Concerta, Ritalin, Wellbutrin.   Past Medical History:  Past Medical History:  Diagnosis Date   Anxiety    Back pain    low back pain with lumbar radiculitis   Bilateral shoulder pain    BRCA negative 04/2018   MyRisk neg; IBIS=8.9%/riskscore=6.7%   Eclampsia    Family history of ovarian cancer    Gestational diabetes    Migraine headache    PCOS (polycystic ovarian syndrome)     Past Surgical History:  Procedure Laterality Date  CESAREAN SECTION N/A 07/15/2014   Procedure: CESAREAN SECTION;  Surgeon: Gae Dry, MD;  Location: [facility] ORS;  Service: Obstetrics;  Laterality: N/A;   CESAREAN SECTION N/A 02/10/2018   Procedure: CESAREAN SECTION;  Surgeon: Will Bonnet, MD;  Location: [facility] ORS;  Service: Obstetrics;  Laterality: N/A;   CESAREAN SECTION N/A 12/31/2018   Procedure: CESAREAN SECTION;  Surgeon: [name], MD;  Location: [facility] ORS;  Service: Obstetrics;   Laterality: N/A;   CESAREAN SECTION     HERNIA REPAIR  10/2013   WISDOM TOOTH EXTRACTION      Family Psychiatric History: Reviewed family psychiatric history from progress note on 06/29/2020  Family History:  Family History  Problem Relation Age of Onset   Bipolar disorder Mother    Ovarian cancer Mother    Arthritis Mother    Ovarian cancer Maternal Aunt    Diabetes Paternal Aunt    Breast cancer Maternal Grandmother    Diabetes Paternal Grandfather    Uterine cancer Cousin    ADD / ADHD Daughter    ODD Daughter     Social History: Reviewed social history from progress note on 06/29/2020 Social History   Socioeconomic History   Marital status: Married    Spouse name: Denise Walker   Number of children: 3   Years of education: high school   Highest education level: Not on file  Occupational History   Not on file  Tobacco Use   Smoking status: Never   Smokeless tobacco: Never  Vaping Use   Vaping Use: Never used  Substance and Sexual Activity   Alcohol use: Yes    Comment: occasional wine cooler   Drug use: Never   Sexual activity: Yes    Birth control/protection: I.U.D.    Comment: will discuss with MD  Other Topics Concern   Not on file  Social History Narrative   Not on file   Social Determinants of Health   Financial Resource Strain: Not on file  Food Insecurity: Not on file  Transportation Needs: Not on file  Physical Activity: Not on file  Stress: Not on file  Social Connections: Not on file    Allergies:  Allergies  Allergen Reactions   Fish Allergy Shortness Of Breath   Shellfish Allergy Shortness Of Breath    Metabolic Disorder Labs: Lab Results  Component Value Date   HGBA1C 5.5 06/08/2020   Lab Results  Component Value Date   PROLACTIN 8.8 03/27/2017   Lab Results  Component Value Date   CHOL 175 06/08/2020   TRIG 189 (H) 06/08/2020   HDL 47 06/08/2020   CHOLHDL 3.7 06/08/2020   LDLCALC 96 06/08/2020   Lab Results  Component Value  Date   TSH 4.350 09/22/2019   TSH 2.170 03/27/2017    Therapeutic Level Labs: No results found for: LITHIUM No results found for: VALPROATE No components found for:  CBMZ  Current Medications: Current Outpatient Medications  Medication Sig Dispense Refill   amphetamine-dextroamphetamine (ADDERALL XR) 5 MG 24 hr capsule Take 1 capsule (5 mg total) by mouth daily. 10 capsule 0   ARIPiprazole (ABILIFY) 5 MG tablet Take 1 tablet (5 mg total) by mouth daily. Dose change 30 tablet 1   clotrimazole-betamethasone (LOTRISONE) cream Apply topically 2 (two) times daily.     fluticasone (FLONASE) 50 MCG/ACT nasal spray Place into the nose.     benztropine (COGENTIN) 0.5 MG tablet TAKE 1 TABLET (0.5 MG TOTAL) BY MOUTH DAILY AS NEEDED FOR TREMORS. 30 tablet  1   Butalbital-APAP-Caffeine 50-325-40 MG capsule Take by mouth.     citalopram (CELEXA) 10 MG tablet TAKE 1 TABLET BY MOUTH EVERY DAY 90 tablet 0   clonazePAM (KLONOPIN) 0.5 MG tablet Take 1 tablet (0.5 mg total) by mouth as directed. Take 1 tablet 1-2 times a week for severe anxiety attacks only 8 tablet 0   clotrimazole-betamethasone (LOTRISONE) cream Apply topically 2 (two) times daily.     cyclobenzaprine (FLEXERIL) 10 MG tablet Take 1 tablet by mouth as needed.     hydrOXYzine (ATARAX/VISTARIL) 25 MG tablet TAKE 1-2 TABLETS (25-50 MG TOTAL) BY MOUTH DAILY AS NEEDED FOR ANXIETY. 180 tablet 0   lamoTRIgine (LAMICTAL) 200 MG tablet Take 1 tablet (200 mg total) by mouth daily. 90 tablet 0   lamoTRIgine (LAMICTAL) 25 MG tablet TAKE 2 TABLETS (50 MG TOTAL) BY MOUTH DAILY. TAKE ALONG WITH 200 MG DAILY - TOTAL OF 250 MG DAILY 180 tablet 0   levonorgestrel (MIRENA) 20 MCG/24HR IUD by Intrauterine route.     naproxen (NAPROSYN) 500 MG tablet 1 po bid prn     traZODone (DESYREL) 50 MG tablet      No current facility-administered medications for this visit.     Musculoskeletal: Strength & Muscle Tone:  UTA Gait & Station:  Seated Patient leans:  N/A  Psychiatric Specialty Exam: Review of Systems  Psychiatric/Behavioral:  Positive for decreased concentration and sleep disturbance. The patient is nervous/anxious.   All other systems reviewed and are negative.  not currently breastfeeding.There is no height or weight on file to calculate BMI.  General Appearance: Casual  Eye Contact:  Fair  Speech:  Clear and Coherent  Volume:  Normal  Mood:  Anxious, frustrated  Affect:  Congruent  Thought Process:  Goal Directed and Descriptions of Associations: Intact  Orientation:  Full (Time, Place, and Person)  Thought Content: Logical   Suicidal Thoughts:  No  Homicidal Thoughts:  No  Memory:  Immediate;   Fair Recent;   Fair Remote;   Fair  Judgement:  Fair  Insight:  Fair  Psychomotor Activity:  Normal  Concentration:  Concentration: Fair and Attention Span: Fair  Recall:  [employer] of Knowledge: Fair  Language: Fair  Akathisia:  No  Handed:  Left  AIMS (if indicated): done  Assets:  Communication Skills Desire for Improvement Housing Social Support Transportation  ADL's:  Intact  Cognition: WNL  Sleep: Overall okay except for having weird dreams some nights   Screenings: [place] Video Visit from 10/02/2020 in [place] Total Score 2      [place] Visit from 09/07/2020 in [place] Video Visit from 08/31/2020 in [place] Video Visit from 06/29/2020 in [place] Visit from 06/08/2020 in [place] Office Visit from 05/04/2020 in [place]  Total GAD-7 Score _0 PHQ2-9    [place] from 09/22/2020 in [place] Visit from 09/07/2020 in [place] Video Visit from 08/31/2020 in [place] Video Visit from 07/14/2020 in [place] Video Visit from 06/29/2020 in [place]  PHQ-2 Total Score _1 PHQ-9 Total Score -- _2 Flowsheet Row Counselor from 10/23/2020 in [place] from  09/22/2020 in [place] Counselor from 08/11/2020 in [place] No Risk No Risk No Risk        Assessment and Plan: Latonga Ponder is a 30 year old Caucasian female, lives in [place], married, has a history of depression, anxiety, PCOS, chronic pain was evaluated by telemedicine today.  Patient with multiple psychosocial stressors including health problems of her daughter.  Patient was recently tested for ADHD, completed neuropsychological testing-was diagnosed with ADHD.  She will benefit from the following plan.  Plan MDD in remission Abilify at reduced dose of 5 mg p.o. daily.  We will continue to taper it off as needed. Celexa 10 mg p.o. daily Lamotrigine 250 mg p.o. daily  GAD-improving Celexa 10 mg p.o. daily Hydroxyzine 25-50 mg p.o. daily as needed for severe anxiety Klonopin 0.5 mg as needed for severe panic attacks Continue CBT with Ms. Christina Hussami  Insomnia-stable Trazodone 50 mg p.o. 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  ADHD-unstable Reviewed and discussed neuropsychological testing dated 11/09/2020-Dr. Rodenbough.  Patient meets criteria for ADHD. Discussed psychostimulants. We will start Adderall extended release-5 mg p.o. daily.  We will gradually increase the dosage as needed.  Provided medication education including drug to drug interaction, serotonin syndrome, exacerbation of migraine headaches which she has. Discussed the need for monitoring blood pressure, heart rate. Reviewed most recent blood  pressure-dated 10/30/2020-[facility]-BP-129/83, pulse-95. Reviewed EKG in the system-dated 07/19/2020-normal sinus rhythm.   High risk medication use-will order a urine drug screen.  She will [facility].    Follow-up in clinic in 10 days in person.  This note was generated in part or whole with voice recognition software. Voice recognition is usually quite accurate but there are transcription errors that can and very often do occur. I apologize for any typographical errors that were not detected and corrected.        Ursula Alert, MD 11/16/2020, 11:59 AM

## 2020-11-15 NOTE — Patient Instructions (Signed)
Amphetamine; Dextroamphetamine Extended-Release Capsules What is this medication? AMPHETAMINE; DEXTROAMPHETAMINE (am FET a meen; dex troe am FET a meen) treats attention-deficit hyperactivity disorder (ADHD). It works by improving focus and reducing impulsive behavior. It belongs to a group of medications called stimulants. This medicine may be used for other purposes; ask your health care provider or pharmacist if you have questions. COMMON BRAND NAME(S): Adderall XR, Mydayis What should I tell my care team before I take this medication? They need to know if you have any of these conditions: Anxiety or panic attacks Circulation problems in fingers and toes (Raynaud's disease) Glaucoma Heart attack Heart disease High blood pressure History of alcohol or drug abuse or addiction Kidney disease Liver disease Mental health disease Previous suicide attempt by you or a family member Seizures Stroke Suicidal thoughts, plans, or attempt Thyroid disease Tourette's syndrome An unusual or allergic reaction to dextroamphetamine, other amphetamines, other medications, foods, dyes, or preservatives Pregnant or trying to get pregnant Breast-feeding How should I use this medication? Take this medication by mouth with water. Take it as directed on the prescription label at the same time every day. You can take it with or without food. If it upsets your stomach, take it with food. Do not cut, crush, or chew this medication. Swallow the capsules whole. You may open the capsule and put the contents in 1 teaspoon of applesauce. Swallow the medication and applesauce right away. Do not chew the medication or applesauce. Keep taking it unless your care team tells you to stop. A special MedGuide will be given to you by the pharmacist with each prescription and refill. Be sure to read this information carefully each time. Talk to your care team about the use of this medication in children. While it may be  prescribed for children as young as 6 years for selected conditions, precautions do apply. Overdosage: If you think you have taken too much of this medicine contact a poison control center or emergency room at once. NOTE: This medicine is only for you. Do not share this medicine with others. What if I miss a dose? If you miss a dose, take it as soon as you can in the morning, but do not take it later in the day because it can cause trouble sleeping. If it is almost time for your next dose, take only that dose. Do not take double or extra doses. What may interact with this medication? Do not take this medication with any of the following: Linezolid MAOIs like Carbex, Eldepryl, Marplan, Nardil, and Parnate Methylene blue (injected into a vein) Other stimulant medications for attention disorders, weight loss, or to stay awake This medication may also interact with the following: Acetazolamide Ammonium chloride Ascorbic acid Atomoxetine Certain medications for blood pressure, heart disease, irregular heart beat Certain medications for depression, anxiety, or psychotic disturbances Cold or allergy medications Lithium Methenamine Narcotic medicines for pain Quinidine Ritonavir Sodium bicarbonate St. John's Wort Tryptophan This list may not describe all possible interactions. Give your health care provider a list of all the medicines, herbs, non-prescription drugs, or dietary supplements you use. Also tell them if you smoke, drink alcohol, or use illegal drugs. Some items may interact with your medicine. What should I watch for while using this medication? Visit your care team for regular checks on your progress. This prescription requires that you follow special procedures with your care team and pharmacy. You will need to have a new written prescription from your care team every time you   need a refill. This medication may affect your concentration, or hide signs of tiredness. Until you know  how this medication affects you, do not drive, ride a bicycle, use machinery, or do anything that needs mental alertness. Alcohol should be avoided with some brands of this medication. Talk to your care team if you have questions. Tell your care team if this medication loses its effects, or if you feel you need to take more than the prescribed amount. Do not change the dosage without talking to your care team. Decreased appetite is a common side effect when starting this medication. Eating small, frequent meals or snacks can help. Talk to your care team if you continue to have poor eating habits. Height and weight growth of a child taking this medication will be monitored closely. Do not take this medication close to bedtime. It may prevent you from sleeping. Tell your care team right away if you notice unexplained wounds on your fingers and toes while taking this medication. You should also tell your care team if you experience numbness or pain, changes in the skin color, or sensitivity to temperature in your fingers or toes. What side effects may I notice from receiving this medication? Side effects that you should report to your care team as soon as possible: Allergic reactions-skin rash, itching, hives, swelling of the face, lips, tongue, or throat Raynaud's-cool, numb, or painful fingers or toes that may change color from pale, to blue, to red Heart rhythm changes-fast or irregular heartbeat, dizziness, feeling faint or lightheaded, chest pain, trouble breathing Increase in blood pressure Mood and behavior changes-anxiety, nervousness, confusion, hallucinations, irritability, hostility, thoughts of suicide or self-harm, worsening mood, feelings of depression Painful or prolonged erections Seizures Stroke in adults-sudden numbness or weakness of the face, arm or leg, trouble speaking, confusion, trouble walking, loss of balance or coordination, dizziness, severe headache, change in vision Side  effects that usually do not require medical attention (report to your care team if they continue or are bothersome): Blurry vision Headache Loss of appetite Nausea Trouble sleeping Weight loss This list may not describe all possible side effects. Call your doctor for medical advice about side effects. You may report side effects to FDA at 1-800-FDA-1088. Where should I keep my medication? Keep out of the reach of children and pets. This medication can be abused. Keep it in a safe place to protect it from theft. Do not share it with anyone. It is only for you. Selling or giving away this medication is dangerous and against the law. Store at room temperature between 15 and 30 degrees C (59 and 86 degrees F). Protect from light and moisture. Keep container tightly closed. Get rid of any unused medication after the expiration date. This medication may cause harm and death if it is taken by other adults, children, or pets. It is important to get rid of the medication as soon as you no longer need it or it is expired. You can do this in two ways: Take the medication to a medication take-back program. Check with your pharmacy or law enforcement to find a location. If you cannot return the medication, check the label or package insert to see if the medication should be thrown out in the garbage or flushed down the toilet. If you are not sure, ask your care team. If it is safe to put it in the trash, take the medication out of the container. Mix the medication with cat litter, dirt, coffee grounds, or other   unwanted substance. Seal the mixture in a bag or container. Put it in the trash. NOTE: This sheet is a summary. It may not cover all possible information. If you have questions about this medicine, talk to your doctor, pharmacist, or health care provider.  2022 Elsevier/Gold Standard (2020-05-24 12:51:46)  

## 2020-11-19 LAB — URINE DRUGS OF ABUSE SCREEN W ALC, ROUTINE (REF LAB)
Amphetamines, Urine: NEGATIVE ng/mL
Benzodiazepine Quant, Ur: NEGATIVE ng/mL
Cannabinoid Quant, Ur: NEGATIVE ng/mL
Cocaine (Metab.): NEGATIVE ng/mL
Ethanol U, Quan: NEGATIVE %
Methadone Screen, Urine: NEGATIVE ng/mL
Opiate Quant, Ur: NEGATIVE ng/mL
Phencyclidine, Ur: NEGATIVE ng/mL
Propoxyphene, Urine: NEGATIVE ng/mL

## 2020-11-19 LAB — DRUG PROFILE 799023
Amobarbital, Ur: NEGATIVE
BARBITURATES: POSITIVE — AB
Butalbital GC/MS: 432 ng/mL
Butalbital, Ur: POSITIVE — AB
Pentobarbital, Ur: NEGATIVE
Phenobarbital,Ur: NEGATIVE
Secobarbital, Ur: NEGATIVE

## 2020-11-23 ENCOUNTER — Ambulatory Visit (INDEPENDENT_AMBULATORY_CARE_PROVIDER_SITE_OTHER): Payer: 59 | Admitting: Psychiatry

## 2020-11-23 ENCOUNTER — Other Ambulatory Visit: Payer: Self-pay

## 2020-11-23 ENCOUNTER — Encounter: Payer: Self-pay | Admitting: Psychiatry

## 2020-11-23 VITALS — BP 127/91 | HR 93 | Temp 98.8°F | Wt 207.8 lb

## 2020-11-23 DIAGNOSIS — F900 Attention-deficit hyperactivity disorder, predominantly inattentive type: Secondary | ICD-10-CM

## 2020-11-23 DIAGNOSIS — F411 Generalized anxiety disorder: Secondary | ICD-10-CM

## 2020-11-23 DIAGNOSIS — F4312 Post-traumatic stress disorder, chronic: Secondary | ICD-10-CM

## 2020-11-23 DIAGNOSIS — F3342 Major depressive disorder, recurrent, in full remission: Secondary | ICD-10-CM | POA: Diagnosis not present

## 2020-11-23 DIAGNOSIS — F5105 Insomnia due to other mental disorder: Secondary | ICD-10-CM | POA: Diagnosis not present

## 2020-11-23 MED ORDER — MELATONIN 3 MG PO TABS: 3.0000 mg | ORAL_TABLET | Freq: Every day | ORAL | 0 refills | Status: DC

## 2020-11-23 MED ORDER — ARIPIPRAZOLE 5 MG PO TABS: 2.5000 mg | ORAL_TABLET | Freq: Every day | ORAL | 1 refills | Status: DC

## 2020-11-23 MED ORDER — AMPHETAMINE-DEXTROAMPHET ER 20 MG PO CP24: 20.0000 mg | ORAL_CAPSULE | Freq: Every day | ORAL | 0 refills | Status: DC

## 2020-11-23 NOTE — Progress Notes (Signed)
[facility] [facility] MD OP Progress Note  11/23/2020 5:14 PM Adyn Hoes  MRN:  [id number]  Chief Complaint:  Chief Complaint   Follow-up; ADD; Insomnia    HPI: Denise Walker is a 30 year old Caucasian female, stay-at-home mom, lives in [facility], has a history of MDD, GAD, history of ADHD was evaluated in office today.  Patient today reports mood wise she is doing fairly well.  Her anxiety seems to be getting better.  She is functioning okay although she does have anxiety.  Patient however reports she continues to struggle with attention, focus, organization skills which does have an impact on her managing her household chores.  She is tolerating the Adderall 5 mg well.  Denies any side effects.  She continues to struggle with sleep.  She reports 2-3 times a week she has sleep problems.  She is currently not taking any medication for sleep.  The trazodone gives her a headache and she tries not to take it.  Patient is compliant on her Abilify 5 mg and reports she is tolerating the lower dosage well.  Denies any suicidality, homicidality or perceptual disturbances.  Patient denies any other concerns today.  Visit Diagnosis:    ICD-10-CM   1. MDD (major depressive disorder), recurrent, in full remission ([place])  F33.42 ARIPiprazole (ABILIFY) 5 MG tablet    2. GAD (generalized anxiety disorder)  F41.1 ARIPiprazole (ABILIFY) 5 MG tablet    3. Insomnia due to mental disorder  F51.05    anxiety    4. ADHD (attention deficit hyperactivity disorder), inattentive type  F90.0 amphetamine-dextroamphetamine (ADDERALL XR) 20 MG 24 hr capsule    5. Chronic post-traumatic stress disorder (PTSD)  F43.12       Past Psychiatric History: Reviewed past psychiatric history from progress note on 06/29/2020.  Past trials of medications like Lexapro, Zoloft, Celexa, Trintellix, Concerta, Ritalin, Wellbutrin.  Patient had neuropsychological testing completed on 11/08/2020 and was diagnosed with ADHD.  Past Medical  History:  Past Medical History:  Diagnosis Date   Anxiety    Back pain    low back pain with lumbar radiculitis   Bilateral shoulder pain    BRCA negative 04/2018   MyRisk neg; IBIS=8.9%/riskscore=6.7%   Eclampsia    Family history of ovarian cancer    Gestational diabetes    Migraine headache    PCOS (polycystic ovarian syndrome)     Past Surgical History:  Procedure Laterality Date   CESAREAN SECTION N/A 07/15/2014   Procedure: CESAREAN SECTION;  Surgeon: Gae Dry, MD;  Location: [facility] ORS;  Service: Obstetrics;  Laterality: N/A;   CESAREAN SECTION N/A 02/10/2018   Procedure: CESAREAN SECTION;  Surgeon: Will Bonnet, MD;  Location: [facility] ORS;  Service: Obstetrics;  Laterality: N/A;   CESAREAN SECTION N/A 12/31/2018   Procedure: CESAREAN SECTION;  Surgeon: [name], MD;  Location: [facility] ORS;  Service: Obstetrics;  Laterality: N/A;   CESAREAN SECTION     HERNIA REPAIR  10/2013   WISDOM TOOTH EXTRACTION      Family Psychiatric History: Reviewed family psychiatric history from progress note on 06/29/2020  Family History:  Family History  Problem Relation Age of Onset   Bipolar disorder Mother    Ovarian cancer Mother    Arthritis Mother    Ovarian cancer Maternal Aunt    Diabetes Paternal Aunt    Breast cancer Maternal Grandmother    Diabetes Paternal Grandfather    Uterine cancer Cousin    ADD / ADHD Daughter    ODD  Daughter     Social History: Reviewed social history from progress note on 06/29/2020 Social History   Socioeconomic History   Marital status: Married    Spouse name: Einar Pheasant   Number of children: 3   Years of education: high school   Highest education level: Not on file  Occupational History   Not on file  Tobacco Use   Smoking status: Never   Smokeless tobacco: Never  Vaping Use   Vaping Use: Never used  Substance and Sexual Activity   Alcohol use: Yes    Comment: occasional wine cooler   Drug use: Never   Sexual activity: Yes     Birth control/protection: I.U.D.    Comment: will discuss with MD  Other Topics Concern   Not on file  Social History Narrative   Not on file   Social Determinants of Health   Financial Resource Strain: Not on file  Food Insecurity: Not on file  Transportation Needs: Not on file  Physical Activity: Not on file  Stress: Not on file  Social Connections: Not on file    Allergies:  Allergies  Allergen Reactions   Fish Allergy Shortness Of Breath   Shellfish Allergy Shortness Of Breath    Metabolic Disorder Labs: Lab Results  Component Value Date   HGBA1C 5.5 06/08/2020   Lab Results  Component Value Date   PROLACTIN 8.8 03/27/2017   Lab Results  Component Value Date   CHOL 175 06/08/2020   TRIG 189 (H) 06/08/2020   HDL 47 06/08/2020   CHOLHDL 3.7 06/08/2020   LDLCALC 96 06/08/2020   Lab Results  Component Value Date   TSH 4.350 09/22/2019   TSH 2.170 03/27/2017    Therapeutic Level Labs: No results found for: LITHIUM No results found for: VALPROATE No components found for:  CBMZ  Current Medications: Current Outpatient Medications  Medication Sig Dispense Refill   amphetamine-dextroamphetamine (ADDERALL XR) 20 MG 24 hr capsule Take 1 capsule (20 mg total) by mouth daily. 30 capsule 0   ARIPiprazole (ABILIFY) 5 MG tablet Take 0.5 tablets (2.5 mg total) by mouth daily. Dose change 30 tablet 1   benztropine (COGENTIN) 0.5 MG tablet TAKE 1 TABLET (0.5 MG TOTAL) BY MOUTH DAILY AS NEEDED FOR TREMORS. 30 tablet 1   Butalbital-APAP-Caffeine 50-325-40 MG capsule Take by mouth.     citalopram (CELEXA) 10 MG tablet TAKE 1 TABLET BY MOUTH EVERY DAY 90 tablet 0   clotrimazole-betamethasone (LOTRISONE) cream Apply topically 2 (two) times daily.     cyclobenzaprine (FLEXERIL) 10 MG tablet Take 1 tablet by mouth as needed.     fluticasone (FLONASE) 50 MCG/ACT nasal spray Place into the nose.     hydrOXYzine (ATARAX/VISTARIL) 25 MG tablet TAKE 1-2 TABLETS (25-50 MG  TOTAL) BY MOUTH DAILY AS NEEDED FOR ANXIETY. 180 tablet 0   lamoTRIgine (LAMICTAL) 200 MG tablet Take 1 tablet (200 mg total) by mouth daily. 90 tablet 0   lamoTRIgine (LAMICTAL) 25 MG tablet TAKE 2 TABLETS (50 MG TOTAL) BY MOUTH DAILY. TAKE ALONG WITH 200 MG DAILY - TOTAL OF 250 MG DAILY 180 tablet 0   levonorgestrel (MIRENA) 20 MCG/24HR IUD by Intrauterine route.     melatonin 3 MG TABS tablet Take 1 tablet (3 mg total) by mouth at bedtime. 30 tablet 0   naproxen (NAPROSYN) 500 MG tablet 1 po bid prn     No current facility-administered medications for this visit.     Musculoskeletal: Strength & Muscle Tone: within normal  limits Gait & Station: normal Patient leans: N/A  Psychiatric Specialty Exam: Review of Systems  Psychiatric/Behavioral:  Positive for decreased concentration and sleep disturbance. The patient is nervous/anxious.   All other systems reviewed and are negative.  Blood pressure (!) 127/91, pulse 93, temperature 98.8 F (37.1 C), temperature source Temporal, weight 207 lb 12.8 oz (94.3 kg), not currently breastfeeding.Body mass index is 41.97 kg/m.  General Appearance: Casual  Eye Contact:  Fair  Speech:  Clear and Coherent  Volume:  Normal  Mood:  Anxious  Affect:  Congruent  Thought Process:  Goal Directed and Descriptions of Associations: Intact  Orientation:  Full (Time, Place, and Person)  Thought Content: Logical   Suicidal Thoughts:  No  Homicidal Thoughts:  No  Memory:  Immediate;   Fair Recent;   Fair Remote;   Fair  Judgement:  Fair  Insight:  Fair  Psychomotor Activity:  Normal  Concentration:  Concentration: Fair and Attention Span: Fair  Recall:  [employer] of Knowledge: Fair  Language: Fair  Akathisia:  No  Handed:  Left  AIMS (if indicated): done  Assets:  Communication Skills Desire for Improvement Social Support Talents/Skills  ADL's:  Intact  Cognition: WNL  Sleep:  Poor   Screenings: AIMS    Flowsheet Row Video Visit from  10/02/2020 in [place] Total Score 2      [place] Visit from 09/07/2020 in [place] Video Visit from 08/31/2020 in [place] Video Visit from 06/29/2020 in [place] Visit from 06/08/2020 in [place] Office Visit from 05/04/2020 in [place]  Total GAD-7 Score 12 14 17 11 14       PHQ2-9    [place] Visit from 11/23/2020 in [place] from 09/22/2020 in [place] Visit from 09/07/2020 in [place] Video Visit from 08/31/2020 in [place] Video Visit from 07/14/2020 in [place]  PHQ-2 Total Score 2 2 2 2 6   PHQ-9 Total Score 11 -- 11 13 20       Flowsheet Row Counselor from 10/23/2020 in [place] from 09/22/2020 in [place] from 08/11/2020 in [place] No Risk No Risk No Risk        Assessment and Plan: Evanie Buckle is 30 year old Caucasian female, lives in [place], married, has a history of depression, anxiety, PCOS, chronic pain was evaluated in office today.  Patient continues to struggle with attention and concentration problems as well as sleep problems.  She will benefit from the following plan.  Plan MDD in remission Abilify, we will reduce dosage to 2.5 mg p.o. daily. Celexa 10 mg p.o. daily Lamotrigine to 50 mg p.o. daily  GAD-stable Celexa 10 mg p.o. daily Hydroxyzine 25-50 mg p.o. daily as needed for severe anxiety Klonopin 0.5 mg as needed for severe panic attacks Continue CBT with Ms. Christina Hussami  Insomnia-unstable Discontinue trazodone for side effects. Start melatonin 3 to 5 mg p.o. nightly as  needed Sleep hygiene techniques discussed.  ADHD-unstable Increase Adderall extended release to 20 mg p.o. daily I have reviewed [place] PMP AWARE  Chronic PTSD-stable She will continue to manage psychotherapy sessions.  Provided medication education including drug to drug interaction, serotonin syndrome, exacerbation of migraine headaches.  Patient to continue to monitor for side  effects as well as monitor blood pressure and heart rate.  Follow-up in clinic in 3 weeks or sooner in person.  This note was generated in part or whole with voice recognition software. Voice recognition is usually quite accurate but there are transcription errors that can and very often do occur. I apologize for any typographical errors that were not detected and corrected.      Ursula Alert, MD 11/24/2020, 1:11 PM

## 2020-12-11 ENCOUNTER — Ambulatory Visit: Payer: Medicaid Other | Admitting: Licensed Clinical Social Worker

## 2020-12-11 ENCOUNTER — Other Ambulatory Visit: Payer: Self-pay

## 2020-12-13 ENCOUNTER — Ambulatory Visit: Payer: 59 | Admitting: Psychology

## 2020-12-13 ENCOUNTER — Ambulatory Visit: Payer: Medicaid Other | Admitting: Psychiatry

## 2020-12-21 ENCOUNTER — Other Ambulatory Visit: Payer: Self-pay

## 2020-12-21 ENCOUNTER — Telehealth (INDEPENDENT_AMBULATORY_CARE_PROVIDER_SITE_OTHER): Payer: 59 | Admitting: Psychiatry

## 2020-12-21 ENCOUNTER — Encounter: Payer: Self-pay | Admitting: Psychiatry

## 2020-12-21 DIAGNOSIS — F900 Attention-deficit hyperactivity disorder, predominantly inattentive type: Secondary | ICD-10-CM

## 2020-12-21 DIAGNOSIS — F3342 Major depressive disorder, recurrent, in full remission: Secondary | ICD-10-CM | POA: Diagnosis not present

## 2020-12-21 DIAGNOSIS — F411 Generalized anxiety disorder: Secondary | ICD-10-CM

## 2020-12-21 DIAGNOSIS — F5105 Insomnia due to other mental disorder: Secondary | ICD-10-CM

## 2020-12-21 DIAGNOSIS — F4312 Post-traumatic stress disorder, chronic: Secondary | ICD-10-CM

## 2020-12-21 MED ORDER — AMPHETAMINE-DEXTROAMPHETAMINE 20 MG PO TABS: 10.0000 mg | ORAL_TABLET | Freq: Two times a day (BID) | ORAL | 0 refills | Status: DC

## 2020-12-21 NOTE — Progress Notes (Signed)
Virtual Visit via Video Note  I connected with Denise Walker on 12/21/20 at 10:30 AM EST by a video enabled telemedicine application and verified that I am speaking with the correct person using two identifiers. Location Provider Location : [facility] Patient Location : Home  Participants: Patient , Provider   I discussed the limitations of evaluation and management by telemedicine and the availability of in person appointments. The patient expressed understanding and agreed to proceed.   I discussed the assessment and treatment plan with the patient. The patient was provided an opportunity to ask questions and all were answered. The patient agreed with the plan and demonstrated an understanding of the instructions.   The patient was advised to call back or seek an in-person evaluation if the symptoms worsen or if the condition fails to improve as anticipated.    [place] MD OP Progress Note  12/21/2020 11:04 AM Denise Walker  MRN:  [id number]  Chief Complaint:  Chief Complaint   Follow-up; ADD; Anxiety    HPI: Denise Walker is a 30 year old Caucasian female, stay-at-home mom, lives in [facility], has a history of MDD, GAD, history of ADHD was evaluated by telemedicine today.  Patient today reports she is currently sick possibly with a stomach virus.  She reports her whole family has been sick back-to-back since the past 3 weeks or so.  Her kids have recovered however she continues to struggle with GI symptoms and fatigue.  Patient reports she is currently on a lower dosage of Abilify and has not noticed any significant depression or anxiety symptoms.  She has not been able to start the Adderall extended release 20 mg which was prescribed last visit, she reports her pharmacy could not get it for her and she was told that it is on backorder.  She reports she is interested in changing this medication if possible.  While in session pharmacy was contacted and was told that they have Adderall  immediate release and not the extended release.  Patient interested in switching to Adderall immediate release.  Patient denies any suicidality, homicidality or perceptual disturbances.  Patient denies any other concerns today.  Visit Diagnosis:    ICD-10-CM   1. MDD (major depressive disorder), recurrent, in full remission ([place])  F33.42     2. GAD (generalized anxiety disorder)  F41.1     3. Insomnia due to mental disorder  F51.05    Anxiety    4. ADHD (attention deficit hyperactivity disorder), inattentive type  F90.0 amphetamine-dextroamphetamine (ADDERALL) 20 MG tablet    5. Chronic post-traumatic stress disorder (PTSD)  F43.12       Past Psychiatric History: Reviewed past psychiatric history from progress note on 06/29/2020.  Past trials of medications like Lexapro, Zoloft, Celexa, Trintellix, Concerta, Ritalin, Wellbutrin.  Patient had neuropsychological testing completed-11/08/2020-diagnosed with ADHD.  Past Medical History:  Past Medical History:  Diagnosis Date   Anxiety    Back pain    low back pain with lumbar radiculitis   Bilateral shoulder pain    BRCA negative 04/2018   MyRisk neg; IBIS=8.9%/riskscore=6.7%   Eclampsia    Family history of ovarian cancer    Gestational diabetes    Migraine headache    PCOS (polycystic ovarian syndrome)     Past Surgical History:  Procedure Laterality Date   CESAREAN SECTION N/A 07/15/2014   Procedure: CESAREAN SECTION;  Surgeon: Gae Dry, MD;  Location: [facility] ORS;  Service: Obstetrics;  Laterality: N/A;   CESAREAN SECTION N/A 02/10/2018  Procedure: CESAREAN SECTION;  Surgeon: Will Bonnet, MD;  Location: [facility] ORS;  Service: Obstetrics;  Laterality: N/A;   CESAREAN SECTION N/A 12/31/2018   Procedure: CESAREAN SECTION;  Surgeon: [name], MD;  Location: [facility] ORS;  Service: Obstetrics;  Laterality: N/A;   CESAREAN SECTION     HERNIA REPAIR  10/2013   WISDOM TOOTH EXTRACTION      Family Psychiatric  History: Reviewed family psychiatric history from progress note on 06/29/2020  Family History:  Family History  Problem Relation Age of Onset   Bipolar disorder Mother    Ovarian cancer Mother    Arthritis Mother    Ovarian cancer Maternal Aunt    Diabetes Paternal Aunt    Breast cancer Maternal Grandmother    Diabetes Paternal Grandfather    Uterine cancer Cousin    ADD / ADHD Daughter    ODD Daughter     Social History: Reviewed social history from progress note on 06/29/2020 Social History   Socioeconomic History   Marital status: Married    Spouse name: Einar Pheasant   Number of children: 3   Years of education: high school   Highest education level: Not on file  Occupational History   Not on file  Tobacco Use   Smoking status: Never   Smokeless tobacco: Never  Vaping Use   Vaping Use: Never used  Substance and Sexual Activity   Alcohol use: Yes    Comment: occasional wine cooler   Drug use: Never   Sexual activity: Yes    Birth control/protection: I.U.D.    Comment: will discuss with MD  Other Topics Concern   Not on file  Social History Narrative   Not on file   Social Determinants of Health   Financial Resource Strain: Not on file  Food Insecurity: Not on file  Transportation Needs: Not on file  Physical Activity: Not on file  Stress: Not on file  Social Connections: Not on file    Allergies:  Allergies  Allergen Reactions   Fish Allergy Shortness Of Breath   Shellfish Allergy Shortness Of Breath    Metabolic Disorder Labs: Lab Results  Component Value Date   HGBA1C 5.5 06/08/2020   Lab Results  Component Value Date   PROLACTIN 8.8 03/27/2017   Lab Results  Component Value Date   CHOL 175 06/08/2020   TRIG 189 (H) 06/08/2020   HDL 47 06/08/2020   CHOLHDL 3.7 06/08/2020   LDLCALC 96 06/08/2020   Lab Results  Component Value Date   TSH 4.350 09/22/2019   TSH 2.170 03/27/2017    Therapeutic Level Labs: No results found for: LITHIUM No  results found for: VALPROATE No components found for:  CBMZ  Current Medications: Current Outpatient Medications  Medication Sig Dispense Refill   amphetamine-dextroamphetamine (ADDERALL) 20 MG tablet Take 0.5 tablets (10 mg total) by mouth 2 (two) times daily. Take half tablet daily at 7 AM and half tablet daily at 1 PM 30 tablet 0   ARIPiprazole (ABILIFY) 5 MG tablet Take 0.5 tablets (2.5 mg total) by mouth daily. Dose change 30 tablet 1   benztropine (COGENTIN) 0.5 MG tablet TAKE 1 TABLET (0.5 MG TOTAL) BY MOUTH DAILY AS NEEDED FOR TREMORS. 30 tablet 1   Butalbital-APAP-Caffeine 50-325-40 MG capsule Take by mouth.     citalopram (CELEXA) 10 MG tablet TAKE 1 TABLET BY MOUTH EVERY DAY 90 tablet 0   clotrimazole-betamethasone (LOTRISONE) cream Apply topically 2 (two) times daily.     cyclobenzaprine (FLEXERIL)  10 MG tablet Take 1 tablet by mouth as needed.     hydrOXYzine (ATARAX/VISTARIL) 25 MG tablet TAKE 1-2 TABLETS (25-50 MG TOTAL) BY MOUTH DAILY AS NEEDED FOR ANXIETY. 180 tablet 0   lamoTRIgine (LAMICTAL) 200 MG tablet Take 1 tablet (200 mg total) by mouth daily. 90 tablet 0   lamoTRIgine (LAMICTAL) 25 MG tablet TAKE 2 TABLETS (50 MG TOTAL) BY MOUTH DAILY. TAKE ALONG WITH 200 MG DAILY - TOTAL OF 250 MG DAILY 180 tablet 0   levonorgestrel (MIRENA) 20 MCG/24HR IUD by Intrauterine route.     melatonin 3 MG TABS tablet Take 1 tablet (3 mg total) by mouth at bedtime. 30 tablet 0   naproxen (NAPROSYN) 500 MG tablet 1 po bid prn     No current facility-administered medications for this visit.     Musculoskeletal: Strength & Muscle Tone:  UTA Gait & Station:  Seated Patient leans: N/A  Psychiatric Specialty Exam: Review of Systems  Constitutional:  Positive for appetite change and fatigue.  Psychiatric/Behavioral:  Positive for decreased concentration. The patient is nervous/anxious.   All other systems reviewed and are negative.  not currently breastfeeding.There is no height or  weight on file to calculate BMI.  General Appearance: Casual  Eye Contact:  Fair  Speech:  Clear and Coherent  Volume:  Normal  Mood:  Anxious  Affect:  Congruent  Thought Process:  Goal Directed and Descriptions of Associations: Intact  Orientation:  Full (Time, Place, and Person)  Thought Content: Logical   Suicidal Thoughts:  No  Homicidal Thoughts:  No  Memory:  Immediate;   Fair Recent;   Fair Remote;   Fair  Judgement:  Fair  Insight:  Fair  Psychomotor Activity:  Normal  Concentration:  Concentration: Fair and Attention Span: Fair  Recall:  [employer] of Knowledge: Fair  Language: Fair  Akathisia:  No  Handed:  Left  AIMS (if indicated): not done  Assets:  Communication Skills Desire for Improvement Housing Social Support Transportation  ADL's:  Intact  Cognition: WNL  Sleep:  Fair   Screenings: AIMS    Flowsheet Row Video Visit from 10/02/2020 in [place] Total Score 2      [place] Office Visit from 09/07/2020 in [place] Video Visit from 08/31/2020 in [place] Video Visit from 06/29/2020 in [place] Visit from 06/08/2020 in [place] Office Visit from 05/04/2020 in [place]  Total GAD-7 Score 12 14 17 11 14       PHQ2-9    [place] Visit from 11/23/2020 in [place] from 09/22/2020 in [place] Visit from 09/07/2020 in [place] Video Visit from 08/31/2020 in [place] Video Visit from 07/14/2020 in [place]  PHQ-2 Total Score 2 2 2 2 6   PHQ-9 Total Score 11 -- 11 13 20       Flowsheet Row Counselor from 10/23/2020 in [place] from 09/22/2020 in [place]  from 08/11/2020 in [place] No Risk No Risk No Risk        Assessment and Plan: Cecilia Vancleve is a 30 year old Caucasian female, lives in [place], married, has a history of depression, anxiety, PCOS, chronic pain was evaluated by telemedicine today.  Patient is currently struggling with GI symptoms however  recovering.  Patient has not been able to start her medication as prescribed last visit due to it being on backorder.  Discussed plan as noted below.  Plan MDD in remission Abilify 2.5 mg p.o. daily reduced dosage Celexa 10 mg p.o. daily Lamotrigine 250 mg p.o. daily  GAD-stable Celexa 10 mg p.o. daily Hydroxyzine 25-50 mg p.o. daily as needed for severe anxiety Klonopin 0.5 mg as needed for severe panic attacks Continue CBT with Ms. Christina Hussami  Insomnia-improving Melatonin 3 to 5 mg p.o. nightly as needed Continue sleep hygiene  ADHD-unstable Discontinue Adderall extended release 20 mg since it is on backorder. Start Adderall 10 mg p.o. twice daily. Reviewed [place] PMP aware Pharmacy was contacted while in session as noted above.  Chronic PTSD-stable Continue CBT  Patient to follow up with primary care provider for her GI symptoms, currently recovering from stomach virus.  Follow-up in clinic in 3 to 4 weeks or sooner in person.  This note was generated in part or whole with voice recognition software. Voice recognition is usually quite accurate but there are transcription errors that can and very often do occur. I apologize for any typographical errors that were not detected and corrected.      Ursula Alert, MD 12/21/2020, 11:04 AM

## 2020-12-22 ENCOUNTER — Telehealth: Payer: 59 | Admitting: Emergency Medicine

## 2020-12-22 DIAGNOSIS — B9689 Other specified bacterial agents as the cause of diseases classified elsewhere: Secondary | ICD-10-CM | POA: Diagnosis not present

## 2020-12-22 DIAGNOSIS — J019 Acute sinusitis, unspecified: Secondary | ICD-10-CM | POA: Diagnosis not present

## 2020-12-22 MED ORDER — AMOXICILLIN-POT CLAVULANATE 875-125 MG PO TABS: 1.0000 | ORAL_TABLET | Freq: Two times a day (BID) | ORAL | 0 refills | Status: DC

## 2020-12-22 NOTE — Patient Instructions (Addendum)
Denise Walker, thank you for joining [name], NP for today's virtual visit.  While this provider is not your primary care provider (PCP), if your PCP is located in our provider database this encounter information will be shared with them immediately following your visit.  Consent: (Patient) Denise Walker provided verbal consent for this virtual visit at the beginning of the encounter.  Current Medications:  Current Outpatient Medications:    amoxicillin-clavulanate (AUGMENTIN) 875-125 MG tablet, Take 1 tablet by mouth 2 (two) times daily., Disp: 14 tablet, Rfl: 0   amphetamine-dextroamphetamine (ADDERALL) 20 MG tablet, Take 0.5 tablets (10 mg total) by mouth 2 (two) times daily. Take half tablet daily at 7 AM and half tablet daily at 1 PM, Disp: 30 tablet, Rfl: 0   ARIPiprazole (ABILIFY) 5 MG tablet, Take 0.5 tablets (2.5 mg total) by mouth daily. Dose change, Disp: 30 tablet, Rfl: 1   benztropine (COGENTIN) 0.5 MG tablet, TAKE 1 TABLET (0.5 MG TOTAL) BY MOUTH DAILY AS NEEDED FOR TREMORS., Disp: 30 tablet, Rfl: 1   Butalbital-APAP-Caffeine 50-325-40 MG capsule, Take by mouth., Disp: , Rfl:    citalopram (CELEXA) 10 MG tablet, TAKE 1 TABLET BY MOUTH EVERY DAY, Disp: 90 tablet, Rfl: 0   clotrimazole-betamethasone (LOTRISONE) cream, Apply topically 2 (two) times daily., Disp: , Rfl:    cyclobenzaprine (FLEXERIL) 10 MG tablet, Take 1 tablet by mouth as needed., Disp: , Rfl:    hydrOXYzine (ATARAX/VISTARIL) 25 MG tablet, TAKE 1-2 TABLETS (25-50 MG TOTAL) BY MOUTH DAILY AS NEEDED FOR ANXIETY., Disp: 180 tablet, Rfl: 0   lamoTRIgine (LAMICTAL) 200 MG tablet, Take 1 tablet (200 mg total) by mouth daily., Disp: 90 tablet, Rfl: 0   lamoTRIgine (LAMICTAL) 25 MG tablet, TAKE 2 TABLETS (50 MG TOTAL) BY MOUTH DAILY. TAKE ALONG WITH 200 MG DAILY - TOTAL OF 250 MG DAILY, Disp: 180 tablet, Rfl: 0   levonorgestrel (MIRENA) 20 MCG/24HR IUD, by Intrauterine route., Disp: , Rfl:    melatonin 3 MG TABS  tablet, Take 1 tablet (3 mg total) by mouth at bedtime., Disp: 30 tablet, Rfl: 0   naproxen (NAPROSYN) 500 MG tablet, 1 po bid prn, Disp: , Rfl:    Medications ordered in this encounter:  Meds ordered this encounter  Medications   amoxicillin-clavulanate (AUGMENTIN) 875-125 MG tablet    Sig: Take 1 tablet by mouth 2 (two) times daily.    Dispense:  14 tablet    Refill:  0     *If you need refills on other medications prior to your next appointment, please contact your pharmacy*  Follow-Up: Call back or seek an in-person evaluation if the symptoms worsen or if the condition fails to improve as anticipated.  Other Instructions Use saline irrigation in your nose to help drain the congestion.  Use Mucinex and drink lots of liquids to try to help thin the congestion to help it drain.  Finish all of the antibiotics even if you are feeling better.   If you have been instructed to have an in-person evaluation today at a local Urgent Care facility, please use the link below. It will take you to a list of all of our available [facility], including address, phone number and hours of operation. Please do not delay care.  [facility]  If you or a family member do not have a primary care provider, use the link below to schedule a visit and establish care. When you choose a [facility] primary care  physician or advanced practice provider, you gain a long-term partner in health. Find a Primary Care Provider  Learn more about [facility]'s in-office and virtual care options: [facility]

## 2020-12-22 NOTE — Progress Notes (Signed)
Virtual Visit Consent   Denise Walker, you are scheduled for a virtual visit with a [facility] provider today.     Just as with appointments in the office, your consent must be obtained to participate.  Your consent will be active for this visit and any virtual visit you may have with one of our providers in the next 365 days.     If you have a MyChart account, a copy of this consent can be sent to you electronically.  All virtual visits are billed to your insurance company just like a traditional visit in the office.    As this is a virtual visit, video technology does not allow for your provider to perform a traditional examination.  This may limit your provider's ability to fully assess your condition.  If your provider identifies any concerns that need to be evaluated in person or the need to arrange testing (such as labs, EKG, etc.), we will make arrangements to do so.     Although advances in technology are sophisticated, we cannot ensure that it will always work on either your end or our end.  If the connection with a video visit is poor, the visit may have to be switched to a telephone visit.  With either a video or telephone visit, we are not always able to ensure that we have a secure connection.     I need to obtain your verbal consent now.   Are you willing to proceed with your visit today?    [name] has provided verbal consent on 12/22/2020 for a virtual visit (video or telephone).   Carvel Getting, NP   Date: 12/22/2020 9:38 AM   Virtual Visit via Video Note   I, Carvel Getting, connected with  Karielle Davidow  ([id number], [birth year]) on 12/22/20 at  9:30 AM EST by a video-enabled telemedicine application and verified that I am speaking with the correct person using two identifiers.  Location: Patient: Virtual Visit Location Patient: Home Provider: Virtual Visit Location Provider: Home Office   I discussed the limitations of evaluation and management by telemedicine  and the availability of in person appointments. The patient expressed understanding and agreed to proceed.    History of Present Illness: Denise Walker is a 30 y.o. who identifies as a female who was assigned female at birth, and is being seen today for purulent nasal discharge.  Patient reports she had a cold about a week and a half ago in the last couple of days her congestion has turned from clear to thick and green.  She has pressure over her bilateral maxillary and frontal sinuses, and she also has pressure in her ears.  She feels like when she hears through her right ear everything sounds like it is underwater.  She has been taking Mucinex without relief.  She also tried a Fioricet that she takes for migraines this morning for her headache associated with the sinus pressure, but it did not relieve her headache at all.  She has had a sinus infection in the past and this feels the same.   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   MDD (major depressive disorder), recurrent, in partial remission ([place]) 10/02/2020   Neuroleptic induced acute  dystonia 10/02/2020   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   Attention and concentration deficit 08/31/2020   Moderate episode of recurrent major depressive disorder ([place]) 06/29/2020   High risk medication use 06/29/2020   History of ADHD 06/29/2020   Uterine scar from previous cesarean delivery affecting pregnancy 12/31/2018   Anti-D antibodies present during pregnancy Jul 23, 2018   BRCA gene mutation negative 05/04/2018   History of cesarean delivery 02/10/2018   Rh negative state in antepartum period 08/23/2017   History of eclampsia  07/22/2017   Migraines 04/09/2017   GAD (generalized anxiety disorder) 12/01/2014    Allergies:  Allergies  Allergen Reactions   Fish Allergy Shortness Of Breath   Shellfish Allergy Shortness Of Breath   Medications:  Current Outpatient Medications:    amoxicillin-clavulanate (AUGMENTIN) 875-125 MG tablet, Take 1 tablet by mouth 2 (two) times daily., Disp: 14 tablet, Rfl: 0   amphetamine-dextroamphetamine (ADDERALL) 20 MG tablet, Take 0.5 tablets (10 mg total) by mouth 2 (two) times daily. Take half tablet daily at 7 AM and half tablet daily at 1 PM, Disp: 30 tablet, Rfl: 0   ARIPiprazole (ABILIFY) 5 MG tablet, Take 0.5 tablets (2.5 mg total) by mouth daily. Dose change, Disp: 30 tablet, Rfl: 1   benztropine (COGENTIN) 0.5 MG tablet, TAKE 1 TABLET (0.5 MG TOTAL) BY MOUTH DAILY AS NEEDED FOR TREMORS., Disp: 30 tablet, Rfl: 1   Butalbital-APAP-Caffeine 50-325-40 MG capsule, Take by mouth., Disp: , Rfl:    citalopram (CELEXA) 10 MG tablet, TAKE 1 TABLET BY MOUTH EVERY DAY, Disp: 90 tablet, Rfl: 0   clotrimazole-betamethasone (LOTRISONE) cream, Apply topically 2 (two) times daily., Disp: , Rfl:    cyclobenzaprine (FLEXERIL) 10 MG tablet, Take 1 tablet by mouth as needed., Disp: , Rfl:    hydrOXYzine (ATARAX/VISTARIL) 25 MG tablet, TAKE 1-2 TABLETS (25-50 MG TOTAL) BY MOUTH DAILY AS NEEDED FOR ANXIETY., Disp: 180 tablet, Rfl: 0   lamoTRIgine (LAMICTAL) 200 MG tablet, Take 1 tablet (200 mg total) by mouth daily., Disp: 90 tablet, Rfl: 0   lamoTRIgine (LAMICTAL) 25 MG tablet, TAKE 2 TABLETS (50 MG TOTAL) BY MOUTH DAILY. TAKE ALONG WITH 200 MG DAILY - TOTAL OF 250 MG DAILY, Disp: 180 tablet, Rfl: 0   levonorgestrel (MIRENA) 20 MCG/24HR IUD, by Intrauterine route., Disp: , Rfl:    melatonin 3 MG TABS tablet, Take 1 tablet (3 mg total) by mouth at bedtime., Disp: 30 tablet, Rfl: 0   naproxen (NAPROSYN) 500 MG tablet, 1 po bid prn, Disp: , Rfl:   Observations/Objective: Patient is well-developed,  well-nourished in no acute distress.  Resting comfortably  at home.  Head is normocephalic, atraumatic.  No labored breathing.  Speech is clear and coherent with logical content.  Patient is alert and oriented at baseline.    Assessment and Plan: 1. Acute bacterial sinusitis Prescribed Augmentin.  Recommended patient use some saline nasal irrigation.  She reports she has tried this in the past but not recently and will resume doing it.  Instructed patient to continue taking Mucinex and drink lots of liquids.   Follow Up Instructions: I discussed the assessment and treatment plan with the patient. The patient was provided an opportunity to ask questions and all were answered. The patient agreed with the plan and demonstrated an understanding of the instructions.  A copy of instructions were sent to the patient via MyChart unless otherwise noted below.    The patient was advised to call back or seek an in-person evaluation if  the symptoms worsen or if the condition fails to improve as anticipated.  Time:  I spent 10 minutes with the patient via telehealth technology discussing the above problems/concerns.    Carvel Getting, NP

## 2021-01-01 ENCOUNTER — Telehealth: Payer: 59 | Admitting: Physician Assistant

## 2021-01-01 ENCOUNTER — Other Ambulatory Visit: Payer: Self-pay

## 2021-01-01 ENCOUNTER — Ambulatory Visit: Payer: Medicaid Other | Admitting: Licensed Clinical Social Worker

## 2021-01-01 DIAGNOSIS — A084 Viral intestinal infection, unspecified: Secondary | ICD-10-CM | POA: Diagnosis not present

## 2021-01-01 MED ORDER — ONDANSETRON HCL 4 MG PO TABS: 4.0000 mg | ORAL_TABLET | Freq: Three times a day (TID) | ORAL | 0 refills | Status: DC | PRN

## 2021-01-01 NOTE — Progress Notes (Signed)
Virtual Visit Consent   Walker Walker, you are scheduled for a virtual visit with a [facility] provider today.     Just as with appointments in the office, your consent must be obtained to participate.  Your consent will be active for this visit and any virtual visit you may have with one of our providers in the next 365 days.     If you have a MyChart account, a copy of this consent can be sent to you electronically.  All virtual visits are billed to your insurance company just like a traditional visit in the office.    As this is a virtual visit, video technology does not allow for your provider to perform a traditional examination.  This may limit your provider's ability to fully assess your condition.  If your provider identifies any concerns that need to be evaluated in person or the need to arrange testing (such as labs, EKG, etc.), we will make arrangements to do so.     Although advances in technology are sophisticated, we cannot ensure that it will always work on either your end or our end.  If the connection with a video visit is poor, the visit may have to be switched to a telephone visit.  With either a video or telephone visit, we are not always able to ensure that we have a secure connection.     I need to obtain your verbal consent now.   Are you willing to proceed with your visit today?    [name] has provided verbal consent on 01/01/2021 for a virtual visit (video or telephone).   Mar Daring, PA-C   Date: 01/01/2021 5:39 PM   Virtual Visit via Video Note   I, Mar Daring, connected with  Walker Walker  ([id number], [birth year]) on 01/01/21 at  5:30 PM EST by a video-enabled telemedicine application and verified that I am speaking with the correct person using two identifiers.  Location: Patient: Virtual Visit Location Patient: Home Provider: Virtual Visit Location Provider: Home Office   I discussed the limitations of evaluation and management by  telemedicine and the availability of in person appointments. The patient expressed understanding and agreed to proceed.    History of Present Illness: Walker Walker is a 30 y.o. who identifies as a female who was assigned female at birth, and is being seen today for diarrhea, severe, vomiting, nausea. Trying to drink gatorade, coke, ginger ale, water. Most is vomiting back up. Severe abdominal cramping. Does report that her 32 yr old had it first and then she started with symptoms today and now her 32 yr old has started with diarrhea.  Was seen on 12/22/20 and treated for sinus infection with Augmentin.  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   MDD (major depressive disorder), recurrent, in partial remission ([place]) 10/02/2020   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   Attention and concentration deficit 08/31/2020   Moderate episode of recurrent major depressive disorder ([place]) 06/29/2020   High risk medication use 06/29/2020   History of ADHD 06/29/2020   Uterine scar from previous cesarean delivery affecting pregnancy 12/31/2018   Anti-D antibodies present during pregnancy 07-19-2018   BRCA gene mutation negative 05/04/2018  History of cesarean delivery 02/10/2018   Rh negative state in antepartum period 08/23/2017   History of eclampsia 07/22/2017   Migraines 04/09/2017   GAD (generalized anxiety disorder) 12/01/2014    Allergies:  Allergies  Allergen Reactions   Fish Allergy Shortness Of Breath   Shellfish Allergy Shortness Of Breath   Medications:  Current Outpatient Medications:    ondansetron (ZOFRAN) 4 MG tablet, Take 1 tablet  (4 mg total) by mouth every 8 (eight) hours as needed for nausea or vomiting., Disp: 20 tablet, Rfl: 0   amoxicillin-clavulanate (AUGMENTIN) 875-125 MG tablet, Take 1 tablet by mouth 2 (two) times daily., Disp: 14 tablet, Rfl: 0   amphetamine-dextroamphetamine (ADDERALL) 20 MG tablet, Take 0.5 tablets (10 mg total) by mouth 2 (two) times daily. Take half tablet daily at 7 AM and half tablet daily at 1 PM, Disp: 30 tablet, Rfl: 0   ARIPiprazole (ABILIFY) 5 MG tablet, Take 0.5 tablets (2.5 mg total) by mouth daily. Dose change, Disp: 30 tablet, Rfl: 1   benztropine (COGENTIN) 0.5 MG tablet, TAKE 1 TABLET (0.5 MG TOTAL) BY MOUTH DAILY AS NEEDED FOR TREMORS., Disp: 30 tablet, Rfl: 1   Butalbital-APAP-Caffeine 50-325-40 MG capsule, Take by mouth., Disp: , Rfl:    citalopram (CELEXA) 10 MG tablet, TAKE 1 TABLET BY MOUTH EVERY DAY, Disp: 90 tablet, Rfl: 0   clotrimazole-betamethasone (LOTRISONE) cream, Apply topically 2 (two) times daily., Disp: , Rfl:    cyclobenzaprine (FLEXERIL) 10 MG tablet, Take 1 tablet by mouth as needed., Disp: , Rfl:    hydrOXYzine (ATARAX/VISTARIL) 25 MG tablet, TAKE 1-2 TABLETS (25-50 MG TOTAL) BY MOUTH DAILY AS NEEDED FOR ANXIETY., Disp: 180 tablet, Rfl: 0   lamoTRIgine (LAMICTAL) 200 MG tablet, Take 1 tablet (200 mg total) by mouth daily., Disp: 90 tablet, Rfl: 0   lamoTRIgine (LAMICTAL) 25 MG tablet, TAKE 2 TABLETS (50 MG TOTAL) BY MOUTH DAILY. TAKE ALONG WITH 200 MG DAILY - TOTAL OF 250 MG DAILY, Disp: 180 tablet, Rfl: 0   levonorgestrel (MIRENA) 20 MCG/24HR IUD, by Intrauterine route., Disp: , Rfl:    melatonin 3 MG TABS tablet, Take 1 tablet (3 mg total) by mouth at bedtime., Disp: 30 tablet, Rfl: 0   naproxen (NAPROSYN) 500 MG tablet, 1 po bid prn, Disp: , Rfl:   Observations/Objective: Patient is well-developed, well-nourished in no acute distress.  Appears ill Resting comfortably at home.  Head is normocephalic, atraumatic.  No labored breathing.  Speech is clear  and coherent with logical content.  Patient is alert and oriented at baseline.    Assessment and Plan: 1. Viral gastroenteritis - ondansetron (ZOFRAN) 4 MG tablet; Take 1 tablet (4 mg total) by mouth every 8 (eight) hours as needed for nausea or vomiting.  Dispense: 20 tablet; Refill: 0  - Suspect Viral GI, possible norovirus due to severity of symptoms - Did discuss possibility of C.diff with recent antibiotic, but since 30 yr old had symptoms first leaning more toward viral infection - Zofran for nausea, Imodium for diarrhea - Bland foods (BRAT diet) increase diet as tolerated - Push fluids - Rest - Seek in person evaluation if symptoms worsen or fail to improve   Follow Up Instructions: I discussed the assessment and treatment plan with the patient. The patient was provided an opportunity to ask questions and all were answered. The patient agreed with the plan and demonstrated an understanding of the instructions.  A copy of instructions were sent to the patient via MyChart unless otherwise  noted below.    The patient was advised to call back or seek an in-person evaluation if the symptoms worsen or if the condition fails to improve as anticipated.  Time:  I spent 12 minutes with the patient via telehealth technology discussing the above problems/concerns.    Mar Daring, PA-C

## 2021-01-01 NOTE — Progress Notes (Signed)
Connected virtually with Denise Walker--pt states that she has a really bad stomach virus and would like to reschedule her appt.  Pt states mood has improved and that she is not having any immediate mental health concerns or crises.

## 2021-01-01 NOTE — Patient Instructions (Signed)
Denise Walker, thank you for joining Mar Daring, PA-C for today's virtual visit.  While this provider is not your primary care provider (PCP), if your PCP is located in our provider database this encounter information will be shared with them immediately following your visit.  Consent: (Patient) Denise Walker provided verbal consent for this virtual visit at the beginning of the encounter.  Current Medications:  Current Outpatient Medications:    ondansetron (ZOFRAN) 4 MG tablet, Take 1 tablet (4 mg total) by mouth every 8 (eight) hours as needed for nausea or vomiting., Disp: 20 tablet, Rfl: 0   amoxicillin-clavulanate (AUGMENTIN) 875-125 MG tablet, Take 1 tablet by mouth 2 (two) times daily., Disp: 14 tablet, Rfl: 0   amphetamine-dextroamphetamine (ADDERALL) 20 MG tablet, Take 0.5 tablets (10 mg total) by mouth 2 (two) times daily. Take half tablet daily at 7 AM and half tablet daily at 1 PM, Disp: 30 tablet, Rfl: 0   ARIPiprazole (ABILIFY) 5 MG tablet, Take 0.5 tablets (2.5 mg total) by mouth daily. Dose change, Disp: 30 tablet, Rfl: 1   benztropine (COGENTIN) 0.5 MG tablet, TAKE 1 TABLET (0.5 MG TOTAL) BY MOUTH DAILY AS NEEDED FOR TREMORS., Disp: 30 tablet, Rfl: 1   Butalbital-APAP-Caffeine 50-325-40 MG capsule, Take by mouth., Disp: , Rfl:    citalopram (CELEXA) 10 MG tablet, TAKE 1 TABLET BY MOUTH EVERY DAY, Disp: 90 tablet, Rfl: 0   clotrimazole-betamethasone (LOTRISONE) cream, Apply topically 2 (two) times daily., Disp: , Rfl:    cyclobenzaprine (FLEXERIL) 10 MG tablet, Take 1 tablet by mouth as needed., Disp: , Rfl:    hydrOXYzine (ATARAX/VISTARIL) 25 MG tablet, TAKE 1-2 TABLETS (25-50 MG TOTAL) BY MOUTH DAILY AS NEEDED FOR ANXIETY., Disp: 180 tablet, Rfl: 0   lamoTRIgine (LAMICTAL) 200 MG tablet, Take 1 tablet (200 mg total) by mouth daily., Disp: 90 tablet, Rfl: 0   lamoTRIgine (LAMICTAL) 25 MG tablet, TAKE 2 TABLETS (50 MG TOTAL) BY MOUTH DAILY. TAKE ALONG WITH 200 MG DAILY  - TOTAL OF 250 MG DAILY, Disp: 180 tablet, Rfl: 0   levonorgestrel (MIRENA) 20 MCG/24HR IUD, by Intrauterine route., Disp: , Rfl:    melatonin 3 MG TABS tablet, Take 1 tablet (3 mg total) by mouth at bedtime., Disp: 30 tablet, Rfl: 0   naproxen (NAPROSYN) 500 MG tablet, 1 po bid prn, Disp: , Rfl:    Medications ordered in this encounter:  Meds ordered this encounter  Medications   ondansetron (ZOFRAN) 4 MG tablet    Sig: Take 1 tablet (4 mg total) by mouth every 8 (eight) hours as needed for nausea or vomiting.    Dispense:  20 tablet    Refill:  0    Order Specific Question:   Supervising Provider    Answer:   Sabra Heck, BRIAN [[id number]]     *If you need refills on other medications prior to your next appointment, please contact your pharmacy*  Follow-Up: Call back or seek an in-person evaluation if the symptoms worsen or if the condition fails to improve as anticipated.  Other Instructions Viral Gastroenteritis, Adult Viral gastroenteritis is also known as the stomach flu. This condition may affect your stomach, your small intestine, and your large intestine. It can cause sudden watery poop (diarrhea), fever, and throwing up (vomiting). This condition is caused by certain germs (viruses). These germs can be passed from person to person very easily (are contagious). Having watery poop and throwing up can make you feel weak and cause you to not have  enough water in your body (get dehydrated). This can make you tired and thirsty, make you have a dry mouth, and make it so you pee (urinate) less often. It is important to replace the fluids that you lose from having watery poop and throwing up. What are the causes? You can get sick by catching viruses from other people. You can also get sick by: Eating food, drinking water, or touching a surface that has the viruses on it (is contaminated). Sharing utensils or other personal items with a person who is sick. What increases the risk? Having a weak  body defense system (immune system). Living with one or more children who are younger than 12 years old. Living in a nursing home. Going on cruise ships. What are the signs or symptoms? Symptoms of this condition start suddenly. Symptoms may last for a few days or for as long as a week. Common symptoms include: Watery poop. Throwing up. Other symptoms include: Fever. Headache. Feeling tired (fatigue). Pain in the belly (abdomen). Chills. Feeling weak. Feeling sick to your stomach (nauseous). Muscle aches. Not feeling hungry. How is this treated? This condition typically goes away on its own. The focus of treatment is to replace the fluids that you lose. This condition may be treated with: An ORS (oral rehydration solution). This is a drink that is sold at pharmacies and stores. Medicines to help with your symptoms. Probiotic supplements to reduce symptoms of diarrhea. Fluids given through an IV tube, if needed. Older adults and people with other diseases or a weak body defense system are at higher risk for not having enough water in the body. Follow these instructions at home: Eating and drinking  Take an ORS as told by your doctor. Drink clear fluids in small amounts as you are able. Clear fluids include: Water. Ice chips. Fruit juice with water added to it (diluted). Low-calorie sports drinks. Drink enough fluid to keep your pee (urine) pale yellow. Eat small amounts of healthy foods every 3-4 hours as you are able. This may include whole grains, fruits, vegetables, lean meats, and yogurt. Avoid fluids that have a lot of sugar or caffeine in them, such as energy drinks, sports drinks, and soda. Avoid spicy or fatty foods. Avoid alcohol. General instructions  Wash your hands often. This is very important after you have watery poop or you throw up. If you cannot use soap and water, use hand sanitizer. Make sure that all people in your home wash their hands well and  often. Take over-the-counter and prescription medicines only as told by your doctor. Rest at home while you get better. Watch your condition for any changes. Take a warm bath to help with any burning or pain from having watery poop. Keep all follow-up visits as told by your doctor. This is important. Contact a doctor if: You cannot keep fluids down. Your symptoms get worse. You have new symptoms. You feel light-headed. You feel dizzy. You have muscle cramps. Get help right away if: You have chest pain. You feel very weak. You pass out (faint). You see blood in your throw-up. Your throw-up looks like coffee grounds. You have bloody or black poop (stools) or poop that looks like tar. You have a very bad headache, or a stiff neck, or both. You have a rash. You have very bad pain, cramping, or bloating in your belly. You have trouble breathing. You are breathing very quickly. You have a fast heartbeat. Your skin feels cold and clammy. You feel  mixed up (confused). You have pain when you pee. You have signs of not having enough water in the body, such as: Dark pee, hardly any pee, or no pee. Cracked lips. Dry mouth. Sunken eyes. Feeling very sleepy. Feeling weak. Summary Viral gastroenteritis is also known as the stomach flu. This condition can cause sudden watery poop (diarrhea), fever, and throwing up (vomiting). These germs can be passed from person to person very easily. Take an ORS as told by your doctor. This is a drink that is sold at pharmacies and stores. Drink fluids in small amounts many times each day as you are able. This information is not intended to replace advice given to you by your health care provider. Make sure you discuss any questions you have with your health care provider. Document Revised: 12/03/2017 Document Reviewed: 12/03/2017 Elsevier Patient Education  2022 [place] Diet A bland diet consists of foods that are often soft and do not  have a lot of fat, fiber, or extra seasonings. Foods without fat, fiber, or seasoning are easier for the body to digest. They are also less likely to irritate your mouth, throat, stomach, and other parts of your digestive system. A bland diet is sometimes called a BRAT diet. What is my plan? Your health care provider or food and nutrition specialist (dietitian) may recommend specific changes to your diet to prevent symptoms or to treat your symptoms. These changes may include: Eating small meals often. Cooking food until it is soft enough to chew easily. Chewing your food well. Drinking fluids slowly. Not eating foods that are very spicy, sour, or fatty. Not eating citrus fruits, such as oranges and grapefruit. What do I need to know about this diet? Eat a variety of foods from the bland diet food list. Do not follow a bland diet longer than needed. Ask your health care provider whether you should take vitamins or supplements. What foods can I eat? Grains Hot cereals, such as cream of wheat. Rice. Bread, crackers, or tortillas made from refined white flour. Vegetables Canned or cooked vegetables. Mashed or boiled potatoes. Fruits Bananas. Applesauce. Other types of cooked or canned fruit with the skin and seeds removed, such as canned peaches or pears. Meats and other proteins Scrambled eggs. Creamy peanut butter or other nut butters. Lean, well-cooked meats, such as chicken or fish. Tofu. Soups or broths. Dairy Low-fat dairy products, such as milk, cottage cheese, or yogurt. Beverages Water. Herbal tea. Apple juice. Fats and oils Mild salad dressings. Canola or olive oil. Sweets and desserts Pudding. Custard. Fruit gelatin. Ice cream. The items listed above may not be a complete list of recommended foods and beverages. Contact a dietitian for more options. What foods are not recommended? Grains Whole grain breads and cereals. Vegetables Raw vegetables. Fruits Raw fruits,  especially citrus, berries, or dried fruits. Dairy Whole fat dairy foods. Beverages Caffeinated drinks. Alcohol. Seasonings and condiments Strongly flavored seasonings or condiments. Hot sauce. Salsa. Other foods Spicy foods. Fried foods. Sour foods, such as pickled or fermented foods. Foods with high sugar content. Foods high in fiber. The items listed above may not be a complete list of foods and beverages to avoid. Contact a dietitian for more information. Summary A bland diet consists of foods that are often soft and do not have a lot of fat, fiber, or extra seasonings. Foods without fat, fiber, or seasoning are easier for the body to digest. Check with your health care provider to see how long  you should follow this diet plan. It is not meant to be followed for long periods. This information is not intended to replace advice given to you by your health care provider. Make sure you discuss any questions you have with your health care provider. Document Revised: 02/26/2017 Document Reviewed: 02/26/2017 Elsevier Patient Education  2022 Elsevier [employer]  Clostridioides Difficile Infection Clostridioides difficile infection, or C. diff, is an infection that is caused by C. diff germs (bacteria). This infection may happen after you take antibiotics that kill other germs and let C. diff germs grow. C. diff can be spread from person to person (is contagious). What are the causes? Taking certain antibiotics. Coming in contact with people, food, or things that have C. diff. What increases the risk? Taking certain antibiotics for a long time. Staying in a hospital or long-term care facility for a long time. Being age 27 or older. Having had C. diff before or been exposed to C. diff. Having a weak disease-fighting system (immune system). Taking medicines that treat stomach acid. Having serious health problems, including: Colon cancer. Inflammatory bowel disease (IBD). Having had a procedure  or surgery on your digestive system. What are the signs or symptoms? Watery poop (diarrhea). Fever. Not feeling hungry. Feeling like you may vomit. Swelling, pain, cramps, or a tender belly. How is this treated? Treatment may include: Stopping the antibiotics that caused the C. diff infection. Taking antibiotics that kill C. diff. Placing poop from a healthy person into your colon (fecal transplant). Doing surgery to take out the infected part of the colon. Follow these instructions at home: Medicines Take over-the-counter and prescription medicines only as told by your doctor. Take antibiotic medicine as told by your doctor. Do not stop taking it even if you start to feel better. Do not take medicines to treat watery poop unless your doctor tells you to. Eating and drinking  Follow instructions from your doctor about what to eat and drink. This may include eating bland foods in small amounts, such as: Bananas. Applesauce. Rice. Lean meats. Toast. Crackers. To prevent loss of fluid in your body (dehydration): Take in enough fluids to keep your pee pale yellow. This includes water, ice chips, clear fruit juice with water added to it, or low-calorie sports drinks. Take an ORS (oral rehydration solution). This drink is sold in pharmacies and retail stores. Avoid milk, caffeine, and alcohol. General instructions Wash your hands often with soap and water. Do this for at least 20 seconds. Take a bath or shower every day. Return to your normal activities when your doctor says that it is safe. Keep all follow-up visits. How is this prevented? Personal hygiene  Wash your hands often with soap and water. Do this for at least 20 seconds. Wash your hands before you cook and after you use the bathroom. Other people should wash their hands too, especially: People who live with you. People who visit you in a hospital or clinic. Contact precautions If you get watery poop while you are in  the hospital or a long-term care facility, tell your doctor right away. When you visit someone in the hospital or a long-term care facility, wear a gown, gloves, or other protection. If possible: Stay away from people who have diarrhea. Use a separate bathroom if you are sick and live with other people. Clean environment Keep your home clean. Clean your home every day for at least a week after you leave the hospital. Clean surfaces that you touch every day.  Use a product that has a 10% chlorine bleach solution. Be sure to: Read the label on your product to make sure that the product will kill the germs on your surfaces. Clean toilets and flush handles, bathtubs, sinks, doorknobs and handles, countertops, and work surfaces. If you are in the hospital, make sure the surfaces in your room are cleaned each day. Tell someone right away if body fluids have splashed or spilled. Clothes and linens Wash clothes and linens using laundry soap that has chlorine bleach. Be sure to: Use powder soap instead of liquid. Clean your washing machine once a month. To do this, turn on the hot setting with only soap in it. Contact a doctor if: Your symptoms do not get better or they get worse. Your symptoms go away and then come back. You have a fever. You have new symptoms. Get help right away if: Your belly is more tender or you have more pain. Your poop is mostly bloody. Your poop looks black. You vomit after you eat or drink. You have signs of not having enough fluids in your body. These include: Dark yellow pee, very little pee, or no pee. Cracked lips or dry mouth. No tears when you cry. Sunken eyes. Feeling sleepy. Feeling weak or dizzy. Summary C. diff infection is an infection that may happen after you take antibiotic medicines. Symptoms include watery poop, fever, not feeling hungry, or feeling like you may vomit. Treatment includes stopping the antibiotics that made you sick and taking  antibiotics that kill the C. diff germs. Poop from a healthy person may also be placed into your colon. To prevent C. diff infectionfrom spreading, wash hands often with soap and water. Do this for at least 20 seconds. Keep your home clean. This information is not intended to replace advice given to you by your health care provider. Make sure you discuss any questions you have with your health care provider. Document Revised: 05/20/2019 Document Reviewed: 05/20/2019 Elsevier Patient Education  2022 [employer].      If you have been instructed to have an in-person evaluation today at a local Urgent Care facility, please use the link below. It will take you to a list of all of our available [facility], including address, phone number and hours of operation. Please do not delay care.  [facility]  If you or a family member do not have a primary care provider, use the link below to schedule a visit and establish care. When you choose a [facility] primary care physician or advanced practice provider, you gain a long-term partner in health. Find a Primary Care Provider  Learn more about [facility] in-office and virtual care options: [facility] - Get Care Now

## 2021-01-03 ENCOUNTER — Ambulatory Visit (INDEPENDENT_AMBULATORY_CARE_PROVIDER_SITE_OTHER): Payer: 59 | Admitting: Licensed Clinical Social Worker

## 2021-01-03 ENCOUNTER — Other Ambulatory Visit: Payer: Self-pay

## 2021-01-03 DIAGNOSIS — F411 Generalized anxiety disorder: Secondary | ICD-10-CM

## 2021-01-03 DIAGNOSIS — F3342 Major depressive disorder, recurrent, in full remission: Secondary | ICD-10-CM

## 2021-01-03 NOTE — Plan of Care (Signed)
  Problem: Depression CCP Problem  1 Decrease depressive symptoms and improve levels of effective functioning Goal: LTG: Reduce frequency, intensity, and duration of depression symptoms as evidenced by: SSB input needed on appropriate metric Outcome: Progressing Note: Pt reporting fewer depression symptoms Goal: STG: @PREFFIRSTNAME @ will participate in at least 80% of scheduled individual psychotherapy sessions Outcome: Progressing Note: Pt on time and engaged throughout appointment Intervention: Review PLEASE Skills (Treat Physical Illness, Balance Eating, Avoid Mood-Altering Substances, Balance Sleep and Get Exercise) with @PREFFIRSTNAME @ Intervention: WORK WITH TO IDENTIFY THE MAJOR COMPONENTS OF A RECENT EPISODE OF DEPRESSION: PHYSICAL SYMPTOMS, MAJOR THOUGHTS AND IMAGES, AND MAJOR BEHAVIORS THEY EXPERIENCED Intervention: Encourage patient to identify triggers Intervention: Assist with coping skills and behavior

## 2021-01-03 NOTE — Progress Notes (Addendum)
Virtual Visit via Video Note  I connected with Denise Walker on 01/03/21 at  8:00 AM EST by a video enabled telemedicine application and verified that I am speaking with the correct person using two identifiers.  Location: Patient: home Provider: [facility]   I discussed the limitations of evaluation and management by telemedicine and the availability of in person appointments. The patient expressed understanding and agreed to proceed.   I discussed the assessment and treatment plan with the patient. The patient was provided an opportunity to ask questions and all were answered. The patient agreed with the plan and demonstrated an understanding of the instructions.   The patient was advised to call back or seek an in-person evaluation if the symptoms worsen or if the condition fails to improve as anticipated.  I provided 20 minutes of non-face-to-face time during this encounter.   Chiron Campione R Jozeph Persing, LCSW   THERAPIST PROGRESS NOTE  Session Time: 8-820a  Participation Level: Active  Behavioral Response: Neat and Well GroomedAlertEuthymic  Type of Therapy: Individual Therapy  Treatment Goals addressed:  Problem: Depression CCP Problem  1 Decrease depressive symptoms and improve levels of effective functioning Goal: LTG: Reduce frequency, intensity, and duration of depression symptoms as evidenced by: SSB input needed on appropriate metric Outcome: Progressing Note: Pt reporting fewer depression symptoms  Goal: STG: @PREFFIRSTNAME @ will participate in at least 80% of scheduled individual psychotherapy sessions Outcome: Progressing Note: Pt on time and engaged throughout appointment Interventions:  Intervention: Review PLEASE Skills (Treat Physical Illness, Balance Eating, Avoid Mood-Altering Substances, Balance Sleep and Get Exercise) with @PREFFIRSTNAME @  Intervention: WORK WITH TO IDENTIFY THE MAJOR COMPONENTS OF A RECENT EPISODE OF DEPRESSION: PHYSICAL SYMPTOMS, MAJOR  THOUGHTS AND IMAGES, AND MAJOR BEHAVIORS THEY EXPERIENCED  Intervention: Encourage patient to identify triggers  Intervention: Assist with coping skills and behavior  Summary: Denise Walker is a 30 y.o. female who presents with improving symptoms related to depression diagnosis. Pt reporting that the overall intensity of depression and anxiety symptoms have decreased since last session. Pt reports that overall mood is stable and that she is managing stress and anxiety symptoms well. Patient reports improving quality and quantity of sleep. Allowed patient safe space to explore and express thoughts and feelings associated with recent external stressors and life events. Patient reports that she is compliant with medication, and feels that it is working well to manage her symptoms.  Pt reports no side effects of medication.   Patient reports that overall relationships with family members are going well. Patient reports that she is trying to manage overall self-care and life balance on a daily basis. Pt reports that she has had a stomach virus recently (and all the children), so being primary caregiver to her sick children while she is also sick has been difficult for pt. Pt is unsure whether family will engage in holiday activities because of illnesses, which is triggering stress for pt. Reviewed coping skills for managing depression and anxiety symptoms.   Continued recommendations are as follows: self care behaviors, positive social engagements, focusing on overall work/home/life balance, and focusing on positive physical and emotional wellness.  .   Suicidal/Homicidal: No  Therapist Response: Pt is continuing to apply interventions learned in session into daily life situations. Pt is currently on track to meet goals utilizing interventions mentioned above. Personal growth and progress noted. Treatment to continue as indicated.   Plan: Return again in 4 weeks.  Diagnosis: Axis I: MDD, recurrent,  remission; GAD    Axis  II: No diagnosis    Ernest Haber Subhan Hoopes, LCSW 01/03/2021

## 2021-01-06 ENCOUNTER — Encounter: Payer: 59 | Admitting: Nurse Practitioner

## 2021-01-06 DIAGNOSIS — K30 Functional dyspepsia: Secondary | ICD-10-CM

## 2021-01-06 NOTE — Patient Instructions (Signed)
Based on what you shared with me, I feel your condition warrants further evaluation and I recommend that you be seen for a face to face visit.  Please contact your primary care physician practice to be seen. Many offices offer virtual options to be seen via video if you are not comfortable going in person to a medical facility at this time.  NOTE: You will NOT be charged for this eVisit.  If you do not have a PCP, [facility] offers a free physician referral service available at [phone]. Our trained staff has the experience, knowledge and resources to put you in touch with a physician who is right for you.    If you are having a true medical emergency please call 911.

## 2021-01-06 NOTE — Progress Notes (Signed)
Virtual Visit Consent   Denise Walker, you are scheduled for a virtual visit with a [facility] provider today.     Just as with appointments in the office, your consent must be obtained to participate.  Your consent will be active for this visit and any virtual visit you may have with one of our providers in the next 365 days.     If you have a MyChart account, a copy of this consent can be sent to you electronically.  All virtual visits are billed to your insurance company just like a traditional visit in the office.    As this is a virtual visit, video technology does not allow for your provider to perform a traditional examination.  This may limit your provider's ability to fully assess your condition.  If your provider identifies any concerns that need to be evaluated in person or the need to arrange testing (such as labs, EKG, etc.), we will make arrangements to do so.     Although advances in technology are sophisticated, we cannot ensure that it will always work on either your end or our end.  If the connection with a video visit is poor, the visit may have to be switched to a telephone visit.  With either a video or telephone visit, we are not always able to ensure that we have a secure connection.     I need to obtain your verbal consent now.   Are you willing to proceed with your visit today?    Denise Walker has provided verbal consent on 01/06/2021 for a virtual visit (video or telephone).   Gildardo Pounds, NP   Date: 01/06/2021 5:08 PM   Virtual Visit via Video Note   I, Gildardo Pounds, connected with  Denise Walker  ([id number], 11-Apr-1990) on 01/06/21 at  5:00 PM EST by a video-enabled telemedicine application and verified that I am speaking with the correct person using two identifiers.  Location: Patient: Virtual Visit Location Patient: Home Provider: Virtual Visit Location Provider: Home Office   I discussed the limitations of evaluation and management by  telemedicine and the availability of in person appointments. The patient expressed understanding and agreed to proceed.    History of Present Illness: Denise Walker is a 30 y.o. who identifies as a female who was assigned female at birth, and is being seen today for delayed gastric emptying.  HPI: States she was diagnosed with gastroenteritis last week and treated with zofran. Currently her symptoms of gastroenteritis have improved however now she notes stomach discomfort and early satiety after only eating a small amount of food.   Problems:  Patient Active Problem List   Diagnosis Date Noted   ADHD (attention deficit hyperactivity disorder), inattentive type 12/21/2020   Chronic post-traumatic stress disorder (PTSD) 12/21/2020   MDD (major depressive disorder), recurrent, in full remission ([place]) 11/15/2020   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   MDD (major depressive disorder), recurrent, in partial remission ([place]) 10/02/2020   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   Attention and concentration deficit 08/31/2020   Moderate episode of recurrent major depressive disorder ([place]) 06/29/2020   High risk medication use 06/29/2020   History of ADHD 06/29/2020   Uterine scar from previous cesarean delivery affecting pregnancy 12/31/2018   Anti-D antibodies present during pregnancy 07/17/2018   BRCA gene mutation negative 05/04/2018   History of cesarean delivery 02/10/2018   Rh negative state in antepartum period 08/23/2017  History of eclampsia 07/22/2017   Migraines 04/09/2017   GAD (generalized anxiety disorder) 12/01/2014    Allergies:  Allergies  Allergen Reactions   Fish Allergy Shortness Of Breath   Shellfish Allergy Shortness Of Breath   Medications:  Current Outpatient Medications:    amoxicillin-clavulanate (AUGMENTIN) 875-125 MG tablet, Take 1 tablet by mouth 2 (two) times daily., Disp: 14 tablet,  Rfl: 0   amphetamine-dextroamphetamine (ADDERALL) 20 MG tablet, Take 0.5 tablets (10 mg total) by mouth 2 (two) times daily. Take half tablet daily at 7 AM and half tablet daily at 1 PM, Disp: 30 tablet, Rfl: 0   ARIPiprazole (ABILIFY) 5 MG tablet, Take 0.5 tablets (2.5 mg total) by mouth daily. Dose change, Disp: 30 tablet, Rfl: 1   benztropine (COGENTIN) 0.5 MG tablet, TAKE 1 TABLET (0.5 MG TOTAL) BY MOUTH DAILY AS NEEDED FOR TREMORS., Disp: 30 tablet, Rfl: 1   Butalbital-APAP-Caffeine 50-325-40 MG capsule, Take by mouth., Disp: , Rfl:    citalopram (CELEXA) 10 MG tablet, TAKE 1 TABLET BY MOUTH EVERY DAY, Disp: 90 tablet, Rfl: 0   clotrimazole-betamethasone (LOTRISONE) cream, Apply topically 2 (two) times daily., Disp: , Rfl:    cyclobenzaprine (FLEXERIL) 10 MG tablet, Take 1 tablet by mouth as needed., Disp: , Rfl:    hydrOXYzine (ATARAX/VISTARIL) 25 MG tablet, TAKE 1-2 TABLETS (25-50 MG TOTAL) BY MOUTH DAILY AS NEEDED FOR ANXIETY., Disp: 180 tablet, Rfl: 0   lamoTRIgine (LAMICTAL) 200 MG tablet, Take 1 tablet (200 mg total) by mouth daily., Disp: 90 tablet, Rfl: 0   lamoTRIgine (LAMICTAL) 25 MG tablet, TAKE 2 TABLETS (50 MG TOTAL) BY MOUTH DAILY. TAKE ALONG WITH 200 MG DAILY - TOTAL OF 250 MG DAILY, Disp: 180 tablet, Rfl: 0   levonorgestrel (MIRENA) 20 MCG/24HR IUD, by Intrauterine route., Disp: , Rfl:    melatonin 3 MG TABS tablet, Take 1 tablet (3 mg total) by mouth at bedtime., Disp: 30 tablet, Rfl: 0   naproxen (NAPROSYN) 500 MG tablet, 1 po bid prn, Disp: , Rfl:    ondansetron (ZOFRAN) 4 MG tablet, Take 1 tablet (4 mg total) by mouth every 8 (eight) hours as needed for nausea or vomiting., Disp: 20 tablet, Rfl: 0  Observations/Objective: Patient is well-developed, well-nourished in no acute distress.  Resting comfortably  at home.  Head is normocephalic, atraumatic.  No labored breathing.  Speech is clear and coherent with logical content.  Patient is alert and oriented at baseline.     Assessment and Plan: 1. Delayed gastric emptying NEEDS FACE TO FACE VISIT  Follow Up Instructions: I discussed the assessment and treatment plan with the patient. The patient was provided an opportunity to ask questions and all were answered. The patient agreed with the plan and demonstrated an understanding of the instructions.  A copy of instructions were sent to the patient via MyChart unless otherwise noted below.     The patient was advised to call back or seek an in-person evaluation if the symptoms worsen or if the condition fails to improve as anticipated.  Time:  I spent 10 minutes with the patient via telehealth technology discussing the above problems/concerns.    Gildardo Pounds, NP

## 2021-01-13 ENCOUNTER — Telehealth: Payer: 59 | Admitting: Emergency Medicine

## 2021-01-13 DIAGNOSIS — R6889 Other general symptoms and signs: Secondary | ICD-10-CM

## 2021-01-13 DIAGNOSIS — Z20828 Contact with and (suspected) exposure to other viral communicable diseases: Secondary | ICD-10-CM

## 2021-01-13 MED ORDER — OSELTAMIVIR PHOSPHATE 75 MG PO CAPS: 75.0000 mg | ORAL_CAPSULE | Freq: Two times a day (BID) | ORAL | 0 refills | Status: AC

## 2021-01-13 MED ORDER — BENZONATATE 100 MG PO CAPS: 100.0000 mg | ORAL_CAPSULE | Freq: Two times a day (BID) | ORAL | 0 refills | Status: DC | PRN

## 2021-01-13 NOTE — Progress Notes (Signed)
Virtual Visit Consent   Denise Walker, you are scheduled for a virtual visit with a [facility] provider today.     Just as with appointments in the office, your consent must be obtained to participate.  Your consent will be active for this visit and any virtual visit you may have with one of our providers in the next 365 days.     If you have a MyChart account, a copy of this consent can be sent to you electronically.  All virtual visits are billed to your insurance company just like a traditional visit in the office.    As this is a virtual visit, video technology does not allow for your provider to perform a traditional examination.  This may limit your provider's ability to fully assess your condition.  If your provider identifies any concerns that need to be evaluated in person or the need to arrange testing (such as labs, EKG, etc.), we will make arrangements to do so.     Although advances in technology are sophisticated, we cannot ensure that it will always work on either your end or our end.  If the connection with a video visit is poor, the visit may have to be switched to a telephone visit.  With either a video or telephone visit, we are not always able to ensure that we have a secure connection.     I need to obtain your verbal consent now.   Are you willing to proceed with your visit today? Yes   Denise Walker has provided verbal consent on 01/13/2021 for a virtual visit (video or telephone).   Lestine Box, [place]   Date: 01/13/2021 11:35 AM   Virtual Visit via Video Note   I, Lestine Box, connected with  Denise Walker  ([id number], November 15, 1990) on 01/13/21 at 11:30 AM EST by a video-enabled telemedicine application and verified that I am speaking with the correct person using two identifiers.  Location: Patient: Virtual Visit Location Patient: Home Provider: Virtual Visit Location Provider: Home Office   I discussed the limitations of evaluation and management by  telemedicine and the availability of in person appointments. The patient expressed understanding and agreed to proceed.    History of Present Illness: Denise Walker is a 30 y.o. who identifies as a female who was assigned female at birth, and is being seen today for productive cough, headache, fatigue, body aches x 1 day.  Daughter with flu.  Has tried OTC medications with minimal relief.  Denies aggravating factors.  Denies previous symptoms in the past.  Reports fever of 101.  Denies rhinorrhea, congestion, chest pain, shortness of breath, nausea, vomiting, diarrhea.    HPI: HPI  Problems:  Patient Active Problem List   Diagnosis Date Noted   ADHD (attention deficit hyperactivity disorder), inattentive type 12/21/2020   Chronic post-traumatic stress disorder (PTSD) 12/21/2020   MDD (major depressive disorder), recurrent, in full remission ([place]) 11/15/2020   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   MDD (major depressive disorder), recurrent, in partial remission ([place]) 10/02/2020   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   Attention and concentration deficit 08/31/2020   Moderate episode of recurrent major depressive disorder ([place]) 06/29/2020   High risk medication use 06/29/2020   History of ADHD 06/29/2020   Uterine scar from previous cesarean delivery affecting pregnancy 12/31/2018   Anti-D antibodies present during pregnancy 2018/07/28   BRCA gene mutation negative 05/04/2018   History of cesarean delivery 02/10/2018  Rh negative state in antepartum period 08/23/2017   History of eclampsia 07/22/2017   Migraines 04/09/2017   GAD (generalized anxiety disorder) 12/01/2014    Allergies:  Allergies  Allergen Reactions   Fish Allergy Shortness Of Breath   Shellfish Allergy Shortness Of Breath   Medications:  Current Outpatient Medications:    benzonatate (TESSALON) 100 MG capsule, Take 1 capsule (100 mg total)  by mouth 2 (two) times daily as needed for cough., Disp: 20 capsule, Rfl: 0   oseltamivir (TAMIFLU) 75 MG capsule, Take 1 capsule (75 mg total) by mouth 2 (two) times daily for 5 days., Disp: 10 capsule, Rfl: 0   amoxicillin-clavulanate (AUGMENTIN) 875-125 MG tablet, Take 1 tablet by mouth 2 (two) times daily., Disp: 14 tablet, Rfl: 0   amphetamine-dextroamphetamine (ADDERALL) 20 MG tablet, Take 0.5 tablets (10 mg total) by mouth 2 (two) times daily. Take half tablet daily at 7 AM and half tablet daily at 1 PM, Disp: 30 tablet, Rfl: 0   ARIPiprazole (ABILIFY) 5 MG tablet, Take 0.5 tablets (2.5 mg total) by mouth daily. Dose change, Disp: 30 tablet, Rfl: 1   benztropine (COGENTIN) 0.5 MG tablet, TAKE 1 TABLET (0.5 MG TOTAL) BY MOUTH DAILY AS NEEDED FOR TREMORS., Disp: 30 tablet, Rfl: 1   Butalbital-APAP-Caffeine 50-325-40 MG capsule, Take by mouth., Disp: , Rfl:    citalopram (CELEXA) 10 MG tablet, TAKE 1 TABLET BY MOUTH EVERY DAY, Disp: 90 tablet, Rfl: 0   clotrimazole-betamethasone (LOTRISONE) cream, Apply topically 2 (two) times daily., Disp: , Rfl:    cyclobenzaprine (FLEXERIL) 10 MG tablet, Take 1 tablet by mouth as needed., Disp: , Rfl:    hydrOXYzine (ATARAX/VISTARIL) 25 MG tablet, TAKE 1-2 TABLETS (25-50 MG TOTAL) BY MOUTH DAILY AS NEEDED FOR ANXIETY., Disp: 180 tablet, Rfl: 0   lamoTRIgine (LAMICTAL) 200 MG tablet, Take 1 tablet (200 mg total) by mouth daily., Disp: 90 tablet, Rfl: 0   lamoTRIgine (LAMICTAL) 25 MG tablet, TAKE 2 TABLETS (50 MG TOTAL) BY MOUTH DAILY. TAKE ALONG WITH 200 MG DAILY - TOTAL OF 250 MG DAILY, Disp: 180 tablet, Rfl: 0   levonorgestrel (MIRENA) 20 MCG/24HR IUD, by Intrauterine route., Disp: , Rfl:    melatonin 3 MG TABS tablet, Take 1 tablet (3 mg total) by mouth at bedtime., Disp: 30 tablet, Rfl: 0   naproxen (NAPROSYN) 500 MG tablet, 1 po bid prn, Disp: , Rfl:    ondansetron (ZOFRAN) 4 MG tablet, Take 1 tablet (4 mg total) by mouth every 8 (eight) hours as needed  for nausea or vomiting., Disp: 20 tablet, Rfl: 0  Observations/Objective: Patient is well-developed, well-nourished in no acute distress.  Resting comfortably  at home. Mildly fatigued appearing, nontoxic Head is normocephalic, atraumatic.  No labored breathing. Speaking in full sentences without difficulty.  Tolerating own secretions.   Speech is clear and coherent with logical content.  Patient is alert and oriented at baseline.    Assessment and Plan: 1. Flu-like symptoms  2. Exposure to the flu Get plenty of rest and push fluids.  Drink at least half your body weight in ounces.  You may supplement with OTC Pedialyte or oral rehydration solution Tamiflu prescribed.  Take as directed and to completion Tessalon Perles prescribed for cough You may continue with OTC medications as needed Use OTC tylenol and/or aleve every 4 hours for fever, body aches, and chills Follow up with PCP if symptoms persist Follow up in person at urgent care or Go to the ED if you  have any new or worsening symptoms fever that does not moderate with tylenol, chills, nausea, vomiting, chest pain, worsening cough, shortness of breath, wheezing, abdominal pain, changes in bowel or bladder habits, etc...    Follow Up Instructions: I discussed the assessment and treatment plan with the patient. The patient was provided an opportunity to ask questions and all were answered. The patient agreed with the plan and demonstrated an understanding of the instructions.  A copy of instructions were sent to the patient via MyChart unless otherwise noted below.    The patient was advised to call back or seek an in-person evaluation if the symptoms worsen or if the condition fails to improve as anticipated.  Time:  I spent 10 minutes with the patient via telehealth technology discussing the above problems/concerns.    Lestine Box, PA-C

## 2021-01-13 NOTE — Patient Instructions (Signed)
[name], thank you for joining [name], PA-C for today's virtual visit.  While this provider is not your primary care provider (PCP), if your PCP is located in our provider database this encounter information will be shared with them immediately following your visit.  Consent: (Patient) Denise Walker provided verbal consent for this virtual visit at the beginning of the encounter.  Current Medications:  Current Outpatient Medications:    benzonatate (TESSALON) 100 MG capsule, Take 1 capsule (100 mg total) by mouth 2 (two) times daily as needed for cough., Disp: 20 capsule, Rfl: 0   oseltamivir (TAMIFLU) 75 MG capsule, Take 1 capsule (75 mg total) by mouth 2 (two) times daily for 5 days., Disp: 10 capsule, Rfl: 0   amoxicillin-clavulanate (AUGMENTIN) 875-125 MG tablet, Take 1 tablet by mouth 2 (two) times daily., Disp: 14 tablet, Rfl: 0   amphetamine-dextroamphetamine (ADDERALL) 20 MG tablet, Take 0.5 tablets (10 mg total) by mouth 2 (two) times daily. Take half tablet daily at 7 AM and half tablet daily at 1 PM, Disp: 30 tablet, Rfl: 0   ARIPiprazole (ABILIFY) 5 MG tablet, Take 0.5 tablets (2.5 mg total) by mouth daily. Dose change, Disp: 30 tablet, Rfl: 1   benztropine (COGENTIN) 0.5 MG tablet, TAKE 1 TABLET (0.5 MG TOTAL) BY MOUTH DAILY AS NEEDED FOR TREMORS., Disp: 30 tablet, Rfl: 1   Butalbital-APAP-Caffeine 50-325-40 MG capsule, Take by mouth., Disp: , Rfl:    citalopram (CELEXA) 10 MG tablet, TAKE 1 TABLET BY MOUTH EVERY DAY, Disp: 90 tablet, Rfl: 0   clotrimazole-betamethasone (LOTRISONE) cream, Apply topically 2 (two) times daily., Disp: , Rfl:    cyclobenzaprine (FLEXERIL) 10 MG tablet, Take 1 tablet by mouth as needed., Disp: , Rfl:    hydrOXYzine (ATARAX/VISTARIL) 25 MG tablet, TAKE 1-2 TABLETS (25-50 MG TOTAL) BY MOUTH DAILY AS NEEDED FOR ANXIETY., Disp: 180 tablet, Rfl: 0   lamoTRIgine (LAMICTAL) 200 MG tablet, Take 1 tablet (200 mg total) by mouth daily., Disp: 90  tablet, Rfl: 0   lamoTRIgine (LAMICTAL) 25 MG tablet, TAKE 2 TABLETS (50 MG TOTAL) BY MOUTH DAILY. TAKE ALONG WITH 200 MG DAILY - TOTAL OF 250 MG DAILY, Disp: 180 tablet, Rfl: 0   levonorgestrel (MIRENA) 20 MCG/24HR IUD, by Intrauterine route., Disp: , Rfl:    melatonin 3 MG TABS tablet, Take 1 tablet (3 mg total) by mouth at bedtime., Disp: 30 tablet, Rfl: 0   naproxen (NAPROSYN) 500 MG tablet, 1 po bid prn, Disp: , Rfl:    ondansetron (ZOFRAN) 4 MG tablet, Take 1 tablet (4 mg total) by mouth every 8 (eight) hours as needed for nausea or vomiting., Disp: 20 tablet, Rfl: 0   Medications ordered in this encounter:  Meds ordered this encounter  Medications   oseltamivir (TAMIFLU) 75 MG capsule    Sig: Take 1 capsule (75 mg total) by mouth 2 (two) times daily for 5 days.    Dispense:  10 capsule    Refill:  0    Order Specific Question:   Supervising Provider    Answer:   MILLER, BRIAN [[id number]]   benzonatate (TESSALON) 100 MG capsule    Sig: Take 1 capsule (100 mg total) by mouth 2 (two) times daily as needed for cough.    Dispense:  20 capsule    Refill:  0    Order Specific Question:   Supervising Provider    Answer:   Hyacinth Meeker, BRIAN [[id number]]     *If you need refills on other  medications prior to your next appointment, please contact your pharmacy*  Follow-Up: Call back or seek an in-person evaluation if the symptoms worsen or if the condition fails to improve as anticipated.  Other Instructions Get plenty of rest and push fluids.  Drink at least half your body weight in ounces.  You may supplement with OTC Pedialyte or oral rehydration solution Tamiflu prescribed.  Take as directed and to completion Tessalon Perles prescribed for cough You may continue with OTC medications as needed Use OTC tylenol and/or aleve every 4 hours for fever, body aches, and chills Follow up with PCP if symptoms persist Follow up in person at urgent care or Go to the ED if you have any new or worsening  symptoms fever that does not moderate with tylenol, chills, nausea, vomiting, chest pain, worsening cough, shortness of breath, wheezing, abdominal pain, changes in bowel or bladder habits, etc...     If you have been instructed to have an in-person evaluation today at a local Urgent Care facility, please use the link below. It will take you to a list of all of our available [facility], including address, phone number and hours of operation. Please do not delay care.  [facility]  If you or a family member do not have a primary care provider, use the link below to schedule a visit and establish care. When you choose a [facility] primary care physician or advanced practice provider, you gain a long-term partner in health. Find a Primary Care Provider  Learn more about [facility] in-office and virtual care options: [facility] - Get Care Now

## 2021-01-14 ENCOUNTER — Ambulatory Visit: Payer: 59

## 2021-01-18 ENCOUNTER — Ambulatory Visit: Payer: Medicaid Other | Admitting: Psychiatry

## 2021-01-21 ENCOUNTER — Telehealth: Payer: 59 | Admitting: Adult Health

## 2021-01-21 DIAGNOSIS — Z8709 Personal history of other diseases of the respiratory system: Secondary | ICD-10-CM | POA: Diagnosis not present

## 2021-01-21 DIAGNOSIS — J40 Bronchitis, not specified as acute or chronic: Secondary | ICD-10-CM | POA: Insufficient documentation

## 2021-01-21 DIAGNOSIS — A499 Bacterial infection, unspecified: Secondary | ICD-10-CM | POA: Insufficient documentation

## 2021-01-21 HISTORY — DX: Bronchitis, not specified as acute or chronic: J40

## 2021-01-21 MED ORDER — AZITHROMYCIN 250 MG PO TABS: ORAL_TABLET | ORAL | 0 refills | Status: AC

## 2021-01-21 MED ORDER — ALBUTEROL SULFATE (2.5 MG/3ML) 0.083% IN NEBU: 2.5000 mg | INHALATION_SOLUTION | Freq: Four times a day (QID) | RESPIRATORY_TRACT | 0 refills | Status: DC | PRN

## 2021-01-21 MED ORDER — PREDNISONE 10 MG (21) PO TBPK: ORAL_TABLET | ORAL | 0 refills | Status: DC

## 2021-01-21 NOTE — Progress Notes (Signed)
Virtual Visit Consent   Denise Walker, you are scheduled for a virtual visit with a [facility] provider today.     Just as with appointments in the office, your consent must be obtained to participate.  Your consent will be active for this visit and any virtual visit you may have with one of our providers in the next 365 days.     If you have a MyChart account, a copy of this consent can be sent to you electronically.  All virtual visits are billed to your insurance company just like a traditional visit in the office.    As this is a virtual visit, video technology does not allow for your provider to perform a traditional examination.  This may limit your provider's ability to fully assess your condition.  If your provider identifies any concerns that need to be evaluated in person or the need to arrange testing (such as labs, EKG, etc.), we will make arrangements to do so.     Although advances in technology are sophisticated, we cannot ensure that it will always work on either your end or our end.  If the connection with a video visit is poor, the visit may have to be switched to a telephone visit.  With either a video or telephone visit, we are not always able to ensure that we have a secure connection.     I need to obtain your verbal consent now.   Are you willing to proceed with your visit today?    Denise Walker has provided verbal consent on 01/21/2021 for a virtual visit (video or telephone).   Marcille Buffy, FNP   Date: 01/21/2021 4:31 PM   Virtual Visit via Video Note   I, Denise Walker, connected with  Denise Walker  ([id number], 1990-04-27) on 01/21/21 at  4:15 PM EST by a video-enabled telemedicine application and verified that I am speaking with the correct person using two identifiers.  Location: Patient: Virtual Visit Location Patient: Home Provider: Virtual Visit Location Provider: Home Office   I discussed the limitations of evaluation and  management by telemedicine and the availability of in person appointments. The patient expressed understanding and agreed to proceed.    History of Present Illness: Denise Walker is a 30 y.o. who identifies as a female who was assigned female at birth, and is being seen today for she reports she was feeling better from 01/13/21 was feeling better for 2- 3 days, and all symptoms resolved and then returned worse.   Coughing up mucous, yellow. Felt cold last night did not check temperature.  Chest hurts with coughing. MILD WHEEZING.  Onset was 01/20/21, with cough and sore throat, lots of drainage and very painful swallowing.  Hoarse voice.  Strep history x 1 no other known. 4 Denies any other body aches.  Has tried benzonatate for cough.   She was on Augmentin - 12/18/20 for sinus infection. Cleared up well.   She was seen on 01/13/21 for productive cough, headache, fatigue, body aches that started on 01/12/21. Daughter has the flu. She at that time had 101 temperature. She was given Benzonatate  And Tamifllu at that visit.   Patient  denies any  body aches,rash, chest pain, shortness of breath, nausea, vomiting, or diarrhea.  Denies dizziness, lightheadedness, pre syncopal or syncopal episodes.   . No LMP recorded. (Menstrual status: IUD). Denies any pregnancy chance.    HPI: HPI  Problems:  Patient Active Problem List   Diagnosis Date  Noted   ADHD (attention deficit hyperactivity disorder), inattentive type 12/21/2020   Chronic post-traumatic stress disorder (PTSD) 12/21/2020   MDD (major depressive disorder), recurrent, in full remission ([place]) 11/15/2020   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   MDD (major depressive disorder), recurrent, in partial remission ([place]) 10/02/2020   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   Attention and concentration deficit 08/31/2020   Moderate episode of recurrent major depressive  disorder ([place]) 06/29/2020   High risk medication use 06/29/2020   History of ADHD 06/29/2020   Uterine scar from previous cesarean delivery affecting pregnancy 12/31/2018   Anti-D antibodies present during pregnancy 07-31-18   BRCA gene mutation negative 05/04/2018   History of cesarean delivery 02/10/2018   Rh negative state in antepartum period 08/23/2017   History of eclampsia 07/22/2017   Migraines 04/09/2017   GAD (generalized anxiety disorder) 12/01/2014    Allergies:  Allergies  Allergen Reactions   Fish Allergy Shortness Of Breath   Shellfish Allergy Shortness Of Breath   Medications:  Current Outpatient Medications:    albuterol (PROVENTIL) (2.5 MG/3ML) 0.083% nebulizer solution, Take 3 mLs (2.5 mg total) by nebulization every 6 (six) hours as needed for wheezing or shortness of breath., Disp: 75 mL, Rfl: 0   azithromycin (ZITHROMAX) 250 MG tablet, Take 2 tablets on day 1, then 1 tablet daily on days 2 through 5, Disp: 6 tablet, Rfl: 0   predniSONE (STERAPRED UNI-PAK 21 TAB) 10 MG (21) TBPK tablet, PO: Take 6 tablets on day 1:Take 5 tablets day 2:Take 4 tablets day 3: Take 3 tablets day 4:Take 2 tablets day five: 5 Take 1 tablet day 6, Disp: 21 tablet, Rfl: 0   amphetamine-dextroamphetamine (ADDERALL) 20 MG tablet, Take 0.5 tablets (10 mg total) by mouth 2 (two) times daily. Take half tablet daily at 7 AM and half tablet daily at 1 PM, Disp: 30 tablet, Rfl: 0   ARIPiprazole (ABILIFY) 5 MG tablet, Take 0.5 tablets (2.5 mg total) by mouth daily. Dose change, Disp: 30 tablet, Rfl: 1   benzonatate (TESSALON) 100 MG capsule, Take 1 capsule (100 mg total) by mouth 2 (two) times daily as needed for cough., Disp: 20 capsule, Rfl: 0   benztropine (COGENTIN) 0.5 MG tablet, TAKE 1 TABLET (0.5 MG TOTAL) BY MOUTH DAILY AS NEEDED FOR TREMORS., Disp: 30 tablet, Rfl: 1   Butalbital-APAP-Caffeine 50-325-40 MG capsule, Take by mouth., Disp: , Rfl:    citalopram (CELEXA) 10 MG tablet, TAKE 1  TABLET BY MOUTH EVERY DAY, Disp: 90 tablet, Rfl: 0   clotrimazole-betamethasone (LOTRISONE) cream, Apply topically 2 (two) times daily., Disp: , Rfl:    cyclobenzaprine (FLEXERIL) 10 MG tablet, Take 1 tablet by mouth as needed., Disp: , Rfl:    hydrOXYzine (ATARAX/VISTARIL) 25 MG tablet, TAKE 1-2 TABLETS (25-50 MG TOTAL) BY MOUTH DAILY AS NEEDED FOR ANXIETY., Disp: 180 tablet, Rfl: 0   lamoTRIgine (LAMICTAL) 200 MG tablet, Take 1 tablet (200 mg total) by mouth daily., Disp: 90 tablet, Rfl: 0   lamoTRIgine (LAMICTAL) 25 MG tablet, TAKE 2 TABLETS (50 MG TOTAL) BY MOUTH DAILY. TAKE ALONG WITH 200 MG DAILY - TOTAL OF 250 MG DAILY, Disp: 180 tablet, Rfl: 0   levonorgestrel (MIRENA) 20 MCG/24HR IUD, by Intrauterine route., Disp: , Rfl:    melatonin 3 MG TABS tablet, Take 1 tablet (3 mg total) by mouth at bedtime., Disp: 30 tablet, Rfl: 0   naproxen (NAPROSYN) 500 MG tablet, 1 po bid  prn, Disp: , Rfl:    ondansetron (ZOFRAN) 4 MG tablet, Take 1 tablet (4 mg total) by mouth every 8 (eight) hours as needed for nausea or vomiting., Disp: 20 tablet, Rfl: 0  Observations/Objective: Patient is well-developed, well-nourished in no acute distress.  Resting comfortably  at home.  Head is normocephalic, atraumatic.  No labored breathing.  Speech is clear and coherent with logical content.  Patient is alert and oriented at baseline.    Assessment and Plan: 1. Bacterial infection - predniSONE (STERAPRED UNI-PAK 21 TAB) 10 MG (21) TBPK tablet; PO: Take 6 tablets on day 1:Take 5 tablets day 2:Take 4 tablets day 3: Take 3 tablets day 4:Take 2 tablets day five: 5 Take 1 tablet day 6  Dispense: 21 tablet; Refill: 0 - azithromycin (ZITHROMAX) 250 MG tablet; Take 2 tablets on day 1, then 1 tablet daily on days 2 through 5  Dispense: 6 tablet; Refill: 0  2. History of influenza  3. Bronchitis - predniSONE (STERAPRED UNI-PAK 21 TAB) 10 MG (21) TBPK tablet; PO: Take 6 tablets on day 1:Take 5 tablets day 2:Take 4  tablets day 3: Take 3 tablets day 4:Take 2 tablets day five: 5 Take 1 tablet day 6  Dispense: 21 tablet; Refill: 0 - albuterol (PROVENTIL) (2.5 MG/3ML) 0.083% nebulizer solution; Take 3 mLs (2.5 mg total) by nebulization every 6 (six) hours as needed for wheezing or shortness of breath.  Dispense: 75 mL; Refill: 0  Suspect secondary bacterial infection given recent flu. Advised hr though to do a Covid test and call back if positive.  She has a nebulizer machine at home, discussed all medications with side effects.  She will continue benzonatate as previously prescribed for cough.  Mucinex plain  per package instructions.   Follow Up Instructions: I discussed the assessment and treatment plan with the patient. The patient was provided an opportunity to ask questions and all were answered. The patient agreed with the plan and demonstrated an understanding of the instructions.  A copy of instructions were sent to the patient via MyChart unless otherwise noted below.   Advised in person evaluation at anytime is advised if any symptoms do not improve, worsen or change at any given time.  Red Flags discussed. The patient was given clear instructions to go to ER or return to medical center if any red flags develop, symptoms do not improve, worsen or new problems develop. They verbalized understanding.   The patient was advised to call back or seek an in-person evaluation if the symptoms worsen or if the condition fails to improve as anticipated.  Time:  I spent 22  minutes with the patient via telehealth technology discussing the above problems/concerns.    Marcille Buffy, FNP

## 2021-01-21 NOTE — Patient Instructions (Addendum)
Denise Walker, thank you for joining Marcille Buffy, [place] for today's virtual visit.  While this provider is not your primary care provider (PCP), if your PCP is located in our provider database this encounter information will be shared with them immediately following your visit.  Consent: (Patient) Denise Walker provided verbal consent for this virtual visit at the beginning of the encounter.  Current Medications:  Current Outpatient Medications:    albuterol (PROVENTIL) (2.5 MG/3ML) 0.083% nebulizer solution, Take 3 mLs (2.5 mg total) by nebulization every 6 (six) hours as needed for wheezing or shortness of breath., Disp: 75 mL, Rfl: 0   azithromycin (ZITHROMAX) 250 MG tablet, Take 2 tablets on day 1, then 1 tablet daily on days 2 through 5, Disp: 6 tablet, Rfl: 0   predniSONE (STERAPRED UNI-PAK 21 TAB) 10 MG (21) TBPK tablet, PO: Take 6 tablets on day 1:Take 5 tablets day 2:Take 4 tablets day 3: Take 3 tablets day 4:Take 2 tablets day five: 5 Take 1 tablet day 6, Disp: 21 tablet, Rfl: 0   amphetamine-dextroamphetamine (ADDERALL) 20 MG tablet, Take 0.5 tablets (10 mg total) by mouth 2 (two) times daily. Take half tablet daily at 7 AM and half tablet daily at 1 PM, Disp: 30 tablet, Rfl: 0   ARIPiprazole (ABILIFY) 5 MG tablet, Take 0.5 tablets (2.5 mg total) by mouth daily. Dose change, Disp: 30 tablet, Rfl: 1   benzonatate (TESSALON) 100 MG capsule, Take 1 capsule (100 mg total) by mouth 2 (two) times daily as needed for cough., Disp: 20 capsule, Rfl: 0   benztropine (COGENTIN) 0.5 MG tablet, TAKE 1 TABLET (0.5 MG TOTAL) BY MOUTH DAILY AS NEEDED FOR TREMORS., Disp: 30 tablet, Rfl: 1   Butalbital-APAP-Caffeine 50-325-40 MG capsule, Take by mouth., Disp: , Rfl:    citalopram (CELEXA) 10 MG tablet, TAKE 1 TABLET BY MOUTH EVERY DAY, Disp: 90 tablet, Rfl: 0   clotrimazole-betamethasone (LOTRISONE) cream, Apply topically 2 (two) times daily., Disp: , Rfl:    cyclobenzaprine (FLEXERIL) 10 MG  tablet, Take 1 tablet by mouth as needed., Disp: , Rfl:    hydrOXYzine (ATARAX/VISTARIL) 25 MG tablet, TAKE 1-2 TABLETS (25-50 MG TOTAL) BY MOUTH DAILY AS NEEDED FOR ANXIETY., Disp: 180 tablet, Rfl: 0   lamoTRIgine (LAMICTAL) 200 MG tablet, Take 1 tablet (200 mg total) by mouth daily., Disp: 90 tablet, Rfl: 0   lamoTRIgine (LAMICTAL) 25 MG tablet, TAKE 2 TABLETS (50 MG TOTAL) BY MOUTH DAILY. TAKE ALONG WITH 200 MG DAILY - TOTAL OF 250 MG DAILY, Disp: 180 tablet, Rfl: 0   levonorgestrel (MIRENA) 20 MCG/24HR IUD, by Intrauterine route., Disp: , Rfl:    melatonin 3 MG TABS tablet, Take 1 tablet (3 mg total) by mouth at bedtime., Disp: 30 tablet, Rfl: 0   naproxen (NAPROSYN) 500 MG tablet, 1 po bid prn, Disp: , Rfl:    ondansetron (ZOFRAN) 4 MG tablet, Take 1 tablet (4 mg total) by mouth every 8 (eight) hours as needed for nausea or vomiting., Disp: 20 tablet, Rfl: 0   Medications ordered in this encounter:  Meds ordered this encounter  Medications   predniSONE (STERAPRED UNI-PAK 21 TAB) 10 MG (21) TBPK tablet    Sig: PO: Take 6 tablets on day 1:Take 5 tablets day 2:Take 4 tablets day 3: Take 3 tablets day 4:Take 2 tablets day five: 5 Take 1 tablet day 6    Dispense:  21 tablet    Refill:  0   azithromycin (ZITHROMAX) 250 MG tablet  Sig: Take 2 tablets on day 1, then 1 tablet daily on days 2 through 5    Dispense:  6 tablet    Refill:  0   albuterol (PROVENTIL) (2.5 MG/3ML) 0.083% nebulizer solution    Sig: Take 3 mLs (2.5 mg total) by nebulization every 6 (six) hours as needed for wheezing or shortness of breath.    Dispense:  75 mL    Refill:  0     *If you need refills on other medications prior to your next appointment, please contact your pharmacy*  Follow-Up: Call back or seek an in-person evaluation if the symptoms worsen or if the condition fails to improve as anticipated.  Advised in person evaluation at anytime is advised if any symptoms do not improve, worsen or change at any  given time.  Red Flags discussed. The patient was given clear instructions to go to ER or return to medical center if any red flags develop, symptoms do not improve, worsen or new problems develop. They verbalized understanding.    Acute Bronchitis, Adult Acute bronchitis is sudden inflammation of the main airways (bronchi) that come off the windpipe (trachea) in the lungs. The swelling causes the airways to get smaller and make more mucus than normal. This can make it hard to breathe and can cause coughing or noisy breathing (wheezing). Acute bronchitis may last several weeks. The cough may last longer. Allergies, asthma, and exposure to smoke may make the condition worse. What are the causes? This condition can be caused by germs and by substances that irritate the lungs, including: Cold and flu viruses. The most common cause of this condition is the virus that causes the common cold. Bacteria. This is less common. Breathing in substances that irritate the lungs, including: Smoke from cigarettes and other forms of tobacco. Dust and pollen. Fumes from household cleaning products, gases, or burned fuel. Indoor or outdoor air pollution. What increases the risk? The following factors may make you more likely to develop this condition: A weak body's defense system, also called the immune system. A condition that affects your lungs and breathing, such as asthma. What are the signs or symptoms? Common symptoms of this condition include: Coughing. This may bring up clear, yellow, or green mucus from your lungs (sputum). Wheezing. Runny or stuffy nose. Having too much mucus in your lungs (chest congestion). Shortness of breath. Aches and pains, including sore throat or chest. How is this diagnosed? This condition is usually diagnosed based on: Your symptoms and medical history. A physical exam. You may also have other tests, including tests to rule out other conditions, such as pneumonia.  These tests include: A test of lung function. Test of a mucus sample to look for the presence of bacteria. Tests to check the oxygen level in your blood. Blood tests. Chest X-ray. How is this treated? Most cases of acute bronchitis clear up over time without treatment. Your health care provider may recommend: Drinking more fluids to help thin your mucus so it is easier to cough up. Taking inhaled medicine (inhaler) to improve air flow in and out of your lungs. Using a vaporizer or a humidifier. These are machines that add water to the air to help you breathe better. Taking a medicine that thins mucus and clears congestion (expectorant). Taking a medicine that prevents or stops coughing (cough suppressant). It is notcommon to take an antibiotic medicine for this condition. Follow these instructions at home:  Take over-the-counter and prescription medicines only as told  by your health care provider. Use an inhaler, vaporizer, or humidifier as told by your health care provider. Take two teaspoons (10 mL) of honey at bedtime to lessen coughing at night. Drink enough fluid to keep your urine pale yellow. Do not use any products that contain nicotine or tobacco. These products include cigarettes, chewing tobacco, and vaping devices, such as e-cigarettes. If you need help quitting, ask your health care provider. Get plenty of rest. Return to your normal activities as told by your health care provider. Ask your health care provider what activities are safe for you. Keep all follow-up visits. This is important. How is this prevented? To lower your risk of getting this condition again: Wash your hands often with soap and water for at least 20 seconds. If soap and water are not available, use hand sanitizer. Avoid contact with people who have cold symptoms. Try not to touch your mouth, nose, or eyes with your hands. Avoid breathing in smoke or chemical fumes. Breathing smoke or chemical fumes will  make your condition worse. Get the flu shot every year. Contact a health care provider if: Your symptoms do not improve after 2 weeks. You have trouble coughing up the mucus. Your cough keeps you awake at night. You have a fever. Get help right away if you: Cough up blood. Feel pain in your chest. Have severe shortness of breath. Faint or keep feeling like you are going to faint. Have a severe headache. Have a fever or chills that get worse. These symptoms may represent a serious problem that is an emergency. Do not wait to see if the symptoms will go away. Get medical help right away. Call your local emergency services (911 in the [place] Do not drive yourself to the hospital. Summary Acute bronchitis is inflammation of the main airways (bronchi) that come off the windpipe (trachea) in the lungs. The swelling causes the airways to get smaller and make more mucus than normal. Drinking more fluids can help thin your mucus so it is easier to cough up. Take over-the-counter and prescription medicines only as told by your health care provider. Do not use any products that contain nicotine or tobacco. These products include cigarettes, chewing tobacco, and vaping devices, such as e-cigarettes. If you need help quitting, ask your health care provider. Contact a health care provider if your symptoms do not improve after 2 weeks. This information is not intended to replace advice given to you by your health care provider. Make sure you discuss any questions you have with your health care provider. Document Revised: 05/31/2020 Document Reviewed: 05/31/2020 Elsevier Patient Education  2022 [employer]   Albuterol Nebulizer Solution What is this medication? ALBUTEROL (al [address] B[address] ole) treats lung diseases, such as asthma, where the airways in the lungs narrow, causing breathing troubles or wheezing (bronchospasm). It is also used to treat asthma or prevent breathing problems during exercise. This  medication works by opening the airways of the lungs, making it easier to breathe. It is often called a rescue- or quick-relief medication. This medicine may be used for other purposes; ask your health care provider or pharmacist if you have questions. COMMON BRAND NAME(S): Accuneb, Proventil What should I tell my care team before I take this medication? They need to know if you have any of these conditions: Diabetes (high blood sugar) Heart disease High blood pressure Irregular heartbeat or rhythm Pheochromocytoma Seizures Thyroid disease An unusual or allergic reaction to albuterol, other medications, foods, dyes, or preservatives  Pregnant or trying to get pregnant Breast-feeding How should I use this medication? This medication is for inhalation using a nebulizer. Nebulizers make a liquid into an aerosol that you breathe in through your mouth or your mouth and nose and into your lungs. Do not mix this medication with other ones in the nebulizer. Take it as directed on the prescription label. Do not use it more often than directed. This medication comes with INSTRUCTIONS FOR USE. Ask your pharmacist for directions on how to use this medication. Read the information carefully. Talk to your pharmacist or care team if you have questions. Talk to your care team about the use of this medication in children. While it may be given to children as young as 2 for selected conditions, precautions do apply. Overdosage: If you think you have taken too much of this medicine contact a poison control center or emergency room at once. NOTE: This medicine is only for you. Do not share this medicine with others. What if I miss a dose? If you take this medication on a regular basis, take it as soon as you can. If it is almost time for your next dose, take only that dose. Do not take double or extra doses. What may interact with this medication? Caffeine Chloroquine Cisapride Diuretics Medications for  colds Medications for depression or for emotional or psychotic conditions Medications for weight loss including some herbal products Methadone Pentamidine Some antibiotics like clarithromycin, erythromycin, levofloxacin, and linezolid Some heart medications Steroid hormones like dexamethasone, cortisone, hydrocortisone Theophylline Thyroid hormones This list may not describe all possible interactions. Give your health care provider a list of all the medicines, herbs, non-prescription drugs, or dietary supplements you use. Also tell them if you smoke, drink alcohol, or use illegal drugs. Some items may interact with your medicine. What should I watch for while using this medication? Visit your care team for regular checks on your progress. Tell your care team if your symptoms do not start to get better or if they get worse. If your symptoms get worse or if you are using this medication more than normal, call your health care provider right away. Do not treat yourself for coughs, colds or allergies without asking your care team for advice. Some nonprescription medications can affect this one. You and your care team should develop an Asthma Action Plan that is just for you. Be sure to know what to do if you are in the yellow (asthma is getting worse) or red (medical alert) zones. Your mouth may get dry. Chewing sugarless gum or sucking hard candy and drinking plenty of water may help. Contact your care team if the problem does not go away or is severe. What side effects may I notice from receiving this medication? Side effects that you should report to your care team as soon as possible: Allergic reactions--skin rash, itching, hives, swelling of the face, lips, tongue, or throat Heart rhythm changes--fast or irregular heartbeat, dizziness, feeling faint or lightheaded, chest pain, trouble breathing Increase in blood pressure Muscle pain or cramps Wheezing or trouble breathing that is worse after  use Side effects that usually do not require medical attention (report to your care team if they continue or are bothersome): Change in taste Dry mouth Headache Sore throat Tremors or shaking Trouble sleeping This list may not describe all possible side effects. Call your doctor for medical advice about side effects. You may report side effects to FDA at 1-800-FDA-1088. Where should I keep my medication?  Keep out of the reach of children and pets. Store at room temperature between 20 and 25 degrees C (68 and 77 degrees F). Protect from light. Avoid exposure to extreme heat. Keep unopened vials in the foil pouch. See product for storage information. Each product may have different instructions. Get rid of any unused medication after it expires or 10 days after removing it from the foil pouch, whichever is first. To get rid of medications that are no longer needed or have expired: Take the medication to a medication take-back program. Check with your pharmacy or law enforcement to find a location. If you cannot return the medication, check the label or package insert to see if the medication should be thrown out in the garbage or flushed down the toilet. If you are not sure, ask your care team. If it is safe to put in the trash, empty the medication out of the container. Mix the medication with cat litter, dirt, coffee grounds, or other unwanted substance. Seal the mixture in a bag or container. Put it in the trash. NOTE: This sheet is a summary. It may not cover all possible information. If you have questions about this medicine, talk to your doctor, pharmacist, or health care provider.  2022 Elsevier/Gold Standard (2020-01-14 00:00:00) Prednisolone Tablets What is this medication? PREDNISOLONE (pred NISS oh lone) treats many conditions such as asthma, allergic reactions, arthritis, inflammatory bowel diseases, adrenal, and blood or bone marrow disorders. It works by decreasing inflammation,  slowing down an overactive immune system, or replacing cortisol normally made in the body. Cortisol is a hormone that plays an important role in how the body responds to stress, illness, and injury. It belongs to a group of medications called steroids. This medicine may be used for other purposes; ask your health care provider or pharmacist if you have questions. COMMON BRAND NAME(S): Millipred, Millipred DP, Millipred DP 12-Day, Millipred DP 6 Day, Prednoral What should I tell my care team before I take this medication? They need to know if you have any of these conditions: Cushing's syndrome Diabetes Glaucoma Heart problems or disease High blood pressure Infection such as herpes, measles, tuberculosis, or chickenpox Kidney disease Liver disease Mental problems Myasthenia gravis Osteoporosis Seizures Stomach ulcer or intestine disease including colitis and diverticulitis Thyroid problem An unusual or allergic reaction to lactose, prednisolone, other medications, foods, dyes, or preservatives Pregnant or trying to get pregnant Breast-feeding How should I use this medication? Take this medication by mouth with a glass of water. Follow the directions on the prescription label. Take it with food or milk to avoid stomach upset. If you are taking this medication once a day, take it in the morning. Do not take more medication than you are told to take. Do not suddenly stop taking your medication because you may develop a severe reaction. Your care team will tell you how much medication to take. If your care team wants you to stop the medication, the dose may be slowly lowered over time to avoid any side effects. Talk to your care team about the use of this medication in children. Special care may be needed. Overdosage: If you think you have taken too much of this medicine contact a poison control center or emergency room at once. NOTE: This medicine is only for you. Do not share this medicine  with others. What if I miss a dose? If you miss a dose, take it as soon as you can. If it is almost time for your  next dose, take only that dose. Do not take double or extra doses. What may interact with this medication? Do not take this medication with any of the following: Metyrapone Mifepristone This medication may also interact with the following: Aminoglutethimide Amphotericin B Aspirin and aspirin-like medications Barbiturates Certain medications for diabetes, like glipizide or glyburide Cholestyramine Cholinesterase inhibitors Cyclosporine Digoxin Diuretics Ephedrine Female hormones, like estrogens and birth control pills Isoniazid Ketoconazole NSAIDS, medications for pain and inflammation, like ibuprofen or naproxen Phenytoin Rifampin Toxoids Vaccines Warfarin This list may not describe all possible interactions. Give your health care provider a list of all the medicines, herbs, non-prescription drugs, or dietary supplements you use. Also tell them if you smoke, drink alcohol, or use illegal drugs. Some items may interact with your medicine. What should I watch for while using this medication? Visit your care team for regular checks on your progress. If you are taking this medication over a prolonged period, carry an identification card with your name and address, the type and dose of your medication, and your care team's name and address. This medication may increase your risk of getting an infection. Tell your care team if you are around anyone with measles or chickenpox, or if you develop sores or blisters that do not heal properly. If you are going to have surgery, tell your care team that you have taken this medication within the last twelve months. Ask your care team about your diet. You may need to lower the amount of salt you eat. This medication may increase blood sugar. Ask your care team if changes in diet or medications are needed if you have diabetes. What side  effects may I notice from receiving this medication? Side effects that you should report to your care team as soon as possible: Allergic reactions--skin rash, itching, hives, swelling of the face, lips, tongue, or throat Cushing syndrome--increased fat around the midsection, upper back, neck, or face, pink or purple stretch marks on the skin, thinning, fragile skin that easily bruises, unexpected hair growth High blood sugar (hyperglycemia)--increased thirst or amount of urine, unusual weakness or fatigue, blurry vision Increase in blood pressure Infection--fever, chills, cough, sore throat, wounds that don't heal, pain or trouble when passing urine, general feeling of discomfort or being unwell Low adrenal gland function--nausea, vomiting, loss of appetite, unusual weakness or fatigue, dizziness Mood and behavior changes--anxiety, nervousness, confusion, hallucinations, irritability, hostility, thoughts of suicide or self-harm, worsening mood, feelings of depression Stomach bleeding--bloody or black, tar-like stools, vomiting blood or brown material that looks like coffee grounds Swelling of the ankles, hands, or feet Side effects that usually do not require medical attention (report to your care team if they continue or are bothersome): Acne General discomfort and fatigue Headache Increase in appetite Nausea Trouble sleeping Weight gain This list may not describe all possible side effects. Call your doctor for medical advice about side effects. You may report side effects to FDA at 1-800-FDA-1088. Where should I keep my medication? Keep out of the reach of children. Store at room temperature between 15 and 30 degrees C (59 and 86 degrees F). Keep container tightly closed. Throw away any unused medication after the expiration date. NOTE: This sheet is a summary. It may not cover all possible information. If you have questions about this medicine, talk to your doctor, pharmacist, or health  care provider.  2022 Elsevier/Gold Standard (2020-04-28 00:00:00) Azithromycin Tablets What is this medication? AZITHROMYCIN (az ith roe MYE sin) treats infections caused by bacteria. It  belongs to a group of medications called antibiotics. It will not treat colds, the flu, or infections caused by viruses. This medicine may be used for other purposes; ask your health care provider or pharmacist if you have questions. COMMON BRAND NAME(S): Zithromax, Zithromax Tri-Pak, Zithromax Z-Pak What should I tell my care team before I take this medication? They need to know if you have any of these conditions: History of blood diseases, like leukemia History of irregular heartbeat Kidney disease Liver disease Myasthenia gravis An unusual or allergic reaction to azithromycin, erythromycin, other macrolide antibiotics, foods, dyes, or preservatives Pregnant or trying to get pregnant Breast-feeding How should I use this medication? Take this medication by mouth with a full glass of water. Follow the directions on the prescription label. The tablets can be taken with food or on an empty stomach. If the medication upsets your stomach, take it with food. Take your medication at regular intervals. Do not take your medication more often than directed. Take all of your medication as directed even if you think you are better. Do not skip doses or stop your medication early. Talk to your care team regarding the use of this medication in children. While this medication may be prescribed for children as young as 6 months for selected conditions, precautions do apply. Overdosage: If you think you have taken too much of this medicine contact a poison control center or emergency room at once. NOTE: This medicine is only for you. Do not share this medicine with others. What if I miss a dose? If you miss a dose, take it as soon as you can. If it is almost time for your next dose, take only that dose. Do not take double  or extra doses. What may interact with this medication? Do not take this medication with any of the following: Cisapride Dronedarone Pimozide Thioridazine This medication may also interact with the following: Antacids that contain aluminum or magnesium Birth control pills Colchicine Cyclosporine Digoxin Ergot alkaloids like dihydroergotamine, ergotamine Nelfinavir Other medications that prolong the QT interval (an abnormal heart rhythm) Phenytoin Warfarin This list may not describe all possible interactions. Give your health care provider a list of all the medicines, herbs, non-prescription drugs, or dietary supplements you use. Also tell them if you smoke, drink alcohol, or use illegal drugs. Some items may interact with your medicine. What should I watch for while using this medication? Tell your care team if your symptoms do not start to get better or if they get worse. This medication may cause serious skin reactions. They can happen weeks to months after starting the medication. Contact your care team right away if you notice fevers or flu-like symptoms with a rash. The rash may be red or purple and then turn into blisters or peeling of the skin. Or, you might notice a red rash with swelling of the face, lips or lymph nodes in your neck or under your arms. Do not treat diarrhea with over the counter products. Contact your care team if you have diarrhea that lasts more than 2 days or if it is severe and watery. This medication can make you more sensitive to the sun. Keep out of the sun. If you cannot avoid being in the sun, wear protective clothing and use sunscreen. Do not use sun lamps or tanning beds/booths. What side effects may I notice from receiving this medication? Side effects that you should report to your care team as soon as possible: Allergic reactions or angioedema--skin rash,  itching, hives, swelling of the face, eyes, lips, tongue, arms, or legs, trouble swallowing or  breathing Heart rhythm changes--fast or irregular heartbeat, dizziness, feeling faint or lightheaded, chest pain, trouble breathing Liver injury--right upper belly pain, loss of appetite, nausea, light-colored stool, dark yellow or brown urine, yellowing skin or eyes, unusual weakness or fatigue Rash, fever, and swollen lymph nodes Redness, blistering, peeling, or loosening of the skin, including inside the mouth Severe diarrhea, fever Unusual vaginal discharge, itching, or odor Side effects that usually do not require medical attention (report to your care team if they continue or are bothersome): Diarrhea Nausea Stomach pain Vomiting This list may not describe all possible side effects. Call your doctor for medical advice about side effects. You may report side effects to FDA at 1-800-FDA-1088. Where should I keep my medication? Keep out of the reach of children and pets. Store at room temperature between 15 and 30 degrees C (59 and 86 degrees F). Throw away any unused medication after the expiration date. NOTE: This sheet is a summary. It may not cover all possible information. If you have questions about this medicine, talk to your doctor, pharmacist, or health care provider.  2022 Elsevier/Gold Standard (2020-01-24 00:00:00)  Other Instruction   If you have been instructed to have an in-person evaluation today at a local Urgent Care facility, please use the link below. It will take you to a list of all of our available [facility], including address, phone number and hours of operation. Please do not delay care.  [facility]  If you or a family member do not have a primary care provider, use the link below to schedule a visit and establish care. When you choose a [facility] primary care physician or advanced practice provider, you gain a long-term partner in health. Find a Primary Care Provider  Learn more about [facility]'s in-office and virtual care  options: [facility]

## 2021-01-23 ENCOUNTER — Telehealth: Payer: 59 | Admitting: Emergency Medicine

## 2021-01-23 DIAGNOSIS — J040 Acute laryngitis: Secondary | ICD-10-CM

## 2021-01-23 NOTE — Progress Notes (Signed)
Virtual Visit Consent   Denise Walker, you are scheduled for a virtual visit with a [facility] provider today.     Just as with appointments in the office, your consent must be obtained to participate.  Your consent will be active for this visit and any virtual visit you may have with one of our providers in the next 365 days.     If you have a MyChart account, a copy of this consent can be sent to you electronically.  All virtual visits are billed to your insurance company just like a traditional visit in the office.    As this is a virtual visit, video technology does not allow for your provider to perform a traditional examination.  This may limit your provider's ability to fully assess your condition.  If your provider identifies any concerns that need to be evaluated in person or the need to arrange testing (such as labs, EKG, etc.), we will make arrangements to do so.     Although advances in technology are sophisticated, we cannot ensure that it will always work on either your end or our end.  If the connection with a video visit is poor, the visit may have to be switched to a telephone visit.  With either a video or telephone visit, we are not always able to ensure that we have a secure connection.     I need to obtain your verbal consent now.   Are you willing to proceed with your visit today?    Denise Walker has provided verbal consent on 01/23/2021 for a virtual visit (video or telephone).   Denise Getting, NP   Date: 01/23/2021 12:50 PM   Virtual Visit via Video Note   I, Denise Walker, connected with  Denise Walker  ([id number], 1990-03-04) on 01/23/21 at 12:40 PM EST by a video-enabled telemedicine application and verified that I am speaking with the correct person using two identifiers.  Location: Patient: Virtual Visit Location Patient: Home Provider: Virtual Visit Location Provider: Home Office   I discussed the limitations of evaluation and management by telemedicine  and the availability of in person appointments. The patient expressed understanding and agreed to proceed.    History of Present Illness: Denise Walker is a 30 y.o. who identifies as a female who was assigned female at birth, and is being seen today for hoarse voice and coughing after using albuterol.  In early December, patient had a visit for likely influenza and treated with Tamiflu.  On 01/21/2021, patient was seen again in a virtual visit and thought to have a secondary bacterial infection.  She was prescribed a Z-Pak, prednisone, albuterol nebulizer.  She reports she is feeling better except her voice is hoarse now and she is worried about losing her voice.  She also wonders why she coughs so much after using albuterol.  She reports after using the albuterol nebulizer (which she is using twice a day), she has a productive cough and produces a lot of sputum.  After a time of coughing, she reports the coughing stops, and her breathing is much easier.  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   Chronic post-traumatic stress disorder (PTSD) 12/21/2020   MDD (major depressive disorder), recurrent, in full remission ([place]) 11/15/2020   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  MDD (major depressive disorder), recurrent, in partial remission ([place]) 10/02/2020   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   Attention and concentration deficit 08/31/2020   Moderate episode of recurrent major depressive disorder ([place]) 06/29/2020   High risk medication use 06/29/2020   History of ADHD 06/29/2020   Uterine scar from previous cesarean delivery affecting pregnancy 12/31/2018   Anti-D antibodies present during pregnancy 07/13/2018   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 05/04/2018   History of cesarean delivery 02/10/2018   Rh negative state in antepartum period 08/23/2017   History of eclampsia 07/22/2017   Migraines 04/09/2017   GAD (generalized anxiety disorder) 12/01/2014    Allergies:  Allergies  Allergen Reactions   Fish Allergy Shortness Of Breath   Shellfish Allergy Shortness Of Breath   Medications:  Current Outpatient Medications:    albuterol (PROVENTIL) (2.5 MG/3ML) 0.083% nebulizer solution, Take 3 mLs (2.5 mg total) by nebulization every 6 (six) hours as needed for wheezing or shortness of breath., Disp: 75 mL, Rfl: 0   amphetamine-dextroamphetamine (ADDERALL) 20 MG tablet, Take 0.5 tablets (10 mg total) by mouth 2 (two) times daily. Take half tablet daily at 7 AM and half tablet daily at 1 PM, Disp: 30 tablet, Rfl: 0   ARIPiprazole (ABILIFY) 5 MG tablet, Take 0.5 tablets (2.5 mg total) by mouth daily. Dose change, Disp: 30 tablet, Rfl: 1   azithromycin (ZITHROMAX) 250 MG tablet, Take 2 tablets on day 1, then 1 tablet daily on days 2 through 5, Disp: 6 tablet, Rfl: 0   benzonatate (TESSALON) 100 MG capsule, Take 1 capsule (100 mg total) by mouth 2 (two) times daily as needed for cough., Disp: 20 capsule, Rfl: 0   benztropine (COGENTIN) 0.5 MG tablet, TAKE 1 TABLET (0.5 MG TOTAL) BY MOUTH DAILY AS NEEDED FOR TREMORS., Disp: 30 tablet, Rfl: 1   Butalbital-APAP-Caffeine 50-325-40 MG capsule, Take by mouth., Disp: , Rfl:    citalopram (CELEXA) 10 MG tablet, TAKE 1 TABLET BY MOUTH EVERY DAY, Disp: 90 tablet, Rfl: 0   clotrimazole-betamethasone (LOTRISONE) cream, Apply topically 2 (two) times daily., Disp: , Rfl:    cyclobenzaprine (FLEXERIL) 10 MG tablet, Take 1 tablet by mouth as needed., Disp: , Rfl:    hydrOXYzine (ATARAX/VISTARIL) 25 MG tablet, TAKE 1-2 TABLETS (25-50 MG TOTAL) BY MOUTH DAILY AS NEEDED FOR ANXIETY., Disp: 180 tablet, Rfl: 0   lamoTRIgine (LAMICTAL) 200 MG tablet, Take 1 tablet (200 mg total) by mouth daily.,  Disp: 90 tablet, Rfl: 0   lamoTRIgine (LAMICTAL) 25 MG tablet, TAKE 2 TABLETS (50 MG TOTAL) BY MOUTH DAILY. TAKE ALONG WITH 200 MG DAILY - TOTAL OF 250 MG DAILY, Disp: 180 tablet, Rfl: 0   levonorgestrel (MIRENA) 20 MCG/24HR IUD, by Intrauterine route., Disp: , Rfl:    melatonin 3 MG TABS tablet, Take 1 tablet (3 mg total) by mouth at bedtime., Disp: 30 tablet, Rfl: 0   naproxen (NAPROSYN) 500 MG tablet, 1 po bid prn, Disp: , Rfl:    ondansetron (ZOFRAN) 4 MG tablet, Take 1 tablet (4 mg total) by mouth every 8 (eight) hours as needed for nausea or vomiting., Disp: 20 tablet, Rfl: 0   predniSONE (STERAPRED UNI-PAK 21 TAB) 10 MG (21) TBPK tablet, PO: Take 6 tablets on day 1:Take 5 tablets day 2:Take 4 tablets day 3: Take 3 tablets day 4:Take 2 tablets day five: 5 Take 1 tablet day 6, Disp: 21 tablet, Rfl: 0  Observations/Objective: Patient is well-developed,  well-nourished in no acute distress.  Resting comfortably  at home.  Head is normocephalic, atraumatic.  No labored breathing.  Speech is clear and coherent with logical content.  Patient is alert and oriented at baseline.  Patient's voice is hoarse but she otherwise looks well.   Assessment and Plan: 1. Laryngitis Reassured patient her hoarse voice will recover eventually.  Recommended vocal rest.  Recommended gargling with salt water and tea with honey and lemon.  Explained to patient how albuterol works and why it is a good thing that she coughs up sputum after using the albuterol.  Advised patient to use albuterol nebulizer every 4-6 hours as needed.  Discussed reasons for seeking in person care.  Follow Up Instructions: I discussed the assessment and treatment plan with the patient. The patient was provided an opportunity to ask questions and all were answered. The patient agreed with the plan and demonstrated an understanding of the instructions.  A copy of instructions were sent to the patient via MyChart unless otherwise noted  below.   The patient was advised to call back or seek an in-person evaluation if the symptoms worsen or if the condition fails to improve as anticipated.  Time:  I spent 6 minutes with the patient via telehealth technology discussing the above problems/concerns.    Denise Getting, NP

## 2021-01-24 ENCOUNTER — Ambulatory Visit (INDEPENDENT_AMBULATORY_CARE_PROVIDER_SITE_OTHER): Payer: 59

## 2021-01-24 ENCOUNTER — Other Ambulatory Visit: Payer: Self-pay

## 2021-01-24 ENCOUNTER — Ambulatory Visit
Admission: EM | Admit: 2021-01-24 | Discharge: 2021-01-24 | Disposition: A | Discharge status: Home / Self Care | Payer: 59 | Attending: Physician Assistant | Admitting: Physician Assistant

## 2021-01-24 DIAGNOSIS — R0602 Shortness of breath: Secondary | ICD-10-CM | POA: Diagnosis not present

## 2021-01-24 DIAGNOSIS — J040 Acute laryngitis: Secondary | ICD-10-CM | POA: Diagnosis not present

## 2021-01-24 DIAGNOSIS — Z20828 Contact with and (suspected) exposure to other viral communicable diseases: Secondary | ICD-10-CM

## 2021-01-24 DIAGNOSIS — J209 Acute bronchitis, unspecified: Secondary | ICD-10-CM

## 2021-01-24 DIAGNOSIS — R059 Cough, unspecified: Secondary | ICD-10-CM

## 2021-01-24 MED ORDER — CHERATUSSIN AC 100-10 MG/5ML PO SOLN: 10.0000 mL | Freq: Three times a day (TID) | ORAL | 0 refills | Status: DC | PRN

## 2021-01-24 NOTE — ED Triage Notes (Signed)
Patient presents to Urgent Care with complaints of continued productive cough, SOB with exertion, hoarseness. She had two virtual visits one Sunday and one yesterday. Prescribed prednisone, antibiotic, albuterol. Last known fever on Saturday night.

## 2021-01-24 NOTE — Discharge Instructions (Signed)
-  I will call when I get the results of your chest x-ray. - If you have pneumonia seen on your chest x-ray I can prescribe a different antibiotic.  If your chest x-ray is clear you likely have a viral bronchitis and laryngitis which will take some time to run its course so I would go ahead and continue the prednisone and albuterol if you need it.  I can send cough medicine to the pharmacy for you.  You should increase rest and fluids. - Symptoms can last for couple of weeks sometimes to you may be sick for another week or 2 but you should not get a fever.  If you do get a fever or increased pain in your chest or breathing trouble he should be seen again.  Please make an in person follow-up with your PCP if not improving over the next couple weeks. - Please go to ER for any severe acute worsening of your symptoms.

## 2021-01-24 NOTE — ED Provider Notes (Signed)
[facility]    CSN: [id number] Arrival date & time: 01/24/21  1402      History   Chief Complaint Chief Complaint  Patient presents with   Shortness of Breath   Sore Throat    HPI Denise Walker is a 30 y.o. female presenting for greater than 1 week history of cough and congestion.  Patient says over the past 4 days she has started to have a productive cough and voice hoarseness.  Also reports shortness of breath when she exerts herself.  Patient has had the following telemedicine visits on the following dates.  11/7: treated for bacterial sinusitis with Augmentin 12/3: treated for flu like symptoms. Daughter had flu. Given benzonatate and Tamiflu 12/11: treated for bacterial bronchitis with albuterol, azithromycin, prednisone taper (6 days) 12/12: advised continuing meds and seeking in person evaluation  Today, she states that she is almost done with the azithromycin and prednisone and is not feeling any better.  Reports actually feeling worse.  States that her whole house had the flu recently.  She did take the Tamiflu as prescribed.  Patient has been using albuterol which has helped.  She is not taking any cough medication presently.  She does not have any history of asthma.  Patient is a non-smoker.  Presently she is denying any fevers.  Has been fatigued.  Reports that her chest hurts when she coughs.  No vomiting or diarrhea.  No other complaints.  HPI  Past Medical History:  Diagnosis Date   Anxiety    Back pain    low back pain with lumbar radiculitis   Bilateral shoulder pain    BRCA negative 04/2018   MyRisk neg; IBIS=8.9%/riskscore=6.7%   Eclampsia    Family history of ovarian cancer    Gestational diabetes    Migraine headache    PCOS (polycystic ovarian syndrome)     Patient Active Problem List   Diagnosis Date Noted   Bacterial infection 01/21/2021   History of influenza 01/21/2021   Bronchitis 01/21/2021   ADHD (attention deficit  hyperactivity disorder), inattentive type 12/21/2020   Chronic post-traumatic stress disorder (PTSD) 12/21/2020   MDD (major depressive disorder), recurrent, in full remission ([place]) 11/15/2020   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   MDD (major depressive disorder), recurrent, in partial remission ([place]) 10/02/2020   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   Attention and concentration deficit 08/31/2020   Moderate episode of recurrent major depressive disorder ([place]) 06/29/2020   High risk medication use 06/29/2020   History of ADHD 06/29/2020   Uterine scar from previous cesarean delivery affecting pregnancy 12/31/2018   Anti-D antibodies present during pregnancy July 25, 2018   BRCA gene mutation negative 05/04/2018   History of cesarean delivery 02/10/2018   Rh negative state in antepartum period 08/23/2017   History of eclampsia 07/22/2017   Migraines 04/09/2017   GAD (generalized anxiety disorder) 12/01/2014    Past Surgical History:  Procedure Laterality Date   CESAREAN SECTION N/A 07/15/2014   Procedure: CESAREAN SECTION;  Surgeon: Gae Dry, MD;  Location: [facility];  Service: Obstetrics;  Laterality: N/A;   CESAREAN SECTION N/A 02/10/2018   Procedure: CESAREAN SECTION;  Surgeon: Will Bonnet, MD;  Location: [facility];  Service: Obstetrics;  Laterality: N/A;   CESAREAN SECTION N/A 12/31/2018   Procedure: CESAREAN SECTION;  Surgeon: [name], MD;  Location: [facility];  Service: Obstetrics;  Laterality: N/A;   CESAREAN SECTION  HERNIA REPAIR  10/2013   WISDOM TOOTH EXTRACTION      OB History     Gravida  6   Para  3   Term  3   Preterm      AB  3   Living  3      SAB  3   IAB      Ectopic      Multiple  0   Live Births  3            Home Medications    Prior to Admission medications   Medication Sig Start Date End Date Taking? Authorizing Provider   albuterol (PROVENTIL) (2.5 MG/3ML) 0.083% nebulizer solution Take 3 mLs (2.5 mg total) by nebulization every 6 (six) hours as needed for wheezing or shortness of breath. 01/21/21   Flinchum, Kelby Aline, FNP  amphetamine-dextroamphetamine (ADDERALL) 20 MG tablet Take 0.5 tablets (10 mg total) by mouth 2 (two) times daily. Take half tablet daily at 7 AM and half tablet daily at 1 PM 12/21/20   Eappen, Ria Clock, MD  ARIPiprazole (ABILIFY) 5 MG tablet Take 0.5 tablets (2.5 mg total) by mouth daily. Dose change 11/23/20   Ursula Alert, MD  azithromycin (ZITHROMAX) 250 MG tablet Take 2 tablets on day 1, then 1 tablet daily on days 2 through 5 01/21/21 01/26/21  Flinchum, Kelby Aline, FNP  benzonatate (TESSALON) 100 MG capsule Take 1 capsule (100 mg total) by mouth 2 (two) times daily as needed for cough. 01/13/21   Wurst, [place], PA-C  benztropine (COGENTIN) 0.5 MG tablet TAKE 1 TABLET (0.5 MG TOTAL) BY MOUTH DAILY AS NEEDED FOR TREMORS. 10/26/20   Ursula Alert, MD  Butalbital-APAP-Caffeine [phone] MG capsule Take by mouth. 10/11/19   [provider]  citalopram (CELEXA) 10 MG tablet TAKE 1 TABLET BY MOUTH EVERY DAY 10/20/20   Ursula Alert, MD  clotrimazole-betamethasone (LOTRISONE) cream Apply topically 2 (two) times daily. 10/30/20 10/30/21  [provider]  cyclobenzaprine (FLEXERIL) 10 MG tablet Take 1 tablet by mouth as needed. 04/16/20   [provider]  hydrOXYzine (ATARAX/VISTARIL) 25 MG tablet TAKE 1-2 TABLETS (25-50 MG TOTAL) BY MOUTH DAILY AS NEEDED FOR ANXIETY. 10/16/20   Ursula Alert, MD  lamoTRIgine (LAMICTAL) 200 MG tablet Take 1 tablet (200 mg total) by mouth daily. 11/15/20   Ursula Alert, MD  lamoTRIgine (LAMICTAL) 25 MG tablet TAKE 2 TABLETS (50 MG TOTAL) BY MOUTH DAILY. TAKE ALONG WITH 200 MG DAILY - TOTAL OF 250 MG DAILY 10/31/20   Ursula Alert, MD  levonorgestrel (MIRENA) 20 MCG/24HR IUD by Intrauterine route.    [provider]  melatonin 3  MG TABS tablet Take 1 tablet (3 mg total) by mouth at bedtime. 11/23/20   Ursula Alert, MD  naproxen (NAPROSYN) 500 MG tablet 1 po bid prn 07/26/20   [provider]  ondansetron (ZOFRAN) 4 MG tablet Take 1 tablet (4 mg total) by mouth every 8 (eight) hours as needed for nausea or vomiting. 01/01/21   Mar Daring, PA-C  predniSONE (STERAPRED UNI-PAK 21 TAB) 10 MG (21) TBPK tablet PO: Take 6 tablets on day 1:Take 5 tablets day 2:Take 4 tablets day 3: Take 3 tablets day 4:Take 2 tablets day five: 5 Take 1 tablet day 6 01/21/21   Flinchum, Kelby Aline, FNP    Family History Family History  Problem Relation Age of Onset   Bipolar disorder Mother    Ovarian cancer Mother    Arthritis Mother  Ovarian cancer Maternal Aunt    Diabetes Paternal Aunt    Breast cancer Maternal Grandmother    Diabetes Paternal Grandfather    Uterine cancer Cousin    ADD / ADHD Daughter    ODD Daughter     Social History Social History   Tobacco Use   Smoking status: Never   Smokeless tobacco: Never  Vaping Use   Vaping Use: Never used  Substance Use Topics   Alcohol use: Yes    Comment: occasional wine cooler   Drug use: Never     Allergies   Fish allergy and Shellfish allergy   Review of Systems Review of Systems  Constitutional:  Positive for fatigue. Negative for chills, diaphoresis and fever.  HENT:  Positive for congestion, rhinorrhea, sore throat (mild, scratchy) and voice change. Negative for ear pain, sinus pressure and sinus pain.   Respiratory:  Positive for cough and shortness of breath.   Cardiovascular:  Positive for chest pain.  Gastrointestinal:  Negative for abdominal pain, nausea and vomiting.  Musculoskeletal:  Negative for arthralgias and myalgias.  Skin:  Negative for rash.  Neurological:  Negative for weakness and headaches.  Hematological:  Negative for adenopathy.    Physical Exam Triage Vital Signs ED Triage Vitals  Enc Vitals Group     BP  01/24/21 1423 108/72     Pulse Rate 01/24/21 1423 85     Resp 01/24/21 1423 16     Temp 01/24/21 1423 98.3 F (36.8 C)     Temp Source 01/24/21 1423 Oral     SpO2 01/24/21 1423 99 %     Weight --      Height --      Head Circumference --      Peak Flow --      Pain Score 01/24/21 1422 0     Pain Loc --      Pain Edu? --      Excl. in [place]? --    No data found.  Updated Vital Signs BP 108/72 (BP Location: Left Arm)    Pulse 85    Temp 98.3 F (36.8 C) (Oral)    Resp 16    SpO2 99%      Physical Exam Vitals and nursing note reviewed.  Constitutional:      General: She is not in acute distress.    Appearance: Normal appearance. She is well-developed. She is ill-appearing. She is not toxic-appearing.     Comments: +voice hoarseness  HENT:     Head: Normocephalic and atraumatic.     Nose: Congestion present.     Mouth/Throat:     Mouth: Mucous membranes are moist.     Pharynx: Oropharynx is clear.  Eyes:     General: No scleral icterus.       Right eye: No discharge.        Left eye: No discharge.     Conjunctiva/sclera: Conjunctivae normal.  Cardiovascular:     Rate and Rhythm: Normal rate and regular rhythm.     Heart sounds: Normal heart sounds.  Pulmonary:     Effort: Pulmonary effort is normal. No respiratory distress.     Breath sounds: Normal breath sounds. No wheezing, rhonchi or rales.  Musculoskeletal:     Cervical back: Neck supple.  Skin:    General: Skin is dry.  Neurological:     General: No focal deficit present.     Mental Status: She is alert. Mental status is at baseline.  Motor: No weakness.     Gait: Gait normal.  Psychiatric:        Mood and Affect: Mood normal.        Behavior: Behavior normal.        Thought Content: Thought content normal.     UC Treatments / Results  Labs (all labs ordered are listed, but only abnormal results are displayed) Labs Reviewed - No data to display  EKG   Radiology DG Chest 2 View  Result Date:  01/24/2021 CLINICAL DATA:  Patient presents to Urgent Care with complaints of continued productive cough, SOB with exertion, hoarseness. She had two virtual visits one Sunday and one yesterday. Prescribed prednisone, antibiotic, albuterol. Last known fever on Saturday night EXAM: CHEST - 2 VIEW COMPARISON:  12/28/2012 FINDINGS: Lungs are clear. Heart size and mediastinal contours are within normal limits. No effusion. Visualized bones unremarkable. IMPRESSION: No acute cardiopulmonary disease. Electronically Signed   By: Lucrezia Europe M.D.   On: 01/24/2021 16:18    Procedures Procedures (including critical care time)  Medications Ordered in UC Medications - No data to display  Initial Impression / Assessment and Plan / UC Course  I have reviewed the triage vital signs and the nursing notes.  Pertinent labs & imaging results that were available during my care of the patient were reviewed by me and considered in my medical decision making (see chart for details).  30 year old female presenting for greater than 1 week history of cough and congestion.  Also reports sore throat, voice hoarseness, productive cough, shortness of breath on exertion and fatigue.  As noted in HPI patient has had multiple medications over the past month including Augmentin, Tamiflu, benzonatate, prednisone, azithromycin, and albuterol.  Vitals are stable today.  She is mildly ill-appearing but nontoxic.  She does have obvious voice hoarseness.  Patient is also trying to manage 3 children and having to raise her voice at times because they will not listen to her so I am sure this makes things worse.  Nasal congestion on exam.  Chest clear to auscultation heart regular rate and rhythm.  Chest x-ray performed today does not show any evidence of pneumonia.  Patient notified of this.  Advised her that her symptoms are all consistent with a virus, likely the flu since she had flu exposure.  Advised her to go ahead and finish the  azithromycin since she started in the prednisone and use albuterol as needed.  Sent cough medicine to pharmacy.  Advised following up for any worsening symptoms especially if she were to develop fever, worsening cough, increased chest pain or more breathing difficulty.  Patient agrees to the plan.   Final Clinical Impressions(s) / UC Diagnoses   Final diagnoses:  Acute laryngitis  Acute bronchitis, unspecified organism  Exposure to the flu     Discharge Instructions      -I will call when I get the results of your chest x-ray. - If you have pneumonia seen on your chest x-ray I can prescribe a different antibiotic.  If your chest x-ray is clear you likely have a viral bronchitis and laryngitis which will take some time to run its course so I would go ahead and continue the prednisone and albuterol if you need it.  I can send cough medicine to the pharmacy for you.  You should increase rest and fluids. - Symptoms can last for couple of weeks sometimes to you may be sick for another week or 2 but you should not  get a fever.  If you do get a fever or increased pain in your chest or breathing trouble he should be seen again.  Please make an in person follow-up with your PCP if not improving over the next couple weeks. - Please go to ER for any severe acute worsening of your symptoms.     ED Prescriptions   None    PDMP not reviewed this encounter.   Danton Clap, PA-C 01/24/21 1658

## 2021-01-30 ENCOUNTER — Telehealth: Payer: 59 | Admitting: Physician Assistant

## 2021-01-30 DIAGNOSIS — J040 Acute laryngitis: Secondary | ICD-10-CM | POA: Diagnosis not present

## 2021-01-30 DIAGNOSIS — J209 Acute bronchitis, unspecified: Secondary | ICD-10-CM | POA: Diagnosis not present

## 2021-01-30 MED ORDER — DOXYCYCLINE HYCLATE 100 MG PO TABS: 100.0000 mg | ORAL_TABLET | Freq: Two times a day (BID) | ORAL | 0 refills | Status: DC

## 2021-01-30 MED ORDER — PREDNISONE 20 MG PO TABS: 40.0000 mg | ORAL_TABLET | Freq: Every day | ORAL | 0 refills | Status: DC

## 2021-01-30 NOTE — Progress Notes (Signed)
Virtual Visit Consent   Denise Walker, you are scheduled for a virtual visit with a [facility] provider today.     Just as with appointments in the office, your consent must be obtained to participate.  Your consent will be active for this visit and any virtual visit you may have with one of our providers in the next 365 days.     If you have a MyChart account, a copy of this consent can be sent to you electronically.  All virtual visits are billed to your insurance company just like a traditional visit in the office.    As this is a virtual visit, video technology does not allow for your provider to perform a traditional examination.  This may limit your provider's ability to fully assess your condition.  If your provider identifies any concerns that need to be evaluated in person or the need to arrange testing (such as labs, EKG, etc.), we will make arrangements to do so.     Although advances in technology are sophisticated, we cannot ensure that it will always work on either your end or our end.  If the connection with a video visit is poor, the visit may have to be switched to a telephone visit.  With either a video or telephone visit, we are not always able to ensure that we have a secure connection.     I need to obtain your verbal consent now.   Are you willing to proceed with your visit today?    Denise Walker has provided verbal consent on 01/30/2021 for a virtual visit (video or telephone).   Denise Walker, [place]   Date: 01/30/2021 2:23 PM   Virtual Visit via Video Note   I, Denise Walker, connected with  Denise Walker  ([id number], [birth year]) on 01/30/21 at  2:15 PM EST by a video-enabled telemedicine application and verified that I am speaking with the correct person using two identifiers.  Location: Patient: Virtual Visit Location Patient: Home Provider: Virtual Visit Location Provider: Home Office   I discussed the limitations of evaluation and management by  telemedicine and the availability of in person appointments. The patient expressed understanding and agreed to proceed.    History of Present Illness: Denise Walker is a 30 y.o. who identifies as a female who was assigned female at birth, and is being seen today for recurrence of URI symptoms and associated voice hoarseness. She was originally treated for bronchitis and laryngitis a couple of weeks ago with steroid taper and z-pack. Noted improvement in symptoms while on treatment without full-resolution. After that symptoms, especially cough, kept being persistent so she was sent to a local Urgent Care for exam and further assessment. CXR obtained and negative. She was given cough syrup. Patient note noting over the past several days of having increased congestion with sputum changing color, increased hoarseness and fatigue. Denies fever, chills, chest pain or SOB. 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   MDD (major depressive disorder), recurrent, in partial remission ([place]) 10/02/2020   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   Insomnia due to  mental disorder 09/01/2020   Attention and concentration deficit 08/31/2020   Moderate episode of recurrent major depressive disorder ([place]) 06/29/2020   High risk medication use 06/29/2020   History of ADHD 06/29/2020   Uterine scar from previous cesarean delivery affecting pregnancy 12/31/2018   Anti-D antibodies present during pregnancy 08-01-2018   BRCA gene mutation negative 05/04/2018    History of cesarean delivery 02/10/2018   Rh negative state in antepartum period 08/23/2017   History of eclampsia 07/22/2017   Migraines 04/09/2017   GAD (generalized anxiety disorder) 12/01/2014    Allergies:  Allergies  Allergen Reactions   Fish Allergy Shortness Of Breath   Shellfish Allergy Shortness Of Breath   Medications:  Current Outpatient Medications:    doxycycline (VIBRA-TABS) 100 MG tablet, Take 1 tablet (100 mg total) by mouth 2 (two) times daily., Disp: 14 tablet, Rfl: 0   predniSONE (DELTASONE) 20 MG tablet, Take 2 tablets (40 mg total) by mouth daily with breakfast., Disp: 10 tablet, Rfl: 0   albuterol (PROVENTIL) (2.5 MG/3ML) 0.083% nebulizer solution, Take 3 mLs (2.5 mg total) by nebulization every 6 (six) hours as needed for wheezing or shortness of breath., Disp: 75 mL, Rfl: 0   amphetamine-dextroamphetamine (ADDERALL) 20 MG tablet, Take 0.5 tablets (10 mg total) by mouth 2 (two) times daily. Take half tablet daily at 7 AM and half tablet daily at 1 PM, Disp: 30 tablet, Rfl: 0   ARIPiprazole (ABILIFY) 5 MG tablet, Take 0.5 tablets (2.5 mg total) by mouth daily. Dose change, Disp: 30 tablet, Rfl: 1   benzonatate (TESSALON) 100 MG capsule, Take 1 capsule (100 mg total) by mouth 2 (two) times daily as needed for cough., Disp: 20 capsule, Rfl: 0   benztropine (COGENTIN) 0.5 MG tablet, TAKE 1 TABLET (0.5 MG TOTAL) BY MOUTH DAILY AS NEEDED FOR TREMORS., Disp: 30 tablet, Rfl: 1   Butalbital-APAP-Caffeine 50-325-40 MG capsule, Take by mouth., Disp: , Rfl:    citalopram (CELEXA) 10 MG tablet, TAKE 1 TABLET BY MOUTH EVERY DAY, Disp: 90 tablet, Rfl: 0   clotrimazole-betamethasone (LOTRISONE) cream, Apply topically 2 (two) times daily., Disp: , Rfl:    cyclobenzaprine (FLEXERIL) 10 MG tablet, Take 1 tablet by mouth as needed., Disp: , Rfl:    guaiFENesin-codeine (CHERATUSSIN AC) 100-10 MG/5ML syrup, Take 10 mLs by mouth 3 (three) times daily as needed for cough., Disp: 118 mL, Rfl:  0   hydrOXYzine (ATARAX/VISTARIL) 25 MG tablet, TAKE 1-2 TABLETS (25-50 MG TOTAL) BY MOUTH DAILY AS NEEDED FOR ANXIETY., Disp: 180 tablet, Rfl: 0   lamoTRIgine (LAMICTAL) 200 MG tablet, Take 1 tablet (200 mg total) by mouth daily., Disp: 90 tablet, Rfl: 0   lamoTRIgine (LAMICTAL) 25 MG tablet, TAKE 2 TABLETS (50 MG TOTAL) BY MOUTH DAILY. TAKE ALONG WITH 200 MG DAILY - TOTAL OF 250 MG DAILY, Disp: 180 tablet, Rfl: 0   levonorgestrel (MIRENA) 20 MCG/24HR IUD, by Intrauterine route., Disp: , Rfl:    melatonin 3 MG TABS tablet, Take 1 tablet (3 mg total) by mouth at bedtime., Disp: 30 tablet, Rfl: 0   naproxen (NAPROSYN) 500 MG tablet, 1 po bid prn, Disp: , Rfl:    ondansetron (ZOFRAN) 4 MG tablet, Take 1 tablet (4 mg total) by mouth every 8 (eight) hours as needed for nausea or vomiting., Disp: 20 tablet, Rfl: 0  Observations/Objective: Patient is well-developed, well-nourished in no acute distress.  Resting comfortably at home.  Head is normocephalic, atraumatic.  No labored breathing. Speech is clear and  coherent with logical content.  Patient is alert and oriented at baseline.   Assessment and Plan: 1. Acute laryngitis - predniSONE (DELTASONE) 20 MG tablet; Take 2 tablets (40 mg total) by mouth daily with breakfast.  Dispense: 10 tablet; Refill: 0  2. Bronchitis with bronchospasm - predniSONE (DELTASONE) 20 MG tablet; Take 2 tablets (40 mg total) by mouth daily with breakfast.  Dispense: 10 tablet; Refill: 0 - doxycycline (VIBRA-TABS) 100 MG tablet; Take 1 tablet (100 mg total) by mouth 2 (two) times daily.  Dispense: 14 tablet; Refill: 0  Concern for festering/not fully treated bronchitis. Will continue supportive measures. OTC medications reviewed. Rx 5-day burst of prednisone along with Doxycycline. PCP follow-up discussed.   Follow Up Instructions: I discussed the assessment and treatment plan with the patient. The patient was provided an opportunity to ask questions and all were  answered. The patient agreed with the plan and demonstrated an understanding of the instructions.  A copy of instructions were sent to the patient via MyChart unless otherwise noted below.   The patient was advised to call back or seek an in-person evaluation if the symptoms worsen or if the condition fails to improve as anticipated.  Time:  I spent 15 minutes with the patient via telehealth technology discussing the above problems/concerns.    Denise Rio, PA-C

## 2021-01-30 NOTE — Patient Instructions (Signed)
[name], thank you for joining [name], PA-C for today's virtual visit.  While this provider is not your primary care provider (PCP), if your PCP is located in our provider database this encounter information will be shared with them immediately following your visit.  Consent: (Patient) Denise Walker provided verbal consent for this virtual visit at the beginning of the encounter.  Current Medications:  Current Outpatient Medications:    albuterol (PROVENTIL) (2.5 MG/3ML) 0.083% nebulizer solution, Take 3 mLs (2.5 mg total) by nebulization every 6 (six) hours as needed for wheezing or shortness of breath., Disp: 75 mL, Rfl: 0   amphetamine-dextroamphetamine (ADDERALL) 20 MG tablet, Take 0.5 tablets (10 mg total) by mouth 2 (two) times daily. Take half tablet daily at 7 AM and half tablet daily at 1 PM, Disp: 30 tablet, Rfl: 0   ARIPiprazole (ABILIFY) 5 MG tablet, Take 0.5 tablets (2.5 mg total) by mouth daily. Dose change, Disp: 30 tablet, Rfl: 1   benzonatate (TESSALON) 100 MG capsule, Take 1 capsule (100 mg total) by mouth 2 (two) times daily as needed for cough., Disp: 20 capsule, Rfl: 0   benztropine (COGENTIN) 0.5 MG tablet, TAKE 1 TABLET (0.5 MG TOTAL) BY MOUTH DAILY AS NEEDED FOR TREMORS., Disp: 30 tablet, Rfl: 1   Butalbital-APAP-Caffeine 50-325-40 MG capsule, Take by mouth., Disp: , Rfl:    citalopram (CELEXA) 10 MG tablet, TAKE 1 TABLET BY MOUTH EVERY DAY, Disp: 90 tablet, Rfl: 0   clotrimazole-betamethasone (LOTRISONE) cream, Apply topically 2 (two) times daily., Disp: , Rfl:    cyclobenzaprine (FLEXERIL) 10 MG tablet, Take 1 tablet by mouth as needed., Disp: , Rfl:    guaiFENesin-codeine (CHERATUSSIN AC) 100-10 MG/5ML syrup, Take 10 mLs by mouth 3 (three) times daily as needed for cough., Disp: 118 mL, Rfl: 0   hydrOXYzine (ATARAX/VISTARIL) 25 MG tablet, TAKE 1-2 TABLETS (25-50 MG TOTAL) BY MOUTH DAILY AS NEEDED FOR ANXIETY., Disp: 180 tablet, Rfl: 0   lamoTRIgine  (LAMICTAL) 200 MG tablet, Take 1 tablet (200 mg total) by mouth daily., Disp: 90 tablet, Rfl: 0   lamoTRIgine (LAMICTAL) 25 MG tablet, TAKE 2 TABLETS (50 MG TOTAL) BY MOUTH DAILY. TAKE ALONG WITH 200 MG DAILY - TOTAL OF 250 MG DAILY, Disp: 180 tablet, Rfl: 0   levonorgestrel (MIRENA) 20 MCG/24HR IUD, by Intrauterine route., Disp: , Rfl:    melatonin 3 MG TABS tablet, Take 1 tablet (3 mg total) by mouth at bedtime., Disp: 30 tablet, Rfl: 0   naproxen (NAPROSYN) 500 MG tablet, 1 po bid prn, Disp: , Rfl:    ondansetron (ZOFRAN) 4 MG tablet, Take 1 tablet (4 mg total) by mouth every 8 (eight) hours as needed for nausea or vomiting., Disp: 20 tablet, Rfl: 0   predniSONE (STERAPRED UNI-PAK 21 TAB) 10 MG (21) TBPK tablet, PO: Take 6 tablets on day 1:Take 5 tablets day 2:Take 4 tablets day 3: Take 3 tablets day 4:Take 2 tablets day five: 5 Take 1 tablet day 6, Disp: 21 tablet, Rfl: 0   Medications ordered in this encounter:  No orders of the defined types were placed in this encounter.    *If you need refills on other medications prior to your next appointment, please contact your pharmacy*  Follow-Up: Call back or seek an in-person evaluation if the symptoms worsen or if the condition fails to improve as anticipated.  Other Instructions Take antibiotic (Doxycycline) as directed.  Increase fluids.  Get plenty of rest. Use Mucinex for congestion. Take  the steroid as directed. Take a daily probiotic (I recommend Align or Culturelle, but even Activia Yogurt may be beneficial).  A humidifier placed in the bedroom may offer some relief for a dry, scratchy throat of nasal irritation.  Read information below on acute bronchitis. Please call or return to clinic if symptoms are not improving.  Acute Bronchitis Bronchitis is when the airways that extend from the windpipe into the lungs get red, puffy, and painful (inflamed). Bronchitis often causes thick spit (mucus) to develop. This leads to a cough. A cough is  the most common symptom of bronchitis. In acute bronchitis, the condition usually begins suddenly and goes away over time (usually in 2 weeks). Smoking, allergies, and asthma can make bronchitis worse. Repeated episodes of bronchitis may cause more lung problems.  HOME CARE Rest. Drink enough fluids to keep your pee (urine) clear or pale yellow (unless you need to limit fluids as told by your doctor). Only take over-the-counter or prescription medicines as told by your doctor. Avoid smoking and secondhand smoke. These can make bronchitis worse. If you are a smoker, think about using nicotine gum or skin patches. Quitting smoking will help your lungs heal faster. Reduce the chance of getting bronchitis again by: Washing your hands often. Avoiding people with cold symptoms. Trying not to touch your hands to your mouth, nose, or eyes. Follow up with your doctor as told.  GET HELP IF: Your symptoms do not improve after 1 week of treatment. Symptoms include: Cough. Fever. Coughing up thick spit. Body aches. Chest congestion. Chills. Shortness of breath. Sore throat.  GET HELP RIGHT AWAY IF:  You have an increased fever. You have chills. You have severe shortness of breath. You have bloody thick spit (sputum). You throw up (vomit) often. You lose too much body fluid (dehydration). You have a severe headache. You faint.  MAKE SURE YOU:  Understand these instructions. Will watch your condition. Will get help right away if you are not doing well or get worse. Document Released: 07/17/2007 Document Revised: 09/30/2012 Document Reviewed: 07/21/2012 [facility] Patient Information 2015 [place], [place]. This information is not intended to replace advice given to you by your health care provider. Make sure you discuss any questions you have with your health care provider.    If you have been instructed to have an in-person evaluation today at a local Urgent Care facility, please use the  link below. It will take you to a list of all of our available [facility], including address, phone number and hours of operation. Please do not delay care.  [facility]  If you or a family member do not have a primary care provider, use the link below to schedule a visit and establish care. When you choose a [facility] primary care physician or advanced practice provider, you gain a long-term partner in health. Find a Primary Care Provider  Learn more about [facility] in-office and virtual care options: [facility] - Get Care Now

## 2021-01-31 ENCOUNTER — Telehealth: Payer: Self-pay

## 2021-01-31 DIAGNOSIS — F3342 Major depressive disorder, recurrent, in full remission: Secondary | ICD-10-CM

## 2021-01-31 DIAGNOSIS — F331 Major depressive disorder, recurrent, moderate: Secondary | ICD-10-CM

## 2021-01-31 DIAGNOSIS — F411 Generalized anxiety disorder: Secondary | ICD-10-CM

## 2021-01-31 DIAGNOSIS — T50905A Adverse effect of unspecified drugs, medicaments and biological substances, initial encounter: Secondary | ICD-10-CM

## 2021-01-31 MED ORDER — LAMOTRIGINE 200 MG PO TABS: 200.0000 mg | ORAL_TABLET | Freq: Every day | ORAL | 0 refills | Status: DC

## 2021-01-31 MED ORDER — LAMOTRIGINE 25 MG PO TABS: 50.0000 mg | ORAL_TABLET | Freq: Every day | ORAL | 0 refills | Status: DC

## 2021-01-31 MED ORDER — BENZTROPINE MESYLATE 0.5 MG PO TABS: 0.5000 mg | ORAL_TABLET | Freq: Every day | ORAL | 1 refills | Status: DC | PRN

## 2021-01-31 MED ORDER — ARIPIPRAZOLE 5 MG PO TABS: 2.5000 mg | ORAL_TABLET | Freq: Every day | ORAL | 1 refills | Status: DC

## 2021-01-31 MED ORDER — CITALOPRAM HYDROBROMIDE 10 MG PO TABS: 10.0000 mg | ORAL_TABLET | Freq: Every day | ORAL | 0 refills | Status: DC

## 2021-01-31 MED ORDER — HYDROXYZINE HCL 25 MG PO TABS: 25.0000 mg | ORAL_TABLET | Freq: Every day | ORAL | 0 refills | Status: DC | PRN

## 2021-01-31 NOTE — Telephone Encounter (Signed)
I have sent all her medications except Adderall to [facility]

## 2021-01-31 NOTE — Telephone Encounter (Signed)
Medication refill - Telephone call with pt to follow up on her request to see if Dr. Elna Breslow would refill her medications one time.  Pt. reported she has continued to be sick and could not come into appt 02/01/21 and does not have access to do virtual appt.  Patient reported she rescheduled to the first available appt on 02/26/21 and stated she needed all medications except Adderall as she has not been using it lately.  Agreed to send request to Dr. Elna Breslow as patient reported she will be running out of Lamictal and wanted to make sure she has that medication.

## 2021-02-01 ENCOUNTER — Encounter: Payer: 59 | Admitting: Psychology

## 2021-02-01 ENCOUNTER — Ambulatory Visit: Payer: Medicaid Other | Admitting: Psychiatry

## 2021-02-16 ENCOUNTER — Other Ambulatory Visit: Payer: Self-pay

## 2021-02-16 ENCOUNTER — Ambulatory Visit (INDEPENDENT_AMBULATORY_CARE_PROVIDER_SITE_OTHER): Payer: Medicaid Other | Admitting: Licensed Clinical Social Worker

## 2021-02-16 DIAGNOSIS — F411 Generalized anxiety disorder: Secondary | ICD-10-CM | POA: Diagnosis not present

## 2021-02-16 DIAGNOSIS — F331 Major depressive disorder, recurrent, moderate: Secondary | ICD-10-CM | POA: Diagnosis not present

## 2021-02-16 NOTE — Progress Notes (Addendum)
Virtual Visit via Video Note  I connected with [name] on 02/16/21 at 10:00 AM EST by a video enabled telemedicine application and verified that I am speaking with the correct person using two identifiers.  Location: Patient: home Provider: remote office [place], [place])   I discussed the limitations of evaluation and management by telemedicine and the availability of in person appointments. The patient expressed understanding and agreed to proceed.   I discussed the assessment and treatment plan with the patient. The patient was provided an opportunity to ask questions and all were answered. The patient agreed with the plan and demonstrated an understanding of the instructions.   The patient was advised to call back or seek an in-person evaluation if the symptoms worsen or if the condition fails to improve as anticipated.  I provided 60 minutes of non-face-to-face time during this encounter.   Sanjana Folz R Safwan Tomei, LCSW   THERAPIST PROGRESS NOTE  Session Time: 10-11a  Participation Level: Active  Behavioral Response: Neat and Well GroomedAlertEuthymic  Type of Therapy: Individual Therapy  Treatment Goals addressed:  Problem: Depression CCP Problem  1 Decrease depressive symptoms and improve levels of effective functioning  Goal: LTG: Reduce frequency, intensity, and duration of depression symptoms as evidenced by: SSB input needed on appropriate metric Outcome: Progressing  Goal: STG: @PREFFIRSTNAME @ will participate in at least 80% of scheduled individual psychotherapy sessions Outcome: Progressing  Interventions:  Intervention: Work with @PREFFIRSTNAME @ to track symptoms, triggers and/or skill use through a mood chart, diary card, or journal  Intervention: Work with patient to identify the major components of a recent episode of anxiety: physical symptoms, major thoughts and images, and major behaviors they experienced  Intervention: REVIEW PLEASE SKILLS (TREAT  PHYSICAL ILLNESS, BALANCE EATING, AVOID MOOD-ALTERING SUBSTANCES, BALANCE SLEEP AND GET EXERCISE) WITH Intervention: Assist with coping skills and behavior   Summary: Minta Fair is a 31 y.o. female who presents with improving symptoms related to depression and anxiety diagnoses. Pt reporting that the overall intensity of depression and anxiety symptoms have decreased since last session. Pt reports that overall mood is stable and that she is managing stress and anxiety symptoms well. Patient reports improving quality and quantity of sleep.   Allowed patient safe space to explore and express thoughts and feelings associated with recent external stressors and life events. Pt reports more anxiety triggered by health-related concerns involving her daughter. Pt reports that she has ongoing stress associated with recent illnesses that have gone through the whole family--flu, bronchitis. Pt reports that everyone was well enough to participate in holiday events, which was a stress reliever for pt. Discussed concerns associated with daughter's health:  feeding issues, and daughter participating in feeding therapy. Pt is happy with progress.  Explored psychological impact of pts chronic pain on everyday activities. Pt reports that she has an upcoming epidural appt. Discussed relationships with family members.  Focused on emotion regulation, managing health anxiety stress, conflict resolution with family members, managing chronic pain, setting boundaries with friends/family member. Pt reflects understanding.  Pt reports upcoming neuropsychological testing for ADHD or PTSD. Encouraged pt to keep appt since they are hard to reschedule and stay booked up for months.   Discussed briefly death of mother at age 56 and how that has impacted pt throughout adolescent years.  Continued recommendations are as follows: self care behaviors, positive social engagements, focusing on overall work/home/life balance, and  focusing on positive physical and emotional wellness.  .   Suicidal/Homicidal: No  Therapist Response: Pt  is continuing to apply interventions learned in session into daily life situations. Pt is currently on track to meet goals utilizing interventions mentioned above. Personal growth and progress noted. Treatment to continue as indicated.   Plan: Return again in 4 weeks.  Diagnosis: Axis I: MDD, recurrent, remission; GAD    Axis II: No diagnosis    Ernest Haber Malikah Lakey, LCSW 02/16/2021

## 2021-02-19 NOTE — Plan of Care (Signed)
°  Problem: Depression CCP Problem  1 Decrease depressive symptoms and improve levels of effective functioning Goal: LTG: Reduce frequency, intensity, and duration of depression symptoms as evidenced by: SSB input needed on appropriate metric Outcome: Progressing Goal: STG: @PREFFIRSTNAME @ will participate in at least 80% of scheduled individual psychotherapy sessions Outcome: Progressing Intervention: Work with @PREFFIRSTNAME @ to track symptoms, triggers and/or skill use through a mood chart, diary card, or journal Intervention: Work with patient to identify the major components of a recent episode of anxiety: physical symptoms, major thoughts and images, and major behaviors they experienced Intervention: REVIEW PLEASE SKILLS (TREAT PHYSICAL ILLNESS, BALANCE EATING, AVOID MOOD-ALTERING SUBSTANCES, BALANCE SLEEP AND GET EXERCISE) WITH Intervention: Assist with coping skills and behavior

## 2021-02-26 ENCOUNTER — Ambulatory Visit: Payer: Medicaid Other | Admitting: Psychiatry

## 2021-03-02 ENCOUNTER — Other Ambulatory Visit: Payer: Self-pay | Admitting: Psychiatry

## 2021-03-02 DIAGNOSIS — T50905A Adverse effect of unspecified drugs, medicaments and biological substances, initial encounter: Secondary | ICD-10-CM

## 2021-03-02 DIAGNOSIS — G2589 Other specified extrapyramidal and movement disorders: Secondary | ICD-10-CM

## 2021-03-06 ENCOUNTER — Encounter: Payer: Self-pay | Admitting: Psychiatry

## 2021-03-06 ENCOUNTER — Other Ambulatory Visit: Payer: Self-pay

## 2021-03-06 ENCOUNTER — Ambulatory Visit (INDEPENDENT_AMBULATORY_CARE_PROVIDER_SITE_OTHER): Payer: 59 | Admitting: Psychiatry

## 2021-03-06 VITALS — BP 138/87 | HR 99 | Temp 98.8°F | Wt 211.2 lb

## 2021-03-06 DIAGNOSIS — F331 Major depressive disorder, recurrent, moderate: Secondary | ICD-10-CM | POA: Diagnosis not present

## 2021-03-06 DIAGNOSIS — F4312 Post-traumatic stress disorder, chronic: Secondary | ICD-10-CM

## 2021-03-06 DIAGNOSIS — F900 Attention-deficit hyperactivity disorder, predominantly inattentive type: Secondary | ICD-10-CM | POA: Diagnosis not present

## 2021-03-06 DIAGNOSIS — F411 Generalized anxiety disorder: Secondary | ICD-10-CM | POA: Diagnosis not present

## 2021-03-06 DIAGNOSIS — F5105 Insomnia due to other mental disorder: Secondary | ICD-10-CM | POA: Diagnosis not present

## 2021-03-06 MED ORDER — ARIPIPRAZOLE 5 MG PO TABS: 2.5000 mg | ORAL_TABLET | ORAL | 0 refills | Status: DC

## 2021-03-06 MED ORDER — AMPHETAMINE-DEXTROAMPHETAMINE 30 MG PO TABS: 15.0000 mg | ORAL_TABLET | Freq: Two times a day (BID) | ORAL | 0 refills | Status: DC

## 2021-03-06 NOTE — Progress Notes (Signed)
[facility] MD OP Progress Note  03/06/2021 3:23 PM Denise Walker  MRN:  [id number]  Chief Complaint:  Chief Complaint   Follow-up, 31 year old Caucasian female with history of MDD, GAD, ADHD, with multiple psychosocial stressors including medical illness of her child presents for medication management.    HPI: Denise Walker is a 31 year old Caucasian female, stay-at-home mom, lives in [facility], has a history of MDD, GAD, history of ADHD was evaluated in office today.  Patient today reports her youngest child currently has multiple medical problems going on and she is currently following up with specialist.  She reports she has to be with her 24/7 to keep her safe and that is overwhelming for her.  She however reports she does have a support group available where she can talk to other mothers who have similar problems as well as continues to work with her therapist and that has been beneficial.  She is currently coping with her anxiety better than she has in the past.  She reports she is compliant on all her medications.  Denies side effects.  She is currently compliant on Adderall, reports concentration as improved and she has not noticed any side effects.  She also reports she feels more energetic to do things around the house.  She is interested in dosage increase.  Patient denies any suicidality, homicidality or perceptual disturbances.  Agreeable to taper off Abilify now that depression symptoms are stable.  Patient denies any other concerns today.  Visit Diagnosis:    ICD-10-CM   1. Moderate episode of recurrent major depressive disorder (HCC)  F33.1     2. GAD (generalized anxiety disorder)  F41.1 ARIPiprazole (ABILIFY) 5 MG tablet    3. Insomnia due to mental disorder  F51.05    mood    4. ADHD (attention deficit hyperactivity disorder), inattentive type  F90.0 amphetamine-dextroamphetamine (ADDERALL) 30 MG tablet    5. Chronic post-traumatic stress disorder (PTSD)  F43.12        Past Psychiatric History: Reviewed past psychiatric history from progress note on 06/29/2020.  Past trials of medications like Lexapro, Zoloft, Celexa, Trintellix, Concerta, Ritalin, Wellbutrin.  Neuropsychological testing completed-11/08/2020-diagnosed with ADHD.    Past Medical History:  Past Medical History:  Diagnosis Date   Anxiety    Back pain    low back pain with lumbar radiculitis   Bilateral shoulder pain    BRCA negative 04/2018   MyRisk neg; IBIS=8.9%/riskscore=6.7%   Eclampsia    Family history of ovarian cancer    Gestational diabetes    Migraine headache    PCOS (polycystic ovarian syndrome)     Past Surgical History:  Procedure Laterality Date   CESAREAN SECTION N/A 07/15/2014   Procedure: CESAREAN SECTION;  Surgeon: Gae Dry, MD;  Location: [facility] ORS;  Service: Obstetrics;  Laterality: N/A;   CESAREAN SECTION N/A 02/10/2018   Procedure: CESAREAN SECTION;  Surgeon: Will Bonnet, MD;  Location: [facility] ORS;  Service: Obstetrics;  Laterality: N/A;   CESAREAN SECTION N/A 12/31/2018   Procedure: CESAREAN SECTION;  Surgeon: [name], MD;  Location: [facility] ORS;  Service: Obstetrics;  Laterality: N/A;   CESAREAN SECTION     HERNIA REPAIR  10/2013   WISDOM TOOTH EXTRACTION      Family Psychiatric History: Reviewed family psychiatric history from progress note on 06/29/2020.  Family History:  Family History  Problem Relation Age of Onset   Bipolar disorder Mother    Ovarian cancer Mother    Arthritis Mother  Ovarian cancer Maternal Aunt    Diabetes Paternal Aunt    Breast cancer Maternal Grandmother    Diabetes Paternal Grandfather    Uterine cancer Cousin    ADD / ADHD Daughter    ODD Daughter     Social History: Reviewed social history from progress note on 06/29/2020. Social History   Socioeconomic History   Marital status: Married    Spouse name: Einar Pheasant   Number of children: 3   Years of education: high school   Highest education  level: Not on file  Occupational History   Not on file  Tobacco Use   Smoking status: Never   Smokeless tobacco: Never  Vaping Use   Vaping Use: Never used  Substance and Sexual Activity   Alcohol use: Yes    Comment: occasional wine cooler   Drug use: Never   Sexual activity: Yes    Birth control/protection: I.U.D.    Comment: will discuss with MD  Other Topics Concern   Not on file  Social History Narrative   Not on file   Social Determinants of Health   Financial Resource Strain: Not on file  Food Insecurity: Not on file  Transportation Needs: Not on file  Physical Activity: Not on file  Stress: Not on file  Social Connections: Not on file    Allergies:  Allergies  Allergen Reactions   Fish Allergy Shortness Of Breath   Shellfish Allergy Shortness Of Breath    Metabolic Disorder Labs: Lab Results  Component Value Date   HGBA1C 5.5 06/08/2020   Lab Results  Component Value Date   PROLACTIN 8.8 03/27/2017   Lab Results  Component Value Date   CHOL 175 06/08/2020   TRIG 189 (H) 06/08/2020   HDL 47 06/08/2020   CHOLHDL 3.7 06/08/2020   LDLCALC 96 06/08/2020   Lab Results  Component Value Date   TSH 4.350 09/22/2019   TSH 2.170 03/27/2017    Therapeutic Level Labs: No results found for: LITHIUM No results found for: VALPROATE No components found for:  CBMZ  Current Medications: Current Outpatient Medications  Medication Sig Dispense Refill   albuterol (PROVENTIL) (2.5 MG/3ML) 0.083% nebulizer solution Take 3 mLs (2.5 mg total) by nebulization every 6 (six) hours as needed for wheezing or shortness of breath. 75 mL 0   amphetamine-dextroamphetamine (ADDERALL) 30 MG tablet Take 0.5 tablets by mouth 2 (two) times daily. Take 0.5 tablets by mouth at 7 AM and 1 PM 30 tablet 0   benzonatate (TESSALON) 100 MG capsule Take 1 capsule (100 mg total) by mouth 2 (two) times daily as needed for cough. 20 capsule 0   benztropine (COGENTIN) 0.5 MG tablet TAKE 1  TABLET (0.5 MG TOTAL) BY MOUTH DAILY AS NEEDED FOR TREMORS. 30 tablet 1   Butalbital-APAP-Caffeine 50-325-40 MG capsule Take by mouth.     citalopram (CELEXA) 10 MG tablet Take 1 tablet (10 mg total) by mouth daily. 90 tablet 0   clotrimazole-betamethasone (LOTRISONE) cream Apply topically 2 (two) times daily.     cyclobenzaprine (FLEXERIL) 10 MG tablet Take 1 tablet by mouth as needed.     hydrOXYzine (ATARAX) 25 MG tablet Take 1-2 tablets (25-50 mg total) by mouth daily as needed for anxiety. 180 tablet 0   lamoTRIgine (LAMICTAL) 200 MG tablet Take 1 tablet (200 mg total) by mouth daily. 90 tablet 0   lamoTRIgine (LAMICTAL) 25 MG tablet Take 2 tablets (50 mg total) by mouth daily. Take along with 200 mg daily -  total of 250 mg daily 180 tablet 0   levonorgestrel (MIRENA) 20 MCG/24HR IUD by Intrauterine route.     melatonin 3 MG TABS tablet Take 1 tablet (3 mg total) by mouth at bedtime. 30 tablet 0   naproxen (NAPROSYN) 500 MG tablet 1 po bid prn     ARIPiprazole (ABILIFY) 5 MG tablet Take 0.5 tablets (2.5 mg total) by mouth as directed. Take 2.5 mg every other day for 10 days and stop 15 tablet 0   No current facility-administered medications for this visit.     Musculoskeletal: Strength & Muscle Tone: within normal limits Gait & Station: normal Patient leans: N/A  Psychiatric Specialty Exam: Review of Systems  Psychiatric/Behavioral:  Positive for decreased concentration. The patient is nervous/anxious.   All other systems reviewed and are negative.  Blood pressure 138/87, pulse 99, temperature 98.8 F (37.1 C), temperature source Temporal, weight 211 lb 3.2 oz (95.8 kg).Body mass index is 42.66 kg/m.  General Appearance: Casual  Eye Contact:  Fair  Speech:  Clear and Coherent  Volume:  Normal  Mood:  Anxious  Affect:  Congruent  Thought Process:  Goal Directed and Descriptions of Associations: Intact  Orientation:  Full (Time, Place, and Person)  Thought Content: Logical    Suicidal Thoughts:  No  Homicidal Thoughts:  No  Memory:  Immediate;   Fair Recent;   Fair Remote;   Fair  Judgement:  Fair  Insight:  Fair  Psychomotor Activity:  Normal  Concentration:  Concentration: Fair and Attention Span: Fair  Recall:  [employer] of Knowledge: Fair  Language: Fair  Akathisia:  No  Handed:  Left  AIMS (if indicated): done, 0  Assets:  Communication Skills Desire for Improvement Housing Intimacy Social Support Talents/Skills  ADL's:  Intact  Cognition: WNL  Sleep:  Fair   Screenings: AIMS    Flowsheet Row Video Visit from 10/02/2020 in [facility] Total Score 2      [place] Office Visit from 09/07/2020 in [facility] Video Visit from 08/31/2020 in [facility] Video Visit from 06/29/2020 in [place] Visit from 06/08/2020 in [facility] Office Visit from 05/04/2020 in [employer]  Total GAD-7 Score _0 PHQ2-9    [place] Visit from 03/06/2021 in [facility] from 11/23/2020 in [facility] from 09/22/2020 in [facility] Visit from 09/07/2020 in [place] [facility] Video Visit from 08/31/2020 in [facility]  PHQ-2 Total Score _1 PHQ-9 Total Score 7 11 -- 11 13      Flowsheet Row Counselor from 02/16/2021 in [facility] from 01/24/2021 in [facility] from 01/03/2021 in [facility] No Risk No Risk No Risk        Assessment and Plan: Denise Walker is a 31 year old Caucasian female, lives in [place], married, has a history of depression, anxiety, PCOS, chronic pain was evaluated in office today.  Patient is  currently improving although she continues to have concentration problems as well as anxiety.  Continue medication management as well as psychotherapy sessions.  Plan as noted below.  Plan  MDD in remission Taper of Abilify 2.5 mg , advised to take it every other  day for the next 10 days and stop taking it. Celexa 10 mg p.o. daily Lamotrigine 250 mg p.o. daily  GAD-stable Celexa 10 mg p.o. daily Hydroxyzine 25-50 mg p.o. daily as needed for anxiety Klonopin 0.5 mg as needed for severe panic attacks Continue CBT with Ms. Christina Hussami  ADHD-improving Increase Adderall to 30 mg p.o. daily in divided dosage. Reviewed [place] PMP aware  Chronic PTSD-stable Continue CBT  Follow-up in clinic in 4 to 5 weeks or sooner if needed.  This note was generated in part or whole with voice recognition software. Voice recognition is usually quite accurate but there are transcription errors that can and very often do occur. I apologize for any typographical errors that were not detected and corrected.      Ursula Alert, MD 03/07/2021, 8:30 AM

## 2021-03-20 ENCOUNTER — Ambulatory Visit: Payer: 59 | Admitting: Psychology

## 2021-03-23 ENCOUNTER — Ambulatory Visit (INDEPENDENT_AMBULATORY_CARE_PROVIDER_SITE_OTHER): Payer: 59 | Admitting: Licensed Clinical Social Worker

## 2021-03-23 ENCOUNTER — Other Ambulatory Visit: Payer: Self-pay | Admitting: Psychiatry

## 2021-03-23 ENCOUNTER — Other Ambulatory Visit: Payer: Self-pay

## 2021-03-23 DIAGNOSIS — R69 Illness, unspecified: Secondary | ICD-10-CM | POA: Diagnosis not present

## 2021-03-23 DIAGNOSIS — F331 Major depressive disorder, recurrent, moderate: Secondary | ICD-10-CM | POA: Diagnosis not present

## 2021-03-23 DIAGNOSIS — F411 Generalized anxiety disorder: Secondary | ICD-10-CM | POA: Diagnosis not present

## 2021-03-23 NOTE — Progress Notes (Signed)
Virtual Visit via Video Note  I connected with Denise Walker on 03/23/21 at 11:00 AM EST by a video enabled telemedicine application and verified that I am speaking with the correct person using two identifiers.  Location: Patient: home Provider: remote office [place], [place])   I discussed the limitations of evaluation and management by telemedicine and the availability of in person appointments. The patient expressed understanding and agreed to proceed.   I discussed the assessment and treatment plan with the patient. The patient was provided an opportunity to ask questions and all were answered. The patient agreed with the plan and demonstrated an understanding of the instructions.   The patient was advised to call back or seek an in-person evaluation if the symptoms worsen or if the condition fails to improve as anticipated.  I provided 60 minutes of non-face-to-face time during this encounter.   Denise Stach R Aspynn Clover, LCSW   THERAPIST PROGRESS NOTE  Session Time: 10-11a  Participation Level: Active  Behavioral Response: Neat and Well GroomedAlertEuthymic  Type of Therapy: Individual Therapy  Treatment Goals addressed:  Problem: Depression CCP Problem  1 Decrease depressive symptoms and improve levels of effective functioning  Goal: LTG: Reduce frequency, intensity, and duration of depression symptoms as evidenced by: SSB input needed on appropriate metric Outcome: Progressing  Goal: STG: @PREFFIRSTNAME @ will participate in at least 80% of scheduled individual psychotherapy sessions Outcome: Progressing  Interventions:   Summary: Denise Walker is a 31 y.o. female who presents with improving symptoms related to depression and anxiety diagnoses. Pt reporting that the overall intensity of depression and anxiety symptoms have decreased since last session. Pt reports that overall mood is stable and that she is managing stress and anxiety symptoms well. Patient reports improving  quality and quantity of sleep.   Allowed patient safe space to explore and express thoughts and feelings associated with recent external stressors and life events. Pt reports that she recently found out that her daughter was diagnosed with a rare medical condition that required more testing, assessments, and appointments with specialists. Pt reports that she is happy her daughter is getting treatment, but pt is worrying about long term consequences of health issues. Encouraged pt to stay focused on present--reviewed using mindfulness to help manage racing thoughts and anxiety/worries. Discussed pt making more time for herself to help manage her own stress and anxiety.   Focused on emotion regulation, managing health anxiety stress, conflict resolution with family members, managing chronic pain, setting boundaries with friends/family member. Pt reflects understanding.  Continued recommendations are as follows: self care behaviors, positive social engagements, focusing on overall work/home/life balance, and focusing on positive physical and emotional wellness.  .   Suicidal/Homicidal: No  Therapist Response: Pt is continuing to apply interventions learned in session into daily life situations. Pt is currently on track to meet goals utilizing interventions mentioned above. Personal growth and progress noted. Treatment to continue as indicated.   Plan: Return again in 4 weeks.  Diagnosis: Axis I: MDD, recurrent, remission; GAD    Axis II: No diagnosis    32 Denise Brott, LCSW 03/23/2021

## 2021-03-24 NOTE — Plan of Care (Signed)
°  Problem: Depression CCP Problem  1 Decrease depressive symptoms and improve levels of effective functioning Goal: LTG: Reduce frequency, intensity, and duration of depression symptoms as evidenced by: SSB input needed on appropriate metric Outcome: Progressing Goal: STG: @PREFFIRSTNAME @ will participate in at least 80% of scheduled individual psychotherapy sessions Outcome: Progressing Intervention: Assist with coping skills and behavior Intervention: Encourage patient to identify triggers Intervention: Encourage verbalization of feelings/concerns/expectations Intervention: Encourage self-care activities

## 2021-03-29 ENCOUNTER — Telehealth: Payer: 59 | Admitting: Physician Assistant

## 2021-03-29 DIAGNOSIS — J0100 Acute maxillary sinusitis, unspecified: Secondary | ICD-10-CM

## 2021-03-29 MED ORDER — AMOXICILLIN-POT CLAVULANATE 875-125 MG PO TABS: 1.0000 | ORAL_TABLET | Freq: Two times a day (BID) | ORAL | 0 refills | Status: DC

## 2021-03-29 NOTE — Patient Instructions (Signed)
Denise Walker, thank you for joining Denise Climes, PA-C for today's virtual visit.  While this provider is not your primary care provider (PCP), if your PCP is located in our provider database this encounter information will be shared with them immediately following your visit.  Consent: (Patient) Denise Walker provided verbal consent for this virtual visit at the beginning of the encounter.  Current Medications:  Current Outpatient Medications:    albuterol (PROVENTIL) (2.5 MG/3ML) 0.083% nebulizer solution, Take 3 mLs (2.5 mg total) by nebulization every 6 (six) hours as needed for wheezing or shortness of breath., Disp: 75 mL, Rfl: 0   amphetamine-dextroamphetamine (ADDERALL) 30 MG tablet, Take 0.5 tablets by mouth 2 (two) times Walker. Take 0.5 tablets by mouth at 7 AM and 1 PM, Disp: 30 tablet, Rfl: 0   ARIPiprazole (ABILIFY) 5 MG tablet, Take 0.5 tablets (2.5 mg total) by mouth as directed. Take 2.5 mg every other day for 10 days and stop, Disp: 15 tablet, Rfl: 0   benzonatate (TESSALON) 100 MG capsule, Take 1 capsule (100 mg total) by mouth 2 (two) times Walker as needed for cough., Disp: 20 capsule, Rfl: 0   benztropine (COGENTIN) 0.5 MG tablet, TAKE 1 TABLET (0.5 MG TOTAL) BY MOUTH Walker AS NEEDED FOR TREMORS., Disp: 30 tablet, Rfl: 1   Butalbital-APAP-Caffeine 50-325-40 MG capsule, Take by mouth., Disp: , Rfl:    citalopram (CELEXA) 10 MG tablet, Take 1 tablet (10 mg total) by mouth Walker., Disp: 90 tablet, Rfl: 0   clotrimazole-betamethasone (LOTRISONE) cream, Apply topically 2 (two) times Walker., Disp: , Rfl:    cyclobenzaprine (FLEXERIL) 10 MG tablet, Take 1 tablet by mouth as needed., Disp: , Rfl:    hydrOXYzine (ATARAX) 25 MG tablet, Take 1-2 tablets (25-50 mg total) by mouth Walker as needed for anxiety., Disp: 180 tablet, Rfl: 0   lamoTRIgine (LAMICTAL) 200 MG tablet, Take 1 tablet (200 mg total) by mouth Walker., Disp: 90 tablet, Rfl: 0   lamoTRIgine (LAMICTAL) 25 MG tablet,  TAKE 2 TABLETS (50 MG TOTAL) BY MOUTH Walker. TAKE ALONG WITH 200 MG Walker - TOTAL OF 250 MG Walker, Disp: 180 tablet, Rfl: 0   levonorgestrel (MIRENA) 20 MCG/24HR IUD, by Intrauterine route., Disp: , Rfl:    melatonin 3 MG TABS tablet, Take 1 tablet (3 mg total) by mouth at bedtime., Disp: 30 tablet, Rfl: 0   naproxen (NAPROSYN) 500 MG tablet, 1 po bid prn, Disp: , Rfl:    Medications ordered in this encounter:  No orders of the defined types were placed in this encounter.    *If you need refills on other medications prior to your next appointment, please contact your pharmacy*  Follow-Up: Call back or seek an in-person evaluation if the symptoms worsen or if the condition fails to improve as anticipated.  Other Instructions Please take antibiotic as directed.  Increase fluid intake.  Use Saline nasal spray.  Take a Walker multivitamin. Consider use of Flonase or Nasacort OTC.  Place a humidifier in the bedroom.  Please call or return clinic if symptoms are not improving.  Sinusitis Sinusitis is redness, soreness, and swelling (inflammation) of the paranasal sinuses. Paranasal sinuses are air pockets within the bones of your face (beneath the eyes, the middle of the forehead, or above the eyes). In healthy paranasal sinuses, mucus is able to drain out, and air is able to circulate through them by way of your nose. However, when your paranasal sinuses are inflamed, mucus and air can become  trapped. This can allow bacteria and other germs to grow and cause infection. Sinusitis can develop quickly and last only a short time (acute) or continue over a long period (chronic). Sinusitis that lasts for more than 12 weeks is considered chronic.  CAUSES  Causes of sinusitis include: Allergies. Structural abnormalities, such as displacement of the cartilage that separates your nostrils (deviated septum), which can decrease the air flow through your nose and sinuses and affect sinus drainage. Functional  abnormalities, such as when the small hairs (cilia) that line your sinuses and help remove mucus do not work properly or are not present. SYMPTOMS  Symptoms of acute and chronic sinusitis are the same. The primary symptoms are pain and pressure around the affected sinuses. Other symptoms include: Upper toothache. Earache. Headache. Bad breath. Decreased sense of smell and taste. A cough, which worsens when you are lying flat. Fatigue. Fever. Thick drainage from your nose, which often is green and may contain pus (purulent). Swelling and warmth over the affected sinuses. DIAGNOSIS  Your caregiver will perform a physical exam. During the exam, your caregiver may: Look in your nose for signs of abnormal growths in your nostrils (nasal polyps). Tap over the affected sinus to check for signs of infection. View the inside of your sinuses (endoscopy) with a special imaging device with a light attached (endoscope), which is inserted into your sinuses. If your caregiver suspects that you have chronic sinusitis, one or more of the following tests may be recommended: Allergy tests. Nasal culture A sample of mucus is taken from your nose and sent to a lab and screened for bacteria. Nasal cytology A sample of mucus is taken from your nose and examined by your caregiver to determine if your sinusitis is related to an allergy. TREATMENT  Most cases of acute sinusitis are related to a viral infection and will resolve on their own within 10 days. Sometimes medicines are prescribed to help relieve symptoms (pain medicine, decongestants, nasal steroid sprays, or saline sprays).  However, for sinusitis related to a bacterial infection, your caregiver will prescribe antibiotic medicines. These are medicines that will help kill the bacteria causing the infection.  Rarely, sinusitis is caused by a fungal infection. In theses cases, your caregiver will prescribe antifungal medicine. For some cases of chronic  sinusitis, surgery is needed. Generally, these are cases in which sinusitis recurs more than 3 times per year, despite other treatments. HOME CARE INSTRUCTIONS  Drink plenty of water. Water helps thin the mucus so your sinuses can drain more easily. Use a humidifier. Inhale steam 3 to 4 times a day (for example, sit in the bathroom with the shower running). Apply a warm, moist washcloth to your face 3 to 4 times a day, or as directed by your caregiver. Use saline nasal sprays to help moisten and clean your sinuses. Take over-the-counter or prescription medicines for pain, discomfort, or fever only as directed by your caregiver. SEEK IMMEDIATE MEDICAL CARE IF: You have increasing pain or severe headaches. You have nausea, vomiting, or drowsiness. You have swelling around your face. You have vision problems. You have a stiff neck. You have difficulty breathing. MAKE SURE YOU:  Understand these instructions. Will watch your condition. Will get help right away if you are not doing well or get worse. Document Released: 01/28/2005 Document Revised: 04/22/2011 Document Reviewed: 02/12/2011 [facility] Patient Information 2014 [place], [place].    If you have been instructed to have an in-person evaluation today at a local Urgent Care facility,  facility, please use the link below. It will take you to a list of all of our available [facility], including address, phone number and hours of operation. Please do not delay care.  [facility]  If you or a family member do not have a primary care provider, use the link below to schedule a visit and establish care. When you choose a [facility] primary care physician or advanced practice provider, you gain a long-term partner in health. Find a Primary Care Provider  Learn more about [facility] in-office and virtual care options: [facility]

## 2021-03-29 NOTE — Progress Notes (Signed)
Virtual Visit Consent   Denise Walker, you are scheduled for a virtual visit with a [facility] provider today.     Just as with appointments in the office, your consent must be obtained to participate.  Your consent will be active for this visit and any virtual visit you may have with one of our providers in the next 365 days.     If you have a MyChart account, a copy of this consent can be sent to you electronically.  All virtual visits are billed to your insurance company just like a traditional visit in the office.    As this is a virtual visit, video technology does not allow for your provider to perform a traditional examination.  This may limit your provider's ability to fully assess your condition.  If your provider identifies any concerns that need to be evaluated in person or the need to arrange testing (such as labs, EKG, etc.), we will make arrangements to do so.     Although advances in technology are sophisticated, we cannot ensure that it will always work on either your end or our end.  If the connection with a video visit is poor, the visit may have to be switched to a telephone visit.  With either a video or telephone visit, we are not always able to ensure that we have a secure connection.     I need to obtain your verbal consent now.   Are you willing to proceed with your visit today?    [name] has provided verbal consent on 03/29/2021 for a virtual visit (video or telephone).   Denise Walker, [place]   Date: 03/29/2021 10:23 AM   Virtual Visit via Video Note   I, Denise Walker, connected with  Denise Walker  ([id number], 21-Mar-1990) on 03/29/21 at 10:15 AM EST by a video-enabled telemedicine application and verified that I am speaking with the correct person using two identifiers.  Location: Patient: Virtual Visit Location Patient: Home Provider: Virtual Visit Location Provider: Home Office   I discussed the limitations of evaluation and management by  telemedicine and the availability of in person appointments. The patient expressed understanding and agreed to proceed.    History of Present Illness: [name] is a 31 y.o. who identifies as a female who was assigned female at birth, and is being seen today for possible sinusitis. Patient endorses several days of nasal and head congestion with fatigue, now with significant sinus pain, sinus headache and maxillary pain. Unsure of fever but has felt feverish at times. Denies chills or aches. Daughter sick recently and tested negative for COVID, flu, strep and RSV. Patient is taking Mucinex OTC and even took a Fioricet which she has for migraines to see if it would abort the sinus headache -- some improvement but not resolution. Is prone to sinus infections.   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   Attention and concentration deficit 08/31/2020   Moderate episode of recurrent major depressive  disorder ([place]) 06/29/2020   High risk medication use 06/29/2020   History of ADHD 06/29/2020   Uterine scar from previous cesarean delivery affecting pregnancy 12/31/2018   Anti-D antibodies present during pregnancy 2018/08/03   BRCA gene mutation negative 05/04/2018   History of cesarean delivery 02/10/2018   Rh negative state in antepartum period 08/23/2017   History of eclampsia 07/22/2017   Migraines 04/09/2017   GAD  (generalized anxiety disorder) 12/01/2014    Allergies:   Medications:  Current Outpatient Medications:    amoxicillin-clavulanate (AUGMENTIN) 875-125 MG tablet, Take 1 tablet by mouth 2 (two) times daily., Disp: 14 tablet, Rfl: 0   albuterol (PROVENTIL) (2.5 MG/3ML) 0.083% nebulizer solution, Take 3 mLs (2.5 mg total) by nebulization every 6 (six) hours as needed for wheezing or shortness of breath., Disp: 75 mL, Rfl: 0   amphetamine-dextroamphetamine (ADDERALL) 30 MG tablet, Take 0.5 tablets by mouth 2 (two) times daily. Take 0.5 tablets by mouth at 7 AM and 1 PM, Disp: 30 tablet, Rfl: 0   ARIPiprazole (ABILIFY) 5 MG tablet, Take 0.5 tablets (2.5 mg total) by mouth as directed. Take 2.5 mg every other day for 10 days and stop, Disp: 15 tablet, Rfl: 0   benzonatate (TESSALON) 100 MG capsule, Take 1 capsule (100 mg total) by mouth 2 (two) times daily as needed for cough., Disp: 20 capsule, Rfl: 0   benztropine (COGENTIN) 0.5 MG tablet, TAKE 1 TABLET (0.5 MG TOTAL) BY MOUTH DAILY AS NEEDED FOR TREMORS., Disp: 30 tablet, Rfl: 1   Butalbital-APAP-Caffeine 50-325-40 MG capsule, Take by mouth., Disp: , Rfl:    citalopram (CELEXA) 10 MG tablet, Take 1 tablet (10 mg total) by mouth daily., Disp: 90 tablet, Rfl: 0   clotrimazole-betamethasone (LOTRISONE) cream, Apply topically 2 (two) times daily., Disp: , Rfl:    cyclobenzaprine (FLEXERIL) 10 MG tablet, Take 1 tablet by mouth as needed., Disp: , Rfl:    hydrOXYzine (ATARAX) 25 MG tablet, Take 1-2 tablets (25-50 mg total) by mouth daily as needed for anxiety., Disp: 180 tablet, Rfl: 0   lamoTRIgine (LAMICTAL) 200 MG tablet, Take 1 tablet (200 mg total) by mouth daily., Disp: 90 tablet, Rfl: 0   lamoTRIgine (LAMICTAL) 25 MG tablet, TAKE 2 TABLETS (50 MG TOTAL) BY MOUTH DAILY. TAKE ALONG WITH 200 MG DAILY - TOTAL OF 250 MG DAILY, Disp: 180 tablet, Rfl: 0   levonorgestrel (MIRENA) 20 MCG/24HR IUD, by Intrauterine route., Disp: , Rfl:    melatonin 3 MG TABS  tablet, Take 1 tablet (3 mg total) by mouth at bedtime., Disp: 30 tablet, Rfl: 0   naproxen (NAPROSYN) 500 MG tablet, 1 po bid prn, Disp: , Rfl:   Observations/Objective: Patient is well-developed, well-nourished in no acute distress.  Resting comfortably at home.  Head is normocephalic, atraumatic.  No labored breathing. Speech is clear and coherent with logical content.  Patient is alert and oriented at baseline.   Assessment and Plan: 1. Acute non-recurrent maxillary sinusitis - amoxicillin-clavulanate (AUGMENTIN) 875-125 MG tablet; Take 1 tablet by mouth 2 (two) times daily.  Dispense: 14 tablet; Refill: 0  Rx Augmentin.  Increase fluids.  Rest.  Saline nasal spray.  Probiotic.  Mucinex as directed.  Humidifier in bedroom.  Call or return to clinic if symptoms are not improving.   Follow Up Instructions: I discussed the assessment and treatment plan with the patient. The patient was provided an opportunity to ask questions and all were answered. The patient agreed with the plan and demonstrated an  understanding of the instructions.  A copy of instructions were sent to the patient via MyChart unless otherwise noted below.   The patient was advised to call back or seek an in-person evaluation if the symptoms worsen or if the condition fails to improve as anticipated.  Time:  I spent 9 minutes with the patient via telehealth technology discussing the above problems/concerns.    Denise Rio, PA-C

## 2021-03-31 ENCOUNTER — Other Ambulatory Visit: Payer: Self-pay | Admitting: Psychiatry

## 2021-03-31 DIAGNOSIS — G2589 Other specified extrapyramidal and movement disorders: Secondary | ICD-10-CM

## 2021-04-04 ENCOUNTER — Other Ambulatory Visit: Payer: Self-pay

## 2021-04-04 ENCOUNTER — Telehealth: Payer: Medicaid Other | Admitting: Psychiatry

## 2021-04-13 ENCOUNTER — Telehealth: Payer: 59 | Admitting: Physician Assistant

## 2021-04-13 DIAGNOSIS — K219 Gastro-esophageal reflux disease without esophagitis: Secondary | ICD-10-CM | POA: Diagnosis not present

## 2021-04-13 MED ORDER — OMEPRAZOLE 40 MG PO CPDR: 40.0000 mg | DELAYED_RELEASE_CAPSULE | Freq: Every day | ORAL | 0 refills | Status: DC

## 2021-04-13 NOTE — Progress Notes (Signed)
?Virtual Visit Consent  ? ?Denise Walker, you are scheduled for a virtual visit with a [facility] provider today.   ?  ?Just as with appointments in the office, your consent must be obtained to participate.  Your consent will be active for this visit and any virtual visit you may have with one of our providers in the next 365 days.   ?  ?If you have a MyChart account, a copy of this consent can be sent to you electronically.  All virtual visits are billed to your insurance company just like a traditional visit in the office.   ? ?As this is a virtual visit, video technology does not allow for your provider to perform a traditional examination.  This may limit your provider's ability to fully assess your condition.  If your provider identifies any concerns that need to be evaluated in person or the need to arrange testing (such as labs, EKG, etc.), we will make arrangements to do so.   ?  ?Although advances in technology are sophisticated, we cannot ensure that it will always work on either your end or our end.  If the connection with a video visit is poor, the visit may have to be switched to a telephone visit.  With either a video or telephone visit, we are not always able to ensure that we have a secure connection.    ? ?I need to obtain your verbal consent now.   Are you willing to proceed with your visit today?  ?  ?Denise Walker has provided verbal consent on 04/13/2021 for a virtual visit (video or telephone). ?  ?Mar Daring, PA-C  ? ?Date: 04/13/2021 6:47 PM ? ? ?Virtual Visit via Video Note  ? ?Denise Walker, connected with  Denise Walker  ([id number], [birth year]) on 04/13/21 at  6:45 PM EST by a video-enabled telemedicine application and verified that I am speaking with the correct person using two identifiers. ? ?Location: ?Patient: Virtual Visit Location Patient: Home ?Provider: Virtual Visit Location Provider: Home Office ?  ?I discussed the limitations of evaluation and management by  telemedicine and the availability of in person appointments. The patient expressed understanding and agreed to proceed.   ? ?History of Present Illness: ?Denise Walker is a 31 y.o. who identifies as a female who was assigned female at birth, and is being seen today for GERD symptoms. ? ?HPI: Gastroesophageal Reflux ?She complains of abdominal pain and belching. This is a chronic problem. The problem has been unchanged. The symptoms are aggravated by certain foods, medications and stress. Pertinent negatives include no fatigue or melena. She has tried a PPI for the symptoms. The treatment provided significant relief.   ?Just needs refill of Omeprazole 66m. Her previous prescriber of this medication left the practice. She has not been able to schedule with a new provider yet. ? ?Problems:  ?Patient Active Problem List  ? Diagnosis Date Noted  ? Bacterial infection 01/21/2021  ? History of influenza 01/21/2021  ? Bronchitis 01/21/2021  ? 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 Chronic post-traumatic stress disorder (PTSD) 12/21/2020  ? MDD (major depressive disorder), recurrent, in full remission ([facility] 11/15/2020  ? 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 MDD (major depressive disorder), recurrent, in partial remission ([facility] 10/02/2020  ? 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 Attention and concentration deficit 08/31/2020  ? Moderate episode of recurrent major depressive disorder (HBrule 06/29/2020  ?  High risk medication use 06/29/2020  ? History of ADHD 06/29/2020  ? Uterine scar from previous cesarean delivery affecting pregnancy 12/31/2018  ? Anti-D antibodies present during pregnancy 2018/07/10  ? BRCA gene mutation negative 05/04/2018  ? History of cesarean delivery 02/10/2018  ? Rh negative state in antepartum period 08/23/2017  ? History of eclampsia 07/22/2017  ? Migraines 04/09/2017  ? GAD  (generalized anxiety disorder) 12/01/2014  ?  ?Allergies:  ?Allergies  ?Allergen Reactions  ? Fish Allergy Shortness Of Breath  ? Shellfish Allergy Shortness Of Breath  ? ?Medications:  ?Current Outpatient Medications:  ?  omeprazole (PRILOSEC) 40 MG capsule, Take 1 capsule (40 mg total) by mouth daily., Disp: 90 capsule, Rfl: 0 ?  albuterol (PROVENTIL) (2.5 MG/3ML) 0.083% nebulizer solution, Take 3 mLs (2.5 mg total) by nebulization every 6 (six) hours as needed for wheezing or shortness of breath., Disp: 75 mL, Rfl: 0 ?  amoxicillin-clavulanate (AUGMENTIN) 875-125 MG tablet, Take 1 tablet by mouth 2 (two) times daily., Disp: 14 tablet, Rfl: 0 ?  amphetamine-dextroamphetamine (ADDERALL) 30 MG tablet, Take 0.5 tablets by mouth 2 (two) times daily. Take 0.5 tablets by mouth at 7 AM and 1 PM, Disp: 30 tablet, Rfl: 0 ?  ARIPiprazole (ABILIFY) 5 MG tablet, Take 0.5 tablets (2.5 mg total) by mouth as directed. Take 2.5 mg every other day for 10 days and stop, Disp: 15 tablet, Rfl: 0 ?  benzonatate (TESSALON) 100 MG capsule, Take 1 capsule (100 mg total) by mouth 2 (two) times daily as needed for cough., Disp: 20 capsule, Rfl: 0 ?  benztropine (COGENTIN) 0.5 MG tablet, TAKE 1 TABLET (0.5 MG TOTAL) BY MOUTH DAILY AS NEEDED FOR TREMORS., Disp: 30 tablet, Rfl: 1 ?  Butalbital-APAP-Caffeine 50-325-40 MG capsule, Take by mouth., Disp: , Rfl:  ?  citalopram (CELEXA) 10 MG tablet, Take 1 tablet (10 mg total) by mouth daily., Disp: 90 tablet, Rfl: 0 ?  clotrimazole-betamethasone (LOTRISONE) cream, Apply topically 2 (two) times daily., Disp: , Rfl:  ?  cyclobenzaprine (FLEXERIL) 10 MG tablet, Take 1 tablet by mouth as needed., Disp: , Rfl:  ?  hydrOXYzine (ATARAX) 25 MG tablet, Take 1-2 tablets (25-50 mg total) by mouth daily as needed for anxiety., Disp: 180 tablet, Rfl: 0 ?  lamoTRIgine (LAMICTAL) 200 MG tablet, Take 1 tablet (200 mg total) by mouth daily., Disp: 90 tablet, Rfl: 0 ?  lamoTRIgine (LAMICTAL) 25 MG tablet, TAKE 2  TABLETS (50 MG TOTAL) BY MOUTH DAILY. TAKE ALONG WITH 200 MG DAILY - TOTAL OF 250 MG DAILY, Disp: 180 tablet, Rfl: 0 ?  levonorgestrel (MIRENA) 20 MCG/24HR IUD, by Intrauterine route., Disp: , Rfl:  ?  melatonin 3 MG TABS tablet, Take 1 tablet (3 mg total) by mouth at bedtime., Disp: 30 tablet, Rfl: 0 ?  naproxen (NAPROSYN) 500 MG tablet, 1 po bid prn, Disp: , Rfl:  ? ?Observations/Objective: ?Patient is well-developed, well-nourished in no acute distress.  ?Resting comfortably at home.  ?Head is normocephalic, atraumatic.  ?No labored breathing.  ?Speech is clear and coherent with logical content.  ?Patient is alert and oriented at baseline.  ? ? ?Assessment and Plan: ?1. Gastroesophageal reflux disease without esophagitis ?- omeprazole (PRILOSEC) 40 MG capsule; Take 1 capsule (40 mg total) by mouth daily.  Dispense: 90 capsule; Refill: 0 ? ?- Medication refilled x 1 ?- Will follow up with new provider for future refills. ? ?Follow Up Instructions: ?I discussed the assessment and treatment plan with the patient. The patient  was provided an opportunity to ask questions and all were answered. The patient agreed with the plan and demonstrated an understanding of the instructions.  A copy of instructions were sent to the patient via MyChart unless otherwise noted below.  ? ?The patient was advised to call back or seek an in-person evaluation if the symptoms worsen or if the condition fails to improve as anticipated. ? ?Time:  ?I spent 8 minutes with the patient via telehealth technology discussing the above problems/concerns.   ? ?Mar Daring, PA-C ?

## 2021-04-13 NOTE — Patient Instructions (Signed)
Denise Walker, thank you for joining Margaretann Loveless, PA-C for today's virtual visit.  While this provider is not your primary care provider (PCP), if your PCP is located in our provider database this encounter information will be shared with them immediately following your visit.  Consent: (Patient) Denise Walker provided verbal consent for this virtual visit at the beginning of the encounter.  Current Medications:  Current Outpatient Medications:    omeprazole (PRILOSEC) 40 MG capsule, Take 1 capsule (40 mg total) by mouth daily., Disp: 90 capsule, Rfl: 0   albuterol (PROVENTIL) (2.5 MG/3ML) 0.083% nebulizer solution, Take 3 mLs (2.5 mg total) by nebulization every 6 (six) hours as needed for wheezing or shortness of breath., Disp: 75 mL, Rfl: 0   amoxicillin-clavulanate (AUGMENTIN) 875-125 MG tablet, Take 1 tablet by mouth 2 (two) times daily., Disp: 14 tablet, Rfl: 0   amphetamine-dextroamphetamine (ADDERALL) 30 MG tablet, Take 0.5 tablets by mouth 2 (two) times daily. Take 0.5 tablets by mouth at 7 AM and 1 PM, Disp: 30 tablet, Rfl: 0   ARIPiprazole (ABILIFY) 5 MG tablet, Take 0.5 tablets (2.5 mg total) by mouth as directed. Take 2.5 mg every other day for 10 days and stop, Disp: 15 tablet, Rfl: 0   benzonatate (TESSALON) 100 MG capsule, Take 1 capsule (100 mg total) by mouth 2 (two) times daily as needed for cough., Disp: 20 capsule, Rfl: 0   benztropine (COGENTIN) 0.5 MG tablet, TAKE 1 TABLET (0.5 MG TOTAL) BY MOUTH DAILY AS NEEDED FOR TREMORS., Disp: 30 tablet, Rfl: 1   Butalbital-APAP-Caffeine 50-325-40 MG capsule, Take by mouth., Disp: , Rfl:    citalopram (CELEXA) 10 MG tablet, Take 1 tablet (10 mg total) by mouth daily., Disp: 90 tablet, Rfl: 0   clotrimazole-betamethasone (LOTRISONE) cream, Apply topically 2 (two) times daily., Disp: , Rfl:    cyclobenzaprine (FLEXERIL) 10 MG tablet, Take 1 tablet by mouth as needed., Disp: , Rfl:    hydrOXYzine (ATARAX) 25 MG tablet, Take 1-2  tablets (25-50 mg total) by mouth daily as needed for anxiety., Disp: 180 tablet, Rfl: 0   lamoTRIgine (LAMICTAL) 200 MG tablet, Take 1 tablet (200 mg total) by mouth daily., Disp: 90 tablet, Rfl: 0   lamoTRIgine (LAMICTAL) 25 MG tablet, TAKE 2 TABLETS (50 MG TOTAL) BY MOUTH DAILY. TAKE ALONG WITH 200 MG DAILY - TOTAL OF 250 MG DAILY, Disp: 180 tablet, Rfl: 0   levonorgestrel (MIRENA) 20 MCG/24HR IUD, by Intrauterine route., Disp: , Rfl:    melatonin 3 MG TABS tablet, Take 1 tablet (3 mg total) by mouth at bedtime., Disp: 30 tablet, Rfl: 0   naproxen (NAPROSYN) 500 MG tablet, 1 po bid prn, Disp: , Rfl:    Medications ordered in this encounter:  Meds ordered this encounter  Medications   omeprazole (PRILOSEC) 40 MG capsule    Sig: Take 1 capsule (40 mg total) by mouth daily.    Dispense:  90 capsule    Refill:  0    Order Specific Question:   Supervising Provider    Answer:   Hyacinth Meeker, BRIAN [[id number]]     *If you need refills on other medications prior to your next appointment, please contact your pharmacy*  Follow-Up: Call back or seek an in-person evaluation if the symptoms worsen or if the condition fails to improve as anticipated.  Other Instructions Food Choices for Gastroesophageal Reflux Disease, Adult When you have gastroesophageal reflux disease (GERD), the foods you eat and your eating habits are very important.  Choosing the right foods can help ease your discomfort. Think about working with a food expert (dietitian) to help you make good choices. What are tips for following this plan? Reading food labels Look for foods that are low in saturated fat. Foods that may help with your symptoms include: Foods that have less than 5% of daily value (DV) of fat. Foods that have 0 grams of trans fat. Cooking Do not fry your food. Cook your food by baking, steaming, grilling, or broiling. These are all methods that do not need a lot of fat for cooking. To add flavor, try to use herbs that  are low in spice and acidity. Meal planning  Choose healthy foods that are low in fat, such as: Fruits and vegetables. Whole grains. Low-fat dairy products. Lean meats, fish, and poultry. Eat small meals often instead of eating 3 large meals each day. Eat your meals slowly in a place where you are relaxed. Avoid bending over or lying down until 2-3 hours after eating. Limit high-fat foods such as fatty meats or fried foods. Limit your intake of fatty foods, such as oils, butter, and shortening. Avoid the following as told by your doctor: Foods that cause symptoms. These may be different for different people. Keep a food diary to keep track of foods that cause symptoms. Alcohol. Drinking a lot of liquid with meals. Eating meals during the 2-3 hours before bed. Lifestyle Stay at a healthy weight. Ask your doctor what weight is healthy for you. If you need to lose weight, work with your doctor to do so safely. Exercise for at least 30 minutes on 5 or more days each week, or as told by your doctor. Wear loose-fitting clothes. Do not smoke or use any products that contain nicotine or tobacco. If you need help quitting, ask your doctor. Sleep with the head of your bed higher than your feet. Use a wedge under the mattress or blocks under the bed frame to raise the head of the bed. Chew sugar-free gum after meals. What foods should eat? Eat a healthy, well-balanced diet of fruits, vegetables, whole grains, low-fat dairy products, lean meats, fish, and poultry. Each person is different. Foods that may cause symptoms in one person may not cause any symptoms in another person. Work with your doctor to find foods that are safe for you. The items listed above may not be a complete list of what you can eat and drink. Contact a food expert for more options. What foods should I avoid? Limiting some of these foods may help in managing the symptoms of GERD. Everyone is different. Talk with a food expert or  your doctor to help you find the exact foods to avoid, if any. Fruits Any fruits prepared with added fat. Any fruits that cause symptoms. For some people, this may include citrus fruits, such as oranges, grapefruit, pineapple, and lemons. Vegetables Deep-fried vegetables. [place] fries. Any vegetables prepared with added fat. Any vegetables that cause symptoms. For some people, this may include tomatoes and tomato products, chili peppers, onions and garlic, and horseradish. Grains Pastries or quick breads with added fat. Meats and other proteins High-fat meats, such as fatty beef or pork, hot dogs, ribs, ham, sausage, salami, and bacon. Fried meat or protein, including fried fish and fried chicken. Nuts and nut butters, in large amounts. Dairy Whole milk and chocolate milk. Sour cream. Cream. Ice cream. Cream cheese. Milkshakes. Fats and oils Butter. Margarine. Shortening. Ghee. Beverages Coffee and tea, with or  without caffeine. Carbonated beverages. Sodas. Energy drinks. Fruit juice made with acidic fruits, such as orange or grapefruit. Tomato juice. Alcoholic drinks. Sweets and desserts Chocolate and cocoa. Donuts. Seasonings and condiments Pepper. Peppermint and spearmint. Added salt. Any condiments, herbs, or seasonings that cause symptoms. For some people, this may include curry, hot sauce, or vinegar-based salad dressings. The items listed above may not be a complete list of what you should not eat and drink. Contact a food expert for more options. Questions to ask your doctor Diet and lifestyle changes are often the first steps that are taken to manage symptoms of GERD. If diet and lifestyle changes do not help, talk with your doctor about taking medicines. Where to find more information International [employer] for Gastrointestinal Disorders: aboutgerd.org Summary When you have GERD, food and lifestyle choices are very important in easing your symptoms. Eat small meals often instead  of 3 large meals a day. Eat your meals slowly and in a place where you are relaxed. Avoid bending over or lying down until 2-3 hours after eating. Limit high-fat foods such as fatty meats or fried foods. This information is not intended to replace advice given to you by your health care provider. Make sure you discuss any questions you have with your health care provider. Document Revised: 08/09/2019 Document Reviewed: 08/09/2019 Elsevier Patient Education  2022 [employer].    If you have been instructed to have an in-person evaluation today at a local Urgent Care facility, please use the link below. It will take you to a list of all of our available [facility], including address, phone number and hours of operation. Please do not delay care.  [facility]  If you or a family member do not have a primary care provider, use the link below to schedule a visit and establish care. When you choose a [facility] primary care physician or advanced practice provider, you gain a long-term partner in health. Find a Primary Care Provider  Learn more about [facility]'s in-office and virtual care options: [facility] - Get Care Now

## 2021-04-16 ENCOUNTER — Other Ambulatory Visit: Payer: Self-pay | Admitting: Psychiatry

## 2021-04-16 DIAGNOSIS — T50905A Adverse effect of unspecified drugs, medicaments and biological substances, initial encounter: Secondary | ICD-10-CM

## 2021-04-17 ENCOUNTER — Encounter: Payer: Self-pay | Admitting: Psychiatry

## 2021-04-17 ENCOUNTER — Other Ambulatory Visit: Payer: Self-pay

## 2021-04-17 ENCOUNTER — Telehealth (INDEPENDENT_AMBULATORY_CARE_PROVIDER_SITE_OTHER): Payer: 59 | Admitting: Psychiatry

## 2021-04-17 DIAGNOSIS — F4312 Post-traumatic stress disorder, chronic: Secondary | ICD-10-CM

## 2021-04-17 DIAGNOSIS — R69 Illness, unspecified: Secondary | ICD-10-CM | POA: Diagnosis not present

## 2021-04-17 DIAGNOSIS — F411 Generalized anxiety disorder: Secondary | ICD-10-CM | POA: Diagnosis not present

## 2021-04-17 DIAGNOSIS — F5105 Insomnia due to other mental disorder: Secondary | ICD-10-CM

## 2021-04-17 DIAGNOSIS — F900 Attention-deficit hyperactivity disorder, predominantly inattentive type: Secondary | ICD-10-CM | POA: Diagnosis not present

## 2021-04-17 DIAGNOSIS — F331 Major depressive disorder, recurrent, moderate: Secondary | ICD-10-CM

## 2021-04-17 MED ORDER — AMPHETAMINE-DEXTROAMPHETAMINE 30 MG PO TABS: 15.0000 mg | ORAL_TABLET | Freq: Two times a day (BID) | ORAL | 0 refills | Status: DC

## 2021-04-17 MED ORDER — LAMOTRIGINE 200 MG PO TABS: 200.0000 mg | ORAL_TABLET | Freq: Every day | ORAL | 0 refills | Status: DC

## 2021-04-17 MED ORDER — CITALOPRAM HYDROBROMIDE 10 MG PO TABS: 10.0000 mg | ORAL_TABLET | Freq: Every day | ORAL | 0 refills | Status: DC

## 2021-04-17 NOTE — Progress Notes (Signed)
Virtual Visit via Video Note  I connected with Denise Walker on 04/17/21 at  3:20 PM EST by a video enabled telemedicine application and verified that I am speaking with the correct person using two identifiers.  Location Provider Location : [facility] Patient Location : Home  Participants: Patient , Provider   I discussed the limitations of evaluation and management by telemedicine and the availability of in person appointments. The patient expressed understanding and agreed to proceed.     I discussed the assessment and treatment plan with the patient. The patient was provided an opportunity to ask questions and all were answered. The patient agreed with the plan and demonstrated an understanding of the instructions.   The patient was advised to call back or seek an in-person evaluation if the symptoms worsen or if the condition fails to improve as anticipated.    [facility] MD OP Progress Note  04/17/2021 5:23 PM Taisia Fantini  MRN:  [id number]  Chief Complaint:  Chief Complaint  Patient presents with   Follow-up: 31 year old Caucasian female with history of MDD, GAD, ADHD, multiple psychosocial stressors presented for medication management.   HPI: Denise Walker is a 31 year old Caucasian female, stay-at-home mom, lives in [place] , has a history of MDD, GAD, history of ADHD, insomnia, PTSD was evaluated by telemedicine today.  Patient reports that one of her children got sick with pneumonia and she hence has been in and out of the emergency room as well as outpatient pediatrician's office the past couple of weeks.  Patient reports her child is currently responding to antibiotics and is currently better and that is a relief.  Patient reports the last couple of weeks she hence has been very anxious, had trouble relaxing, was worried about things in general, had trouble sleeping.  She reports she spent several hours in the emergency room several nights which did have an impact on her sleep.   This week has been better and she is trying to catch up on her sleep.  She reports her anxiety has also calmed down this week since her child is getting better.  Reports attention and focus as good on the current dosage of Adderall.  It did make her jittery the first couple of days when she tried the higher dosage however it does not do that anymore.  Patient is compliant on her other medications as prescribed.  Denies side effects.  Patient continues to follow-up with her therapist.  Denies suicidality, homicidality or perceptual disturbances.  Denies any other concerns today.      Visit Diagnosis:    ICD-10-CM   1. Moderate episode of recurrent major depressive disorder (HCC)  F33.1     2. GAD (generalized anxiety disorder)  F41.1 lamoTRIgine (LAMICTAL) 200 MG tablet    citalopram (CELEXA) 10 MG tablet    3. Insomnia due to mental disorder  F51.05    mood    4. ADHD (attention deficit hyperactivity disorder), inattentive type  F90.0 amphetamine-dextroamphetamine (ADDERALL) 30 MG tablet    amphetamine-dextroamphetamine (ADDERALL) 30 MG tablet    5. Chronic post-traumatic stress disorder (PTSD)  F43.12       Past Psychiatric History: Reviewed past psychiatric history from progress note on 06/29/2020.  Past trials of medications like Lexapro, Zoloft, Celexa, Trintellix, Concerta, Ritalin, Wellbutrin.  Neuropsychological testing completed-11/08/2020-diagnosed with ADHD.  Past Medical History:  Past Medical History:  Diagnosis Date   Anxiety    Back pain    low back pain with lumbar radiculitis  Bilateral shoulder pain    BRCA negative 04/2018   MyRisk neg; IBIS=8.9%/riskscore=6.7%   Eclampsia    Family history of ovarian cancer    Gestational diabetes    Migraine headache    PCOS (polycystic ovarian syndrome)     Past Surgical History:  Procedure Laterality Date   CESAREAN SECTION N/A 07/15/2014   Procedure: CESAREAN SECTION;  Surgeon: Gae Dry, MD;  Location:  [facility] ORS;  Service: Obstetrics;  Laterality: N/A;   CESAREAN SECTION N/A 02/10/2018   Procedure: CESAREAN SECTION;  Surgeon: Will Bonnet, MD;  Location: [facility] ORS;  Service: Obstetrics;  Laterality: N/A;   CESAREAN SECTION N/A 12/31/2018   Procedure: CESAREAN SECTION;  Surgeon: [name], MD;  Location: [facility] ORS;  Service: Obstetrics;  Laterality: N/A;   CESAREAN SECTION     HERNIA REPAIR  10/2013   WISDOM TOOTH EXTRACTION      Family Psychiatric History: Reviewed family psychiatric history from progress note on 06/29/2020.  Family History:  Family History  Problem Relation Age of Onset   Bipolar disorder Mother    Ovarian cancer Mother    Arthritis Mother    Ovarian cancer Maternal Aunt    Diabetes Paternal Aunt    Breast cancer Maternal Grandmother    Diabetes Paternal Grandfather    Uterine cancer Cousin    ADD / ADHD Daughter    ODD Daughter     Social History: Reviewed social history from progress note on 06/29/2020. Social History   Socioeconomic History   Marital status: Married    Spouse name: Einar Pheasant   Number of children: 3   Years of education: high school   Highest education level: Not on file  Occupational History   Not on file  Tobacco Use   Smoking status: Never   Smokeless tobacco: Never  Vaping Use   Vaping Use: Never used  Substance and Sexual Activity   Alcohol use: Yes    Comment: occasional wine cooler   Drug use: Never   Sexual activity: Yes    Birth control/protection: I.U.D.    Comment: will discuss with MD  Other Topics Concern   Not on file  Social History Narrative   Not on file   Social Determinants of Health   Financial Resource Strain: Not on file  Food Insecurity: Not on file  Transportation Needs: Not on file  Physical Activity: Not on file  Stress: Not on file  Social Connections: Not on file    Allergies:  Allergies  Allergen Reactions   Fish Allergy Shortness Of Breath   Shellfish Allergy Shortness Of  Breath    Metabolic Disorder Labs: Lab Results  Component Value Date   HGBA1C 5.5 06/08/2020   Lab Results  Component Value Date   PROLACTIN 8.8 03/27/2017   Lab Results  Component Value Date   CHOL 175 06/08/2020   TRIG 189 (H) 06/08/2020   HDL 47 06/08/2020   CHOLHDL 3.7 06/08/2020   LDLCALC 96 06/08/2020   Lab Results  Component Value Date   TSH 4.350 09/22/2019   TSH 2.170 03/27/2017    Therapeutic Level Labs: No results found for: LITHIUM No results found for: VALPROATE No components found for:  CBMZ  Current Medications: Current Outpatient Medications  Medication Sig Dispense Refill   [START ON 05/16/2021] amphetamine-dextroamphetamine (ADDERALL) 30 MG tablet Take 0.5 tablets by mouth 2 (two) times daily. Take half tablet daily at 7 AM and 1 PM 30 tablet 0   albuterol (PROVENTIL) (2.5  MG/3ML) 0.083% nebulizer solution Take 3 mLs (2.5 mg total) by nebulization every 6 (six) hours as needed for wheezing or shortness of breath. 75 mL 0   amoxicillin-clavulanate (AUGMENTIN) 875-125 MG tablet Take 1 tablet by mouth 2 (two) times daily. 14 tablet 0   amphetamine-dextroamphetamine (ADDERALL) 30 MG tablet Take 0.5 tablets by mouth 2 (two) times daily. Take 0.5 tablets by mouth at 7 AM and 1 PM 30 tablet 0   Butalbital-APAP-Caffeine 50-325-40 MG capsule Take by mouth.     citalopram (CELEXA) 10 MG tablet Take 1 tablet (10 mg total) by mouth daily. 90 tablet 0   clotrimazole-betamethasone (LOTRISONE) cream Apply topically 2 (two) times daily.     cyclobenzaprine (FLEXERIL) 10 MG tablet Take 1 tablet by mouth as needed.     hydrOXYzine (ATARAX) 25 MG tablet Take 1-2 tablets (25-50 mg total) by mouth daily as needed for anxiety. 180 tablet 0   lamoTRIgine (LAMICTAL) 200 MG tablet Take 1 tablet (200 mg total) by mouth daily. 90 tablet 0   lamoTRIgine (LAMICTAL) 25 MG tablet TAKE 2 TABLETS (50 MG TOTAL) BY MOUTH DAILY. TAKE ALONG WITH 200 MG DAILY - TOTAL OF 250 MG DAILY 180 tablet  0   levonorgestrel (MIRENA) 20 MCG/24HR IUD by Intrauterine route.     melatonin 3 MG TABS tablet Take 1 tablet (3 mg total) by mouth at bedtime. 30 tablet 0   naproxen (NAPROSYN) 500 MG tablet 1 po bid prn     omeprazole (PRILOSEC) 40 MG capsule Take 1 capsule (40 mg total) by mouth daily. 90 capsule 0   No current facility-administered medications for this visit.     Musculoskeletal: Strength & Muscle Tone:  UTA Gait & Station:  Seated Patient leans: N/A  Psychiatric Specialty Exam: Review of Systems  Psychiatric/Behavioral:  Positive for sleep disturbance. The patient is nervous/anxious.   All other systems reviewed and are negative.  There were no vitals taken for this visit.There is no height or weight on file to calculate BMI.  General Appearance: Casual  Eye Contact:  Fair  Speech:  Clear and Coherent  Volume:  Normal  Mood:  Anxious, improving  Affect:  Congruent  Thought Process:  Goal Directed and Descriptions of Associations: Intact  Orientation:  Full (Time, Place, and Person)  Thought Content: Logical   Suicidal Thoughts:  No  Homicidal Thoughts:  No  Memory:  Immediate;   Fair Recent;   Fair Remote;   Fair  Judgement:  Fair  Insight:  Fair  Psychomotor Activity:  Normal  Concentration:  Concentration: Fair and Attention Span: Fair  Recall:  [employer] of Knowledge: Fair  Language: Fair  Akathisia:  No  Handed:  Left  AIMS (if indicated): done,0  Assets:  Communication Skills Desire for [place] Talents/Skills Transportation  ADL's:  Intact  Cognition: WNL  Sleep:  Poor, due to child being sick    Screenings: AIMS    Flowsheet Row Video Visit from 10/02/2020 in [place] Total Score 2      GAD-7    Flowsheet Row Video Visit from 04/17/2021 in [place] Office Visit from 09/07/2020 in [place] Video Visit from 08/31/2020 in  [place] Video Visit from 06/29/2020 in [place] Visit from 06/08/2020 in [place]  Total GAD-7 Score _0 [employer]    Flowsheet Row  Video Visit from 04/17/2021 in [place] Office Visit from 03/06/2021 in [place] Office Visit from 11/23/2020 in [place] from 09/22/2020 in [place] Visit from 09/07/2020 in [place]  PHQ-2 Total Score 0 _0 PHQ-9 Total Score -- 7 11 -- 11      Flowsheet Row Video Visit from 04/17/2021 in [place] Counselor from 03/23/2021 in [place] Counselor from 02/16/2021 in [place] No Risk No Risk No Risk        Assessment and Plan: Carolann Brazell is a 31 year old Caucasian female, lives in [place], married, has a history of depression, anxiety, PCOS, chronic pain was evaluated by telemedicine today.  Patient with recent psychosocial stressors however overall currently doing well on the current medication regimen.  Plan as noted below.  Plan  MDD in remission Celexa 10 mg p.o. daily Lamotrigine 250 mg p.o. daily  GAD-stable Celexa 10 mg p.o. daily Hydroxyzine 25-50 mg p.o. daily as needed for anxiety. Klonopin 0.5 mg as needed for severe panic attacks Continue CBT with Ms. Christina Hussami  ADHD-improving Adderall 30 mg p.o. daily in divided dosage.  Provided 2 prescriptions with date specified. Reviewed [place] PMP aware  Chronic PTSD-stable Continue CBT  Insomnia-unstable Patient to work on sleep hygiene techniques.  Sleep problems due to psychosocial stressors as noted above.  However is currently getting better.  Follow-up in clinic in 6 to 8 weeks or sooner in person.  Collaboration of  Care: Collaboration of Care: Referral or follow-up with counselor/therapist AEB encouraged patient to continue to follow up with therapist, has upcoming appointment.  Patient/Guardian was advised Release of Information must be obtained prior to any record release in order to collaborate their care with an outside provider. Patient/Guardian was advised if they have not already done so to contact the registration department to sign all necessary forms in order for [place] to release information regarding their care.   Consent: Patient/Guardian gives verbal consent for treatment and assignment of benefits for services provided during this visit. Patient/Guardian expressed understanding and agreed to proceed.   This note was generated in part or whole with voice recognition software. Voice recognition is usually quite accurate but there are transcription errors that can and very often do occur. I apologize for any typographical errors that were not detected and corrected.      Ursula Alert, MD 04/18/2021, 9:08 AM

## 2021-04-24 ENCOUNTER — Other Ambulatory Visit: Payer: Self-pay | Admitting: Student

## 2021-04-24 DIAGNOSIS — M5412 Radiculopathy, cervical region: Secondary | ICD-10-CM

## 2021-04-24 DIAGNOSIS — G43719 Chronic migraine without aura, intractable, without status migrainosus: Secondary | ICD-10-CM | POA: Diagnosis not present

## 2021-05-01 ENCOUNTER — Ambulatory Visit
Admission: RE | Admit: 2021-05-01 | Discharge: 2021-05-01 | Disposition: A | Discharge status: Home / Self Care | Payer: 59 | Source: Ambulatory Visit | Attending: Family Medicine | Admitting: Family Medicine

## 2021-05-01 ENCOUNTER — Ambulatory Visit (INDEPENDENT_AMBULATORY_CARE_PROVIDER_SITE_OTHER): Payer: 59 | Admitting: Family Medicine

## 2021-05-01 ENCOUNTER — Other Ambulatory Visit: Payer: Self-pay

## 2021-05-01 ENCOUNTER — Encounter: Payer: Self-pay | Admitting: Family Medicine

## 2021-05-01 ENCOUNTER — Ambulatory Visit
Admission: RE | Admit: 2021-05-01 | Discharge: 2021-05-01 | Disposition: A | Discharge status: Home / Self Care | Payer: 59 | Attending: Family Medicine | Admitting: Family Medicine

## 2021-05-01 VITALS — BP 128/78 | HR 88 | Ht 59.0 in | Wt 213.0 lb

## 2021-05-01 DIAGNOSIS — Z1159 Encounter for screening for other viral diseases: Secondary | ICD-10-CM

## 2021-05-01 DIAGNOSIS — M25561 Pain in right knee: Secondary | ICD-10-CM

## 2021-05-01 DIAGNOSIS — E569 Vitamin deficiency, unspecified: Secondary | ICD-10-CM | POA: Diagnosis not present

## 2021-05-01 DIAGNOSIS — S99922A Unspecified injury of left foot, initial encounter: Secondary | ICD-10-CM

## 2021-05-01 DIAGNOSIS — E282 Polycystic ovarian syndrome: Secondary | ICD-10-CM | POA: Diagnosis not present

## 2021-05-01 DIAGNOSIS — Z13220 Encounter for screening for lipoid disorders: Secondary | ICD-10-CM | POA: Diagnosis not present

## 2021-05-01 DIAGNOSIS — G8929 Other chronic pain: Secondary | ICD-10-CM | POA: Diagnosis not present

## 2021-05-01 DIAGNOSIS — G43719 Chronic migraine without aura, intractable, without status migrainosus: Secondary | ICD-10-CM | POA: Diagnosis not present

## 2021-05-01 MED ORDER — VITAMIN D (ERGOCALCIFEROL) 1.25 MG (50000 UNIT) PO CAPS: 50000.0000 [IU] | ORAL_CAPSULE | ORAL | 0 refills | Status: DC

## 2021-05-01 NOTE — Assessment & Plan Note (Signed)
Chronic condition, has previously been diagnosed and assessed by OB/GYN via [facility] clinic.  Unfortunately, her longstanding physician has left the practice.  She is currently without an OB/GYN, offered referral for the same and she is awaiting finding a new physician prior to new referral. ? ?She is interested in management, the multifactorial nature of this condition was reviewed and treatment strategies focused on risk reduction.  Initial management targeted towards risk stratification labs, plan to discuss glycemic control/weight management strategies at her return for annual physical. ? ?Chronic condition, ongoing/uncontrolled, orders placed ?

## 2021-05-01 NOTE — Assessment & Plan Note (Addendum)
Patient also presents with left great toe acute and traumatic pain following stepping on a toy while at home roughly 2.5 weeks ago, did note bruising and swelling.  Treatment to date is included supportive care, has noted gradual improvement in her symptoms since onset.  She states that she has been able to ambulate in normal shoes without any pain, only noting discomfort with deep "bending of her toes ".  Of note she does give a history of previous injury to the same toe. ? ?Examination today reveals normal findings from inspection standpoint, nontender throughout the foot and ankle save for first IP joint medially, no crepitus with passive flexion/extension at this joint though severe pain, no laxity with valgus/varus stressing. ? ?Given the timeline since injury, reassuring examination and history, I have advised weightbearing as tolerated, not pushing through pain, dedicated x-rays, and she will return for follow-up in 1 month.  Pending x-rays and symptoms, can escalate to postop shoe.  Rx vitamin D sent. ?

## 2021-05-01 NOTE — Progress Notes (Signed)
?  ? ?  Primary Care / Sports Medicine Office Visit ? ?Patient Information:  ?Patient ID: Denise Walker, female DOB: 1990-06-23 Age: 31 y.o. MRN: [id number]  ? ?Denise Walker is a pleasant 31 y.o. female presenting with the following: ? ?Chief Complaint  ?Patient presents with  ? Establish Care  ?  Has order for A1C from Neuro.   ? Toe Injury  ?  Thinks she broke her left big toe 2.5weeks ago. States it was bruised now just hurts with any touch.   ? ? ?Vitals:  ? 05/01/21 0820  ?BP: 128/78  ?Pulse: 88  ?SpO2: 98%  ? ?Vitals:  ? 05/01/21 0820  ?Weight: 213 lb (96.6 kg)  ?Height: 4\' 11"  (1.499 m)  ? ?Body mass index is 43.02 kg/m?. ? ?No results found.  ? ?Independent interpretation of notes and tests performed by another provider:  ? ?None ? ?Procedures performed:  ? ?None ? ?Pertinent History, Exam, Impression, and Recommendations:  ? ?PCOS (polycystic ovarian syndrome) ?Chronic condition, has previously been diagnosed and assessed by OB/GYN via [facility] clinic.  Unfortunately, her longstanding physician has left the practice.  She is currently without an OB/GYN, offered referral for the same and she is awaiting finding a new physician prior to new referral. ? ?She is interested in management, the multifactorial nature of this condition was reviewed and treatment strategies focused on risk reduction.  Initial management targeted towards risk stratification labs, plan to discuss glycemic control/weight management strategies at her return for annual physical. ? ?Chronic condition, ongoing/uncontrolled, orders placed ? ?Chronic pain of right knee ?X-rays ordered, will evaluate at her return ? ?Injury of left toe ?Patient also presents with left great toe acute and traumatic pain following stepping on a toy while at home roughly 2.5 weeks ago, did note bruising and swelling.  Treatment to date is included supportive care, has noted gradual improvement in her symptoms since onset.  She states that she has been able  to ambulate in normal shoes without any pain, only noting discomfort with deep "bending of her toes ".  Of note she does give a history of previous injury to the same toe. ? ?Examination today reveals normal findings from inspection standpoint, nontender throughout the foot and ankle save for first IP joint medially, no crepitus with passive flexion/extension at this joint though severe pain, no laxity with valgus/varus stressing. ? ?Given the timeline since injury, reassuring examination and history, I have advised weightbearing as tolerated, not pushing through pain, dedicated x-rays, and she will return for follow-up in 1 month.  Pending x-rays and symptoms, can escalate to postop shoe.  Rx vitamin D sent.  ? ?Orders & Medications ?Meds ordered this encounter  ?Medications  ? Vitamin D, Ergocalciferol, (DRISDOL) 1.25 MG (50000 UNIT) CAPS capsule  ?  Sig: Take 1 capsule (50,000 Units total) by mouth every 7 (seven) days. Take for 8 total doses(weeks)  ?  Dispense:  8 capsule  ?  Refill:  0  ? ?Orders Placed This Encounter  ?Procedures  ? DG Knee Complete 4 Views Right  ? DG Foot Complete Left  ? Apo A1 + B + Ratio  ? Lipid panel  ? Hepatitis C antibody  ?  ? ?Return in about 4 weeks (around 05/29/2021) for Annual Physical.  ?  ? ?Montel Culver, MD ? ? Primary Care Sports Medicine ?[facility] ?[place]  ? ?

## 2021-05-01 NOTE — Assessment & Plan Note (Signed)
X-rays ordered, will evaluate at her return ?

## 2021-05-01 NOTE — Patient Instructions (Signed)
-   Obtain labs today ?- Obtain x-rays today ?- Perform activity as tolerated using the left great toe symptoms as a guide (do not push through pain) ?- Can review coverage information on [email or web address] ?- Return for annual physical in 1 month ?- Contact our office for any questions between now and then ?

## 2021-05-02 LAB — APO A1 + B + RATIO
Apolipo. B/A-1 Ratio: 0.7 ratio — ABNORMAL HIGH (ref 0.0–0.6)
Apolipoprotein A-1: 132 mg/dL (ref 116–209)
Apolipoprotein B: 92 mg/dL — ABNORMAL HIGH (ref <90)

## 2021-05-02 LAB — LIPID PANEL
Chol/HDL Ratio: 4 ratio (ref 0.0–4.4)
Cholesterol, Total: 183 mg/dL (ref 100–199)
HDL: 46 mg/dL (ref >39)
LDL Chol Calc (NIH): 110 mg/dL — ABNORMAL HIGH (ref 0–99)
Triglycerides: 155 mg/dL — ABNORMAL HIGH (ref 0–149)
VLDL Cholesterol Cal: 27 mg/dL (ref 5–40)

## 2021-05-02 LAB — HEPATITIS C ANTIBODY: Hep C Virus Ab: NONREACTIVE

## 2021-05-07 ENCOUNTER — Ambulatory Visit
Admission: RE | Admit: 2021-05-07 | Discharge: 2021-05-07 | Disposition: A | Discharge status: Home / Self Care | Payer: Medicaid Other | Source: Ambulatory Visit | Attending: Student | Admitting: Student

## 2021-05-07 ENCOUNTER — Other Ambulatory Visit: Payer: Self-pay

## 2021-05-07 DIAGNOSIS — M5412 Radiculopathy, cervical region: Secondary | ICD-10-CM | POA: Diagnosis not present

## 2021-05-08 ENCOUNTER — Telehealth: Payer: Self-pay

## 2021-05-08 LAB — FETAL NONSTRESS TEST

## 2021-05-08 NOTE — Telephone Encounter (Signed)
Pt calling; thinks someone else's test got put into her MyChart; an NST from today at 10:10am; pt doesn't want to get charged for it; hasn't been preg since 2021.  Please check into this and call her back.  [phone] ?

## 2021-05-15 DIAGNOSIS — M25512 Pain in left shoulder: Secondary | ICD-10-CM | POA: Diagnosis not present

## 2021-05-15 DIAGNOSIS — G4486 Cervicogenic headache: Secondary | ICD-10-CM | POA: Diagnosis not present

## 2021-05-15 DIAGNOSIS — G43009 Migraine without aura, not intractable, without status migrainosus: Secondary | ICD-10-CM | POA: Diagnosis not present

## 2021-05-15 DIAGNOSIS — G8929 Other chronic pain: Secondary | ICD-10-CM | POA: Diagnosis not present

## 2021-05-15 NOTE — Telephone Encounter (Signed)
Patient aware it was her record from 02/09/2018 for NST when she was here for pregnancy

## 2021-05-24 ENCOUNTER — Ambulatory Visit: Payer: Self-pay | Admitting: Family Medicine

## 2021-05-25 ENCOUNTER — Other Ambulatory Visit: Payer: Self-pay | Admitting: Family Medicine

## 2021-05-25 DIAGNOSIS — S99922A Unspecified injury of left foot, initial encounter: Secondary | ICD-10-CM

## 2021-05-25 NOTE — Telephone Encounter (Signed)
Requested medication (s) are due for refill today:   Yes ? ?Requested medication (s) are on the active medication list:   Yes ? ?Future visit scheduled:   Yes in 1 wk ? ? ?Last ordered: 3/211/2023 #8, 0 refills ? ?Non delegated reason returned  ? ?Requested Prescriptions  ?Pending Prescriptions Disp Refills  ? Vitamin D, Ergocalciferol, (DRISDOL) 1.25 MG (50000 UNIT) CAPS capsule [Pharmacy Med Name: VITAMIN D2 1.25MG (50,000 UNIT)] 4 capsule 1  ?  Sig: Take 1 capsule (50,000 Units total) by mouth every 7 (seven) days. Take for 8 total doses(weeks)  ?  ? Endocrinology:  Vitamins - Vitamin D Supplementation 2 Failed - 05/25/2021  8:31 AM  ?  ?  Failed - Manual Review: Route requests for 50,000 IU strength to the provider  ?  ?  Failed - Vitamin D in normal range and within 360 days  ?  No results found for: [id number], [id number], [id number], [place], [place], [place], [place], [place], [place], [place], [id number]  ?  ?  ?  Passed - Ca in normal range and within 360 days  ?  Calcium  ?Date Value Ref Range Status  ?06/08/2020 9.4 8.7 - 10.2 mg/dL Final  ? ?Calcium, Total  ?Date Value Ref Range Status  ?10/11/2013 9.0 8.5 - 10.1 mg/dL Final  ?  ?  ?  ?  Passed - Valid encounter within last 12 months  ?  Recent Outpatient Visits   ? ?      ? 3 weeks ago PCOS (polycystic ovarian syndrome)  ? [facility] Montel Culver, MD  ? ?  ?  ?Future Appointments   ? ?        ? In 1 week Zigmund Daniel Earley Abide, MD [facility], [place]  ? ?  ? ? ?  ?  ?  ? ?

## 2021-06-04 ENCOUNTER — Encounter: Payer: Self-pay | Admitting: Family Medicine

## 2021-06-04 ENCOUNTER — Ambulatory Visit (INDEPENDENT_AMBULATORY_CARE_PROVIDER_SITE_OTHER): Payer: 59 | Admitting: Family Medicine

## 2021-06-04 VITALS — BP 118/80 | HR 88 | Ht 59.0 in | Wt 214.6 lb

## 2021-06-04 DIAGNOSIS — M79672 Pain in left foot: Secondary | ICD-10-CM

## 2021-06-04 DIAGNOSIS — F419 Anxiety disorder, unspecified: Secondary | ICD-10-CM | POA: Diagnosis not present

## 2021-06-04 DIAGNOSIS — M25561 Pain in right knee: Secondary | ICD-10-CM

## 2021-06-04 DIAGNOSIS — G8929 Other chronic pain: Secondary | ICD-10-CM

## 2021-06-04 DIAGNOSIS — R7303 Prediabetes: Secondary | ICD-10-CM | POA: Diagnosis not present

## 2021-06-04 DIAGNOSIS — F32A Depression, unspecified: Secondary | ICD-10-CM

## 2021-06-04 DIAGNOSIS — R69 Illness, unspecified: Secondary | ICD-10-CM | POA: Diagnosis not present

## 2021-06-04 DIAGNOSIS — E782 Mixed hyperlipidemia: Secondary | ICD-10-CM | POA: Diagnosis not present

## 2021-06-04 MED ORDER — DULOXETINE HCL 30 MG PO CPEP: ORAL_CAPSULE | ORAL | 0 refills | Status: DC

## 2021-06-04 NOTE — Assessment & Plan Note (Signed)
Elevated A1c to prediabetic range, lifestyle modifications stressed. ?

## 2021-06-04 NOTE — Assessment & Plan Note (Signed)
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  From a treatment standpoint I have advised formal physical therapy, initiation of duloxetine, and we can follow-up on this issue in the future. ? ?Chronic condition, symptomatic, independent interpretation x-rays, Rx management ?

## 2021-06-04 NOTE — Patient Instructions (Addendum)
-   Start duloxetine 30 mg capsule nightly x2 weeks ?- Continue citalopram 10 mg x 2 weeks then discontinue ?- After 2 weeks, increase duloxetine to 60 mg (2 capsules) nightly until follow-up ?- Start physical therapy for knee and foot ?- Review information regarding lifestyle changes for prediabetes and preventing high cholesterol ?- Return for follow-up in 6 weeks, contact us for any questions between now and then ?

## 2021-06-04 NOTE — Progress Notes (Signed)
?  ? ?  Primary Care / Sports Medicine Office Visit ? ?Patient Information:  ?Patient ID: Denise Walker, female DOB: 1990-08-28 Age: 31 y.o. MRN: [id number]  ? ?Denise Walker is a pleasant 31 y.o. female presenting with the following: ? ?Chief Complaint  ?Patient presents with  ? lab follow up  ? ? ?Vitals:  ? 06/04/21 1043  ?BP: 118/80  ?Pulse: 88  ?SpO2: 99%  ? ?Vitals:  ? 06/04/21 1043  ?Weight: 214 lb 9.6 oz (97.3 kg)  ?Height: 4\' 11"  (1.499 m)  ? ?Body mass index is 43.34 kg/m?. ? ?  ? ?Independent interpretation of notes and tests performed by another provider:  ? 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  No acute osseous processes noted ? ?Independent interpretation of right knee x-rays reveals subtle medial tibiofemoral narrowing and associated subchondral sclerosis, Evidence of dynamic maltracking, and subtle lateral patellar tilt on sunrise view. ? ?Procedures performed:  ? ?None ? ?Pertinent History, Exam, Impression, and Recommendations:  ? ?Problem List Items Addressed This Visit   ? ?  ? Other  ? Chronic pain of right knee  ?  Chronic issue with ongoing symptomatology, given her reassuring x-rays, I have advised physical therapy to focus on biomechanics, knee stabilization, and we will initiate duloxetine for chronic musculoskeletal pain. ? ?Chronic condition, symptomatic, independent interpretation x-rays, Rx management ? ?  ?  ? Relevant Medications  ? DULoxetine (CYMBALTA) 30 MG capsule  ? Chronic foot pain, left  ?  Patient returns for follow-up to left great toe pain, x-rays do reveal old/chronic degenerative changes throughout the foot with localization about the first IP, insertion of  the peroneals, Achilles, and plantar fascia origin.  From a treatment standpoint I have advised formal physical therapy, initiation of duloxetine, and we can follow-up on this issue in the future. ? ?Chronic condition, symptomatic, independent interpretation x-rays, Rx management ?  ?  ? Relevant Medications  ? lamoTRIgine (LAMICTAL) 25 MG tablet  ? cyclobenzaprine (FLEXERIL) 10 MG tablet  ? DULoxetine (CYMBALTA) 30 MG capsule  ? Mixed hyperlipidemia - Primary  ?  Noted on recent blood work, no need for pharmacotherapy at this stage but I have strongly encouraged lifestyle/dietary modifications, information provided to the patient in that regard today. ? ?  ?  ? Prediabetes  ?  Elevated A1c to prediabetic range, lifestyle modifications stressed. ? ?  ?  ? Anxiety and depression  ?  Chronic issue, plan for initiation of duloxetine, will bridge with Celexa x2 weeks then discontinue the Celexa and continue to titrate duloxetine. ? ?  ?  ? Relevant Medications  ? DULoxetine (CYMBALTA) 30 MG capsule  ?  ? ?Orders & Medications ?Meds ordered this encounter  ?Medications  ? DULoxetine (CYMBALTA) 30 MG capsule  ?  Sig: Take 1 capsule (30 mg total) by mouth every evening for 14 days, THEN 2 capsules (60 mg total) every evening.  ?  Dispense:  74 capsule  ?  Refill:  0  ? ?No orders of the defined types were placed in this encounter. ?  ? ?Return in about 6 weeks (around 07/16/2021).  ?  ? ?09/15/2021, MD ? ? Primary Care Sports Medicine ?[facility] ?[facility]  ? ?

## 2021-06-04 NOTE — Assessment & Plan Note (Signed)
Chronic issue, plan for initiation of duloxetine, will bridge with Celexa x2 weeks then discontinue the Celexa and continue to titrate duloxetine. ?

## 2021-06-04 NOTE — Assessment & Plan Note (Signed)
Chronic issue with ongoing symptomatology, given her reassuring x-rays, I have advised physical therapy to focus on biomechanics, knee stabilization, and we will initiate duloxetine for chronic musculoskeletal pain. ? ?Chronic condition, symptomatic, independent interpretation x-rays, Rx management ?

## 2021-06-04 NOTE — Assessment & Plan Note (Signed)
Noted on recent blood work, no need for pharmacotherapy at this stage but I have strongly encouraged lifestyle/dietary modifications, information provided to the patient in that regard today. ?

## 2021-06-06 ENCOUNTER — Ambulatory Visit (INDEPENDENT_AMBULATORY_CARE_PROVIDER_SITE_OTHER): Payer: 59 | Admitting: Psychiatry

## 2021-06-06 ENCOUNTER — Encounter: Payer: Self-pay | Admitting: Psychiatry

## 2021-06-06 VITALS — BP 129/83 | HR 91 | Temp 98.6°F | Wt 214.6 lb

## 2021-06-06 DIAGNOSIS — F331 Major depressive disorder, recurrent, moderate: Secondary | ICD-10-CM | POA: Diagnosis not present

## 2021-06-06 DIAGNOSIS — F4312 Post-traumatic stress disorder, chronic: Secondary | ICD-10-CM | POA: Diagnosis not present

## 2021-06-06 DIAGNOSIS — F411 Generalized anxiety disorder: Secondary | ICD-10-CM

## 2021-06-06 DIAGNOSIS — F5105 Insomnia due to other mental disorder: Secondary | ICD-10-CM

## 2021-06-06 DIAGNOSIS — R69 Illness, unspecified: Secondary | ICD-10-CM | POA: Diagnosis not present

## 2021-06-06 DIAGNOSIS — F900 Attention-deficit hyperactivity disorder, predominantly inattentive type: Secondary | ICD-10-CM

## 2021-06-06 MED ORDER — AMPHETAMINE-DEXTROAMPHETAMINE 30 MG PO TABS: 15.0000 mg | ORAL_TABLET | Freq: Two times a day (BID) | ORAL | 0 refills | Status: DC

## 2021-06-06 NOTE — Progress Notes (Signed)
?[facility] OP Progress Note ? ?06/06/2021 12:12 PM ?Denise Walker  ?MRN:  [id number] ? ?Chief Complaint:  ?Chief Complaint  ?Patient presents with  ? Follow-up: 31 year old Caucasian female with history of MDD, GAD, ADHD, multiple psychosocial stressors presented for medication management.  ? ?HPI: Denise Walker is a 31 year old Caucasian female, stay-at-home mom, lives in [place], has a history of MDD, GAD, history of ADHD, insomnia, PTSD was evaluated in office today. ? ?Patient reports she has been in and out of emergency department as well as pediatrician's office since her child has been sick.  This has caused a lot of stress and anxiety. ? ?Patient however reports overall she has been trying to manage and cope okay. ? ?She denies any significant sadness or crying spells although she feels sad about her child's health. ? ?Patient also reports she has been struggling with her own problems, has bulging disc as well as back pain.  She is going to start physical therapy soon. ? ?Patient also reports her primary care provider would like to start her on Cymbalta to address her pain.  She wanted to discuss with Probation officer before starting it.  If she is going to start the Cymbalta she will be weaned off of the Celexa. ? ?Patient reports sleep is overall okay as long as her children does not interrupted. ? ?Patient is currently taking the Adderall only as needed and when she takes it it helps.  Denies side effects. ? ?Patient denies any suicidality, homicidality or perceptual disturbances. ? ?Patient denies any other concerns today. ? ?Visit Diagnosis:  ?  ICD-10-CM   ?1. Moderate episode of recurrent major depressive disorder (HCC)  F33.1   ? improving  ?  ?2. GAD (generalized anxiety disorder)  F41.1   ?  ?3. Insomnia due to mental disorder  F51.05   ? mood  ?  ?4. ADHD (attention deficit hyperactivity disorder), inattentive type  F90.0 amphetamine-dextroamphetamine (ADDERALL) 30 MG tablet  ?  ?5. Chronic post-traumatic  stress disorder (PTSD)  F43.12   ?  ? ? ?Past Psychiatric History: Reviewed past psychiatric history from progress note on 06/29/2020.  Past trials of medications like Lexapro, Zoloft, Celexa, Trintellix, Concerta, Ritalin, Wellbutrin.  Neuropsychological testing completed-11/08/2020-diagnosed with ADHD. ? ?Past Medical History:  ?Past Medical History:  ?Diagnosis Date  ? Anxiety   ? Back pain   ? low back pain with lumbar radiculitis  ? Bilateral shoulder pain   ? BRCA negative 04/2018  ? MyRisk neg; IBIS=8.9%/riskscore=6.7%  ? Eclampsia   ? Family history of ovarian cancer   ? Gestational diabetes   ? Migraine headache   ? PCOS (polycystic ovarian syndrome)   ?  ?Past Surgical History:  ?Procedure Laterality Date  ? CESAREAN SECTION N/A 07/15/2014  ? Procedure: CESAREAN SECTION;  Surgeon: Gae Dry, MD;  Location: [facility] ORS;  Service: Obstetrics;  Laterality: N/A;  ? CESAREAN SECTION N/A 02/10/2018  ? Procedure: CESAREAN SECTION;  Surgeon: Will Bonnet, MD;  Location: [facility] ORS;  Service: Obstetrics;  Laterality: N/A;  ? CESAREAN SECTION N/A 12/31/2018  ? Procedure: CESAREAN SECTION;  Surgeon: [name], MD;  Location: [facility] ORS;  Service: Obstetrics;  Laterality: N/A;  ? CESAREAN SECTION    ? HERNIA REPAIR  10/2013  ? WISDOM TOOTH EXTRACTION    ? ? ?Family Psychiatric History: Reviewed family psychiatric history from progress note on 06/29/2020. ? ?Family History:  ?Family History  ?Problem Relation Age of Onset  ? Bipolar disorder Mother   ?  Ovarian cancer Mother   ? Arthritis Mother   ? Ovarian cancer Maternal Aunt   ? Diabetes Paternal Aunt   ? Breast cancer Maternal Grandmother   ? Diabetes Paternal Grandfather   ? Uterine cancer Cousin   ? ADD / ADHD Daughter   ? ODD Daughter   ? ? ?Social History: Reviewed social history from progress note on 06/29/2020. ?Social History  ? ?Socioeconomic History  ? Marital status: Married  ?  Spouse name: Einar Pheasant  ? Number of children: 3  ? Years of education: high  school  ? Highest education level: Not on file  ?Occupational History  ? Not on file  ?Tobacco Use  ? Smoking status: Never  ? Smokeless tobacco: Never  ?Vaping Use  ? Vaping Use: Never used  ?Substance and Sexual Activity  ? Alcohol use: Yes  ?  Comment: occasional wine cooler  ? Drug use: Never  ? Sexual activity: Yes  ?  Birth control/protection: I.U.D.  ?  Comment: will discuss with MD  ?Other Topics Concern  ? Not on file  ?Social History Narrative  ? Not on file  ? ?Social Determinants of Health  ? ?Financial Resource Strain: Not on file  ?Food Insecurity: Not on file  ?Transportation Needs: Not on file  ?Physical Activity: Not on file  ?Stress: Not on file  ?Social Connections: Not on file  ? ? ?Allergies:  ?Allergies  ?Allergen Reactions  ? Fish Allergy Shortness Of Breath  ? Shellfish Allergy Shortness Of Breath  ? Rizatriptan Other (See Comments)  ?  "Head pressure, neck locking up"  ? ? ?Metabolic Disorder Labs: ?Lab Results  ?Component Value Date  ? HGBA1C 5.5 06/08/2020  ? ?Lab Results  ?Component Value Date  ? PROLACTIN 8.8 03/27/2017  ? ?Lab Results  ?Component Value Date  ? CHOL 183 05/01/2021  ? TRIG 155 (H) 05/01/2021  ? HDL 46 05/01/2021  ? CHOLHDL 4.0 05/01/2021  ? LDLCALC 110 (H) 05/01/2021  ? [place] 96 06/08/2020  ? ?Lab Results  ?Component Value Date  ? TSH 4.350 09/22/2019  ? TSH 2.170 03/27/2017  ? ? ?Therapeutic Level Labs: ?No results found for: LITHIUM ?No results found for: VALPROATE ?No components found for:  CBMZ ? ?Current Medications: ?Current Outpatient Medications  ?Medication Sig Dispense Refill  ? Butalbital-APAP-Caffeine 50-325-40 MG capsule Take by mouth.    ? clotrimazole-betamethasone (LOTRISONE) cream Apply topically 2 (two) times daily.    ? cyclobenzaprine (FLEXERIL) 10 MG tablet Take 1 tablet by mouth as needed.    ? DULoxetine (CYMBALTA) 30 MG capsule Take 1 capsule (30 mg total) by mouth every evening for 14 days, THEN 2 capsules (60 mg total) every evening. 74 capsule  0  ? hydrOXYzine (ATARAX) 25 MG tablet Take 1-2 tablets (25-50 mg total) by mouth daily as needed for anxiety. 180 tablet 0  ? lamoTRIgine (LAMICTAL) 200 MG tablet Take 1 tablet (200 mg total) by mouth daily. 90 tablet 0  ? lamoTRIgine (LAMICTAL) 25 MG tablet Take by mouth.    ? levonorgestrel (MIRENA) 20 MCG/24HR IUD by Intrauterine route.    ? naproxen (NAPROSYN) 500 MG tablet 1 po bid prn    ? omeprazole (PRILOSEC) 40 MG capsule Take 1 capsule (40 mg total) by mouth daily. 90 capsule 0  ? Vitamin D, Ergocalciferol, (DRISDOL) 1.25 MG (50000 UNIT) CAPS capsule Take 1 capsule (50,000 Units total) by mouth every 7 (seven) days. Take for 8 total doses(weeks) 8 capsule 0  ? amphetamine-dextroamphetamine (  ADDERALL) 30 MG tablet Take 0.5 tablets by mouth 2 (two) times daily. Take 0.5 tablets by mouth at 7 AM and 1 PM 30 tablet 0  ? ?No current facility-administered medications for this visit.  ? ? ? ?Musculoskeletal: ?Strength & Muscle Tone: within normal limits ?Gait & Station: normal ?Patient leans: N/A ? ?Psychiatric Specialty Exam: ?Review of Systems  ?Musculoskeletal:  Positive for back pain.  ?Psychiatric/Behavioral:  Positive for sleep disturbance. The patient is nervous/anxious.   ?All other systems reviewed and are negative.  ?Blood pressure 129/83, pulse 91, temperature 98.6 ?F (37 ?C), temperature source Temporal, weight 214 lb 9.6 oz (97.3 kg).Body mass index is 43.34 kg/m?.  ?General Appearance: Casual  ?Eye Contact:  Fair  ?Speech:  Clear and Coherent  ?Volume:  Normal  ?Mood:  Anxious  ?Affect:  Congruent  ?Thought Process:  Goal Directed and Descriptions of Associations: Intact  ?Orientation:  Full (Time, Place, and Person)  ?Thought Content: Logical   ?Suicidal Thoughts:  No  ?Homicidal Thoughts:  No  ?Memory:  Immediate;   Fair ?Recent;   Fair ?Remote;   Fair  ?Judgement:  Fair  ?Insight:  Fair  ?Psychomotor Activity:  Normal  ?Concentration:  Concentration: Fair and Attention Span: Fair  ?Recall:  Fair   ?Fund of Knowledge: Fair  ?Language: Fair  ?Akathisia:  No  ?Handed:  Left  ?AIMS (if indicated): done, 0  ?Assets:  Communication Skills ?Desire for Improvement ?Housing ?Intimacy ?Social Support ?Tra

## 2021-06-07 ENCOUNTER — Telehealth: Payer: Self-pay | Admitting: Licensed Clinical Social Worker

## 2021-06-07 ENCOUNTER — Ambulatory Visit: Payer: 59

## 2021-06-07 NOTE — Telephone Encounter (Signed)
Pt called initially to request more frequent visits.  Pt has appt scheduled in June.  Informed pt that clinician will place her on a call list and call her if there is a cancellation or NS appt. Pt reflects understanding and is appreciative.  ?

## 2021-06-12 DIAGNOSIS — M5416 Radiculopathy, lumbar region: Secondary | ICD-10-CM | POA: Diagnosis not present

## 2021-06-12 DIAGNOSIS — M542 Cervicalgia: Secondary | ICD-10-CM | POA: Diagnosis not present

## 2021-06-12 DIAGNOSIS — Z6841 Body Mass Index (BMI) 40.0 and over, adult: Secondary | ICD-10-CM | POA: Diagnosis not present

## 2021-06-12 DIAGNOSIS — M25812 Other specified joint disorders, left shoulder: Secondary | ICD-10-CM | POA: Diagnosis not present

## 2021-06-14 ENCOUNTER — Ambulatory Visit: Payer: 59

## 2021-06-20 NOTE — Therapy (Signed)
?OUTPATIENT PHYSICAL THERAPY NECK EVALUATION ? ? ?Patient Name: Denise Walker ?MRN: [id number] ?DOB:03/09/1990, 31 y.o., female ?Today's Date: 06/21/2021 ? ? PT End of Session - 06/21/21 1306   ? ? Visit Number 1   ? Number of Visits 17   ? Date for PT Re-Evaluation 08/16/21   ? Authorization Type eval: 06/21/21   ? PT Start Time 1015   ? PT Stop Time 1100   ? PT Time Calculation (min) 45 min   ? Activity Tolerance Patient tolerated treatment well   ? Behavior During Therapy [facility] for tasks assessed/performed   ? ?  ?  ? ?  ? ? ?Past Medical History:  ?Diagnosis Date  ? Anxiety   ? Back pain   ? low back pain with lumbar radiculitis  ? Bilateral shoulder pain   ? BRCA negative 04/2018  ? MyRisk neg; IBIS=8.9%/riskscore=6.7%  ? Eclampsia   ? Family history of ovarian cancer   ? Gestational diabetes   ? Migraine headache   ? PCOS (polycystic ovarian syndrome)   ? ?Past Surgical History:  ?Procedure Laterality Date  ? CESAREAN SECTION N/A 07/15/2014  ? Procedure: CESAREAN SECTION;  Surgeon: Gae Dry, MD;  Location: [facility] OR[facility];  Service: Obstetrics;  Laterality: N/A;  ? CESAREAN SECTION N/A 02/10/2018  ? Procedure: CESAREAN SECTION;  Surgeon: Will Bonnet, MD;  Location: [facility] OR[facility];  Service: Obstetrics;  Laterality: N/A;  ? CESAREAN SECTION N/A 12/31/2018  ? Procedure: CESAREAN SECTION;  Surgeon: [name], MD;  Location: [facility] OR[facility];  Service: Obstetrics;  Laterality: N/A;  ? CESAREAN SECTION    ? HERNIA REPAIR  10/2013  ? WISDOM TOOTH EXTRACTION    ? ?Patient Active Problem List  ? Diagnosis Date Noted  ? Mixed hyperlipidemia 06/04/2021  ? Prediabetes 06/04/2021  ? Anxiety and depression 06/04/2021  ? PCOS (polycystic ovarian syndrome) 05/01/2021  ? Chronic pain of right knee 05/01/2021  ? Chronic foot pain, left 05/01/2021  ? Bacterial infection 01/21/2021  ? History of influenza 01/21/2021  ? Bronchitis 01/21/2021  ? 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 Chronic  post-traumatic stress disorder (PTSD) 12/21/2020  ? MDD (major depressive disorder), recurrent, in full remission ([place]) 11/15/2020  ? 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 MDD (major depressive disorder), recurrent, in partial remission ([place]) 10/02/2020  ? 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 Attention and concentration deficit 08/31/2020  ? Moderate episode of recurrent major depressive disorder ([place]) 06/29/2020  ? High risk medication use 06/29/2020  ? History of ADHD 06/29/2020  ? Uterine scar from previous cesarean delivery affecting pregnancy 12/31/2018  ? Anti-D antibodies present during pregnancy 04-Aug-2018  ? BRCA gene mutation negative 05/04/2018  ? History of cesarean delivery 02/10/2018  ? Rh negative state in antepartum period 08/23/2017  ? History of eclampsia 07/22/2017  ? Migraines 04/09/2017  ? GAD (generalized anxiety disorder) 12/01/2014  ? ? ?PCP: Montel Culver, MD ? ?REFERRING PROVIDER: Doyle Askew, MD ? ?REFERRING DIAGNOSIS: Neck pain ? ?THERAPY DIAG: Cervicalgia ? ?ONSET DATE: 06/22/2018 (approximate) ? ?FOLLOW UP APPT WITH PROVIDER: Yes  ? ? ?SUBJECTIVE:                                                                                                                                                                                        ? ?  Chief Complaint: Neck pain ? ?Pertinent History ?Pt reports neck pain for 2-3 years. Insidious onset with no history of trauma to head/neck. Initially she thought the neck pain was related to her migraines but the migraines eventually were well managed and the neck pain continued. Pain radiates into the L upper arm however she experiences tingling in the entire L hand. She has chronic L shoulder pain and has difficulty lifting her kids, occasionally feeling a "catch" in her shoulder. Pt has received steroid injections in her left shoulder with benefit but has never had  any neck injections. She has chronic low back pain and has had lumbar injections. Pt reports intermittent migraines (6-7/month) and she is not currently taking any prophylactic migraine medications however does utilize rescue medications. Neurology wanted her to switch to [facility] but the cost was prohibitive. Per patient she is waiting to hear from neurology regarding insurance coverage/assistance. She gets a visual aura with her migraines and her triggers include noises, bright lights (TV and sunlight), and seasonal allergies. No known food triggers but pt has never kept a food diary or paid attention to any correlation with her diet. She had a CT scan of her head on 03/30/15 which was negative for any acute abnormalities per medical record. Recent cervical MRI showed mild cervical spondylosis. Mild multilevel disc degeneration, greatest at C4-C5 and C5-C6. No more than mild relative spinal canal narrowing. No significant foraminal stenosis. Nonspecific straightening of the expected cervical lordosis. ? ?Pain:  ?Pain Intensity: Present: 2/10, Best: 1/10 ?Pain location: Bilateral suboccipital region radiating down sides of neck ?Pain Quality: aching ?Radiating: Yes  ?Numbness/Tingling: Yes ?Focal Weakness: Yes, L shoulder weakness but unclear if it is related to neck  ?Aggravating factors: End range cervical rotation, sitting in the recliner ?Relieving factors: Really hot bath, TENS unit, trigger point release from husband helps, neck support ?24-hour pain behavior: Worsens as day progresses ?History of prior neck injury, pain, surgery, or therapy: None ?Follow-up appointment with MD: Yes, 07/12/19 with ortho for trigger point injections ?Dominant hand: left ?Imaging: Yes, see history ?Prior level of function: Independent ?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 ?Precautions: None ? ?Weight Bearing Restrictions: No ? ?Patient Goals: Decrease pain ? ? ?OBJECTIVE:  ? ?Patient Surveys  ?FOTO  56, predicted improvement to 63; ?NDI: To be completed at next session; ? ? ?Cognition ?Patient is oriented to person, place, and time.  ?Recent memory is intact.  ?Remote memory is intact.  ?Attention span and concentration are intact.  ?Expressive speech is intact.  ?Patient's fund of knowledge is within normal limits for educational level. ?  ?  ?Gross Musculoskeletal Assessment ?Tremor: None ?Bulk: Normal ?Tone: Normal ? ?Gait ?Deferred full gait assessment at this time; ? ?Posture ?Forward head and rounded shoulders in sitting with mild thoracic kyphosis; ? ? ?AROM ? ?AROM (Normal range in degrees) AROM ?06/21/2021  ?Cervical  ?Flexion (50) 55*  ?Extension (80) 38*  ?Right lateral flexion (45) 40  ?Left lateral flexion (45) 40  ?Right rotation (85) 58*  ?Left rotation (85) 54*  ?(* = pain; Blank rows = not tested) ? ?PROM: Deferred ? ?MMT ? ?MMT (out of 5) Right ?06/21/2021 Left ?06/21/2021  ?Cervical (isometric)  ?Flexion WNL  ?Extension WNL  ?Lateral Flexion WNL WNL  ?Rotation WNL WNL  ?    ?Shoulder   ?Flexion 5 4+*  ?Extension    ?Abduction 5 5  ?Internal rotation (seated) 5 5  ?Horizontal abduction (seated) 5  5  ?Horizontal adduction    ?Lower Trapezius    ?Rhomboids    ?    ?Elbow  ?Flexion 5 5  ?Extension 5 5  ?Pronation    ?Supination    ?    ?Wrist  ?Flexion 5 5  ?Extension 5 5  ?Radial deviation    ?Ulnar deviation    ?    ?MCP  ?Flexion 5 5  ?Extension 5 5  ?Abduction    ?Adduction    ?(* = pain; Blank rows = not tested) ? ?Sensation ?Deferred, to be performed at follow-up visit; ? ?Reflexes ?Deferred ? ? ?Palpation ? ?Location LEFT  RIGHT           ?Suboccipitals 2 2  ?Cervical paraspinals 2 2  ?Upper Trapezius 2 2  ?Levator Scapulae 1 1  ?Rhomboid Major/Minor 1 1  ?(Blank rows = not tested) ?Graded on 0-4 scale (0 = no pain, 1 = pain, 2 = pain with wincing/grimacing/flinching, 3 = pain with withdrawal, 4 = unwilling to allow palpation), (Blank rows = not tested) ? ? ?Repeated  Movements ?Deferred ? ? ?Passive Accessory Intervertebral Motion ?Pt denies reproduction of neck pain with CPA C2-T1 however is tender to pressure on her spinous processes. She reports pain with pressure along cervical paraspinals

## 2021-06-21 ENCOUNTER — Ambulatory Visit: Payer: 59 | Attending: Orthopedic

## 2021-06-21 ENCOUNTER — Encounter: Payer: 59 | Admitting: Psychology

## 2021-06-21 DIAGNOSIS — M542 Cervicalgia: Secondary | ICD-10-CM | POA: Insufficient documentation

## 2021-06-21 DIAGNOSIS — G8929 Other chronic pain: Secondary | ICD-10-CM | POA: Diagnosis not present

## 2021-06-21 DIAGNOSIS — M25512 Pain in left shoulder: Secondary | ICD-10-CM | POA: Diagnosis not present

## 2021-06-21 DIAGNOSIS — M6281 Muscle weakness (generalized): Secondary | ICD-10-CM | POA: Diagnosis not present

## 2021-06-25 ENCOUNTER — Telehealth: Payer: 59 | Admitting: Physician Assistant

## 2021-06-25 DIAGNOSIS — J208 Acute bronchitis due to other specified organisms: Secondary | ICD-10-CM

## 2021-06-25 DIAGNOSIS — B9689 Other specified bacterial agents as the cause of diseases classified elsewhere: Secondary | ICD-10-CM

## 2021-06-25 MED ORDER — LEVOCETIRIZINE DIHYDROCHLORIDE 5 MG PO TABS: 5.0000 mg | ORAL_TABLET | Freq: Every evening | ORAL | 0 refills | Status: DC

## 2021-06-25 MED ORDER — DOXYCYCLINE HYCLATE 100 MG PO TABS: 100.0000 mg | ORAL_TABLET | Freq: Two times a day (BID) | ORAL | 0 refills | Status: DC

## 2021-06-25 MED ORDER — PROMETHAZINE-DM 6.25-15 MG/5ML PO SYRP: 5.0000 mL | ORAL_SOLUTION | Freq: Four times a day (QID) | ORAL | 0 refills | Status: DC | PRN

## 2021-06-25 NOTE — Progress Notes (Signed)
?Virtual Visit Consent  ? ?Denise Walker, you are scheduled for a virtual visit with a [facility] provider today. Just as with appointments in the office, your consent must be obtained to participate. Your consent will be active for this visit and any virtual visit you may have with one of our providers in the next 365 days. If you have a MyChart account, a copy of this consent can be sent to you electronically. ? ?As this is a virtual visit, video technology does not allow for your provider to perform a traditional examination. This may limit your provider's ability to fully assess your condition. If your provider identifies any concerns that need to be evaluated in person or the need to arrange testing (such as labs, EKG, etc.), we will make arrangements to do so. Although advances in technology are sophisticated, we cannot ensure that it will always work on either your end or our end. If the connection with a video visit is poor, the visit may have to be switched to a telephone visit. With either a video or telephone visit, we are not always able to ensure that we have a secure connection. ? ?By engaging in this virtual visit, you consent to the provision of healthcare and authorize for your insurance to be billed (if applicable) for the services provided during this visit. Depending on your insurance coverage, you may receive a charge related to this service. ? ?I need to obtain your verbal consent now. Are you willing to proceed with your visit today? Denise Walker has provided verbal consent on 06/25/2021 for a virtual visit (video or telephone). [name], PA-C ? ?Date: 06/25/2021 1:41 PM ? ?Virtual Visit via Video Note  ? ?ILeeanne Rio, connected with  Denise Walker  ([id number], [birth year]) on 06/25/21 at  1:30 PM EDT by a video-enabled telemedicine application and verified that I am speaking with the correct person using two identifiers. ? ?Location: ?Patient: Virtual Visit Location  Patient: Home ?Provider: Virtual Visit Location Provider: Home Office ?  ?I discussed the limitations of evaluation and management by telemedicine and the availability of in person appointments. The patient expressed understanding and agreed to proceed.   ? ?History of Present Illness: ?Denise Walker is a 31 y.o. who identifies as a female who was assigned female at birth, and is being seen today for URI symptoms starting around 3 weeks ago. At that time her 59 year old child had a cold which quickly moved around to the other members of the family. She notes at the time have chest congestion and cough, nasal congestion with fatigue. Originally improved with resolution of the sinus symptoms but chest congestion and cough have continued. Now cough is more productive with thicker phlegm. Today coughed up mucous with two little specks of blood that has her concerned. Denies fever, chills, aches. Denies SOB outside of coughing spells.  ? ?Has not taken any medication in the past few days. Prior to that taking OTC cough and cold symptoms.  ? ? ?HPI: HPI  ?Problems:  ?Patient Active Problem List  ? Diagnosis Date Noted  ? Mixed hyperlipidemia 06/04/2021  ? Prediabetes 06/04/2021  ? Anxiety and depression 06/04/2021  ? PCOS (polycystic ovarian syndrome) 05/01/2021  ? Chronic pain of right knee 05/01/2021  ? Chronic foot pain, left 05/01/2021  ? Bacterial infection 01/21/2021  ? History of influenza 01/21/2021  ? Bronchitis 01/21/2021  ? 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 Chronic post-traumatic stress disorder (  PTSD) 12/21/2020  ? MDD (major depressive disorder), recurrent, in full remission ([place]) 11/15/2020  ? 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 MDD (major depressive disorder), recurrent, in partial remission ([place]) 10/02/2020  ? 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 Attention and concentration deficit  08/31/2020  ? Moderate episode of recurrent major depressive disorder ([place]) 06/29/2020  ? High risk medication use 06/29/2020  ? History of ADHD 06/29/2020  ? Uterine scar from previous cesarean delivery affecting pregnancy 12/31/2018  ? Anti-D antibodies present during pregnancy 07/11/2018  ? BRCA gene mutation negative 05/04/2018  ? History of cesarean delivery 02/10/2018  ? Rh negative state in antepartum period 08/23/2017  ? History of eclampsia 07/22/2017  ? Migraines 04/09/2017  ? GAD (generalized anxiety disorder) 12/01/2014  ?  ?Allergies:  ?Allergies  ?Allergen Reactions  ? Fish Allergy Shortness Of Breath  ? Shellfish Allergy Shortness Of Breath  ? Rizatriptan Other (See Comments)  ?  "Head pressure, neck locking up"  ? ?Medications:  ?Current Outpatient Medications:  ?  doxycycline (VIBRA-TABS) 100 MG tablet, Take 1 tablet (100 mg total) by mouth 2 (two) times daily., Disp: 14 tablet, Rfl: 0 ?  levocetirizine (XYZAL) 5 MG tablet, Take 1 tablet (5 mg total) by mouth every evening., Disp: 30 tablet, Rfl: 0 ?  promethazine-dextromethorphan (PROMETHAZINE-DM) 6.25-15 MG/5ML syrup, Take 5 mLs by mouth 4 (four) times daily as needed for cough., Disp: 118 mL, Rfl: 0 ?  amphetamine-dextroamphetamine (ADDERALL) 30 MG tablet, Take 0.5 tablets by mouth 2 (two) times daily. Take 0.5 tablets by mouth at 7 AM and 1 PM, Disp: 30 tablet, Rfl: 0 ?  Butalbital-APAP-Caffeine 50-325-40 MG capsule, Take by mouth., Disp: , Rfl:  ?  clotrimazole-betamethasone (LOTRISONE) cream, Apply topically 2 (two) times daily., Disp: , Rfl:  ?  cyclobenzaprine (FLEXERIL) 10 MG tablet, Take 1 tablet by mouth as needed., Disp: , Rfl:  ?  DULoxetine (CYMBALTA) 30 MG capsule, Take 1 capsule (30 mg total) by mouth every evening for 14 days, THEN 2 capsules (60 mg total) every evening., Disp: 74 capsule, Rfl: 0 ?  hydrOXYzine (ATARAX) 25 MG tablet, Take 1-2 tablets (25-50 mg total) by mouth daily as needed for anxiety., Disp: 180 tablet, Rfl: 0 ?   lamoTRIgine (LAMICTAL) 200 MG tablet, Take 1 tablet (200 mg total) by mouth daily., Disp: 90 tablet, Rfl: 0 ?  lamoTRIgine (LAMICTAL) 25 MG tablet, Take by mouth., Disp: , Rfl:  ?  levonorgestrel (MIRENA) 20 MCG/24HR IUD, by Intrauterine route., Disp: , Rfl:  ?  naproxen (NAPROSYN) 500 MG tablet, 1 po bid prn, Disp: , Rfl:  ?  omeprazole (PRILOSEC) 40 MG capsule, Take 1 capsule (40 mg total) by mouth daily., Disp: 90 capsule, Rfl: 0 ?  Vitamin D, Ergocalciferol, (DRISDOL) 1.25 MG (50000 UNIT) CAPS capsule, Take 1 capsule (50,000 Units total) by mouth every 7 (seven) days. Take for 8 total doses(weeks), Disp: 8 capsule, Rfl: 0 ? ?Observations/Objective: ?Patient is well-developed, well-nourished in no acute distress.  ?Resting comfortably at home.  ?Head is normocephalic, atraumatic.  ?No labored breathing. ?Speech is clear and coherent with logical content.  ?Patient is alert and oriented at baseline.  ? ?Assessment and Plan: ?1. Acute bacterial bronchitis ?- levocetirizine (XYZAL) 5 MG tablet; Take 1 tablet (5 mg total) by mouth every evening.  Dispense: 30 tablet; Refill: 0 ?- promethazine-dextromethorphan (PROMETHAZINE-DM) 6.25-15 MG/5ML syrup; Take 5 mLs by mouth 4 (four) times daily as needed for  cough.  Dispense: 118 mL; Refill: 0 ?- doxycycline (VIBRA-TABS) 100 MG tablet; Take 1 tablet (100 mg total) by mouth 2 (two) times daily.  Dispense: 14 tablet; Refill: 0 ? ?Rx Doxycycline.  Increase fluids.  Rest.  Saline nasal spray.  Probiotic.  Mucinex as directed.  Humidifier in bedroom. Xyzal once daily. Promethazine-DM per orders.  Call or return to clinic if symptoms are not improving. ? ? ?Follow Up Instructions: ?I discussed the assessment and treatment plan with the patient. The patient was provided an opportunity to ask questions and all were answered. The patient agreed with the plan and demonstrated an understanding of the instructions.  A copy of instructions were sent to the patient via MyChart unless  otherwise noted below.  ? ?The patient was advised to call back or seek an in-person evaluation if the symptoms worsen or if the condition fails to improve as anticipated. ? ?Time:  ?I spent 10 minutes wit

## 2021-06-25 NOTE — Patient Instructions (Signed)
?Denise Walker, thank you for joining [name], PA-C for today's virtual visit.  While this provider is not your primary care provider (PCP), if your PCP is located in our provider database this encounter information will be shared with them immediately following your visit. ? ?Consent: ?(Patient) Denise Walker provided verbal consent for this virtual visit at the beginning of the encounter. ? ?Current Medications: ? ?Current Outpatient Medications:  ?  amphetamine-dextroamphetamine (ADDERALL) 30 MG tablet, Take 0.5 tablets by mouth 2 (two) times daily. Take 0.5 tablets by mouth at 7 AM and 1 PM, Disp: 30 tablet, Rfl: 0 ?  Butalbital-APAP-Caffeine 50-325-40 MG capsule, Take by mouth., Disp: , Rfl:  ?  clotrimazole-betamethasone (LOTRISONE) cream, Apply topically 2 (two) times daily., Disp: , Rfl:  ?  cyclobenzaprine (FLEXERIL) 10 MG tablet, Take 1 tablet by mouth as needed., Disp: , Rfl:  ?  DULoxetine (CYMBALTA) 30 MG capsule, Take 1 capsule (30 mg total) by mouth every evening for 14 days, THEN 2 capsules (60 mg total) every evening., Disp: 74 capsule, Rfl: 0 ?  hydrOXYzine (ATARAX) 25 MG tablet, Take 1-2 tablets (25-50 mg total) by mouth daily as needed for anxiety., Disp: 180 tablet, Rfl: 0 ?  lamoTRIgine (LAMICTAL) 200 MG tablet, Take 1 tablet (200 mg total) by mouth daily., Disp: 90 tablet, Rfl: 0 ?  lamoTRIgine (LAMICTAL) 25 MG tablet, Take by mouth., Disp: , Rfl:  ?  levonorgestrel (MIRENA) 20 MCG/24HR IUD, by Intrauterine route., Disp: , Rfl:  ?  naproxen (NAPROSYN) 500 MG tablet, 1 po bid prn, Disp: , Rfl:  ?  omeprazole (PRILOSEC) 40 MG capsule, Take 1 capsule (40 mg total) by mouth daily., Disp: 90 capsule, Rfl: 0 ?  Vitamin D, Ergocalciferol, (DRISDOL) 1.25 MG (50000 UNIT) CAPS capsule, Take 1 capsule (50,000 Units total) by mouth every 7 (seven) days. Take for 8 total doses(weeks), Disp: 8 capsule, Rfl: 0  ? ?Medications ordered in this encounter:  ?No orders of the defined types were  placed in this encounter. ?  ? ?*If you need refills on other medications prior to your next appointment, please contact your pharmacy* ? ?Follow-Up: ?Call back or seek an in-person evaluation if the symptoms worsen or if the condition fails to improve as anticipated. ? ?Other Instructions ?Take antibiotic (Doxycycline) as directed.  Increase fluids.  Get plenty of rest. Use Mucinex for congestion. Take the promethazine-dm cough syrup as directed. Take a daily probiotic (I recommend Align or Culturelle, but even Activia Yogurt may be beneficial).  A humidifier placed in the bedroom may offer some relief for a dry, scratchy throat of nasal irritation.  Read information below on acute bronchitis. Please call or return to clinic if symptoms are not improving. ? ?Acute Bronchitis ?Bronchitis is when the airways that extend from the windpipe into the lungs get red, puffy, and painful (inflamed). Bronchitis often causes thick spit (mucus) to develop. This leads to a cough. A cough is the most common symptom of bronchitis. ?In acute bronchitis, the condition usually begins suddenly and goes away over time (usually in 2 weeks). Smoking, allergies, and asthma can make bronchitis worse. Repeated episodes of bronchitis may cause more lung problems. ? ?HOME CARE ?Rest. ?Drink enough fluids to keep your pee (urine) clear or pale yellow (unless you need to limit fluids as told by your doctor). ?Only take over-the-counter or prescription medicines as told by your doctor. ?Avoid smoking and secondhand smoke. These can make bronchitis worse. If you are a smoker, think  about using nicotine gum or skin patches. Quitting smoking will help your lungs heal faster. ?Reduce the chance of getting bronchitis again by: ?Washing your hands often. ?Avoiding people with cold symptoms. ?Trying not to touch your hands to your mouth, nose, or eyes. ?Follow up with your doctor as told. ? ?GET HELP IF: ?Your symptoms do not improve after 1 week of  treatment. Symptoms include: ?Cough. ?Fever. ?Coughing up thick spit. ?Body aches. ?Chest congestion. ?Chills. ?Shortness of breath. ?Sore throat. ? ?GET HELP RIGHT AWAY IF:  ?You have an increased fever. ?You have chills. ?You have severe shortness of breath. ?You have bloody thick spit (sputum). ?You throw up (vomit) often. ?You lose too much body fluid (dehydration). ?You have a severe headache. ?You faint. ? ?MAKE SURE YOU:  ?Understand these instructions. ?Will watch your condition. ?Will get help right away if you are not doing well or get worse. ?Document Released: 07/17/2007 Document Revised: 09/30/2012 Document Reviewed: 07/21/2012 ?ExitCare? Patient Information ?2015 [employer]. This information is not intended to replace advice given to you by your health care provider. Make sure you discuss any questions you have with your health care provider. ? ?[email or web address]  ? ? ?If you have been instructed to have an in-person evaluation today at a local Urgent Care facility, please use the link below. It will take you to a list of all of our available [facility], including address, phone number and hours of operation. Please do not delay care.  ?[facility] ? ?If you or a family member do not have a primary care provider, use the link below to schedule a visit and establish care. When you choose a [facility] primary care physician or advanced practice provider, you gain a long-term partner in health. ?Find a Primary Care Provider ? ?Learn more about [facility]'s in-office and virtual care options: ? - Get Care Now  ?

## 2021-06-26 NOTE — Therapy (Signed)
OUTPATIENT PHYSICAL THERAPY NECK TREATMENT   Patient Name: Denise Walker MRN: [id number] DOB:[birth year], 31 y.o., female Today's Date: 06/28/2021   PT End of Session - 06/28/21 1110     Visit Number 2    Number of Visits 17    Date for PT Re-Evaluation 08/16/21    Authorization Type eval: 06/21/21    PT Start Time 1105    PT Stop Time 1145    PT Time Calculation (min) 40 min    Activity Tolerance Patient tolerated treatment well    Behavior During Therapy [facility] for tasks assessed/performed              Past Medical History:  Diagnosis Date   Anxiety    Back pain    low back pain with lumbar radiculitis   Bilateral shoulder pain    BRCA negative 04/2018   MyRisk neg; IBIS=8.9%/riskscore=6.7%   Eclampsia    Family history of ovarian cancer    Gestational diabetes    Migraine headache    PCOS (polycystic ovarian syndrome)    Past Surgical History:  Procedure Laterality Date   CESAREAN SECTION N/A 07/15/2014   Procedure: CESAREAN SECTION;  Surgeon: Gae Dry, MD;  Location: [facility] ORS;  Service: Obstetrics;  Laterality: N/A;   CESAREAN SECTION N/A 02/10/2018   Procedure: CESAREAN SECTION;  Surgeon: Will Bonnet, MD;  Location: [facility] ORS;  Service: Obstetrics;  Laterality: N/A;   CESAREAN SECTION N/A 12/31/2018   Procedure: CESAREAN SECTION;  Surgeon: [name], MD;  Location: [facility] ORS;  Service: Obstetrics;  Laterality: N/A;   CESAREAN SECTION     HERNIA REPAIR  10/2013   WISDOM TOOTH EXTRACTION     Patient Active Problem List   Diagnosis Date Noted   Mixed hyperlipidemia 06/04/2021   Prediabetes 06/04/2021   Anxiety and depression 06/04/2021   PCOS (polycystic ovarian syndrome) 05/01/2021   Chronic pain of right knee 05/01/2021   Chronic foot pain, left 05/01/2021   Bacterial infection 01/21/2021   History of influenza 01/21/2021   Bronchitis 01/21/2021   ADHD (attention deficit hyperactivity disorder), inattentive type 12/21/2020   Chronic  post-traumatic stress disorder (PTSD) 12/21/2020   MDD (major depressive disorder), recurrent, in full remission ([place]) 11/15/2020   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   MDD (major depressive disorder), recurrent, in partial remission ([place]) 10/02/2020   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   Attention and concentration deficit 08/31/2020   Moderate episode of recurrent major depressive disorder ([place]) 06/29/2020   High risk medication use 06/29/2020   History of ADHD 06/29/2020   Uterine scar from previous cesarean delivery affecting pregnancy 12/31/2018   Anti-D antibodies present during pregnancy 08/04/2018   BRCA gene mutation negative 05/04/2018   History of cesarean delivery 02/10/2018   Rh negative state in antepartum period 08/23/2017   History of eclampsia 07/22/2017   Migraines 04/09/2017   GAD (generalized anxiety disorder) 12/01/2014    PCP: Montel Culver, MD  REFERRING PROVIDER: Charlynne Cousins, NP  REFERRING DIAGNOSIS: Neck pain  THERAPY DIAG: Cervicalgia  Chronic left shoulder pain  Muscle weakness (generalized)  ONSET DATE: 06/22/2018 (approximate)  FOLLOW UP APPT WITH PROVIDER: Yes   FROM INITIAL EVALUATION  SUBJECTIVE:  Chief Complaint: Neck pain  Pertinent History Pt reports neck pain for 2-3 years. Insidious onset with no history of trauma to head/neck. Initially she thought the neck pain was related to her migraines but the migraines eventually were well managed and the neck pain continued. Pain radiates into the L upper arm however she experiences tingling in the entire L hand. She has chronic L shoulder pain and has difficulty lifting her kids, occasionally feeling a "catch" in her shoulder. Pt  has received steroid injections in her left shoulder with benefit but has never had any neck injections. She has chronic low back pain and has had lumbar injections. Pt reports intermittent migraines (6-7/month) and she is not currently taking any prophylactic migraine medications however does utilize rescue medications. Neurology wanted her to switch to [facility] but the cost was prohibitive. Per patient she is waiting to hear from neurology regarding insurance coverage/assistance. She gets a visual aura with her migraines and her triggers include noises, bright lights (TV and sunlight), and seasonal allergies. No known food triggers but pt has never kept a food diary or paid attention to any correlation with her diet. She had a CT scan of her head on 03/30/15 which was negative for any acute abnormalities per medical record. Recent cervical MRI showed mild cervical spondylosis. Mild multilevel disc degeneration, greatest at C4-C5 and C5-C6. No more than mild relative spinal canal narrowing. No significant foraminal stenosis. Nonspecific straightening of the expected cervical lordosis.  Pain:  Pain Intensity: Present: 2/10, Best: 1/10 Pain location: Bilateral suboccipital region radiating down sides of neck Pain Quality: aching Radiating: Yes  Numbness/Tingling: Yes Focal Weakness: Yes, L shoulder weakness but unclear if it is related to neck  Aggravating factors: End range cervical rotation, sitting in the recliner Relieving factors: Really hot bath, TENS unit, trigger point release from husband helps, neck support 24-hour pain behavior: Worsens as day progresses History of prior neck injury, pain, surgery, or therapy: None Follow-up appointment with MD: Yes, 07/12/19 with ortho for trigger point injections Dominant hand: left Imaging: Yes, see history Prior level of function: Independent 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  Recent memory is intact.  Remote memory is intact.  Attention span and concentration are intact.  Expressive speech is intact.  Patient's fund of knowledge is within normal limits for educational level.     Gross Musculoskeletal Assessment Tremor: None Bulk: Normal Tone: Normal  Gait Deferred full gait assessment at this time;  Posture Forward head and rounded shoulders in sitting with mild thoracic kyphosis;   AROM  AROM (Normal range in degrees) AROM 06/28/2021  Cervical  Flexion (50) 55*  Extension (80) 38*  Right lateral flexion (45) 40  Left lateral flexion (45) 40  Right rotation (85) 58*  Left rotation (85) 54*  (* = pain; Blank rows = not tested)  PROM: Deferred  MMT  MMT (out of 5) Right 06/28/2021 Left 06/28/2021  Cervical (isometric)  Flexion WNL  Extension WNL  Lateral Flexion WNL WNL  Rotation WNL WNL      Shoulder   Flexion 5 4+*  Extension    Abduction 5 5  Internal rotation (seated) 5 5  Horizontal abduction (seated) 5  5  Horizontal adduction    Lower Trapezius    Rhomboids        Elbow  Flexion 5 5  Extension 5 5  Pronation    Supination        Wrist  Flexion 5 5  Extension 5 5  Radial deviation    Ulnar deviation        MCP  Flexion 5 5  Extension 5 5  Abduction    Adduction    (* = pain; Blank rows = not tested)  Sensation Deferred, to be performed at follow-up visit;  Reflexes Deferred   Palpation  Location LEFT  RIGHT           Suboccipitals 2 2  Cervical paraspinals 2 2  Upper Trapezius 2 2  Levator Scapulae 1 1  Rhomboid Major/Minor 1 1  (Blank rows = not tested) Graded on 0-4 scale (0 = no pain, 1 = pain, 2 = pain with wincing/grimacing/flinching, 3 = pain with withdrawal, 4 =  unwilling to allow palpation), (Blank rows = not tested)   Repeated Movements Deferred   Passive Accessory Intervertebral Motion Pt denies reproduction of neck pain with CPA C2-T1 however is tender to pressure on her spinous processes. She reports pain with pressure along cervical paraspinals so unable to assess unilateral passive accessory mobility.     SPECIAL TESTS Spurlings A (ipsilateral lateral flexion/axial compression): R: Negative L: Negative Spurlings B (ipsilateral lateral flexion/contralateral rotation/axial compression): R: Negative L: Negative Distraction Test: Positive for discomfort  Hoffman Sign (cervical cord compression): R: Negative L: Negative ULTT Median: R: Not examined L: Not examined ULTT Ulnar: R: Not examined L: Not examined ULTT Radial: R: Not examined L: Not examined    TODAY'S TREATMENT    Electrical Stimulation  Custom symmetric biphasic estim applied to cervical paraspinals/occipital nerves at 100Hz  and 400 microseconds using one channel at pt tolerated intensity of 4.0 x 15 minutes with heat pack applied to posterior cervical spine during therapy session.     Manual Therapy  Prone grade I-II CPA mobilizations C2-C4, 30s/bout x two bouts/level; Prone grade I-II UPA mobilizations C2-C4, 30s/bout x two bouts/level bilaterally; Prone C1 lateral mass moblizations, grade I, 20s/bout x 2 bouts bilaterally; Supine bilateral upper trap and lateral flexion x 45s each bilaterally; Supine suboccipital release x 2 minutes; During manual therapy applied custom symmetric biphasic estim applied on forehead over eyes for supraorbital branch of trigeminal nerve stimulation at 100Hz  and 400 microseconds using one channel at pt tolerated intensity of 4.0 x 15 minutes.   Mechanical Traction After, and separate to, manual therapy performed mechanical cervical traction in supine at 30 degree pull angle, 20# x 9 minutes.  Patient denies any pain during or after  traction in her neck;     PATIENT EDUCATION:  Education details: Plan of care Person educated: Patient Education method: Explanation and handout Education comprehension: verbalized understanding   HOME EXERCISE PROGRAM: None currently, stretches to be issued at next session.   ASSESSMENT:  CLINICAL IMPRESSION: Patient reports some soreness after dry needling from initial evaluation. Her daughter is going to be admitted to the hospital for a bowel impaction over the weekend so she would like to defer dry needling today until next week. Initiated manual techniques including upper cervical mobilizations and stretches. Utilized estim over upper cervical spine to target occipital nerve and on forehead to target supraorbital branch of trigeminal nerve during session. Utilized mechanical traction due to radicular UE symptoms. Assessed patient's home  TENS unit and it is hard to determine output frequency, waveform, and pulse duration. Pt encouraged to follow-up as scheduled. Will issue HEP at next appointment and have pt complete NDI. Pt will benefit from PT services to address deficits in pain in order to return to full function at home.   REHAB POTENTIAL: Good  CLINICAL DECISION MAKING: Evolving/moderate complexity  EVALUATION COMPLEXITY: Moderate   GOALS: Goals reviewed with patient? Yes  SHORT TERM GOALS: Target date: 07/26/2021  Pt will be independent with HEP to improve strength and range of motion as well as decrease neck pain to improve pain-free function at home and work. Baseline:  Goal status: INITIAL   LONG TERM GOALS: Target date: 08/23/2021  Pt will increase FOTO to at least 63 to demonstrate significant improvement in function at home and work related to neck pain  Baseline: 06/21/21: 56 Goal status: INITIAL  2.  Pt will decrease worst neck pain by at least 2 points on the NPRS in order to demonstrate clinically significant reduction in neck pain. Baseline:  Goal  status: INITIAL  3.  Pt will decrease NDI score by at least 19% in order demonstrate clinically significant reduction in neck pain/disability.     Baseline: 06/21/21: To be completed at next session; Goal status: INITIAL   PLAN: PT FREQUENCY: 2x/week  PT DURATION: 8 weeks  PLANNED INTERVENTIONS: Therapeutic exercises, Therapeutic activity, Neuromuscular re-education, Balance training, Gait training, Patient/Family education, Joint manipulation, Joint mobilization, Vestibular training, Canalith repositioning, Dry Needling, Electrical stimulation, Spinal manipulation, Spinal mobilization, Cryotherapy, Moist heat, Taping, Traction, Ultrasound, Ionotophoresis 25m/ml Dexamethasone, and Manual therapy  PLAN FOR NEXT SESSION: Pt to complete NDI, ask about worst pain, upper cervical mobilizations, STM, cervical and upper quarter strengthening;  JLyndel SafeHuprich PT, DPT, GCS  Cristella Stiver 06/28/2021, 12:44 PM

## 2021-06-28 ENCOUNTER — Ambulatory Visit: Payer: 59

## 2021-06-28 DIAGNOSIS — M6281 Muscle weakness (generalized): Secondary | ICD-10-CM

## 2021-06-28 DIAGNOSIS — M542 Cervicalgia: Secondary | ICD-10-CM

## 2021-06-28 DIAGNOSIS — G8929 Other chronic pain: Secondary | ICD-10-CM | POA: Diagnosis not present

## 2021-06-28 DIAGNOSIS — M25512 Pain in left shoulder: Secondary | ICD-10-CM | POA: Diagnosis not present

## 2021-06-30 ENCOUNTER — Other Ambulatory Visit: Payer: Self-pay | Admitting: Family Medicine

## 2021-06-30 DIAGNOSIS — F419 Anxiety disorder, unspecified: Secondary | ICD-10-CM

## 2021-06-30 DIAGNOSIS — G8929 Other chronic pain: Secondary | ICD-10-CM

## 2021-07-03 NOTE — Telephone Encounter (Signed)
Requested medication (s) are due for refill today:   Yes  Requested medication (s) are on the active medication list:   Yes  Future visit scheduled:   No   Last ordered: 06/04/2021 #74, 0 refills  Returned because pharmacy requesting a 90 day supply and a DX Code   Requested Prescriptions  Pending Prescriptions Disp Refills   DULoxetine (CYMBALTA) 30 MG capsule [Pharmacy Med Name: DULOXETINE HCL DR 30 MG CAP] 180 capsule 1    Sig: TAKE 1 CAPSULE BY MOUTH EVERY EVENING FOR 14 DAYS, THEN 2 CAPSULES EVERY EVENING.     Psychiatry: Antidepressants - SNRI - duloxetine Failed - 06/30/2021 12:33 PM      Failed - Cr in normal range and within 360 days    Creatinine  Date Value Ref Range Status  10/11/2013 0.83 0.60 - 1.30 mg/dL Final   Creatinine, Ser  Date Value Ref Range Status  06/08/2020 1.09 (H) 0.57 - 1.00 mg/dL Final   Creatinine, Urine  Date Value Ref Range Status  12/18/2017 121 mg/dL Final         Failed - eGFR is 30 or above and within 360 days    EGFR (African American)  Date Value Ref Range Status  10/11/2013 >60  Final   GFR calc Af Amer  Date Value Ref Range Status  04/06/2020 83 >59 mL/min/1.73 Final    Comment:    **In accordance with recommendations from the NKF-ASN Task force,**   [facility] is in the process of updating its eGFR calculation to the   2021 CKD-EPI creatinine equation that estimates kidney function   without a race variable.    EGFR (Non-African Amer.)  Date Value Ref Range Status  10/11/2013 >60  Final    Comment:    eGFR values <54m/min/1.73 m2 may be an indication of chronic kidney disease (CKD). Calculated eGFR is useful in patients with stable renal function. The eGFR calculation will not be reliable in acutely ill patients when serum creatinine is changing rapidly. It is not useful in  patients on dialysis. The eGFR calculation may not be applicable to patients at the low and high extremes of body sizes, pregnant women, and  vegetarians.    GFR calc non Af Amer  Date Value Ref Range Status  04/06/2020 72 >59 mL/min/1.73 Final   eGFR  Date Value Ref Range Status  06/08/2020 70 >59 mL/min/1.73 Final         Passed - Completed PHQ-2 or PHQ-9 in the last 360 days      Passed - Last BP in normal range    BP Readings from Last 1 Encounters:  06/04/21 118/80         Passed - Valid encounter within last 6 months    Recent Outpatient Visits           [facility]   2 months ago PCOS (polycystic ovarian syndrome)   [facility]

## 2021-07-04 ENCOUNTER — Telehealth: Payer: Self-pay

## 2021-07-04 NOTE — Telephone Encounter (Signed)
pt called states that she wants to stop the cymbalta because she not doing well she feels drained and having more anxiety.

## 2021-07-04 NOTE — Telephone Encounter (Signed)
Returned call to patient.  Discussed with patient that she has not given the Cymbalta enough time.  It has only been 4 weeks since she was started on this medication.  Patient advised to stay on the medication if it is not causing her any side effects.  Patient advised to give the medication more time.  Patient would like to stay on this medication until her next appointment.

## 2021-07-17 ENCOUNTER — Ambulatory Visit (INDEPENDENT_AMBULATORY_CARE_PROVIDER_SITE_OTHER): Payer: 59 | Admitting: Licensed Clinical Social Worker

## 2021-07-17 DIAGNOSIS — F331 Major depressive disorder, recurrent, moderate: Secondary | ICD-10-CM

## 2021-07-17 DIAGNOSIS — F411 Generalized anxiety disorder: Secondary | ICD-10-CM

## 2021-07-17 NOTE — Progress Notes (Signed)
Virtual Visit via Video Note  I connected with [name] on 07/17/21 at 11:00 AM EDT by a video enabled telemedicine application and verified that I am speaking with the correct person using two identifiers.  Location: Patient: home Provider: remote office [place], [place])   I discussed the limitations of evaluation and management by telemedicine and the availability of in person appointments. The patient expressed understanding and agreed to proceed.   I discussed the assessment and treatment plan with the patient. The patient was provided an opportunity to ask questions and all were answered. The patient agreed with the plan and demonstrated an understanding of the instructions.   The patient was advised to call back or seek an in-person evaluation if the symptoms worsen or if the condition fails to improve as anticipated.  I provided 55 minutes of non-face-to-face time during this encounter.   Luverta Korte R Breshay Ilg, LCSW   THERAPIST PROGRESS NOTE  Session Time: 11-1155a  Participation Level: Active  Behavioral Response: Neat and Well GroomedAlertEuthymic  Type of Therapy: Individual Therapy  Treatment Goals addressed: Problem: Depression CCP Problem  1 Decrease depressive symptoms and improve levels of effective functioning Goal: LTG: Reduce frequency, intensity, and duration of depression symptoms as evidenced by: SSB input needed on appropriate metric Outcome: Progressing Goal: STG: @PREFFIRSTNAME @ will participate in at least 80% of scheduled individual psychotherapy sessions Outcome: Progressing Intervention: REVIEW PLEASE SKILLS (TREAT PHYSICAL ILLNESS, BALANCE EATING, AVOID MOOD-ALTERING SUBSTANCES, BALANCE SLEEP AND GET EXERCISE) WITH Intervention: Encourage self-care activities Intervention: Encourage verbalization of feelings/concerns/expectations Note: Pt reports overall stable mood   Problem: Anxiety Disorder Goal:  Reduce overall frequency, intensity,  and duration of the anxiety so that daily functioning is not impaired per pt self report 3 out of 5 sessions documented.   Outcome: Not Progressing Goal: Enhance ability to effectively cope with the full variety of life's worries and anxiety per self report 3 out of 5 sessions documented  Outcome: Not Progressing Intervention: Assist with relaxation techniques, as appropriate (deep breathing exercises, meditation, guided imagery) Note: Reviewed and assisted with identifying interventions that have been helpful in the past Intervention: Assess emotional status and coping mechanisms Note: Assessed--pt still experiencing anxiety and struggling to find effective coping skills  Interventions: CBT; psychoeducational  Summary: Juliya Magill is a 31 y.o. female who presents with continuing symptoms related to depression and anxiety. Pt reports that overall mood is stable but she is feeling escalating anxiety compared to previous sessions.   Pt reports recent change from celexa to cymbalta which has triggered escalating anxiety for pt.   Patient reports that the health of her children is continuing to be a major stressor for her period patient reports that her oldest child recently had some stomach issues and is currently being treated at [facility]. Patient also reports that she received a diagnosis of pre diabetes, which is fuelling stress. Patient reports that there is a family history of diabetes and patient experience gestational diabetes throughout all three of her pregnancies. Patient also concerned about neck pain, and bulging discs in neck. Patient currently receiving physical therapy for shoulder and neck area and seeing a neurologist.  Allowed patient to explore anxiety triggers, and reviewed coping skills for managing anxiety and panic episodes. Patient reports that lot of her anxiety is fueled by hypothetical incidents involving her children getting hurt. Patient reports because she fears the  potential of her children getting hurt she will often not allow them to play on equipment or engage in other  activities where they could possibly get hurt. Patient feels that she's feeding into their anxiety because she's noticed that if she stands right beside of her children all of the time to protect them that that becomes the expectation. Encourage patient to continue recognizing these things in herself because it's part of her overall self-awareness. Discussed thinking outside of the comfort zone in the future, but currently we are going to focus on the anxiety at hand.  Continued recommendations are as follows: self care behaviors, positive social engagements, focusing on overall work/home/life balance, and focusing on positive physical and emotional wellness.  .   Suicidal/Homicidal: No  Therapist Response: Pt is continuing to apply interventions learned in session into daily life situations. Pt is currently on track to meet goals utilizing interventions mentioned above. Personal growth and progress noted. Treatment to continue as indicated.   Plan: Return again in 4 weeks. Follow up ASAP w/ psychiatrist (LCSW clinician sent message per pt request to psychiatrist about thoughts/feelings re: medication changes)  Diagnosis:  Encounter Diagnoses  Name Primary?   Moderate episode of recurrent major depressive disorder (HCC) Yes   GAD (generalized anxiety disorder)     Collaboration of Care: Other pt encouraged to continue care with psychiatrist of record, Dr. Jomarie Longs  Patient/Guardian was advised Release of Information must be obtained prior to any record release in order to collaborate their care with an outside provider. Patient/Guardian was advised if they have not already done so to contact the registration department to sign all necessary forms in order for [place] to release information regarding their care.   Consent: Patient/Guardian gives verbal consent for treatment and  assignment of benefits for services provided during this visit. Patient/Guardian expressed understanding and agreed to proceed.     Ernest Haber Braxson Hollingsworth, LCSW 07/17/2021

## 2021-07-17 NOTE — Plan of Care (Signed)
  Problem: Depression CCP Problem  1 Decrease depressive symptoms and improve levels of effective functioning Goal: LTG: Reduce frequency, intensity, and duration of depression symptoms as evidenced by: SSB input needed on appropriate metric Outcome: Progressing Goal: STG: @PREFFIRSTNAME @ will participate in at least 80% of scheduled individual psychotherapy sessions Outcome: Progressing Intervention: REVIEW PLEASE SKILLS (TREAT PHYSICAL ILLNESS, BALANCE EATING, AVOID MOOD-ALTERING SUBSTANCES, BALANCE SLEEP AND GET EXERCISE) WITH Intervention: Encourage self-care activities Intervention: Encourage verbalization of feelings/concerns/expectations Note: Pt reports overall stable mood   Problem: Anxiety Disorder Goal:  Reduce overall frequency, intensity, and duration of the anxiety so that daily functioning is not impaired per pt self report 3 out of 5 sessions documented.   Outcome: Not Progressing Goal: Enhance ability to effectively cope with the full variety of life's worries and anxiety per self report 3 out of 5 sessions documented  Outcome: Not Progressing Intervention: Assist with relaxation techniques, as appropriate (deep breathing exercises, meditation, guided imagery) Note: Reviewed and assisted with identifying interventions that have been helpful in the past Intervention: Assess emotional status and coping mechanisms Note: Assessed--pt still experiencing anxiety and struggling to find effective coping skills

## 2021-07-18 ENCOUNTER — Telehealth (INDEPENDENT_AMBULATORY_CARE_PROVIDER_SITE_OTHER): Payer: 59 | Admitting: Psychiatry

## 2021-07-18 ENCOUNTER — Encounter: Payer: Self-pay | Admitting: Psychiatry

## 2021-07-18 DIAGNOSIS — F331 Major depressive disorder, recurrent, moderate: Secondary | ICD-10-CM | POA: Diagnosis not present

## 2021-07-18 DIAGNOSIS — F4312 Post-traumatic stress disorder, chronic: Secondary | ICD-10-CM | POA: Diagnosis not present

## 2021-07-18 DIAGNOSIS — F900 Attention-deficit hyperactivity disorder, predominantly inattentive type: Secondary | ICD-10-CM

## 2021-07-18 DIAGNOSIS — F5105 Insomnia due to other mental disorder: Secondary | ICD-10-CM

## 2021-07-18 DIAGNOSIS — F411 Generalized anxiety disorder: Secondary | ICD-10-CM

## 2021-07-18 MED ORDER — DULOXETINE HCL 30 MG PO CPEP: 90.0000 mg | ORAL_CAPSULE | Freq: Every evening | ORAL | 1 refills | Status: DC

## 2021-07-18 MED ORDER — CLONAZEPAM 0.5 MG PO TABS: 0.5000 mg | ORAL_TABLET | Freq: Every day | ORAL | 0 refills | Status: DC | PRN

## 2021-07-18 NOTE — Progress Notes (Signed)
Virtual Visit via Video Note  I connected with Denise Walker on 07/18/21 at  3:30 PM EDT by a video enabled telemedicine application and verified that I am speaking with the correct person using two identifiers.  Location Provider Location : [facility] Patient Location : Home  Participants: Patient , Provider   I discussed the limitations of evaluation and management by telemedicine and the availability of in person appointments. The patient expressed understanding and agreed to proceed.   I discussed the assessment and treatment plan with the patient. The patient was provided an opportunity to ask questions and all were answered. The patient agreed with the plan and demonstrated an understanding of the instructions.   The patient was advised to call back or seek an in-person evaluation if the symptoms worsen or if the condition fails to improve as anticipated. [place] MD OP Progress Note  07/18/2021 4:01 PM Acquanetta Cabanilla  MRN:  [id number]  Chief Complaint:  Chief Complaint  Patient presents with   Follow-up: 31 year old Caucasian female with history of MDD, GAD, ADHD with worsening anxiety presented for medication management.   HPI: Denise Walker is a 31 year old Caucasian female, stay-at-home mom, lives in [facility] has a history of MDD, GAD, history of ADHD, insomnia, PTSD, was evaluated for medication management.  Patient's therapist contacted Probation officer regarding patient, patient with worsening anxiety symptoms.  Hence this appointment was scheduled.  Patient today reports since the past couple of weeks she has been feeling more and more anxious, overwhelmed, inability to cope with her anxiety symptoms.  She also has had panic attacks when she had to excuse herself from certain situations and activities.  This also has been frustrating for her .  She is currently on the Cymbalta, currently takes a 60 mg, dosage may have been increased 3 to 4 weeks ago.  Tolerating it well so far.  Patient reports  sleep is restless right now since her daughter needs her help, currently her daughter is going through an upper respiratory tract infection symptoms and may need breathing treatments.  Patient denies any suicidality, homicidality or perceptual disturbances.  Currently compliant on her medications like Lamictal.  Denies side effects.  Rarely takes the clonazepam.  Agreeable to continue to follow-up with her therapist and reports therapy sessions as beneficial.  Denies any other concerns today.  Visit Diagnosis:    ICD-10-CM   1. Moderate episode of recurrent major depressive disorder (HCC)  F33.1 DULoxetine (CYMBALTA) 30 MG capsule    2. GAD (generalized anxiety disorder)  F41.1 clonazePAM (KLONOPIN) 0.5 MG tablet    3. Insomnia due to mental disorder  F51.05    anxiety    4. ADHD (attention deficit hyperactivity disorder), inattentive type  F90.0     5. Chronic post-traumatic stress disorder (PTSD)  F43.12       Past Psychiatric History: Reviewed past psychiatric history from progress note on 06/29/2020.  Past trials of medications like Lexapro, Zoloft, Celexa, Trintellix, Concerta, Ritalin, Wellbutrin.  Neuropsychological testing completed-11/08/2020-diagnosed with ADHD.  Past Medical History:  Past Medical History:  Diagnosis Date   Anxiety    Back pain    low back pain with lumbar radiculitis   Bilateral shoulder pain    BRCA negative 04/2018   MyRisk neg; IBIS=8.9%/riskscore=6.7%   Eclampsia    Family history of ovarian cancer    Gestational diabetes    Migraine headache    PCOS (polycystic ovarian syndrome)     Past Surgical History:  Procedure Laterality Date  CESAREAN SECTION N/A 07/15/2014   Procedure: CESAREAN SECTION;  Surgeon: Gae Dry, MD;  Location: [facility] ORS;  Service: Obstetrics;  Laterality: N/A;   CESAREAN SECTION N/A 02/10/2018   Procedure: CESAREAN SECTION;  Surgeon: Will Bonnet, MD;  Location: [facility] ORS;  Service: Obstetrics;  Laterality:  N/A;   CESAREAN SECTION N/A 12/31/2018   Procedure: CESAREAN SECTION;  Surgeon: [name], MD;  Location: [facility] ORS;  Service: Obstetrics;  Laterality: N/A;   CESAREAN SECTION     HERNIA REPAIR  10/2013   WISDOM TOOTH EXTRACTION      Family Psychiatric History: Reviewed family psychiatric history from progress note on 06/29/2020.  Family History:  Family History  Problem Relation Age of Onset   Bipolar disorder Mother    Ovarian cancer Mother    Arthritis Mother    Ovarian cancer Maternal Aunt    Diabetes Paternal Aunt    Breast cancer Maternal Grandmother    Diabetes Paternal Grandfather    Uterine cancer Cousin    ADD / ADHD Daughter    ODD Daughter     Social History: Reviewed social history from progress note on 06/29/2020. Social History   Socioeconomic History   Marital status: Married    Spouse name: Einar Pheasant   Number of children: 3   Years of education: high school   Highest education level: Not on file  Occupational History   Not on file  Tobacco Use   Smoking status: Never   Smokeless tobacco: Never  Vaping Use   Vaping Use: Never used  Substance and Sexual Activity   Alcohol use: Yes    Comment: occasional wine cooler   Drug use: Never   Sexual activity: Yes    Birth control/protection: I.U.D.    Comment: will discuss with MD  Other Topics Concern   Not on file  Social History Narrative   Not on file   Social Determinants of Health   Financial Resource Strain: Not on file  Food Insecurity: Not on file  Transportation Needs: Not on file  Physical Activity: Inactive (12/27/2016)   Exercise Vital Sign    Days of Exercise per Week: 0 days    Minutes of Exercise per Session: 0 min  Stress: Stress Concern Present (12/27/2016)   [place]    Feeling of Stress : To some extent  Social Connections: Somewhat Isolated (12/27/2016)   Social Connection and Isolation Panel [NHANES]     Frequency of Communication with Friends and Family: More than three times a week    Frequency of Social Gatherings with Friends and Family: Three times a week    Attends Religious Services: Never    Active Member of Clubs or Organizations: No    Attends Archivist Meetings: Never    Marital Status: Married    Allergies:  Allergies  Allergen Reactions   Fish Allergy Shortness Of Breath   Shellfish Allergy Shortness Of Breath   Rizatriptan Other (See Comments)    "Head pressure, neck locking up"    Metabolic Disorder Labs: Lab Results  Component Value Date   HGBA1C 5.5 06/08/2020   Lab Results  Component Value Date   PROLACTIN 8.8 03/27/2017   Lab Results  Component Value Date   CHOL 183 05/01/2021   TRIG 155 (H) 05/01/2021   HDL 46 05/01/2021   CHOLHDL 4.0 05/01/2021   LDLCALC 110 (H) 05/01/2021   LDLCALC 96 06/08/2020   Lab Results  Component  Value Date   TSH 4.350 09/22/2019   TSH 2.170 03/27/2017    Therapeutic Level Labs: No results found for: "LITHIUM" No results found for: "VALPROATE" No results found for: "CBMZ"  Current Medications: Current Outpatient Medications  Medication Sig Dispense Refill   amphetamine-dextroamphetamine (ADDERALL) 30 MG tablet Take 0.5 tablets by mouth 2 (two) times daily. Take 0.5 tablets by mouth at 7 AM and 1 PM 30 tablet 0   Butalbital-APAP-Caffeine 50-325-40 MG capsule Take by mouth.     clonazePAM (KLONOPIN) 0.5 MG tablet Take 1 tablet (0.5 mg total) by mouth daily as needed for anxiety. 10 tablet 0   clotrimazole-betamethasone (LOTRISONE) cream Apply topically 2 (two) times daily.     doxycycline (VIBRA-TABS) 100 MG tablet Take 1 tablet (100 mg total) by mouth 2 (two) times daily. 14 tablet 0   hydrOXYzine (ATARAX) 25 MG tablet Take 1-2 tablets (25-50 mg total) by mouth daily as needed for anxiety. 180 tablet 0   lamoTRIgine (LAMICTAL) 200 MG tablet Take 1 tablet (200 mg total) by mouth daily. 90 tablet 0    lamoTRIgine (LAMICTAL) 25 MG tablet Take by mouth.     levocetirizine (XYZAL) 5 MG tablet Take 1 tablet (5 mg total) by mouth every evening. 30 tablet 0   levonorgestrel (MIRENA) 20 MCG/24HR IUD by Intrauterine route.     methocarbamol (ROBAXIN) 500 MG tablet Take 500 mg by mouth 3 (three) times daily.     omeprazole (PRILOSEC) 40 MG capsule Take 1 capsule (40 mg total) by mouth daily. 90 capsule 0   promethazine-dextromethorphan (PROMETHAZINE-DM) 6.25-15 MG/5ML syrup Take 5 mLs by mouth 4 (four) times daily as needed for cough. 118 mL 0   Vitamin D, Ergocalciferol, (DRISDOL) 1.25 MG (50000 UNIT) CAPS capsule Take 1 capsule (50,000 Units total) by mouth every 7 (seven) days. Take for 8 total doses(weeks) 8 capsule 0   DULoxetine (CYMBALTA) 30 MG capsule Take 3 capsules (90 mg total) by mouth every evening. 90 capsule 1   naproxen (NAPROSYN) 500 MG tablet 1 po bid prn (Patient not taking: Reported on 07/18/2021)     No current facility-administered medications for this visit.     Musculoskeletal: Strength & Muscle Tone:  UTA Gait & Station:  Seated Patient leans: N/A  Psychiatric Specialty Exam: Review of Systems  Psychiatric/Behavioral:  Positive for decreased concentration and sleep disturbance. The patient is nervous/anxious.   All other systems reviewed and are negative.   There were no vitals taken for this visit.There is no height or weight on file to calculate BMI.  General Appearance: Casual  Eye Contact:  Fair  Speech:  Normal Rate  Volume:  Normal  Mood:  Anxious  Affect:  Congruent  Thought Process:  Goal Directed and Descriptions of Associations: Intact  Orientation:  Full (Time, Place, and Person)  Thought Content: Logical   Suicidal Thoughts:  No  Homicidal Thoughts:  No  Memory:  Immediate;   Fair Recent;   Fair Remote;   Fair  Judgement:  Fair  Insight:  Fair  Psychomotor Activity:  Normal  Concentration:  Concentration: Fair and Attention Span: Fair  Recall:   [employer] of Knowledge: Fair  Language: Fair  Akathisia:  No  Handed:  Left  AIMS (if indicated): not done  Assets:  Communication Skills Desire for Improvement Housing Intimacy Social Support Transportation  ADL's:  Intact  Cognition: WNL  Sleep:   Restless due to her child being sick and needing her help.  Screenings: [place] Office Visit from 06/06/2021 in [place] Video Visit from 10/02/2020 in [place] Total Score 0 2      GAD-7    Flowsheet Row Video Visit from 07/18/2021 in [place] Office Visit from 06/04/2021 in [facility] Office Visit from 05/01/2021 in [facility] Video Visit from 04/17/2021 in [place] Office Visit from 09/07/2020 in [place]  Total GAD-7 Score _0 PHQ2-9    Flowsheet Row Video Visit from 07/18/2021 in [place] Office Visit from 06/06/2021 in [place] Office Visit from 06/04/2021 in [facility] Office Visit from 05/01/2021 in [facility] Video Visit from 04/17/2021 in [place]  PHQ-2 Total Score 0 4 0 2 0  PHQ-9 Total Score _1 --      Flowsheet Row Office Visit from 06/06/2021 in [place] Video Visit from 04/17/2021 in [place] Counselor from 03/23/2021 in [place] No Risk No Risk No Risk        Assessment and Plan: Juno Alers is a 31 year old Caucasian female, lives in [place], married, has a history of depression, anxiety, PCOS, chronic pain was evaluated by telemedicine today.  Patient is currently struggling with worsening anxiety, will benefit from the following plan.  Plan MDD in remission Increase Cymbalta to 90  mg p.o. daily. Lamotrigine 250 mg p.o. daily  GAD-unstable Increase Cymbalta to 90 mg p.o. daily Lamotrigine 250 mg p.o. daily Clonazepam 0.5 mg as needed for severe anxiety attacks-patient is currently limiting use. Reviewed and CPM. Hydroxyzine 25-50 mg p.o. daily as needed for anxiety. Continue CBT with Ms. Christina Hussami  ADHD-stable Adderall 30 mg p.o. daily in divided dosage.  She has been using the Adderall only as needed.  Does not use it every day.   Chronic PTSD-stable Continue CBT  Insomnia-unstable Currently sleep affected due to her child being sick. Continue sleep hygiene techniques.  Follow-up in clinic in 3 to 4 weeks or sooner if needed.  Collaboration of Care: Collaboration of Care: Referral or follow-up with counselor/therapist AEB encouraged to follow up with her therapist.  Patient/Guardian was advised Release of Information must be obtained prior to any record release in order to collaborate their care with an outside provider. Patient/Guardian was advised if they have not already done so to contact the registration department to sign all necessary forms in order for [place] to release information regarding their care.   Consent: Patient/Guardian gives verbal consent for treatment and assignment of benefits for services provided during this visit. Patient/Guardian expressed understanding and agreed to proceed.   This note was generated in part or whole with voice recognition software. Voice recognition is usually quite accurate but there are transcription errors that can and very often do occur. I apologize for any typographical errors that were not detected and corrected.      Ursula Alert, MD 07/19/2021, 3:43 PM

## 2021-07-19 ENCOUNTER — Ambulatory Visit: Payer: 59

## 2021-07-22 ENCOUNTER — Encounter: Payer: Self-pay | Admitting: Family Medicine

## 2021-07-23 NOTE — Telephone Encounter (Signed)
Please advise 

## 2021-07-26 ENCOUNTER — Ambulatory Visit: Payer: Medicaid Other | Attending: Family Medicine | Admitting: Physical Therapy

## 2021-07-26 DIAGNOSIS — M542 Cervicalgia: Secondary | ICD-10-CM | POA: Diagnosis present

## 2021-07-26 DIAGNOSIS — G8929 Other chronic pain: Secondary | ICD-10-CM | POA: Diagnosis present

## 2021-07-26 DIAGNOSIS — M6281 Muscle weakness (generalized): Secondary | ICD-10-CM | POA: Insufficient documentation

## 2021-07-26 DIAGNOSIS — M25512 Pain in left shoulder: Secondary | ICD-10-CM | POA: Insufficient documentation

## 2021-07-26 NOTE — Therapy (Signed)
OUTPATIENT PHYSICAL THERAPY NECK TREATMENT   Patient Name: Denise Walker MRN: [id number] DOB:1990-09-22, 31 y.o., female Today's Date: 07/26/2021   PT End of Session - 07/26/21 1205     Visit Number 3    Number of Visits 17    Date for PT Re-Evaluation 08/16/21    Authorization Type eval: 06/21/21    PT Start Time 1102    PT Stop Time 1148    PT Time Calculation (min) 46 min    Activity Tolerance Patient tolerated treatment well    Behavior During Therapy [facility] for tasks assessed/performed              Past Medical History:  Diagnosis Date   Anxiety    Back pain    low back pain with lumbar radiculitis   Bilateral shoulder pain    BRCA negative 04/2018   MyRisk neg; IBIS=8.9%/riskscore=6.7%   Eclampsia    Family history of ovarian cancer    Gestational diabetes    Migraine headache    PCOS (polycystic ovarian syndrome)    Past Surgical History:  Procedure Laterality Date   CESAREAN SECTION N/A 07/15/2014   Procedure: CESAREAN SECTION;  Surgeon: Gae Dry, MD;  Location: [facility] OR[facility];  Service: Obstetrics;  Laterality: N/A;   CESAREAN SECTION N/A 02/10/2018   Procedure: CESAREAN SECTION;  Surgeon: Will Bonnet, MD;  Location: [facility];  Service: Obstetrics;  Laterality: N/A;   CESAREAN SECTION N/A 12/31/2018   Procedure: CESAREAN SECTION;  Surgeon: [name], MD;  Location: [facility];  Service: Obstetrics;  Laterality: N/A;   CESAREAN SECTION     HERNIA REPAIR  10/2013   WISDOM TOOTH EXTRACTION     Patient Active Problem List   Diagnosis Date Noted   Mixed hyperlipidemia 06/04/2021   Prediabetes 06/04/2021   Anxiety and depression 06/04/2021   PCOS (polycystic ovarian syndrome) 05/01/2021   Chronic pain of right knee 05/01/2021   Chronic foot pain, left 05/01/2021   Bacterial infection 01/21/2021   History of influenza 01/21/2021   Bronchitis 01/21/2021   ADHD (attention deficit hyperactivity disorder), inattentive type 12/21/2020   Chronic  post-traumatic stress disorder (PTSD) 12/21/2020   MDD (major depressive disorder), recurrent, in full remission ([place]) 11/15/2020   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   MDD (major depressive disorder), recurrent, in partial remission ([place]) 10/02/2020   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   Attention and concentration deficit 08/31/2020   Moderate episode of recurrent major depressive disorder ([place]) 06/29/2020   High risk medication use 06/29/2020   History of ADHD 06/29/2020   Uterine scar from previous cesarean delivery affecting pregnancy 12/31/2018   Anti-D antibodies present during pregnancy 07-14-2018   BRCA gene mutation negative 05/04/2018   History of cesarean delivery 02/10/2018   Rh negative state in antepartum period 08/23/2017   History of eclampsia 07/22/2017   Migraines 04/09/2017   GAD (generalized anxiety disorder) 12/01/2014    PCP: Montel Culver, MD  REFERRING PROVIDER: Montel Culver, MD  REFERRING DIAGNOSIS: Neck pain  THERAPY DIAG: Cervicalgia  Muscle weakness (generalized)  Chronic left shoulder pain  ONSET DATE: 06/22/2018 (approximate)  FOLLOW UP APPT WITH PROVIDER: Yes   FROM INITIAL EVALUATION  SUBJECTIVE:  Chief Complaint: Neck pain  Pertinent History Pt reports neck pain for 2-3 years. Insidious onset with no history of trauma to head/neck. Initially she thought the neck pain was related to her migraines but the migraines eventually were well managed and the neck pain continued. Pain radiates into the L upper arm however she experiences tingling in the entire L hand. She has chronic L shoulder pain and has difficulty lifting her kids, occasionally feeling a "catch" in her shoulder. Pt  has received steroid injections in her left shoulder with benefit but has never had any neck injections. She has chronic low back pain and has had lumbar injections. Pt reports intermittent migraines (6-7/month) and she is not currently taking any prophylactic migraine medications however does utilize rescue medications. Neurology wanted her to switch to [facility] but the cost was prohibitive. Per patient she is waiting to hear from neurology regarding insurance coverage/assistance. She gets a visual aura with her migraines and her triggers include noises, bright lights (TV and sunlight), and seasonal allergies. No known food triggers but pt has never kept a food diary or paid attention to any correlation with her diet. She had a CT scan of her head on 03/30/15 which was negative for any acute abnormalities per medical record. Recent cervical MRI showed mild cervical spondylosis. Mild multilevel disc degeneration, greatest at C4-C5 and C5-C6. No more than mild relative spinal canal narrowing. No significant foraminal stenosis. Nonspecific straightening of the expected cervical lordosis.  Pain:  Pain Intensity: Present: 2/10, Best: 1/10 Pain location: Bilateral suboccipital region radiating down sides of neck Pain Quality: aching Radiating: Yes  Numbness/Tingling: Yes Focal Weakness: Yes, L shoulder weakness but unclear if it is related to neck  Aggravating factors: End range cervical rotation, sitting in the recliner Relieving factors: Really hot bath, TENS unit, trigger point release from husband helps, neck support 24-hour pain behavior: Worsens as day progresses History of prior neck injury, pain, surgery, or therapy: None Follow-up appointment with MD: Yes, 07/12/19 with ortho for trigger point injections Dominant hand: left Imaging: Yes, see history Prior level of function: Independent 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  Recent memory is intact.  Remote memory is intact.  Attention span and concentration are intact.  Expressive speech is intact.  Patient's fund of knowledge is within normal limits for educational level.     Gross Musculoskeletal Assessment Tremor: None Bulk: Normal Tone: Normal  Gait Deferred full gait assessment at this time;  Posture Forward head and rounded shoulders in sitting with mild thoracic kyphosis;   AROM  AROM (Normal range in degrees) AROM 07/26/2021  Cervical  Flexion (50) 55*  Extension (80) 38*  Right lateral flexion (45) 40  Left lateral flexion (45) 40  Right rotation (85) 58*  Left rotation (85) 54*  (* = pain; Blank rows = not tested)  PROM: Deferred  MMT  MMT (out of 5) Right 07/26/2021 Left 07/26/2021  Cervical (isometric)  Flexion WNL  Extension WNL  Lateral Flexion WNL WNL  Rotation WNL WNL      Shoulder   Flexion 5 4+*  Extension    Abduction 5 5  Internal rotation (seated) 5 5  Horizontal abduction (seated) 5 5  Horizontal  adduction    Lower Trapezius    Rhomboids        Elbow  Flexion 5 5  Extension 5 5  Pronation    Supination        Wrist  Flexion 5 5  Extension 5 5  Radial deviation    Ulnar deviation        MCP  Flexion 5 5  Extension 5 5  Abduction    Adduction    (* = pain; Blank rows = not tested)  Sensation Deferred, to be performed at follow-up visit;  Reflexes Deferred   Palpation  Location LEFT  RIGHT           Suboccipitals 2 2  Cervical paraspinals 2 2  Upper Trapezius 2 2  Levator Scapulae 1 1  Rhomboid Major/Minor 1 1  (Blank rows = not tested) Graded on 0-4 scale (0 = no pain, 1 = pain, 2 = pain with wincing/grimacing/flinching, 3 = pain with withdrawal, 4 = unwilling to allow  palpation), (Blank rows = not tested)   Repeated Movements Deferred   Passive Accessory Intervertebral Motion Pt denies reproduction of neck pain with CPA C2-T1 however is tender to pressure on her spinous processes. She reports pain with pressure along cervical paraspinals so unable to assess unilateral passive accessory mobility.     SPECIAL TESTS Spurlings A (ipsilateral lateral flexion/axial compression): R: Negative L: Negative Spurlings B (ipsilateral lateral flexion/contralateral rotation/axial compression): R: Negative L: Negative Distraction Test: Positive for discomfort  Hoffman Sign (cervical cord compression): R: Negative L: Negative ULTT Median: R: Not examined L: Not examined ULTT Ulnar: R: Not examined L: Not examined ULTT Radial: R: Not examined L: Not examined    SUBJECTIVE: Pt reports a headache that started yesterday; rates HA a 5/10 upon arrival. Describes it as throbbing; states she can feel her pulse in her head. She does state that external pressure (husband pushing on the top of her head and temples) helps reduce pain momentarily.    TODAY'S TREATMENT    Manual Therapy  Prone grade I-II CPA mobilizations C2-C4, 30s/bout x two bouts/level; Prone grade I-II UPA mobilizations C2-C4, 30s/bout x two bouts/level bilaterally; Prone C1 lateral mass moblizations, grade I, 20s/bout x 2 bouts bilaterally; Supine bilateral upper trap and lateral flexion x 45s each bilaterally; Supine suboccipital release 2 x 2 minutes;   Therapeutic Exercise Performed in supine:  Cervical protraction w/ 5 sec isometric, x10 bilateral  Cervical retraction w/ 5 second isometric, x10 bilateral  Cervical rotation into light resistance w/ 5 second isometric, x10 bilateral   Seated EOM: Cervical lateral flexion into light resistance w/ 5 second isometric, x10 bilateral  Upper trap and levator scap stretch, 2x30 seconds bilateral  Created and proved HEP printout of above exercises  and stretches.     Not performed: Electrical Stimulation  Custom symmetric biphasic estim applied to cervical paraspinals/occipital nerves at 100Hz  and 400 microseconds using one channel at pt tolerated intensity of 4.0 x 15 minutes with heat pack applied to posterior cervical spine during therapy session. Mechanical Traction After, and separate to, manual therapy performed mechanical cervical traction in supine at 30 degree pull angle, 20# x 9 minutes.  Patient denies any pain during or after traction in her neck;     PATIENT EDUCATION:  Education details: Plan of care Person educated: Patient Education method: Explanation and handout Education comprehension: verbalized understanding   HOME EXERCISE PROGRAM: Access Code: [email or web address]   Exercises - Supine Cervical Retraction with  Towel  - 1 x daily - 7 x weekly - 10 reps - 5 seconds  hold - Supine Deep Neck Flexor Training - Repetitions  - 1 x daily - 7 x weekly - 1 sets - 10 reps - 5 seconds hold - Supine Isometric Neck Rotation  - 1 x daily - 7 x weekly - 1 sets - 10 reps - 5 seconds hold - Seated Isometric Cervical Sidebending  - 1 x daily - 7 x weekly - 1 sets - 10 reps - 5 seconds  hold - Seated Cervical Sidebending Stretch  - 1 x daily - 7 x weekly - 2 reps - 30 seconds hold - Seated Levator Scapulae Stretch  - 1 x daily - 7 x weekly - 2 reps - 30 seconds hold   ASSESSMENT:  CLINICAL IMPRESSION: Continued manual therapy POC; good muscle relaxation and tension relief achieved with suboccipital release. Manual was followed by gentle strengthening utilizing isometrics and stretching. Pt performed all pain-free. HEP was created and provided to pt including these pain-free interventions. NDI indicates 28% disability related to neck pain. Primary limitation occurring due to severe headaches that occur frequently. Pt will benefit from PT services to address deficits in pain in order to return to full function at home.     REHAB  POTENTIAL: Good  CLINICAL DECISION MAKING: Evolving/moderate complexity  EVALUATION COMPLEXITY: Moderate   GOALS: Goals reviewed with patient? Yes  SHORT TERM GOALS: Target date: 08/23/2021  Pt will be independent with HEP to improve strength and range of motion as well as decrease neck pain to improve pain-free function at home and work. Baseline:  Goal status: INITIAL   LONG TERM GOALS: Target date: 09/20/2021  Pt will increase FOTO to at least 63 to demonstrate significant improvement in function at home and work related to neck pain  Baseline: 06/21/21: 56 Goal status: INITIAL  2.  Pt will decrease worst neck pain by at least 2 points on the NPRS in order to demonstrate clinically significant reduction in neck pain. Baseline:  Goal status: INITIAL  3.  Pt will decrease NDI score by at least 19% in order demonstrate clinically significant reduction in neck pain/disability.     Baseline: 06/21/21: To be completed at next session; Goal status: INITIAL   PLAN: PT FREQUENCY: 2x/week  PT DURATION: 8 weeks  PLANNED INTERVENTIONS: Therapeutic exercises, Therapeutic activity, Neuromuscular re-education, Balance training, Gait training, Patient/Family education, Joint manipulation, Joint mobilization, Vestibular training, Canalith repositioning, Dry Needling, Electrical stimulation, Spinal manipulation, Spinal mobilization, Cryotherapy, Moist heat, Taping, Traction, Ultrasound, Ionotophoresis 73m/ml Dexamethasone, and Manual therapy  PLAN FOR NEXT SESSION: Ask about worst pain, upper cervical mobilizations, STM, cervical and upper quarter strengthening;   KPatrina LeveringPT, DPT

## 2021-08-01 ENCOUNTER — Other Ambulatory Visit: Payer: Self-pay | Admitting: Psychiatry

## 2021-08-01 DIAGNOSIS — F331 Major depressive disorder, recurrent, moderate: Secondary | ICD-10-CM

## 2021-08-02 DIAGNOSIS — M542 Cervicalgia: Secondary | ICD-10-CM

## 2021-08-07 ENCOUNTER — Ambulatory Visit: Payer: Medicaid Other

## 2021-08-07 DIAGNOSIS — M542 Cervicalgia: Secondary | ICD-10-CM

## 2021-08-09 ENCOUNTER — Telehealth: Payer: 59 | Admitting: Psychiatry

## 2021-08-16 ENCOUNTER — Ambulatory Visit: Payer: Medicaid Other | Attending: Orthopedic

## 2021-08-16 DIAGNOSIS — M542 Cervicalgia: Secondary | ICD-10-CM | POA: Insufficient documentation

## 2021-08-16 DIAGNOSIS — M6281 Muscle weakness (generalized): Secondary | ICD-10-CM | POA: Insufficient documentation

## 2021-08-21 ENCOUNTER — Encounter: Payer: Self-pay | Admitting: Psychiatry

## 2021-08-21 ENCOUNTER — Telehealth (INDEPENDENT_AMBULATORY_CARE_PROVIDER_SITE_OTHER): Payer: Medicaid Other | Admitting: Psychiatry

## 2021-08-21 DIAGNOSIS — F4312 Post-traumatic stress disorder, chronic: Secondary | ICD-10-CM

## 2021-08-21 DIAGNOSIS — F5105 Insomnia due to other mental disorder: Secondary | ICD-10-CM

## 2021-08-21 DIAGNOSIS — F3342 Major depressive disorder, recurrent, in full remission: Secondary | ICD-10-CM

## 2021-08-21 DIAGNOSIS — F900 Attention-deficit hyperactivity disorder, predominantly inattentive type: Secondary | ICD-10-CM | POA: Diagnosis not present

## 2021-08-21 DIAGNOSIS — F411 Generalized anxiety disorder: Secondary | ICD-10-CM | POA: Diagnosis not present

## 2021-08-21 MED ORDER — LAMOTRIGINE 25 MG PO TABS: 50.0000 mg | ORAL_TABLET | Freq: Every day | ORAL | 0 refills | Status: DC

## 2021-08-21 MED ORDER — LAMOTRIGINE 200 MG PO TABS: 200.0000 mg | ORAL_TABLET | Freq: Every day | ORAL | 0 refills | Status: DC

## 2021-08-21 NOTE — Progress Notes (Unsigned)
Virtual Visit via Video Note  I connected with Rosaland Lao on 08/21/21 at  1:30 PM EDT by a video enabled telemedicine application and verified that I am speaking with the correct person using two identifiers.  Location Provider Location : [facility] Patient Location : Home  Participants: Patient , Provider    I discussed the limitations of evaluation and management by telemedicine and the availability of in person appointments. The patient expressed understanding and agreed to proceed.    I discussed the assessment and treatment plan with the patient. The patient was provided an opportunity to ask questions and all were answered. The patient agreed with the plan and demonstrated an understanding of the instructions.   The patient was advised to call back or seek an in-person evaluation if the symptoms worsen or if the condition fails to improve as anticipated.   [place] MD OP Progress Note  08/21/2021 2:06 PM Jocabed Cheese  MRN:  [id number]  Chief Complaint:  Chief Complaint  Patient presents with   Follow-up: 31 year old Caucasian female with history of depression, anxiety presents with continued anxiety symptoms for medication management.   HPI: Parisa Pinela is a 31 year old Caucasian female, stay-at-home mom, lives in [facility], has a history of MDD, GAD, history of ADHD, insomnia, PTSD was evaluated for medication management.  Patient today reports she has noticed some improvement with her anxiety on the current dosage of Cymbalta.  Cymbalta was increased to 90 mg beginning of June.  She however continues to struggle with nervousness, feeling on edge, worrying about things in general and trouble relaxing.  Patient reports her kids continues to be her trigger.  Patient reports her daughter continues to have medical problems which is a constant trigger.  She has been in psychotherapy, agreeable to having these therapy sessions more frequently.  Patient reports she also struggles with  headaches.  She is currently with a new neurologist and has had medication changes which are not effective.  She reports the headaches does have an impact on her anxiety.  She does have upcoming appointment and plans to discuss.  Currently in physical therapy and reports therapy sessions are beneficial.  Patient reports her physical therapist is also working with her on her headaches and that has been helpful as well.  Patient denies any suicidality, homicidality or perceptual disturbances.  Denies any significant depressive symptoms.  Currently compliant on medications.  Denies side effects.  Limiting the use of clonazepam.  Patient also on Adderall however has been using it only as needed.  Patient denies any other concerns today.  Visit Diagnosis:    ICD-10-CM   1. MDD (major depressive disorder), recurrent, in full remission ([place])  F33.42 lamoTRIgine (LAMICTAL) 25 MG tablet    2. GAD (generalized anxiety disorder)  F41.1 lamoTRIgine (LAMICTAL) 200 MG tablet    3. Insomnia due to mental disorder  F51.05    Anxiety, pain    4. ADHD (attention deficit hyperactivity disorder), inattentive type  F90.0     5. Chronic post-traumatic stress disorder (PTSD)  F43.12       Past Psychiatric History: Reviewed past psychiatric history from progress note on 06/29/2020.  Past trials of medications like Lexapro, Zoloft, Celexa, Trintellix, Concerta, Ritalin, Wellbutrin.  Neuropsychological testing completed-11/08/2020-diagnosed with ADHD.  Past Medical History:  Past Medical History:  Diagnosis Date   Anxiety    Back pain    low back pain with lumbar radiculitis   Bilateral shoulder pain    BRCA negative 04/2018  MyRisk neg; IBIS=8.9%/riskscore=6.7%   Eclampsia    Family history of ovarian cancer    Gestational diabetes    Migraine headache    PCOS (polycystic ovarian syndrome)     Past Surgical History:  Procedure Laterality Date   CESAREAN SECTION N/A 07/15/2014   Procedure:  CESAREAN SECTION;  Surgeon: Gae Dry, MD;  Location: [facility] ORS;  Service: Obstetrics;  Laterality: N/A;   CESAREAN SECTION N/A 02/10/2018   Procedure: CESAREAN SECTION;  Surgeon: Will Bonnet, MD;  Location: [facility] ORS;  Service: Obstetrics;  Laterality: N/A;   CESAREAN SECTION N/A 12/31/2018   Procedure: CESAREAN SECTION;  Surgeon: [name], MD;  Location: [facility] ORS;  Service: Obstetrics;  Laterality: N/A;   CESAREAN SECTION     HERNIA REPAIR  10/2013   WISDOM TOOTH EXTRACTION      Family Psychiatric History: Reviewed family psychiatric history from progress note on 06/29/2020.  Family History:  Family History  Problem Relation Age of Onset   Bipolar disorder Mother    Ovarian cancer Mother    Arthritis Mother    Ovarian cancer Maternal Aunt    Diabetes Paternal Aunt    Breast cancer Maternal Grandmother    Diabetes Paternal Grandfather    Uterine cancer Cousin    ADD / ADHD Daughter    ODD Daughter     Social History: Reviewed social history from progress note on 06/29/2020. Social History   Socioeconomic History   Marital status: Married    Spouse name: Einar Pheasant   Number of children: 3   Years of education: high school   Highest education level: Not on file  Occupational History   Not on file  Tobacco Use   Smoking status: Never   Smokeless tobacco: Never  Vaping Use   Vaping Use: Never used  Substance and Sexual Activity   Alcohol use: Yes    Comment: occasional wine cooler   Drug use: Never   Sexual activity: Yes    Birth control/protection: I.U.D.    Comment: will discuss with MD  Other Topics Concern   Not on file  Social History Narrative   Not on file   Social Determinants of Health   Financial Resource Strain: Low Risk  (01/12/2018)   Overall Financial Resource Strain (CARDIA)    Difficulty of Paying Living Expenses: Not hard at all  Food Insecurity: No Food Insecurity (01/12/2018)   Hunger Vital Sign    Worried About Running Out of  Food in the Last Year: Never true    Ran Out of Food in the Last Year: Never true  Transportation Needs: No Transportation Needs (01/12/2018)   PRAPARE - Hydrologist (Medical): No    Lack of Transportation (Non-Medical): No  Physical Activity: Inactive (12/27/2016)   Exercise Vital Sign    Days of Exercise per Week: 0 days    Minutes of Exercise per Session: 0 min  Stress: Stress Concern Present (12/27/2016)   [place]    Feeling of Stress : To some extent  Social Connections: Somewhat Isolated (12/27/2016)   Social Connection and Isolation Panel [NHANES]    Frequency of Communication with Friends and Family: More than three times a week    Frequency of Social Gatherings with Friends and Family: Three times a week    Attends Religious Services: Never    Active Member of Clubs or Organizations: No    Attends Archivist Meetings:  Never    Marital Status: Married    Allergies:  Allergies  Allergen Reactions   Fish Allergy Shortness Of Breath   Shellfish Allergy Shortness Of Breath   Rizatriptan Other (See Comments)    "Head pressure, neck locking up" "Head pressure, neck locking up"    Metabolic Disorder Labs: Lab Results  Component Value Date   HGBA1C 5.5 06/08/2020   Lab Results  Component Value Date   PROLACTIN 8.8 03/27/2017   Lab Results  Component Value Date   CHOL 183 05/01/2021   TRIG 155 (H) 05/01/2021   HDL 46 05/01/2021   CHOLHDL 4.0 05/01/2021   LDLCALC 110 (H) 05/01/2021   LDLCALC 96 06/08/2020   Lab Results  Component Value Date   TSH 4.350 09/22/2019   TSH 2.170 03/27/2017    Therapeutic Level Labs: No results found for: "LITHIUM" No results found for: "VALPROATE" No results found for: "CBMZ"  Current Medications: Current Outpatient Medications  Medication Sig Dispense Refill   amphetamine-dextroamphetamine (ADDERALL) 30 MG tablet Take  0.5 tablets by mouth 2 (two) times daily. Take 0.5 tablets by mouth at 7 AM and 1 PM 30 tablet 0   Butalbital-APAP-Caffeine 50-325-40 MG capsule Take by mouth.     clonazePAM (KLONOPIN) 0.5 MG tablet Take 1 tablet (0.5 mg total) by mouth daily as needed for anxiety. 10 tablet 0   clotrimazole-betamethasone (LOTRISONE) cream Apply topically 2 (two) times daily.     DULoxetine (CYMBALTA) 30 MG capsule TAKE 3 CAPSULES (90 MG TOTAL) BY MOUTH EVERY EVENING. 270 capsule 0   hydrOXYzine (ATARAX) 25 MG tablet Take 1-2 tablets (25-50 mg total) by mouth daily as needed for anxiety. 180 tablet 0   levocetirizine (XYZAL) 5 MG tablet Take 1 tablet (5 mg total) by mouth every evening. 30 tablet 0   levonorgestrel (MIRENA) 20 MCG/24HR IUD by Intrauterine route.     methocarbamol (ROBAXIN) 500 MG tablet Take 500 mg by mouth 3 (three) times daily.     omeprazole (PRILOSEC) 40 MG capsule Take 1 capsule (40 mg total) by mouth daily. 90 capsule 0   Vitamin D, Ergocalciferol, (DRISDOL) 1.25 MG (50000 UNIT) CAPS capsule Take 1 capsule (50,000 Units total) by mouth every 7 (seven) days. Take for 8 total doses(weeks) 8 capsule 0   lamoTRIgine (LAMICTAL) 200 MG tablet Take 1 tablet (200 mg total) by mouth daily. 90 tablet 0   lamoTRIgine (LAMICTAL) 25 MG tablet Take 2 tablets (50 mg total) by mouth daily. Take along with 200 mg daily. 180 tablet 0   naproxen (NAPROSYN) 500 MG tablet 1 po bid prn (Patient not taking: Reported on 08/21/2021)     No current facility-administered medications for this visit.     Musculoskeletal: Strength & Muscle Tone:  UTA Gait & Station:  Seated Patient leans: N/A  Psychiatric Specialty Exam: Review of Systems  Musculoskeletal:  Positive for neck pain.  Neurological:  Positive for headaches.  Psychiatric/Behavioral:  The patient is nervous/anxious.   All other systems reviewed and are negative.   There were no vitals taken for this visit.There is no height or weight on file to  calculate BMI.  General Appearance: Casual  Eye Contact:  Fair  Speech:  Clear and Coherent  Volume:  Normal  Mood:  Anxious  Affect:  Congruent  Thought Process:  Goal Directed and Descriptions of Associations: Intact  Orientation:  Full (Time, Place, and Person)  Thought Content: Logical   Suicidal Thoughts:  No  Homicidal Thoughts:  No  Memory:  Immediate;   Fair Recent;   Fair Remote;   Fair  Judgement:  Fair  Insight:  Fair  Psychomotor Activity:  Normal  Concentration:  Concentration: Fair and Attention Span: Fair  Recall:  [employer] of Knowledge: Fair  Language: Fair  Akathisia:  No  Handed:  Left  AIMS (if indicated): not done  Assets:  Communication Skills Desire for Improvement Housing Social Support  ADL's:  Intact  Cognition: WNL  Sleep:  Fair   Screenings: [place] Office Visit from 06/06/2021 in [place] Video Visit from 10/02/2020 in [place] Total Score 0 2      GAD-7    Flowsheet Row Video Visit from 08/21/2021 in [place] Video Visit from 07/18/2021 in [place] Visit from 06/04/2021 in [facility] Office Visit from 05/01/2021 in [facility] Video Visit from 04/17/2021 in [place]  Total GAD-7 Score 16 20 13 13 7       PHQ2-9    Flowsheet Row Video Visit from 07/18/2021 in [place] Office Visit from 06/06/2021 in [place] Office Visit from 06/04/2021 in [facility] Office Visit from 05/01/2021 in [facility] Video Visit from 04/17/2021 in [place]  PHQ-2 Total Score 0 4 0 2 0  PHQ-9 Total Score 5 11 7 11  --      [place] Visit from 06/06/2021 in [place] Video Visit from 04/17/2021 in [place] Counselor from 03/23/2021 in [place] No Risk No Risk No Risk        Assessment and Plan: Jennilyn Esteve is a 31 year old Caucasian female, lives in [place], married, has a history of depression, anxiety, PCOS, chronic pain was evaluated by telemedicine today.  Patient is currently struggling with anxiety although she has noticed some improvement, patient with headaches which does have an impact on her mood, will benefit from the following plan.  Plan MDD in remission Cymbalta 90 mg p.o. daily Lamotrigine 250 mg p.o. daily  GAD-improving Cymbalta 90 mg p.o. daily We will consider increasing the dosage in the future, patient declines today. Lamotrigine 250 mg p.o. daily Clonazepam 0.5 mg as needed for severe anxiety attacks.  Patient has been limiting use.  Reviewed  PMP aware Hydroxyzine 25-50 mg p.o. daily as needed for severe anxiety Continue CBT with Ms. Christina Hussami -patient to have more frequent sessions.  We will coordinate care.  ADHD-stable Adderall 30 mg p.o. daily in divided dosage.   Chronic PTSD-stable Continue CBT  Insomnia-improving Continue sleep hygiene techniques  Patient to contact neurology for headaches.  Follow-up in clinic in 4 to 6 weeks or sooner if needed. Collaboration of Care: Collaboration of Care: Referral or follow-up with counselor/therapist AEB encouraged to follow up with therapist and Other encouraged to follow up with neurology for headaches.  Patient/Guardian was advised Release of Information must be obtained prior to any record release in order to collaborate their care with an outside provider. Patient/Guardian was advised if they have not already done so to contact the registration department to sign all necessary forms in order for [place] to release information regarding their care.   Consent: Patient/Guardian gives verbal consent for treatment and assignment  of benefits for services provided during this visit. Patient/Guardian expressed understanding and agreed to proceed.  This note was generated in part or whole with voice recognition software. Voice recognition is usually quite accurate but there are transcription errors that can and very often do occur. I apologize for any typographical errors that were not detected and corrected.      Ursula Alert, MD 08/22/2021, 12:49 PM

## 2021-08-22 NOTE — Therapy (Signed)
OUTPATIENT PHYSICAL THERAPY NECK TREATMENT/RECERTIFICATION   Patient Name: Denise Walker MRN: [id number] DOB:04-Apr-1990, 31 y.o., female Today's Date: 08/23/2021   PT End of Session - 08/23/21 1021     Visit Number 5    Number of Visits 33    Date for PT Re-Evaluation 10/18/21    Authorization Type eval: 06/21/21    PT Start Time 1022    PT Stop Time 1100    PT Time Calculation (min) 38 min    Activity Tolerance Patient tolerated treatment well    Behavior During Therapy [facility] for tasks assessed/performed                Past Medical History:  Diagnosis Date   Anxiety    Back pain    low back pain with lumbar radiculitis   Bilateral shoulder pain    BRCA negative 04/2018   MyRisk neg; IBIS=8.9%/riskscore=6.7%   Eclampsia    Family history of ovarian cancer    Gestational diabetes    Migraine headache    PCOS (polycystic ovarian syndrome)    Past Surgical History:  Procedure Laterality Date   CESAREAN SECTION N/A 07/15/2014   Procedure: CESAREAN SECTION;  Surgeon: Gae Dry, MD;  Location: [facility] OR[facility];  Service: Obstetrics;  Laterality: N/A;   CESAREAN SECTION N/A 02/10/2018   Procedure: CESAREAN SECTION;  Surgeon: Will Bonnet, MD;  Location: [facility];  Service: Obstetrics;  Laterality: N/A;   CESAREAN SECTION N/A 12/31/2018   Procedure: CESAREAN SECTION;  Surgeon: [name], MD;  Location: [facility] OR[facility];  Service: Obstetrics;  Laterality: N/A;   CESAREAN SECTION     HERNIA REPAIR  10/2013   WISDOM TOOTH EXTRACTION     Patient Active Problem List   Diagnosis Date Noted   Mixed hyperlipidemia 06/04/2021   Prediabetes 06/04/2021   Anxiety and depression 06/04/2021   PCOS (polycystic ovarian syndrome) 05/01/2021   Chronic pain of right knee 05/01/2021   Chronic foot pain, left 05/01/2021   Bacterial infection 01/21/2021   History of influenza 01/21/2021   Bronchitis 01/21/2021   ADHD (attention deficit hyperactivity disorder), inattentive type  12/21/2020   Chronic post-traumatic stress disorder (PTSD) 12/21/2020   MDD (major depressive disorder), recurrent, in full remission ([place]) 11/15/2020   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   MDD (major depressive disorder), recurrent, in partial remission ([place]) 10/02/2020   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   Attention and concentration deficit 08/31/2020   Moderate episode of recurrent major depressive disorder ([place]) 06/29/2020   High risk medication use 06/29/2020   History of ADHD 06/29/2020   Uterine scar from previous cesarean delivery affecting pregnancy 12/31/2018   Anti-D antibodies present during pregnancy 2018/07/16   BRCA gene mutation negative 05/04/2018   History of cesarean delivery 02/10/2018   Rh negative state in antepartum period 08/23/2017   History of eclampsia 07/22/2017   Migraines 04/09/2017   GAD (generalized anxiety disorder) 12/01/2014    PCP: Montel Culver, MD  REFERRING PROVIDER: Charlynne Cousins, NP  REFERRING DIAGNOSIS: Neck pain  THERAPY DIAG: Cervicalgia  Muscle weakness (generalized)  ONSET DATE: 06/22/2018 (approximate)  FOLLOW UP APPT WITH PROVIDER: Yes   FROM INITIAL EVALUATION  SUBJECTIVE:  Chief Complaint: Neck pain  Pertinent History Pt reports neck pain for 2-3 years. Insidious onset with no history of trauma to head/neck. Initially she thought the neck pain was related to her migraines but the migraines eventually were well managed and the neck pain continued. Pain radiates into the L upper arm however she experiences tingling in the entire L hand. She has chronic L shoulder pain and has difficulty lifting her kids, occasionally feeling a "catch" in her shoulder. Pt has  received steroid injections in her left shoulder with benefit but has never had any neck injections. She has chronic low back pain and has had lumbar injections. Pt reports intermittent migraines (6-7/month) and she is not currently taking any prophylactic migraine medications however does utilize rescue medications. Neurology wanted her to switch to [facility] but the cost was prohibitive. Per patient she is waiting to hear from neurology regarding insurance coverage/assistance. She gets a visual aura with her migraines and her triggers include noises, bright lights (TV and sunlight), and seasonal allergies. No known food triggers but pt has never kept a food diary or paid attention to any correlation with her diet. She had a CT scan of her head on 03/30/15 which was negative for any acute abnormalities per medical record. Recent cervical MRI showed mild cervical spondylosis. Mild multilevel disc degeneration, greatest at C4-C5 and C5-C6. No more than mild relative spinal canal narrowing. No significant foraminal stenosis. Nonspecific straightening of the expected cervical lordosis.  Pain:  Pain Intensity: Present: 2/10, Best: 1/10 Pain location: Bilateral suboccipital region radiating down sides of neck Pain Quality: aching Radiating: Yes  Numbness/Tingling: Yes Focal Weakness: Yes, L shoulder weakness but unclear if it is related to neck  Aggravating factors: End range cervical rotation, sitting in the recliner Relieving factors: Really hot bath, TENS unit, trigger point release from husband helps, neck support 24-hour pain behavior: Worsens as day progresses History of prior neck injury, pain, surgery, or therapy: None Follow-up appointment with MD: Yes, 07/12/19 with ortho for trigger point injections Dominant hand: left Imaging: Yes, see history Prior level of function: Independent Occupational 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  Recent memory is intact.  Remote memory is intact.  Attention span and concentration are intact.  Expressive speech is intact.  Patient's fund of knowledge is within normal limits for educational level.     Gross Musculoskeletal Assessment Tremor: None Bulk: Normal Tone: Normal  Gait Deferred full gait assessment at this time;  Posture Forward head and rounded shoulders in sitting with mild thoracic kyphosis;   AROM  AROM (Normal range in degrees) AROM 08/23/2021  Cervical  Flexion (50) 55*  Extension (80) 38*  Right lateral flexion (45) 40  Left lateral flexion (45) 40  Right rotation (85) 58*  Left rotation (85) 54*  (* = pain; Blank rows = not tested)  PROM: Deferred  MMT  MMT (out of 5) Right 08/23/2021 Left 08/23/2021  Cervical (isometric)  Flexion WNL  Extension WNL  Lateral Flexion WNL WNL  Rotation WNL WNL      Shoulder   Flexion 5 4+*  Extension    Abduction 5 5  Internal rotation (seated) 5 5  Horizontal abduction (seated) 5  5  Horizontal adduction    Lower Trapezius    Rhomboids        Elbow  Flexion 5 5  Extension 5 5  Pronation    Supination        Wrist  Flexion 5 5  Extension 5 5  Radial deviation    Ulnar deviation        MCP  Flexion 5 5  Extension 5 5  Abduction    Adduction    (* = pain; Blank rows = not tested)  Sensation Deferred, to be performed at follow-up visit;  Reflexes Deferred   Palpation  Location LEFT  RIGHT           Suboccipitals 2 2  Cervical paraspinals 2 2  Upper Trapezius 2 2  Levator Scapulae 1 1  Rhomboid Major/Minor 1 1  (Blank rows = not tested) Graded on 0-4 scale (0 = no pain, 1 = pain, 2 = pain with wincing/grimacing/flinching, 3 = pain with withdrawal, 4 =  unwilling to allow palpation), (Blank rows = not tested)   Repeated Movements Deferred   Passive Accessory Intervertebral Motion Pt denies reproduction of neck pain with CPA C2-T1 however is tender to pressure on her spinous processes. She reports pain with pressure along cervical paraspinals so unable to assess unilateral passive accessory mobility.     SPECIAL TESTS Spurlings A (ipsilateral lateral flexion/axial compression): R: Negative L: Negative Spurlings B (ipsilateral lateral flexion/contralateral rotation/axial compression): R: Negative L: Negative Distraction Test: Positive for discomfort  Hoffman Sign (cervical cord compression): R: Negative L: Negative ULTT Median: R: Not examined L: Not examined ULTT Ulnar: R: Not examined L: Not examined ULTT Radial: R: Not examined L: Not examined    TODAY'S TREATMENT   SUBJECTIVE: Pt reports that she is doing well today. She denies any headache or neck pain upon arrival at rest but she does have some pain with touch. She has noticed some improvement in her neck since starting with therapy. No specific questions or concerns currently.   Pain: Denies   Printmaker  Custom symmetric biphasic estim applied to cervical paraspinals/occipital nerves at 100Hz  and 400 microseconds using one channel at pt tolerated intensity of 6.0 x 10 minutes with heat pack applied to posterior cervical spine during therapy session.     Manual Therapy  Updated outcome measures with patient: FOTO: 54 Worst Neck Pain: 5/10; NDI: 26%  Prone grade I-II CPA mobilizations C2-C3, 30s/bout x 3 bouts/level; Prone grade I-II UPA mobilizations C2-C3, 30s/bout x 3 bouts/level bilaterally; Prone C1 lateral mass moblizations, grade I, 20s/bout x 1 bout bilaterally; Prone STM to bilateral upper traps and levator scapulae, pt is very sensitive to touch; Supine bilateral upper trap and lateral flexion x 45s each bilaterally; During manual therapy applied  custom symmetric biphasic estim applied on forehead over eyes for supraorbital branch of trigeminal nerve stimulation at 100Hz  and 400 microseconds using one channel at pt tolerated intensity of 5.5 x 20 minutes.   Mechanical Traction After, and separate to, manual therapy performed mechanical cervical traction in supine at 30 degree pull angle, 25# x 8 minutes.  Patient denies any pain during or after traction in her neck;     PATIENT EDUCATION:  Education details: Plan of care Person educated: Patient Education method: Explanation and handout Education comprehension: verbalized understanding   HOME EXERCISE PROGRAM: Continue neck stretches   ASSESSMENT:  CLINICAL IMPRESSION: Updated outcome measures with patient during session today. Overall she reports improvement in  her neck pain since starting with therapy. However, her FOTO score does not show any improvement yet. Her NDI was 26% today and her worse neck pain increases to around 5/10 currently. Unfortunately she has only been able to participate in 4 treatment sessions so progress would be expected to be limited. Continued manual techniques including upper cervical mobilizations and stretches. Utilized estim over upper cervical spine to target occipital nerve and on forehead to target supraorbital branch of trigeminal nerve during session. Utilized mechanical traction due to radicular UE symptoms and pt reporting benefit during prior sessions. Pt encouraged to follow-up as scheduled. Pt will benefit from PT services to address deficits in pain in order to return to full function at home.   REHAB POTENTIAL: Good  CLINICAL DECISION MAKING: Evolving/moderate complexity  EVALUATION COMPLEXITY: Moderate   GOALS: Goals reviewed with patient? Yes  SHORT TERM GOALS: Target date: 09/20/2021  Pt will be independent with HEP to improve strength and range of motion as well as decrease neck pain to improve pain-free function at home and  work. Baseline:  Goal status: INITIAL   LONG TERM GOALS: Target date: 10/18/2021  Pt will increase FOTO to at least 63 to demonstrate significant improvement in function at home and work related to neck pain  Baseline: 06/21/21: 56; 08/23/21: 54 Goal status: INITIAL  2.  Pt will decrease worst neck pain by at least 2 points on the NPRS in order to demonstrate clinically significant reduction in neck pain. Baseline: 08/23/21: 5/10 Goal status: INITIAL  3.  Pt will decrease NDI score by at least 19% in order demonstrate clinically significant reduction in neck pain/disability.     Baseline: 06/21/21: To be completed at next session; 08/23/21: 26% Goal status: INITIAL   PLAN: PT FREQUENCY: 2x/week  PT DURATION: 8 weeks  PLANNED INTERVENTIONS: Therapeutic exercises, Therapeutic activity, Neuromuscular re-education, Balance training, Gait training, Patient/Family education, Joint manipulation, Joint mobilization, Vestibular training, Canalith repositioning, Dry Needling, Electrical stimulation, Spinal manipulation, Spinal mobilization, Cryotherapy, Moist heat, Taping, Traction, Ultrasound, Ionotophoresis 65m/ml Dexamethasone, and Manual therapy  PLAN FOR NEXT SESSION: upper cervical mobilizations, STM, cervical and upper quarter strengthening;  Denise Walker D Denise Walker PT, DPT, GCS  [place] 08/23/2021, 1:20 PM

## 2021-08-23 ENCOUNTER — Ambulatory Visit: Payer: Medicaid Other | Attending: Orthopedic

## 2021-08-23 DIAGNOSIS — M542 Cervicalgia: Secondary | ICD-10-CM | POA: Insufficient documentation

## 2021-08-23 DIAGNOSIS — M6281 Muscle weakness (generalized): Secondary | ICD-10-CM | POA: Insufficient documentation

## 2021-08-30 ENCOUNTER — Ambulatory Visit: Payer: Medicaid Other

## 2021-08-30 DIAGNOSIS — M542 Cervicalgia: Secondary | ICD-10-CM

## 2021-08-30 DIAGNOSIS — M6281 Muscle weakness (generalized): Secondary | ICD-10-CM | POA: Diagnosis present

## 2021-08-30 NOTE — Therapy (Signed)
OUTPATIENT PHYSICAL THERAPY NECK TREATMENT   Patient Name: Denise Walker MRN: [id number] DOB:20-Jul-1990, 31 y.o., female Today's Date: 08/30/2021   PT End of Session - 08/30/21 1109     Visit Number 6    Number of Visits 33    Date for PT Re-Evaluation 10/18/21    Authorization Type eval: 06/21/21    PT Start Time 1111    PT Stop Time 1145    PT Time Calculation (min) 34 min    Activity Tolerance Patient tolerated treatment well    Behavior During Therapy [facility] for tasks assessed/performed                 Past Medical History:  Diagnosis Date   Anxiety    Back pain    low back pain with lumbar radiculitis   Bilateral shoulder pain    BRCA negative 04/2018   MyRisk neg; IBIS=8.9%/riskscore=6.7%   Eclampsia    Family history of ovarian cancer    Gestational diabetes    Migraine headache    PCOS (polycystic ovarian syndrome)    Past Surgical History:  Procedure Laterality Date   CESAREAN SECTION N/A 07/15/2014   Procedure: CESAREAN SECTION;  Surgeon: Gae Dry, MD;  Location: [facility] ORS;  Service: Obstetrics;  Laterality: N/A;   CESAREAN SECTION N/A 02/10/2018   Procedure: CESAREAN SECTION;  Surgeon: Will Bonnet, MD;  Location: [facility] ORS;  Service: Obstetrics;  Laterality: N/A;   CESAREAN SECTION N/A 12/31/2018   Procedure: CESAREAN SECTION;  Surgeon: [name], MD;  Location: [facility] ORS;  Service: Obstetrics;  Laterality: N/A;   CESAREAN SECTION     HERNIA REPAIR  10/2013   WISDOM TOOTH EXTRACTION     Patient Active Problem List   Diagnosis Date Noted   Mixed hyperlipidemia 06/04/2021   Prediabetes 06/04/2021   Anxiety and depression 06/04/2021   PCOS (polycystic ovarian syndrome) 05/01/2021   Chronic pain of right knee 05/01/2021   Chronic foot pain, left 05/01/2021   Bacterial infection 01/21/2021   History of influenza 01/21/2021   Bronchitis 01/21/2021   ADHD (attention deficit hyperactivity disorder), inattentive type 12/21/2020    Chronic post-traumatic stress disorder (PTSD) 12/21/2020   MDD (major depressive disorder), recurrent, in full remission ([place]) 11/15/2020   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   MDD (major depressive disorder), recurrent, in partial remission ([place]) 10/02/2020   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   Attention and concentration deficit 08/31/2020   Moderate episode of recurrent major depressive disorder ([place]) 06/29/2020   High risk medication use 06/29/2020   History of ADHD 06/29/2020   Uterine scar from previous cesarean delivery affecting pregnancy 12/31/2018   Anti-D antibodies present during pregnancy 2018/07/18   BRCA gene mutation negative 05/04/2018   History of cesarean delivery 02/10/2018   Rh negative state in antepartum period 08/23/2017   History of eclampsia 07/22/2017   Migraines 04/09/2017   GAD (generalized anxiety disorder) 12/01/2014    PCP: Montel Culver, MD  REFERRING PROVIDER: Charlynne Cousins, NP  REFERRING DIAGNOSIS: Neck pain  THERAPY DIAG: Cervicalgia  Muscle weakness (generalized)  ONSET DATE: 06/22/2018 (approximate)  FOLLOW UP APPT WITH PROVIDER: Yes   FROM INITIAL EVALUATION  SUBJECTIVE:  Chief Complaint: Neck pain  Pertinent History Pt reports neck pain for 2-3 years. Insidious onset with no history of trauma to head/neck. Initially she thought the neck pain was related to her migraines but the migraines eventually were well managed and the neck pain continued. Pain radiates into the L upper arm however she experiences tingling in the entire L hand. She has chronic L shoulder pain and has difficulty lifting her kids, occasionally feeling a "catch" in her shoulder. Pt has received steroid  injections in her left shoulder with benefit but has never had any neck injections. She has chronic low back pain and has had lumbar injections. Pt reports intermittent migraines (6-7/month) and she is not currently taking any prophylactic migraine medications however does utilize rescue medications. Neurology wanted her to switch to [facility] but the cost was prohibitive. Per patient she is waiting to hear from neurology regarding insurance coverage/assistance. She gets a visual aura with her migraines and her triggers include noises, bright lights (TV and sunlight), and seasonal allergies. No known food triggers but pt has never kept a food diary or paid attention to any correlation with her diet. She had a CT scan of her head on 03/30/15 which was negative for any acute abnormalities per medical record. Recent cervical MRI showed mild cervical spondylosis. Mild multilevel disc degeneration, greatest at C4-C5 and C5-C6. No more than mild relative spinal canal narrowing. No significant foraminal stenosis. Nonspecific straightening of the expected cervical lordosis.  Pain:  Pain Intensity: Present: 2/10, Best: 1/10 Pain location: Bilateral suboccipital region radiating down sides of neck Pain Quality: aching Radiating: Yes  Numbness/Tingling: Yes Focal Weakness: Yes, L shoulder weakness but unclear if it is related to neck  Aggravating factors: End range cervical rotation, sitting in the recliner Relieving factors: Really hot bath, TENS unit, trigger point release from husband helps, neck support 24-hour pain behavior: Worsens as day progresses History of prior neck injury, pain, surgery, or therapy: None Follow-up appointment with MD: Yes, 07/12/19 with ortho for trigger point injections Dominant hand: left Imaging: Yes, see history Prior level of function: Independent 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  Recent memory is intact.  Remote memory is intact.  Attention span and concentration are intact.  Expressive speech is intact.  Patient's fund of knowledge is within normal limits for educational level.     Gross Musculoskeletal Assessment Tremor: None Bulk: Normal Tone: Normal  Gait Deferred full gait assessment at this time;  Posture Forward head and rounded shoulders in sitting with mild thoracic kyphosis;   AROM  AROM (Normal range in degrees) AROM 08/30/2021  Cervical  Flexion (50) 55*  Extension (80) 38*  Right lateral flexion (45) 40  Left lateral flexion (45) 40  Right rotation (85) 58*  Left rotation (85) 54*  (* = pain; Blank rows = not tested)  PROM: Deferred  MMT  MMT (out of 5) Right 08/30/2021 Left 08/30/2021  Cervical (isometric)  Flexion WNL  Extension WNL  Lateral Flexion WNL WNL  Rotation WNL WNL      Shoulder   Flexion 5 4+*  Extension    Abduction 5 5  Internal rotation (seated) 5 5  Horizontal abduction (seated) 5  5  Horizontal adduction    Lower Trapezius    Rhomboids        Elbow  Flexion 5 5  Extension 5 5  Pronation    Supination        Wrist  Flexion 5 5  Extension 5 5  Radial deviation    Ulnar deviation        MCP  Flexion 5 5  Extension 5 5  Abduction    Adduction    (* = pain; Blank rows = not tested)  Sensation Deferred, to be performed at follow-up visit;  Reflexes Deferred   Palpation  Location LEFT  RIGHT           Suboccipitals 2 2  Cervical paraspinals 2 2  Upper Trapezius 2 2  Levator Scapulae 1 1  Rhomboid Major/Minor 1 1  (Blank rows = not tested) Graded on 0-4 scale (0 = no pain, 1 = pain, 2 = pain with wincing/grimacing/flinching, 3 = pain with withdrawal, 4 = unwilling to allow palpation),  (Blank rows = not tested)   Repeated Movements Deferred   Passive Accessory Intervertebral Motion Pt denies reproduction of neck pain with CPA C2-T1 however is tender to pressure on her spinous processes. She reports pain with pressure along cervical paraspinals so unable to assess unilateral passive accessory mobility.     SPECIAL TESTS Spurlings A (ipsilateral lateral flexion/axial compression): R: Negative L: Negative Spurlings B (ipsilateral lateral flexion/contralateral rotation/axial compression): R: Negative L: Negative Distraction Test: Positive for discomfort  Hoffman Sign (cervical cord compression): R: Negative L: Negative ULTT Median: R: Not examined L: Not examined ULTT Ulnar: R: Not examined L: Not examined ULTT Radial: R: Not examined L: Not examined    TODAY'S TREATMENT   SUBJECTIVE: Pt reports that she is doing well today. She denies any headache or neck pain upon arrival at rest but she does have some neck tenderness. No specific questions or concerns currently.   Pain: Denies   Printmaker  Custom symmetric biphasic estim applied to cervical paraspinals/occipital nerves at 100Hz  and 400 microseconds using one channel at pt tolerated intensity of 6.0 x 15 minutes with heat pack applied to posterior cervical spine during therapy session.     Manual Therapy  Prone grade I-II CPA mobilizations C2-C3, 30s/bout x 3 bouts/level; Prone grade I-II UPA mobilizations C2-C3, 30s/bout x 2 bouts/level bilaterally; Prone C1 lateral mass moblizations, grade I, 20s/bout x 1 bout bilaterally; Prone STM to bilateral upper traps and levator scapulae, pt sensitive to touch but less so compared to prior therapy session. Supine bilateral upper trap and lateral flexion x 45s each bilaterally; Gentle manual cervical traction 20s hold/10s relax x 3 bouts; Suboccipital release x 60s; During manual therapy applied custom symmetric biphasic estim applied on forehead over  eyes for supraorbital branch of trigeminal nerve stimulation at 100Hz  and 400 microseconds using one channel at pt tolerated intensity of 5.5 x 15 minutes.     PATIENT EDUCATION:  Education details: Plan of care Person educated: Patient Education method: Explanation and handout Education comprehension: verbalized understanding   HOME EXERCISE PROGRAM: Continue neck stretches   ASSESSMENT:  CLINICAL IMPRESSION:  Continued manual techniques including upper cervical mobilizations and stretches. Utilized estim over upper cervical spine to target occipital nerve and on forehead to target supraorbital branch of trigeminal nerve during session. Utilized manual traction today in order to conserve time. Pt encouraged to follow-up as scheduled. Pt will benefit from PT services to address deficits in  pain in order to return to full function at home.   REHAB POTENTIAL: Good  CLINICAL DECISION MAKING: Evolving/moderate complexity  EVALUATION COMPLEXITY: Moderate   GOALS: Goals reviewed with patient? Yes  SHORT TERM GOALS: Target date: 09/27/2021  Pt will be independent with HEP to improve strength and range of motion as well as decrease neck pain to improve pain-free function at home and work. Baseline:  Goal status: INITIAL   LONG TERM GOALS: Target date: 10/25/2021  Pt will increase FOTO to at least 63 to demonstrate significant improvement in function at home and work related to neck pain  Baseline: 06/21/21: 56; 08/23/21: 54 Goal status: INITIAL  2.  Pt will decrease worst neck pain by at least 2 points on the NPRS in order to demonstrate clinically significant reduction in neck pain. Baseline: 08/23/21: 5/10 Goal status: INITIAL  3.  Pt will decrease NDI score by at least 19% in order demonstrate clinically significant reduction in neck pain/disability.     Baseline: 06/21/21: To be completed at next session; 08/23/21: 26% Goal status: INITIAL   PLAN: PT FREQUENCY: 2x/week  PT  DURATION: 8 weeks  PLANNED INTERVENTIONS: Therapeutic exercises, Therapeutic activity, Neuromuscular re-education, Balance training, Gait training, Patient/Family education, Joint manipulation, Joint mobilization, Vestibular training, Canalith repositioning, Dry Needling, Electrical stimulation, Spinal manipulation, Spinal mobilization, Cryotherapy, Moist heat, Taping, Traction, Ultrasound, Ionotophoresis 81m/ml Dexamethasone, and Manual therapy  PLAN FOR NEXT SESSION: upper cervical mobilizations, STM, cervical and upper quarter strengthening;  JLyndel SafeHuprich PT, DPT, GCS  Kallen Delatorre 08/30/2021, 12:25 PM

## 2021-09-06 ENCOUNTER — Ambulatory Visit: Payer: Medicaid Other

## 2021-09-11 ENCOUNTER — Ambulatory Visit (HOSPITAL_COMMUNITY): Payer: 59 | Admitting: Licensed Clinical Social Worker

## 2021-09-14 ENCOUNTER — Ambulatory Visit: Payer: Medicaid Other

## 2021-09-17 DIAGNOSIS — E531 Pyridoxine deficiency: Secondary | ICD-10-CM | POA: Diagnosis not present

## 2021-09-17 DIAGNOSIS — Z79899 Other long term (current) drug therapy: Secondary | ICD-10-CM | POA: Diagnosis not present

## 2021-09-17 DIAGNOSIS — R200 Anesthesia of skin: Secondary | ICD-10-CM | POA: Diagnosis not present

## 2021-09-17 DIAGNOSIS — E519 Thiamine deficiency, unspecified: Secondary | ICD-10-CM | POA: Diagnosis not present

## 2021-09-17 DIAGNOSIS — G43719 Chronic migraine without aura, intractable, without status migrainosus: Secondary | ICD-10-CM | POA: Diagnosis not present

## 2021-09-17 DIAGNOSIS — E559 Vitamin D deficiency, unspecified: Secondary | ICD-10-CM | POA: Diagnosis not present

## 2021-09-19 ENCOUNTER — Other Ambulatory Visit: Payer: Self-pay | Admitting: Student

## 2021-09-19 DIAGNOSIS — G43719 Chronic migraine without aura, intractable, without status migrainosus: Secondary | ICD-10-CM

## 2021-09-19 DIAGNOSIS — R200 Anesthesia of skin: Secondary | ICD-10-CM

## 2021-09-19 DIAGNOSIS — Z79899 Other long term (current) drug therapy: Secondary | ICD-10-CM

## 2021-09-19 NOTE — Therapy (Signed)
OUTPATIENT PHYSICAL THERAPY NECK TREATMENT   Patient Name: Denise Walker MRN: [id number] DOB:04-Nov-1990, 31 y.o., female Today's Date: 09/19/2021         Past Medical History:  Diagnosis Date   Anxiety    Back pain    low back pain with lumbar radiculitis   Bilateral shoulder pain    BRCA negative 04/2018   MyRisk neg; IBIS=8.9%/riskscore=6.7%   Eclampsia    Family history of ovarian cancer    Gestational diabetes    Migraine headache    PCOS (polycystic ovarian syndrome)    Past Surgical History:  Procedure Laterality Date   CESAREAN SECTION N/A 07/15/2014   Procedure: CESAREAN SECTION;  Surgeon: Gae Dry, MD;  Location: [facility] ORS;  Service: Obstetrics;  Laterality: N/A;   CESAREAN SECTION N/A 02/10/2018   Procedure: CESAREAN SECTION;  Surgeon: Will Bonnet, MD;  Location: [facility] ORS;  Service: Obstetrics;  Laterality: N/A;   CESAREAN SECTION N/A 12/31/2018   Procedure: CESAREAN SECTION;  Surgeon: [name], MD;  Location: [facility] ORS;  Service: Obstetrics;  Laterality: N/A;   CESAREAN SECTION     HERNIA REPAIR  10/2013   WISDOM TOOTH EXTRACTION     Patient Active Problem List   Diagnosis Date Noted   Mixed hyperlipidemia 06/04/2021   Prediabetes 06/04/2021   Anxiety and depression 06/04/2021   PCOS (polycystic ovarian syndrome) 05/01/2021   Chronic pain of right knee 05/01/2021   Chronic foot pain, left 05/01/2021   Bacterial infection 01/21/2021   History of influenza 01/21/2021   Bronchitis 01/21/2021   ADHD (attention deficit hyperactivity disorder), inattentive type 12/21/2020   Chronic post-traumatic stress disorder (PTSD) 12/21/2020   MDD (major depressive disorder), recurrent, in full remission ([place]) 11/15/2020   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   MDD (major depressive disorder), recurrent, in partial remission ([place]) 10/02/2020   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   Insomnia due to mental disorder  09/01/2020   Attention and concentration deficit 08/31/2020   Moderate episode of recurrent major depressive disorder ([place]) 06/29/2020   High risk medication use 06/29/2020   History of ADHD 06/29/2020   Uterine scar from previous cesarean delivery affecting pregnancy 12/31/2018   Anti-D antibodies present during pregnancy 07/10/18   BRCA gene mutation negative 05/04/2018   History of cesarean delivery 02/10/2018   Rh negative state in antepartum period 08/23/2017   History of eclampsia 07/22/2017   Migraines 04/09/2017   GAD (generalized anxiety disorder) 12/01/2014    PCP: Montel Culver, MD  REFERRING PROVIDER: Charlynne Cousins, NP  REFERRING DIAGNOSIS: Neck pain  THERAPY DIAG: Cervicalgia  Muscle weakness (generalized)  ONSET DATE: 06/22/2018 (approximate)  FOLLOW UP APPT WITH PROVIDER: Yes   FROM INITIAL EVALUATION  SUBJECTIVE:  Chief Complaint: Neck pain  Pertinent History Pt reports neck pain for 2-3 years. Insidious onset with no history of trauma to head/neck. Initially she thought the neck pain was related to her migraines but the migraines eventually were well managed and the neck pain continued. Pain radiates into the L upper arm however she experiences tingling in the entire L hand. She has chronic L shoulder pain and has difficulty lifting her kids, occasionally feeling a "catch" in her shoulder. Pt has received steroid injections in her left shoulder with benefit but has never had any neck injections. She has chronic low back pain and has had lumbar injections. Pt reports intermittent migraines (6-7/month) and she is not currently taking any prophylactic migraine medications however does utilize rescue medications. Neurology wanted her to switch to [facility] but the cost was  prohibitive. Per patient she is waiting to hear from neurology regarding insurance coverage/assistance. She gets a visual aura with her migraines and her triggers include noises, bright lights (TV and sunlight), and seasonal allergies. No known food triggers but pt has never kept a food diary or paid attention to any correlation with her diet. She had a CT scan of her head on 03/30/15 which was negative for any acute abnormalities per medical record. Recent cervical MRI showed mild cervical spondylosis. Mild multilevel disc degeneration, greatest at C4-C5 and C5-C6. No more than mild relative spinal canal narrowing. No significant foraminal stenosis. Nonspecific straightening of the expected cervical lordosis.  Pain:  Pain Intensity: Present: 2/10, Best: 1/10 Pain location: Bilateral suboccipital region radiating down sides of neck Pain Quality: aching Radiating: Yes  Numbness/Tingling: Yes Focal Weakness: Yes, L shoulder weakness but unclear if it is related to neck  Aggravating factors: End range cervical rotation, sitting in the recliner Relieving factors: Really hot bath, TENS unit, trigger point release from husband helps, neck support 24-hour pain behavior: Worsens as day progresses History of prior neck injury, pain, surgery, or therapy: None Follow-up appointment with MD: Yes, 07/12/19 with ortho for trigger point injections Dominant hand: left Imaging: Yes, see history Prior level of function: Independent 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  Recent memory is intact.  Remote memory is intact.  Attention span and concentration are intact.  Expressive speech is intact.  Patient's fund of knowledge  is within normal limits for educational level.     Gross Musculoskeletal Assessment Tremor: None Bulk: Normal Tone: Normal  Gait Deferred full gait assessment at this time;  Posture Forward head and rounded shoulders in sitting with mild thoracic kyphosis;   AROM  AROM (Normal range in degrees) AROM 09/19/2021  Cervical  Flexion (50) 55*  Extension (80) 38*  Right lateral flexion (45) 40  Left lateral flexion (45) 40  Right rotation (85) 58*  Left rotation (85) 54*  (* = pain; Blank rows = not tested)  PROM: Deferred  MMT  MMT (out of 5) Right 09/19/2021 Left 09/19/2021  Cervical (isometric)  Flexion WNL  Extension WNL  Lateral Flexion WNL WNL  Rotation WNL WNL      Shoulder   Flexion 5 4+*  Extension    Abduction 5 5  Internal rotation (seated) 5 5  Horizontal abduction (seated) 5  5  Horizontal adduction    Lower Trapezius    Rhomboids        Elbow  Flexion 5 5  Extension 5 5  Pronation    Supination        Wrist  Flexion 5 5  Extension 5 5  Radial deviation    Ulnar deviation        MCP  Flexion 5 5  Extension 5 5  Abduction    Adduction    (* = pain; Blank rows = not tested)  Sensation Deferred, to be performed at follow-up visit;  Reflexes Deferred   Palpation  Location LEFT  RIGHT           Suboccipitals 2 2  Cervical paraspinals 2 2  Upper Trapezius 2 2  Levator Scapulae 1 1  Rhomboid Major/Minor 1 1  (Blank rows = not tested) Graded on 0-4 scale (0 = no pain, 1 = pain, 2 = pain with wincing/grimacing/flinching, 3 = pain with withdrawal, 4 = unwilling to allow palpation), (Blank rows = not tested)   Repeated Movements Deferred   Passive Accessory Intervertebral Motion Pt denies reproduction of neck pain with CPA C2-T1 however is tender to pressure on her spinous processes. She reports pain with pressure along cervical paraspinals so unable to assess unilateral passive accessory mobility.     SPECIAL TESTS Spurlings A  (ipsilateral lateral flexion/axial compression): R: Negative L: Negative Spurlings B (ipsilateral lateral flexion/contralateral rotation/axial compression): R: Negative L: Negative Distraction Test: Positive for discomfort  Hoffman Sign (cervical cord compression): R: Negative L: Negative ULTT Median: R: Not examined L: Not examined ULTT Ulnar: R: Not examined L: Not examined ULTT Radial: R: Not examined L: Not examined    TODAY'S TREATMENT   SUBJECTIVE: Pt reports that she is doing well today. She denies any headache or neck pain upon arrival at rest but she does have some neck tenderness. No specific questions or concerns currently.   Pain: Denies   Printmaker  Custom symmetric biphasic estim applied to cervical paraspinals/occipital nerves at 100Hz  and 400 microseconds using one channel at pt tolerated intensity of 6.0 x 15 minutes with heat pack applied to posterior cervical spine during therapy session.     Manual Therapy  Prone grade I-II CPA mobilizations C2-C3, 30s/bout x 3 bouts/level; Prone grade I-II UPA mobilizations C2-C3, 30s/bout x 2 bouts/level bilaterally; Prone C1 lateral mass moblizations, grade I, 20s/bout x 1 bout bilaterally; Prone STM to bilateral upper traps and levator scapulae, pt sensitive to touch but less so compared to prior therapy session. Supine bilateral upper trap and lateral flexion x 45s each bilaterally; Gentle manual cervical traction 20s hold/10s relax x 3 bouts; Suboccipital release x 60s; During manual therapy applied custom symmetric biphasic estim applied on forehead over eyes for supraorbital branch of trigeminal nerve stimulation at 100Hz  and 400 microseconds using one channel at pt tolerated intensity of 5.5 x 15 minutes.     PATIENT EDUCATION:  Education details: Plan of care Person educated: Patient Education method: Explanation and handout Education comprehension: verbalized understanding   HOME EXERCISE  PROGRAM: Continue neck stretches   ASSESSMENT:  CLINICAL IMPRESSION:  Continued manual techniques including upper cervical mobilizations and stretches. Utilized estim over upper cervical spine to target occipital nerve and on forehead to target supraorbital branch of trigeminal nerve during session. Utilized manual traction today in order to conserve time. Pt encouraged to follow-up as scheduled. Pt will benefit from PT services to address deficits in  pain in order to return to full function at home.   REHAB POTENTIAL: Good  CLINICAL DECISION MAKING: Evolving/moderate complexity  EVALUATION COMPLEXITY: Moderate   GOALS: Goals reviewed with patient? Yes  SHORT TERM GOALS: Target date: 10/17/2021  Pt will be independent with HEP to improve strength and range of motion as well as decrease neck pain to improve pain-free function at home and work. Baseline:  Goal status: INITIAL   LONG TERM GOALS: Target date: 11/14/2021  Pt will increase FOTO to at least 63 to demonstrate significant improvement in function at home and work related to neck pain  Baseline: 06/21/21: 56; 08/23/21: 54 Goal status: INITIAL  2.  Pt will decrease worst neck pain by at least 2 points on the NPRS in order to demonstrate clinically significant reduction in neck pain. Baseline: 08/23/21: 5/10 Goal status: INITIAL  3.  Pt will decrease NDI score by at least 19% in order demonstrate clinically significant reduction in neck pain/disability.     Baseline: 06/21/21: To be completed at next session; 08/23/21: 26% Goal status: INITIAL   PLAN: PT FREQUENCY: 2x/week  PT DURATION: 8 weeks  PLANNED INTERVENTIONS: Therapeutic exercises, Therapeutic activity, Neuromuscular re-education, Balance training, Gait training, Patient/Family education, Joint manipulation, Joint mobilization, Vestibular training, Canalith repositioning, Dry Needling, Electrical stimulation, Spinal manipulation, Spinal mobilization, Cryotherapy,  Moist heat, Taping, Traction, Ultrasound, Ionotophoresis 78m/ml Dexamethasone, and Manual therapy  PLAN FOR NEXT SESSION: upper cervical mobilizations, STM, cervical and upper quarter strengthening;  JLyndel SafeHuprich PT, DPT, GCS  Zora Glendenning 09/19/2021, 10:12 AM

## 2021-09-20 ENCOUNTER — Ambulatory Visit: Payer: Medicaid Other | Attending: Orthopedic

## 2021-09-20 DIAGNOSIS — M542 Cervicalgia: Secondary | ICD-10-CM | POA: Diagnosis present

## 2021-09-20 DIAGNOSIS — M6281 Muscle weakness (generalized): Secondary | ICD-10-CM | POA: Diagnosis present

## 2021-09-24 ENCOUNTER — Telehealth: Payer: 59 | Admitting: Psychiatry

## 2021-09-26 NOTE — Therapy (Signed)
OUTPATIENT PHYSICAL THERAPY NECK TREATMENT  Patient Name: Arisa Congleton MRN: [id number] DOB:1990-07-25, 31 y.o., female Today's Date: 09/27/2021   PT End of Session - 09/27/21 1107     Visit Number 8    Number of Visits 33    Date for PT Re-Evaluation 10/18/21    Authorization Type eval: 06/21/21    PT Start Time 1107    PT Stop Time 1145    PT Time Calculation (min) 38 min    Activity Tolerance Patient tolerated treatment well    Behavior During Therapy [facility] for tasks assessed/performed             Past Medical History:  Diagnosis Date   Anxiety    Back pain    low back pain with lumbar radiculitis   Bilateral shoulder pain    BRCA negative 04/2018   MyRisk neg; IBIS=8.9%/riskscore=6.7%   Eclampsia    Family history of ovarian cancer    Gestational diabetes    Migraine headache    PCOS (polycystic ovarian syndrome)    Past Surgical History:  Procedure Laterality Date   CESAREAN SECTION N/A 07/15/2014   Procedure: CESAREAN SECTION;  Surgeon: Gae Dry, MD;  Location: [facility] ORS;  Service: Obstetrics;  Laterality: N/A;   CESAREAN SECTION N/A 02/10/2018   Procedure: CESAREAN SECTION;  Surgeon: Will Bonnet, MD;  Location: [facility] ORS;  Service: Obstetrics;  Laterality: N/A;   CESAREAN SECTION N/A 12/31/2018   Procedure: CESAREAN SECTION;  Surgeon: [name], MD;  Location: [facility] ORS;  Service: Obstetrics;  Laterality: N/A;   CESAREAN SECTION     HERNIA REPAIR  10/2013   WISDOM TOOTH EXTRACTION     Patient Active Problem List   Diagnosis Date Noted   Mixed hyperlipidemia 06/04/2021   Prediabetes 06/04/2021   Anxiety and depression 06/04/2021   PCOS (polycystic ovarian syndrome) 05/01/2021   Chronic pain of right knee 05/01/2021   Chronic foot pain, left 05/01/2021   Bacterial infection 01/21/2021   History of influenza 01/21/2021   Bronchitis 01/21/2021   ADHD (attention deficit hyperactivity disorder), inattentive type 12/21/2020   Chronic  post-traumatic stress disorder (PTSD) 12/21/2020   MDD (major depressive disorder), recurrent, in full remission ([place]) 11/15/2020   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   MDD (major depressive disorder), recurrent, in partial remission ([place]) 10/02/2020   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   Attention and concentration deficit 08/31/2020   Moderate episode of recurrent major depressive disorder ([place]) 06/29/2020   High risk medication use 06/29/2020   History of ADHD 06/29/2020   Uterine scar from previous cesarean delivery affecting pregnancy 12/31/2018   Anti-D antibodies present during pregnancy 07-27-2018   BRCA gene mutation negative 05/04/2018   History of cesarean delivery 02/10/2018   Rh negative state in antepartum period 08/23/2017   History of eclampsia 07/22/2017   Migraines 04/09/2017   GAD (generalized anxiety disorder) 12/01/2014    PCP: Montel Culver, MD  REFERRING PROVIDER: Charlynne Cousins, NP  REFERRING DIAGNOSIS: Neck pain  THERAPY DIAG: Cervicalgia  Muscle weakness (generalized)  ONSET DATE: 06/22/2018 (approximate)  FOLLOW UP APPT WITH PROVIDER: Yes   FROM INITIAL EVALUATION  SUBJECTIVE:  Chief Complaint: Neck pain  Pertinent History Pt reports neck pain for 2-3 years. Insidious onset with no history of trauma to head/neck. Initially she thought the neck pain was related to her migraines but the migraines eventually were well managed and the neck pain continued. Pain radiates into the L upper arm however she experiences tingling in the entire L hand. She has chronic L shoulder pain and has difficulty lifting her kids, occasionally feeling a "catch" in her shoulder. Pt has received steroid injections  in her left shoulder with benefit but has never had any neck injections. She has chronic low back pain and has had lumbar injections. Pt reports intermittent migraines (6-7/month) and she is not currently taking any prophylactic migraine medications however does utilize rescue medications. Neurology wanted her to switch to [facility] but the cost was prohibitive. Per patient she is waiting to hear from neurology regarding insurance coverage/assistance. She gets a visual aura with her migraines and her triggers include noises, bright lights (TV and sunlight), and seasonal allergies. No known food triggers but pt has never kept a food diary or paid attention to any correlation with her diet. She had a CT scan of her head on 03/30/15 which was negative for any acute abnormalities per medical record. Recent cervical MRI showed mild cervical spondylosis. Mild multilevel disc degeneration, greatest at C4-C5 and C5-C6. No more than mild relative spinal canal narrowing. No significant foraminal stenosis. Nonspecific straightening of the expected cervical lordosis.  Pain:  Pain Intensity: Present: 2/10, Best: 1/10 Pain location: Bilateral suboccipital region radiating down sides of neck Pain Quality: aching Radiating: Yes  Numbness/Tingling: Yes Focal Weakness: Yes, L shoulder weakness but unclear if it is related to neck  Aggravating factors: End range cervical rotation, sitting in the recliner Relieving factors: Really hot bath, TENS unit, trigger point release from husband helps, neck support 24-hour pain behavior: Worsens as day progresses History of prior neck injury, pain, surgery, or therapy: None Follow-up appointment with MD: Yes, 07/12/19 with ortho for trigger point injections Dominant hand: left Imaging: Yes, see history Prior level of function: Independent 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  Recent memory is intact.  Remote memory is intact.  Attention span and concentration are intact.  Expressive speech is intact.  Patient's fund of knowledge is within normal limits for educational level.     Gross Musculoskeletal Assessment Tremor: None Bulk: Normal Tone: Normal  Gait Deferred full gait assessment at this time;  Posture Forward head and rounded shoulders in sitting with mild thoracic kyphosis;   AROM  AROM (Normal range in degrees) AROM 09/27/2021  Cervical  Flexion (50) 55*  Extension (80) 38*  Right lateral flexion (45) 40  Left lateral flexion (45) 40  Right rotation (85) 58*  Left rotation (85) 54*  (* = pain; Blank rows = not tested)  PROM: Deferred  MMT  MMT (out of 5) Right 09/27/2021 Left 09/27/2021  Cervical (isometric)  Flexion WNL  Extension WNL  Lateral Flexion WNL WNL  Rotation WNL WNL      Shoulder   Flexion 5 4+*  Extension    Abduction 5 5  Internal rotation (seated) 5 5  Horizontal abduction (seated) 5  5  Horizontal adduction    Lower Trapezius    Rhomboids        Elbow  Flexion 5 5  Extension 5 5  Pronation    Supination        Wrist  Flexion 5 5  Extension 5 5  Radial deviation    Ulnar deviation        MCP  Flexion 5 5  Extension 5 5  Abduction    Adduction    (* = pain; Blank rows = not tested)  Sensation Deferred, to be performed at follow-up visit;  Reflexes Deferred   Palpation  Location LEFT  RIGHT           Suboccipitals 2 2  Cervical paraspinals 2 2  Upper Trapezius 2 2  Levator Scapulae 1 1  Rhomboid Major/Minor 1 1  (Blank rows = not tested) Graded on 0-4 scale (0 = no pain, 1 = pain, 2 = pain with wincing/grimacing/flinching, 3 = pain with withdrawal, 4 = unwilling to allow palpation), (Blank  rows = not tested)   Repeated Movements Deferred   Passive Accessory Intervertebral Motion Pt denies reproduction of neck pain with CPA C2-T1 however is tender to pressure on her spinous processes. She reports pain with pressure along cervical paraspinals so unable to assess unilateral passive accessory mobility.     SPECIAL TESTS Spurlings A (ipsilateral lateral flexion/axial compression): R: Negative L: Negative Spurlings B (ipsilateral lateral flexion/contralateral rotation/axial compression): R: Negative L: Negative Distraction Test: Positive for discomfort  Hoffman Sign (cervical cord compression): R: Negative L: Negative ULTT Median: R: Not examined L: Not examined ULTT Ulnar: R: Not examined L: Not examined ULTT Radial: R: Not examined L: Not examined    TODAY'S TREATMENT    SUBJECTIVE: Pt reports that she is doing well today. No significant changes since last therapy session. Her neck pain and headaches continue to improve. She denies any headache or neck pain upon arrival at rest and it has not really been bothering her since the last appointment. She does still report some bilateral upper trap tenderness. No specific questions or concerns currently.   Pain: Denies   Printmaker  Custom symmetric biphasic estim applied to cervical paraspinals/occipital nerves at 100Hz  and 400 microseconds using one channel at pt tolerated intensity of 6.0 x 15 minutes with heat pack applied to posterior cervical spine during therapy session.     Manual Therapy  Prone grade I-II CPA mobilizations C2-C3, 30s/bout x 3 bouts/level; Prone grade I-II UPA mobilizations C2-C3, 30s/bout x 2 bouts/level bilaterally; Supine bilateral upper trap, lateral flexion, and levator stretches 2 x 45s each bilaterally; Gentle manual cervical traction 20s hold/10s relax x 10 bouts; During manual therapy applied custom symmetric biphasic estim applied on forehead over eyes for supraorbital branch  of trigeminal nerve stimulation at 100Hz  and 400 microseconds using one channel at pt tolerated intensity of 5.0 x 15 minutes.     PATIENT EDUCATION:  Education details: Plan of care Person educated: Patient Education method: Explanation and handout Education comprehension: verbalized understanding   HOME EXERCISE PROGRAM: Continue neck stretches   ASSESSMENT:  CLINICAL IMPRESSION:  Continued manual techniques including upper cervical mobilizations and stretches. Utilized estim over upper cervical spine to target occipital nerve and on forehead to target supraorbital branch of trigeminal nerve during session. Utilized manual traction today as pt reports benefit from last session. She is making excellent progress with improvement in frequency and intensity of her neck pain/headaches. Pt encouraged to  follow-up as scheduled. Pt will benefit from PT services to address deficits in pain in order to return to full function at home.   REHAB POTENTIAL: Good  CLINICAL DECISION MAKING: Evolving/moderate complexity  EVALUATION COMPLEXITY: Moderate   GOALS: Goals reviewed with patient? Yes  SHORT TERM GOALS: Target date: 10/25/2021  Pt will be independent with HEP to improve strength and range of motion as well as decrease neck pain to improve pain-free function at home and work. Baseline:  Goal status: INITIAL   LONG TERM GOALS: Target date: 11/22/2021  Pt will increase FOTO to at least 63 to demonstrate significant improvement in function at home and work related to neck pain  Baseline: 06/21/21: 56; 08/23/21: 54 Goal status: INITIAL  2.  Pt will decrease worst neck pain by at least 2 points on the NPRS in order to demonstrate clinically significant reduction in neck pain. Baseline: 08/23/21: 5/10 Goal status: INITIAL  3.  Pt will decrease NDI score by at least 19% in order demonstrate clinically significant reduction in neck pain/disability.     Baseline: 06/21/21: To be completed  at next session; 08/23/21: 26% Goal status: INITIAL   PLAN: PT FREQUENCY: 2x/week  PT DURATION: 8 weeks  PLANNED INTERVENTIONS: Therapeutic exercises, Therapeutic activity, Neuromuscular re-education, Balance training, Gait training, Patient/Family education, Joint manipulation, Joint mobilization, Vestibular training, Canalith repositioning, Dry Needling, Electrical stimulation, Spinal manipulation, Spinal mobilization, Cryotherapy, Moist heat, Taping, Traction, Ultrasound, Ionotophoresis 49m/ml Dexamethasone, and Manual therapy  PLAN FOR NEXT SESSION: upper cervical mobilizations, STM, cervical and upper quarter strengthening;  JLyndel SafeHuprich PT, DPT, GCS  Kenai Fluegel 09/27/2021, 1:44 PM

## 2021-09-27 ENCOUNTER — Ambulatory Visit: Payer: Medicaid Other

## 2021-09-27 DIAGNOSIS — M542 Cervicalgia: Secondary | ICD-10-CM

## 2021-09-27 DIAGNOSIS — M6281 Muscle weakness (generalized): Secondary | ICD-10-CM

## 2021-10-08 ENCOUNTER — Telehealth: Payer: Self-pay | Admitting: Family Medicine

## 2021-10-08 NOTE — Telephone Encounter (Signed)
Mellissa calling from [employer] is requesting In need of constent since the doctors office filled appeal for [place] on their  [phone] Fax- [phone]

## 2021-10-09 NOTE — Telephone Encounter (Signed)
PC to [facility], asked her to call pts Neurologist for this medication as we didn't prescribe this.

## 2021-10-11 DIAGNOSIS — M542 Cervicalgia: Secondary | ICD-10-CM

## 2021-10-11 DIAGNOSIS — M6281 Muscle weakness (generalized): Secondary | ICD-10-CM

## 2021-10-18 ENCOUNTER — Telehealth: Payer: Medicaid Other | Admitting: Psychiatry

## 2021-11-26 ENCOUNTER — Telehealth: Payer: Medicaid Other | Admitting: Psychiatry

## 2021-12-05 ENCOUNTER — Encounter: Payer: Medicaid Other | Admitting: Psychology

## 2021-12-21 ENCOUNTER — Telehealth: Payer: Medicaid Other | Admitting: Physician Assistant

## 2021-12-21 DIAGNOSIS — J019 Acute sinusitis, unspecified: Secondary | ICD-10-CM | POA: Diagnosis not present

## 2021-12-21 DIAGNOSIS — B9689 Other specified bacterial agents as the cause of diseases classified elsewhere: Secondary | ICD-10-CM

## 2021-12-21 MED ORDER — FLUTICASONE PROPIONATE 50 MCG/ACT NA SUSP: 2.0000 | Freq: Every day | NASAL | 0 refills | Status: DC

## 2021-12-21 MED ORDER — AMOXICILLIN-POT CLAVULANATE 875-125 MG PO TABS: 1.0000 | ORAL_TABLET | Freq: Two times a day (BID) | ORAL | 0 refills | Status: DC

## 2021-12-21 MED ORDER — BENZONATATE 100 MG PO CAPS: 100.0000 mg | ORAL_CAPSULE | Freq: Three times a day (TID) | ORAL | 0 refills | Status: DC | PRN

## 2021-12-21 NOTE — Patient Instructions (Signed)
[name], thank you for joining Margaretann Loveless, PA-C for today's virtual visit.  While this provider is not your primary care provider (PCP), if your PCP is located in our provider database this encounter information will be shared with them immediately following your visit.   A [facility] MyChart account gives you access to today's visit and all your visits, tests, and labs performed at [facility] " click here if you don't have a [facility] MyChart account or go to [email or web address]  Consent: (Patient) Denise Walker provided verbal consent for this virtual visit at the beginning of the encounter.  Current Medications:  Current Outpatient Medications:    amoxicillin-clavulanate (AUGMENTIN) 875-125 MG tablet, Take 1 tablet by mouth 2 (two) times daily., Disp: 20 tablet, Rfl: 0   amphetamine-dextroamphetamine (ADDERALL) 30 MG tablet, Take 0.5 tablets by mouth 2 (two) times daily. Take 0.5 tablets by mouth at 7 AM and 1 PM, Disp: 30 tablet, Rfl: 0   benzonatate (TESSALON) 100 MG capsule, Take 1 capsule (100 mg total) by mouth 3 (three) times daily as needed., Disp: 30 capsule, Rfl: 0   Butalbital-APAP-Caffeine 50-325-40 MG capsule, Take by mouth., Disp: , Rfl:    clonazePAM (KLONOPIN) 0.5 MG tablet, Take 1 tablet (0.5 mg total) by mouth daily as needed for anxiety., Disp: 10 tablet, Rfl: 0   DULoxetine (CYMBALTA) 30 MG capsule, TAKE 3 CAPSULES (90 MG TOTAL) BY MOUTH EVERY EVENING., Disp: 270 capsule, Rfl: 0   fluticasone (FLONASE) 50 MCG/ACT nasal spray, Place 2 sprays into both nostrils daily., Disp: 16 g, Rfl: 0   hydrOXYzine (ATARAX) 25 MG tablet, Take 1-2 tablets (25-50 mg total) by mouth daily as needed for anxiety., Disp: 180 tablet, Rfl: 0   lamoTRIgine (LAMICTAL) 200 MG tablet, Take 1 tablet (200 mg total) by mouth daily., Disp: 90 tablet, Rfl: 0   lamoTRIgine (LAMICTAL) 25 MG tablet, Take 2 tablets (50 mg total) by mouth daily. Take along with 200 mg  daily., Disp: 180 tablet, Rfl: 0   levocetirizine (XYZAL) 5 MG tablet, Take 1 tablet (5 mg total) by mouth every evening., Disp: 30 tablet, Rfl: 0   levonorgestrel (MIRENA) 20 MCG/24HR IUD, by Intrauterine route., Disp: , Rfl:    methocarbamol (ROBAXIN) 500 MG tablet, Take 500 mg by mouth 3 (three) times daily., Disp: , Rfl:    naproxen (NAPROSYN) 500 MG tablet, 1 po bid prn (Patient not taking: Reported on 08/21/2021), Disp: , Rfl:    omeprazole (PRILOSEC) 40 MG capsule, Take 1 capsule (40 mg total) by mouth daily., Disp: 90 capsule, Rfl: 0   Vitamin D, Ergocalciferol, (DRISDOL) 1.25 MG (50000 UNIT) CAPS capsule, Take 1 capsule (50,000 Units total) by mouth every 7 (seven) days. Take for 8 total doses(weeks), Disp: 8 capsule, Rfl: 0   Medications ordered in this encounter:  Meds ordered this encounter  Medications   amoxicillin-clavulanate (AUGMENTIN) 875-125 MG tablet    Sig: Take 1 tablet by mouth 2 (two) times daily.    Dispense:  20 tablet    Refill:  0    Order Specific Question:   Supervising Provider    Answer:   Merrilee Jansky [[id number]]   fluticasone (FLONASE) 50 MCG/ACT nasal spray    Sig: Place 2 sprays into both nostrils daily.    Dispense:  16 g    Refill:  0    Order Specific Question:   Supervising Provider    Answer:   Merrilee Jansky [id number]   benzonatate (TESSALON)  100 MG capsule    Sig: Take 1 capsule (100 mg total) by mouth 3 (three) times daily as needed.    Dispense:  30 capsule    Refill:  0    Order Specific Question:   Supervising Provider    Answer:   Merrilee Jansky [id number]     *If you need refills on other medications prior to your next appointment, please contact your pharmacy*  Follow-Up: Call back or seek an in-person evaluation if the symptoms worsen or if the condition fails to improve as anticipated.  [facility] [phone]  Other Instructions Sinus Infection, Adult A sinus infection, also called sinusitis, is  inflammation of your sinuses. Sinuses are hollow spaces in the bones around your face. Your sinuses are located: Around your eyes. In the middle of your forehead. Behind your nose. In your cheekbones. Mucus normally drains out of your sinuses. When your nasal tissues become inflamed or swollen, mucus can become trapped or blocked. This allows bacteria, viruses, and fungi to grow, which leads to infection. Most infections of the sinuses are caused by a virus. A sinus infection can develop quickly. It can last for up to 4 weeks (acute) or for more than 12 weeks (chronic). A sinus infection often develops after a cold. What are the causes? This condition is caused by anything that creates swelling in the sinuses or stops mucus from draining. This includes: Allergies. Asthma. Infection from bacteria or viruses. Deformities or blockages in your nose or sinuses. Abnormal growths in the nose (nasal polyps). Pollutants, such as chemicals or irritants in the air. Infection from fungi. This is rare. What increases the risk? You are more likely to develop this condition if you: Have a weak body defense system (immune system). Do a lot of swimming or diving. Overuse nasal sprays. Smoke. What are the signs or symptoms? The main symptoms of this condition are pain and a feeling of pressure around the affected sinuses. Other symptoms include: Stuffy nose or congestion that makes it difficult to breathe through your nose. Thick yellow or greenish drainage from your nose. Tenderness, swelling, and warmth over the affected sinuses. A cough that may get worse at night. Decreased sense of smell and taste. Extra mucus that collects in the throat or the back of the nose (postnasal drip) causing a sore throat or bad breath. Tiredness (fatigue). Fever. How is this diagnosed? This condition is diagnosed based on: Your symptoms. Your medical history. A physical exam. Tests to find out if your condition is  acute or chronic. This may include: Checking your nose for nasal polyps. Viewing your sinuses using a device that has a light (endoscope). Testing for allergies or bacteria. Imaging tests, such as an MRI or CT scan. In rare cases, a bone biopsy may be done to rule out more serious types of fungal sinus disease. How is this treated? Treatment for a sinus infection depends on the cause and whether your condition is chronic or acute. If caused by a virus, your symptoms should go away on their own within 10 days. You may be given medicines to relieve symptoms. They include: Medicines that shrink swollen nasal passages (decongestants). A spray that eases inflammation of the nostrils (topical intranasal corticosteroids). Rinses that help get rid of thick mucus in your nose (nasal saline washes). Medicines that treat allergies (antihistamines). Over-the-counter pain relievers. If caused by bacteria, your health care provider may recommend waiting to see if your symptoms improve. Most bacterial infections  will get better without antibiotic medicine. You may be given antibiotics if you have: A severe infection. A weak immune system. If caused by narrow nasal passages or nasal polyps, surgery may be needed. Follow these instructions at home: Medicines Take, use, or apply over-the-counter and prescription medicines only as told by your health care provider. These may include nasal sprays. If you were prescribed an antibiotic medicine, take it as told by your health care provider. Do not stop taking the antibiotic even if you start to feel better. Hydrate and humidify  Drink enough fluid to keep your urine pale yellow. Staying hydrated will help to thin your mucus. Use a cool mist humidifier to keep the humidity level in your home above 50%. Inhale steam for 10-15 minutes, 3-4 times a day, or as told by your health care provider. You can do this in the bathroom while a hot shower is running. Limit  your exposure to cool or dry air. Rest Rest as much as possible. Sleep with your head raised (elevated). Make sure you get enough sleep each night. General instructions  Apply a warm, moist washcloth to your face 3-4 times a day or as told by your health care provider. This will help with discomfort. Use nasal saline washes as often as told by your health care provider. Wash your hands often with soap and water to reduce your exposure to germs. If soap and water are not available, use hand sanitizer. Do not smoke. Avoid being around people who are smoking (secondhand smoke). Keep all follow-up visits. This is important. Contact a health care provider if: You have a fever. Your symptoms get worse. Your symptoms do not improve within 10 days. Get help right away if: You have a severe headache. You have persistent vomiting. You have severe pain or swelling around your face or eyes. You have vision problems. You develop confusion. Your neck is stiff. You have trouble breathing. These symptoms may be an emergency. Get help right away. Call 911. Do not wait to see if the symptoms will go away. Do not drive yourself to the hospital. Summary A sinus infection is soreness and inflammation of your sinuses. Sinuses are hollow spaces in the bones around your face. This condition is caused by nasal tissues that become inflamed or swollen. The swelling traps or blocks the flow of mucus. This allows bacteria, viruses, and fungi to grow, which leads to infection. If you were prescribed an antibiotic medicine, take it as told by your health care provider. Do not stop taking the antibiotic even if you start to feel better. Keep all follow-up visits. This is important. This information is not intended to replace advice given to you by your health care provider. Make sure you discuss any questions you have with your health care provider. Document Revised: 01/02/2021 Document Reviewed:  01/02/2021 Elsevier Patient Education  2023 Elsevier Inc.    If you have been instructed to have an in-person evaluation today at a local Urgent Care facility, please use the link below. It will take you to a list of all of our available [facility], including address, phone number and hours of operation. Please do not delay care.  [facility]  If you or a family member do not have a primary care provider, use the link below to schedule a visit and establish care. When you choose a [facility] primary care physician or advanced practice provider, you gain a long-term partner in health. Find a  Primary Care Provider  Learn more about [facility]'s in-office and virtual care options: [facility] - Get Care Now

## 2021-12-21 NOTE — Progress Notes (Signed)
Virtual Visit Consent   Denise Walker, you are scheduled for a virtual visit with a [facility] provider today. Just as with appointments in the office, your consent must be obtained to participate. Your consent will be active for this visit and any virtual visit you may have with one of our providers in the next 365 days. If you have a MyChart account, a copy of this consent can be sent to you electronically.  As this is a virtual visit, video technology does not allow for your provider to perform a traditional examination. This may limit your provider's ability to fully assess your condition. If your provider identifies any concerns that need to be evaluated in person or the need to arrange testing (such as labs, EKG, etc.), we will make arrangements to do so. Although advances in technology are sophisticated, we cannot ensure that it will always work on either your end or our end. If the connection with a video visit is poor, the visit may have to be switched to a telephone visit. With either a video or telephone visit, we are not always able to ensure that we have a secure connection.  By engaging in this virtual visit, you consent to the provision of healthcare and authorize for your insurance to be billed (if applicable) for the services provided during this visit. Depending on your insurance coverage, you may receive a charge related to this service.  I need to obtain your verbal consent now. Are you willing to proceed with your visit today? Zenna Traister has provided verbal consent on 12/21/2021 for a virtual visit (video or telephone). Mar Daring, PA-C  Date: 12/21/2021 9:54 AM  Virtual Visit via Video Note   I, Mar Daring, connected with  Malaak Stach  ([id number], 04/05/1990) on 12/21/21 at  9:45 AM EST by a video-enabled telemedicine application and verified that I am speaking with the correct person using two identifiers.  Location: Patient: Virtual Visit Location  Patient: Home Provider: Virtual Visit Location Provider: Home Office   I discussed the limitations of evaluation and management by telemedicine and the availability of in person appointments. The patient expressed understanding and agreed to proceed.    History of Present Illness: Denise Walker is a 31 y.o. who identifies as a female who was assigned female at birth, and is being seen today for URI symptoms triggering migraine.  HPI: URI  This is a new problem. The current episode started in the past 7 days. The problem has been gradually worsening. The maximum temperature recorded prior to her arrival was 100.4 - 100.9 F (100.6). The fever has been present for 1 to 2 days. Associated symptoms include congestion, coughing, ear pain, headaches, a plugged ear sensation, rhinorrhea, sinus pain and a sore throat. Associated symptoms comments: Tooth pain. Treatments tried: saline nasal rinse made worse, dayquil honey, hot tea, honey lozenges, chloraseptic spray. The treatment provided no relief.   At home covid 19 testing is negative  Problems:  Patient Active Problem List   Diagnosis Date Noted   Mixed hyperlipidemia 06/04/2021   Prediabetes 06/04/2021   Anxiety and depression 06/04/2021   PCOS (polycystic ovarian syndrome) 05/01/2021   Chronic pain of right knee 05/01/2021   Chronic foot pain, left 05/01/2021   Bacterial infection 01/21/2021   History of influenza 01/21/2021   Bronchitis 01/21/2021   ADHD (attention deficit hyperactivity disorder), inattentive type 12/21/2020   Chronic post-traumatic stress disorder (PTSD) 12/21/2020   MDD (major depressive disorder), recurrent, in full  remission ([place]) 11/15/2020   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   MDD (major depressive disorder), recurrent, in partial remission ([place]) 10/02/2020   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   Attention and concentration deficit  08/31/2020   Moderate episode of recurrent major depressive disorder ([place]) 06/29/2020   High risk medication use 06/29/2020   History of ADHD 06/29/2020   Uterine scar from previous cesarean delivery affecting pregnancy 12/31/2018   Anti-D antibodies present during pregnancy 15-Jul-2018   BRCA gene mutation negative 05/04/2018   History of cesarean delivery 02/10/2018   Rh negative state in antepartum period 08/23/2017   History of eclampsia 07/22/2017   Migraines 04/09/2017   GAD (generalized anxiety disorder) 12/01/2014    Allergies:  Allergies  Allergen Reactions   Fish Allergy Shortness Of Breath   Shellfish Allergy Shortness Of Breath   Rizatriptan Other (See Comments)    "Head pressure, neck locking up" "Head pressure, neck locking up"   Medications:  Current Outpatient Medications:    amoxicillin-clavulanate (AUGMENTIN) 875-125 MG tablet, Take 1 tablet by mouth 2 (two) times daily., Disp: 20 tablet, Rfl: 0   amphetamine-dextroamphetamine (ADDERALL) 30 MG tablet, Take 0.5 tablets by mouth 2 (two) times daily. Take 0.5 tablets by mouth at 7 AM and 1 PM, Disp: 30 tablet, Rfl: 0   benzonatate (TESSALON) 100 MG capsule, Take 1 capsule (100 mg total) by mouth 3 (three) times daily as needed., Disp: 30 capsule, Rfl: 0   Butalbital-APAP-Caffeine 50-325-40 MG capsule, Take by mouth., Disp: , Rfl:    clonazePAM (KLONOPIN) 0.5 MG tablet, Take 1 tablet (0.5 mg total) by mouth daily as needed for anxiety., Disp: 10 tablet, Rfl: 0   DULoxetine (CYMBALTA) 30 MG capsule, TAKE 3 CAPSULES (90 MG TOTAL) BY MOUTH EVERY EVENING., Disp: 270 capsule, Rfl: 0   fluticasone (FLONASE) 50 MCG/ACT nasal spray, Place 2 sprays into both nostrils daily., Disp: 16 g, Rfl: 0   hydrOXYzine (ATARAX) 25 MG tablet, Take 1-2 tablets (25-50 mg total) by mouth daily as needed for anxiety., Disp: 180 tablet, Rfl: 0   lamoTRIgine (LAMICTAL) 200 MG tablet, Take 1 tablet (200 mg total) by mouth daily., Disp: 90 tablet,  Rfl: 0   lamoTRIgine (LAMICTAL) 25 MG tablet, Take 2 tablets (50 mg total) by mouth daily. Take along with 200 mg daily., Disp: 180 tablet, Rfl: 0   levocetirizine (XYZAL) 5 MG tablet, Take 1 tablet (5 mg total) by mouth every evening., Disp: 30 tablet, Rfl: 0   levonorgestrel (MIRENA) 20 MCG/24HR IUD, by Intrauterine route., Disp: , Rfl:    methocarbamol (ROBAXIN) 500 MG tablet, Take 500 mg by mouth 3 (three) times daily., Disp: , Rfl:    naproxen (NAPROSYN) 500 MG tablet, 1 po bid prn (Patient not taking: Reported on 08/21/2021), Disp: , Rfl:    omeprazole (PRILOSEC) 40 MG capsule, Take 1 capsule (40 mg total) by mouth daily., Disp: 90 capsule, Rfl: 0   Vitamin D, Ergocalciferol, (DRISDOL) 1.25 MG (50000 UNIT) CAPS capsule, Take 1 capsule (50,000 Units total) by mouth every 7 (seven) days. Take for 8 total doses(weeks), Disp: 8 capsule, Rfl: 0  Observations/Objective: Patient is well-developed, well-nourished in no acute distress.  Resting comfortably at home.  Head is normocephalic, atraumatic.  No labored breathing.  Speech is clear and coherent with logical content.  Patient is alert and oriented at baseline.    Assessment and Plan: 1. Acute bacterial sinusitis - amoxicillin-clavulanate (AUGMENTIN) 875-125 MG tablet;  Take 1 tablet by mouth 2 (two) times daily.  Dispense: 20 tablet; Refill: 0 - fluticasone (FLONASE) 50 MCG/ACT nasal spray; Place 2 sprays into both nostrils daily.  Dispense: 16 g; Refill: 0 - benzonatate (TESSALON) 100 MG capsule; Take 1 capsule (100 mg total) by mouth 3 (three) times daily as needed.  Dispense: 30 capsule; Refill: 0  - Worsening symptoms that have not responded to OTC medications.  - Will give Augmentin and Flonase - Continue allergy medications.  - Steam and humidifier can help - Stay well hydrated and get plenty of rest.  - Seek in person evaluation if no symptom improvement or if symptoms worsen   Follow Up Instructions: I discussed the  assessment and treatment plan with the patient. The patient was provided an opportunity to ask questions and all were answered. The patient agreed with the plan and demonstrated an understanding of the instructions.  A copy of instructions were sent to the patient via MyChart unless otherwise noted below.    The patient was advised to call back or seek an in-person evaluation if the symptoms worsen or if the condition fails to improve as anticipated.  Time:  I spent 11 minutes with the patient via telehealth technology discussing the above problems/concerns.    Mar Daring, PA-C

## 2022-02-06 ENCOUNTER — Ambulatory Visit: Payer: Medicaid Other | Admitting: Family Medicine

## 2022-02-06 ENCOUNTER — Encounter: Payer: Self-pay | Admitting: Family Medicine

## 2022-02-06 VITALS — BP 113/75 | HR 84 | Ht 59.0 in | Wt 227.0 lb

## 2022-02-06 DIAGNOSIS — S86112A Strain of other muscle(s) and tendon(s) of posterior muscle group at lower leg level, left leg, initial encounter: Secondary | ICD-10-CM | POA: Diagnosis not present

## 2022-02-06 DIAGNOSIS — Z7689 Persons encountering health services in other specified circumstances: Secondary | ICD-10-CM | POA: Diagnosis not present

## 2022-02-06 MED ORDER — MELOXICAM 15 MG PO TABS: 15.0000 mg | ORAL_TABLET | Freq: Every day | ORAL | 0 refills | Status: DC

## 2022-02-06 NOTE — Patient Instructions (Addendum)
-   Use meloxicam daily with food - Can wrap daily, remove at bedtime - If beneficial, can obtain calf sleeve online - Start home exercises - Return in 6 weeks for physical  Look into following medications for insurance coverage: Reginia Forts - [id number] - Bernie Covey

## 2022-02-07 ENCOUNTER — Other Ambulatory Visit: Payer: Self-pay | Admitting: Family Medicine

## 2022-02-07 ENCOUNTER — Encounter: Payer: Self-pay | Admitting: Family Medicine

## 2022-02-07 MED ORDER — METHOCARBAMOL 500 MG PO TABS: 500.0000 mg | ORAL_TABLET | Freq: Every evening | ORAL | 0 refills | Status: DC | PRN

## 2022-02-07 NOTE — Telephone Encounter (Signed)
Please advise 

## 2022-02-18 DIAGNOSIS — Z7689 Persons encountering health services in other specified circumstances: Secondary | ICD-10-CM | POA: Insufficient documentation

## 2022-02-18 DIAGNOSIS — S86112A Strain of other muscle(s) and tendon(s) of posterior muscle group at lower leg level, left leg, initial encounter: Secondary | ICD-10-CM | POA: Insufficient documentation
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
## 2022-02-18 NOTE — Assessment & Plan Note (Signed)
Reviewed various treatment options and will have patient look into options, can initiate at follow-up.
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
## 2022-03-21 ENCOUNTER — Encounter: Payer: Self-pay | Admitting: Family Medicine

## 2022-03-21 ENCOUNTER — Ambulatory Visit: Payer: Medicaid Other | Admitting: Family Medicine

## 2022-03-21 VITALS — BP 118/80 | HR 93 | Ht 59.0 in | Wt 224.4 lb

## 2022-03-21 DIAGNOSIS — E559 Vitamin D deficiency, unspecified: Secondary | ICD-10-CM | POA: Diagnosis not present

## 2022-03-21 DIAGNOSIS — E6609 Other obesity due to excess calories: Secondary | ICD-10-CM | POA: Insufficient documentation

## 2022-03-21 DIAGNOSIS — Z6841 Body Mass Index (BMI) 40.0 and over, adult: Secondary | ICD-10-CM | POA: Diagnosis not present

## 2022-03-21 DIAGNOSIS — F32A Depression, unspecified: Secondary | ICD-10-CM | POA: Diagnosis not present

## 2022-03-21 DIAGNOSIS — Z0000 Encounter for general adult medical examination without abnormal findings: Secondary | ICD-10-CM | POA: Diagnosis not present

## 2022-03-21 DIAGNOSIS — S86112D Strain of other muscle(s) and tendon(s) of posterior muscle group at lower leg level, left leg, subsequent encounter: Secondary | ICD-10-CM | POA: Diagnosis not present

## 2022-03-21 DIAGNOSIS — Z13220 Encounter for screening for lipoid disorders: Secondary | ICD-10-CM

## 2022-03-21 DIAGNOSIS — F419 Anxiety disorder, unspecified: Secondary | ICD-10-CM | POA: Diagnosis not present

## 2022-03-21 NOTE — Assessment & Plan Note (Signed)
Being managed by psychiatry, continue to follow peripherally.

## 2022-03-21 NOTE — Assessment & Plan Note (Signed)
Interim resolution of symptoms, while on meloxicam she noted and benefit of relief at the neck, low back, knees.  Discussed continued dosing of meloxicam for these issues after labs obtained to assess hepatorenal function.  Will return in 1 month for further optimization of her musculoskeletal concerns.

## 2022-03-21 NOTE — Patient Instructions (Addendum)
-   Obtain fasting labs with orders provided (can have water or black coffee but otherwise no food or drink x 8 hours before labs) - Review information provided - Attend eye doctor annually, dentist every 6 months, work towards or maintain 30 minutes of moderate intensity physical activity at least 5 days per week, and consume a balanced diet - Return in 1 month - Contact us for any questions between now and then  Additionally: - Check with insurance company about alternate weight loss medications - Can restart meloxicam after lab check

## 2022-03-21 NOTE — Assessment & Plan Note (Signed)
Patient is checking with insurance company regarding pharmacotherapeutic options for weight loss, interim lifestyle measures encouraged.  Lastly, referral to telemedicine nutrition services placed.

## 2022-03-21 NOTE — Progress Notes (Signed)
Annual Physical Exam Visit  Patient Information:  Patient ID: Denise Walker, female DOB: 14-Jan-1991 Age: 32 y.o. MRN: [id number]   Subjective:   CC: Annual Physical Exam  HPI:  Denise Walker is here for their annual physical.  I reviewed the past medical history, family history, social history, surgical history, and allergies today and changes were made as necessary.  Please see the problem list section below for additional details.  Past Medical History: Past Medical History:  Diagnosis Date   Anxiety    Back pain    low back pain with lumbar radiculitis   Bilateral shoulder pain    BRCA negative 04/2018   MyRisk neg; IBIS=8.9%/riskscore=6.7%   Eclampsia    Family history of ovarian cancer    Gestational diabetes    Migraine headache    PCOS (polycystic ovarian syndrome)    Past Surgical History: Past Surgical History:  Procedure Laterality Date   CESAREAN SECTION N/A 07/15/2014   Procedure: CESAREAN SECTION;  Surgeon: Gae Dry, MD;  Location: [facility] ORS;  Service: Obstetrics;  Laterality: N/A;   CESAREAN SECTION N/A 02/10/2018   Procedure: CESAREAN SECTION;  Surgeon: Will Bonnet, MD;  Location: [facility] ORS;  Service: Obstetrics;  Laterality: N/A;   CESAREAN SECTION N/A 12/31/2018   Procedure: CESAREAN SECTION;  Surgeon: [name], MD;  Location: [facility] ORS;  Service: Obstetrics;  Laterality: N/A;   CESAREAN SECTION     HERNIA REPAIR  10/2013   WISDOM TOOTH EXTRACTION     Family History: Family History  Problem Relation Age of Onset   Bipolar disorder Mother    Ovarian cancer Mother    Arthritis Mother    Ovarian cancer Maternal Aunt    Diabetes Paternal Aunt    Breast cancer Maternal Grandmother    Diabetes Paternal Grandfather    Uterine cancer Cousin    ADD / ADHD Daughter    ODD Daughter    Allergies: Allergies  Allergen Reactions   Fish Allergy Shortness Of Breath   Shellfish Allergy Shortness Of Breath   Rizatriptan Other (See  Comments)    "Head pressure, neck locking up" "Head pressure, neck locking up"   Health Maintenance: Health Maintenance  Topic Date Due   INFLUENZA VACCINE  05/12/2022 (Originally 09/11/2021)   COVID-19 Vaccine (1) 02/22/2025 (Originally 10/31/1993)   PAP SMEAR-Modifier  09/08/2023   DTaP/Tdap/Td (2 - Td or Tdap) 11/18/2028   Hepatitis C Screening  Completed   HIV Screening  Completed   HPV VACCINES  Aged Out    HM Colonoscopy     This patient has no relevant Health Maintenance data.      Medications: Current Outpatient Medications on File Prior to Visit  Medication Sig Dispense Refill   amphetamine-dextroamphetamine (ADDERALL) 30 MG tablet Take 0.5 tablets by mouth 2 (two) times daily. Take 0.5 tablets by mouth at 7 AM and 1 PM (Patient not taking: Reported on 02/06/2022) 30 tablet 0   Butalbital-APAP-Caffeine 50-325-40 MG capsule Take by mouth. (Patient not taking: Reported on 02/06/2022)     fluticasone (FLONASE) 50 MCG/ACT nasal spray Place 2 sprays into both nostrils daily. 16 g 0   Galcanezumab-gnlm 120 MG/ML SOAJ Inject into the skin. (Patient not taking: Reported on 02/06/2022)     hydrOXYzine (ATARAX) 25 MG tablet Take 1-2 tablets (25-50 mg total) by mouth daily as needed for anxiety. (Patient not taking: Reported on 02/06/2022) 180 tablet 0   levonorgestrel (MIRENA) 20 MCG/24HR IUD by Intrauterine route.  meloxicam (MOBIC) 15 MG tablet Take 1 tablet (15 mg total) by mouth daily. 45 tablet 0   methocarbamol (ROBAXIN) 500 MG tablet Take 1 tablet (500 mg total) by mouth at bedtime as needed for muscle spasms. 45 tablet 0   omeprazole (PRILOSEC) 40 MG capsule Take 1 capsule (40 mg total) by mouth daily. 90 capsule 0   No current facility-administered medications on file prior to visit.    Review of Systems: No headache, visual changes, nausea, vomiting, diarrhea, constipation, dizziness, abdominal pain, skin rash, fevers, chills, night sweats, swollen lymph nodes, weight  loss, chest pain, body aches, joint swelling, muscle aches, shortness of breath, mood changes, visual or auditory hallucinations reported.  Objective:   Vitals:   03/21/22 1558  BP: 118/80  Pulse: 93  SpO2: 99%   Vitals:   03/21/22 1558  Weight: 224 lb 6.4 oz (101.8 kg)  Height: 4\' 11"  (1.499 m)   Body mass index is 45.32 kg/m.  General: Well Developed, well nourished, and in no acute distress.  Neuro: Alert and oriented x3, extra-ocular muscles intact, sensation grossly intact. Cranial nerves II through XII are grossly intact, motor, sensory, and coordinative functions are intact. HEENT: Normocephalic, atraumatic, pupils equal round reactive to light, neck supple, no masses, no lymphadenopathy, thyroid nonpalpable. Oropharynx, nasopharynx, external ear canals are unremarkable. Skin: Warm and dry, no rashes noted.  Cardiac: Regular rate and rhythm, no murmurs rubs or gallops. No peripheral edema. Pulses symmetric. Respiratory: Clear to auscultation bilaterally. Not using accessory muscles, speaking in full sentences.  Abdominal: Soft, nontender, nondistended, positive bowel sounds, no masses, no organomegaly. Musculoskeletal: Shoulder, elbow, wrist, hip, knee, ankle stable, and with full range of motion.  Female chaperone initials: KG present throughout the physical examination.  Impression and Recommendations:   The patient was counselled, risk factors were discussed, and anticipatory guidance given.  Problem List Items Addressed This Visit       Musculoskeletal and Integument   Strain of gastrocnemius muscle of left lower extremity    Interim resolution of symptoms, while on meloxicam she noted and benefit of relief at the neck, low back, knees.  Discussed continued dosing of meloxicam for these issues after labs obtained to assess hepatorenal function.  Will return in 1 month for further optimization of her musculoskeletal concerns.        Other   BMI 45.0-49.9, adult  [facility])    Patient is checking with insurance company regarding pharmacotherapeutic options for weight loss, interim lifestyle measures encouraged.  Lastly, referral to telemedicine nutrition services placed.      Relevant Orders   Amb ref to Medical Nutrition Therapy-MNT   Anxiety and depression    Being managed by psychiatry, continue to follow peripherally.      Annual physical exam - Primary    Annual examination completed, risk stratification labs ordered, anticipatory guidance provided.  We will follow labs once resulted.      Relevant Orders   CBC   Comprehensive metabolic panel   Lipid panel   TSH   VITAMIN D 25 Hydroxy (Vit-D Deficiency, Fractures)   Other Visit Diagnoses     Screening for lipoid disorders       Relevant Orders   Comprehensive metabolic panel   Lipid panel   Vitamin D deficiency       Relevant Orders   VITAMIN D 25 Hydroxy (Vit-D Deficiency, Fractures)        Orders & Medications Medications: No orders of the defined types were placed  in this encounter.  Orders Placed This Encounter  Procedures   CBC   Comprehensive metabolic panel   Lipid panel   TSH   VITAMIN D 25 Hydroxy (Vit-D Deficiency, Fractures)   Amb ref to Medical Nutrition Therapy-[facility]     Return in about 4 weeks (around 04/18/2022).    Montel Culver, MD, [facility]   Primary Care Sports Medicine Primary Care and Sports Medicine at [facility]

## 2022-03-21 NOTE — Assessment & Plan Note (Signed)
Annual examination completed, risk stratification labs ordered, anticipatory guidance provided.  We will follow labs once resulted. 

## 2022-03-27 DIAGNOSIS — E559 Vitamin D deficiency, unspecified: Secondary | ICD-10-CM | POA: Diagnosis not present

## 2022-03-27 DIAGNOSIS — Z0000 Encounter for general adult medical examination without abnormal findings: Secondary | ICD-10-CM | POA: Diagnosis not present

## 2022-03-27 DIAGNOSIS — Z1329 Encounter for screening for other suspected endocrine disorder: Secondary | ICD-10-CM | POA: Diagnosis not present

## 2022-03-27 DIAGNOSIS — Z13220 Encounter for screening for lipoid disorders: Secondary | ICD-10-CM | POA: Diagnosis not present

## 2022-03-28 ENCOUNTER — Other Ambulatory Visit: Payer: Self-pay | Admitting: Family Medicine

## 2022-03-28 DIAGNOSIS — E559 Vitamin D deficiency, unspecified: Secondary | ICD-10-CM

## 2022-03-28 LAB — COMPREHENSIVE METABOLIC PANEL WITH GFR
ALT: 22 [IU]/L (ref 0–32)
AST: 16 [IU]/L (ref 0–40)
Albumin/Globulin Ratio: 1.9 (ref 1.2–2.2)
Albumin: 4.4 g/dL (ref 3.9–4.9)
Alkaline Phosphatase: 82 [IU]/L (ref 44–121)
BUN/Creatinine Ratio: 18 (ref 9–23)
BUN: 16 mg/dL (ref 6–20)
Bilirubin Total: 0.4 mg/dL (ref 0.0–1.2)
CO2: 21 mmol/L (ref 20–29)
Calcium: 9.8 mg/dL (ref 8.7–10.2)
Chloride: 104 mmol/L (ref 96–106)
Creatinine, Ser: 0.89 mg/dL (ref 0.57–1.00)
Globulin, Total: 2.3 g/dL (ref 1.5–4.5)
Glucose: 95 mg/dL (ref 70–99)
Potassium: 4.3 mmol/L (ref 3.5–5.2)
Sodium: 139 mmol/L (ref 134–144)
Total Protein: 6.7 g/dL (ref 6.0–8.5)
eGFR: 88 mL/min/{1.73_m2}

## 2022-03-28 LAB — LIPID PANEL
Chol/HDL Ratio: 3.8 ratio (ref 0.0–4.4)
Cholesterol, Total: 162 mg/dL (ref 100–199)
HDL: 43 mg/dL (ref >39)
LDL Chol Calc (NIH): 89 mg/dL (ref 0–99)
Triglycerides: 172 mg/dL — ABNORMAL HIGH (ref 0–149)
VLDL Cholesterol Cal: 30 mg/dL (ref 5–40)

## 2022-03-28 LAB — CBC
Hematocrit: 38.3 % (ref 34.0–46.6)
Hemoglobin: 13 g/dL (ref 11.1–15.9)
MCH: 29.7 pg (ref 26.6–33.0)
MCHC: 33.9 g/dL (ref 31.5–35.7)
MCV: 88 fL (ref 79–97)
Platelets: 252 10*3/uL (ref 150–450)
RBC: 4.37 x10E6/uL (ref 3.77–5.28)
RDW: 13.3 % (ref 11.7–15.4)
WBC: 6.6 10*3/uL (ref 3.4–10.8)

## 2022-03-28 LAB — VITAMIN D 25 HYDROXY (VIT D DEFICIENCY, FRACTURES): Vit D, 25-Hydroxy: 21.9 ng/mL — ABNORMAL LOW (ref 30.0–100.0)

## 2022-03-28 LAB — TSH: TSH: 2.45 u[IU]/mL (ref 0.450–4.500)

## 2022-03-28 MED ORDER — VITAMIN D (ERGOCALCIFEROL) 1.25 MG (50000 UNIT) PO CAPS: 50000.0000 [IU] | ORAL_CAPSULE | ORAL | 0 refills | Status: DC

## 2022-04-19 ENCOUNTER — Ambulatory Visit: Payer: Medicaid Other | Admitting: Family Medicine

## 2022-04-19 ENCOUNTER — Encounter: Payer: Self-pay | Admitting: Family Medicine

## 2022-04-19 VITALS — BP 120/70 | HR 88 | Ht 59.0 in | Wt 224.0 lb

## 2022-04-19 DIAGNOSIS — J329 Chronic sinusitis, unspecified: Secondary | ICD-10-CM | POA: Diagnosis not present

## 2022-04-19 DIAGNOSIS — Z7689 Persons encountering health services in other specified circumstances: Secondary | ICD-10-CM

## 2022-04-19 MED ORDER — PHENTERMINE HCL 15 MG PO CAPS: 15.0000 mg | ORAL_CAPSULE | ORAL | 0 refills | Status: DC

## 2022-04-19 MED ORDER — TOPIRAMATE 25 MG/ML PO SOLN: 25.0000 mg | ORAL | 0 refills | Status: DC

## 2022-04-19 MED ORDER — METFORMIN HCL 500 MG PO TABS: 500.0000 mg | ORAL_TABLET | Freq: Every day | ORAL | 0 refills | Status: DC

## 2022-04-19 NOTE — Progress Notes (Signed)
     Primary Care / Sports Medicine Office Visit  Patient Information:  Patient ID: Denise Walker, female DOB: Jan 09, 1991 Age: 32 y.o. MRN: [id number]   [name] is a pleasant 32 y.o. female presenting with the following:  No chief complaint on file.   Vitals:   04/19/22 1556  BP: 120/70  Pulse: 88  SpO2: 98%   Vitals:   04/19/22 1556  Weight: 224 lb (101.6 kg)  Height: 4\' 11"  (1.499 m)   Body mass index is 45.24 kg/m.  No results found.   Independent interpretation of notes and tests performed by another provider:   None  Procedures performed:   None  Pertinent History, Exam, Impression, and Recommendations:   Encounter for weight management Assessment & Plan: We reviewed current treatment options, patient has been having difficulties losing weight despite attempts at diet and exercise control.  Will plan on initiation of low-dose phentermine, topiramate, metformin, adjunct lifestyle modifications, advised on monitoring for adverse effects.  Follow-up in 1 month for reevaluation, can further titrate medications if symptoms allow.  Orders: -     Phentermine HCl; Take 1 capsule (15 mg total) by mouth every morning.  Dispense: 30 capsule; Refill: 0 -     Topiramate; Take 1 mL (25 mg total) by mouth daily.  Dispense: 30 mL; Refill: 0 -     metFORMIN HCl; Take 1 tablet (500 mg total) by mouth daily with breakfast.  Dispense: 30 tablet; Refill: 0  Recurrent sinusitis Assessment & Plan: Chronic issue, has previously established with ENT roughly 10 years prior, noting recurrent symptomatology and is requesting reevaluation by local ENT.  Referral placed today.  We will follow peripherally on this issue.  Orders: -     Ambulatory referral to ENT     Orders & Medications Meds ordered this encounter  Medications   phentermine 15 MG capsule    Sig: Take 1 capsule (15 mg total) by mouth every morning.    Dispense:  30 capsule    Refill:  0   topiramate  (EPRONTIA) 25 MG/ML SOLN ORAL solution    Sig: Take 1 mL (25 mg total) by mouth daily.    Dispense:  30 mL    Refill:  0    Tablet or capsule alternative OK   metFORMIN (GLUCOPHAGE) 500 MG tablet    Sig: Take 1 tablet (500 mg total) by mouth daily with breakfast.    Dispense:  30 tablet    Refill:  0   Orders Placed This Encounter  Procedures   Ambulatory referral to ENT     Return in about 4 weeks (around 05/17/2022) for weight mgmt.     Montel Culver, MD, [facility]   Primary Care Sports Medicine Primary Care and Sports Medicine at [facility]

## 2022-04-19 NOTE — Assessment & Plan Note (Signed)
Chronic issue, has previously established with ENT roughly 10 years prior, noting recurrent symptomatology and is requesting reevaluation by local ENT.  Referral placed today.  We will follow peripherally on this issue.

## 2022-04-19 NOTE — Assessment & Plan Note (Signed)
We reviewed current treatment options, patient has been having difficulties losing weight despite attempts at diet and exercise control.  Will plan on initiation of low-dose phentermine, topiramate, metformin, adjunct lifestyle modifications, advised on monitoring for adverse effects.  Follow-up in 1 month for reevaluation, can further titrate medications if symptoms allow.

## 2022-04-19 NOTE — Patient Instructions (Addendum)
-   Start phentermine daily on empty stomach - Start topiramate and metformin daily with food - Reviewed information attached regarding healthy lifestyle changes - Referral coordinator will contact you in regards to scheduling visit with ENT - Return for follow-up in 1 month

## 2022-04-23 ENCOUNTER — Other Ambulatory Visit: Payer: Self-pay | Admitting: Family Medicine

## 2022-04-23 NOTE — Telephone Encounter (Signed)
Requested medication (s) are due for refill today - provider review   Requested medication (s) are on the active medication list -yes  Future visit scheduled -yes  Last refill: 02/06/22 #45  Notes to clinic: Rx for acute problem/visit- sent for review to continue   Requested Prescriptions  Pending Prescriptions Disp Refills   meloxicam (MOBIC) 15 MG tablet [Pharmacy Med Name: MELOXICAM 15 MG TABLET] 45 tablet 0    Sig: Take 1 tablet (15 mg total) by mouth daily.     Analgesics:  COX2 Inhibitors Failed - 04/23/2022 11:49 AM      Failed - Manual Review: Labs are only required if the patient has taken medication for more than 8 weeks.      Passed - HGB in normal range and within 360 days    Hemoglobin  Date Value Ref Range Status  03/27/2022 13.0 11.1 - 15.9 g/dL Final         Passed - Cr in normal range and within 360 days    Creatinine  Date Value Ref Range Status  10/11/2013 0.83 0.60 - 1.30 mg/dL Final   Creatinine, Ser  Date Value Ref Range Status  03/27/2022 0.89 0.57 - 1.00 mg/dL Final   Creatinine, Urine  Date Value Ref Range Status  12/18/2017 121 mg/dL Final         Passed - HCT in normal range and within 360 days    Hematocrit  Date Value Ref Range Status  03/27/2022 38.3 34.0 - 46.6 % Final         Passed - AST in normal range and within 360 days    AST  Date Value Ref Range Status  03/27/2022 16 0 - 40 IU/L Final   SGOT(AST)  Date Value Ref Range Status  10/11/2013 15 15 - 37 Unit/L Final         Passed - ALT in normal range and within 360 days    ALT  Date Value Ref Range Status  03/27/2022 22 0 - 32 IU/L Final   SGPT (ALT)  Date Value Ref Range Status  10/11/2013 24 U/L Final    Comment:    14-63 NOTE: New Reference Range 08/31/13          Passed - eGFR is 30 or above and within 360 days    EGFR (African American)  Date Value Ref Range Status  10/11/2013 >60  Final   GFR calc Af Amer  Date Value Ref Range Status  04/06/2020 83  >59 mL/min/1.73 Final    Comment:    **In accordance with recommendations from the NKF-ASN Task force,**   [facility] is in the process of updating its eGFR calculation to the   2021 CKD-EPI creatinine equation that estimates kidney function   without a race variable.    EGFR (Non-African Amer.)  Date Value Ref Range Status  10/11/2013 >60  Final    Comment:    eGFR values <4m/min/1.73 m2 may be an indication of chronic kidney disease (CKD). Calculated eGFR is useful in patients with stable renal function. The eGFR calculation will not be reliable in acutely ill patients when serum creatinine is changing rapidly. It is not useful in  patients on dialysis. The eGFR calculation may not be applicable to patients at the low and high extremes of body sizes, pregnant women, and vegetarians.    GFR calc non Af Amer  Date Value Ref Range Status  04/06/2020 72 >59 mL/min/1.73 Final   eGFR  Date Value  Ref Range Status  03/27/2022 88 >59 mL/min/1.73 Final         Passed - Patient is not pregnant      Passed - Valid encounter within last 12 months    Recent Outpatient Visits           4 days ago Encounter for weight management   [place] [facility] at [place], Earley Abide, MD   1 month ago Annual physical exam   [facility] at [place], Earley Abide, MD   2 months ago Strain of gastrocnemius muscle of left lower extremity, initial encounter   [place] at [place], Earley Abide, MD   10 months ago Mixed hyperlipidemia   [facility] at [facility], Earley Abide, MD   11 months ago PCOS (polycystic ovarian syndrome)   [place] [facility] [place] at [place], MD       Future Appointments             In 1 month Zigmund Daniel Earley Abide, MD [place] at  [facility], [facility]               Requested Prescriptions  Pending Prescriptions Disp Refills   meloxicam (MOBIC) 15 MG tablet [Pharmacy Med Name: MELOXICAM 15 MG TABLET] 45 tablet 0    Sig: Take 1 tablet (15 mg total) by mouth daily.     Analgesics:  COX2 Inhibitors Failed - 04/23/2022 11:49 AM      Failed - Manual Review: Labs are only required if the patient has taken medication for more than 8 weeks.      Passed - HGB in normal range and within 360 days    Hemoglobin  Date Value Ref Range Status  03/27/2022 13.0 11.1 - 15.9 g/dL Final         Passed - Cr in normal range and within 360 days    Creatinine  Date Value Ref Range Status  10/11/2013 0.83 0.60 - 1.30 mg/dL Final   Creatinine, Ser  Date Value Ref Range Status  03/27/2022 0.89 0.57 - 1.00 mg/dL Final   Creatinine, Urine  Date Value Ref Range Status  12/18/2017 121 mg/dL Final         Passed - HCT in normal range and within 360 days    Hematocrit  Date Value Ref Range Status  03/27/2022 38.3 34.0 - 46.6 % Final         Passed - AST in normal range and within 360 days    AST  Date Value Ref Range Status  03/27/2022 16 0 - 40 IU/L Final   SGOT(AST)  Date Value Ref Range Status  10/11/2013 15 15 - 37 Unit/L Final         Passed - ALT in normal range and within 360 days    ALT  Date Value Ref Range Status  03/27/2022 22 0 - 32 IU/L Final   SGPT (ALT)  Date Value Ref Range Status  10/11/2013 24 U/L Final    Comment:    14-63 NOTE: New Reference Range 08/31/13          Passed - eGFR is 30 or above and within 360 days    EGFR (African American)  Date Value Ref Range Status  10/11/2013 >60  Final   GFR  calc Af Amer  Date Value Ref Range Status  04/06/2020 83 >59 mL/min/1.73 Final    Comment:    **In accordance with recommendations from the NKF-ASN Task force,**   [facility] is in the process of updating its eGFR calculation to the   2021 CKD-EPI creatinine equation that estimates  kidney function   without a race variable.    EGFR (Non-African Amer.)  Date Value Ref Range Status  10/11/2013 >60  Final    Comment:    eGFR values <82m/min/1.73 m2 may be an indication of chronic kidney disease (CKD). Calculated eGFR is useful in patients with stable renal function. The eGFR calculation will not be reliable in acutely ill patients when serum creatinine is changing rapidly. It is not useful in  patients on dialysis. The eGFR calculation may not be applicable to patients at the low and high extremes of body sizes, pregnant women, and vegetarians.    GFR calc non Af Amer  Date Value Ref Range Status  04/06/2020 72 >59 mL/min/1.73 Final   eGFR  Date Value Ref Range Status  03/27/2022 88 >59 mL/min/1.73 Final         Passed - Patient is not pregnant      Passed - Valid encounter within last 12 months    Recent Outpatient Visits           4 days ago Encounter for weight management   [place] [facility] [place]   1 month ago Annual physical exam   [place] [place] [facility] [place]   2 months ago Strain of gastrocnemius muscle of left lower extremity, initial encounter   [place]   10 months ago Mixed hyperlipidemia   [facility]   11 months ago PCOS (polycystic ovarian syndrome)   [place] [facility]       Future Appointments             In 1 month MZigmund Daniel [place] Abide [facility] [place] [facility]

## 2022-05-01 ENCOUNTER — Encounter: Payer: Self-pay | Admitting: Family Medicine

## 2022-05-02 NOTE — Telephone Encounter (Signed)
Please review.  KP

## 2022-05-03 ENCOUNTER — Telehealth: Payer: Self-pay | Admitting: Family Medicine

## 2022-05-03 IMAGING — MR MR LUMBAR SPINE W/O CM
5 series · 31 of 48 positions shown · non-contrast
Comparison: None.

CLINICAL DATA: Low back pain and right leg weakness over the last
1.5 years.

EXAM:
MRI LUMBAR SPINE WITHOUT CONTRAST
TECHNIQUE: Multiplanar, multisequence MR imaging of the lumbar spine was
performed. No intravenous contrast was administered.

[Series 5: T2 · sagittal · 4.0mm · 0.81mm/px · 6 of 15 slices shown (1 of 2)]
[im 1/15]
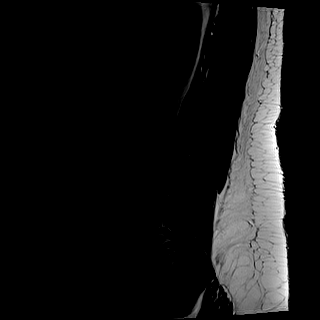
[im 3/15]
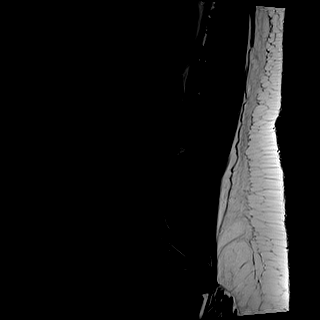
[im 6/15]
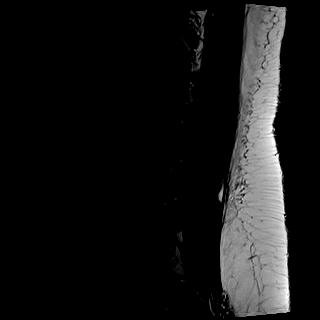
[im 9/15]
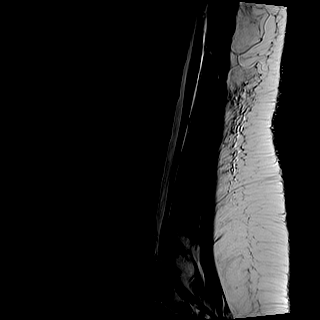
[im 12/15]
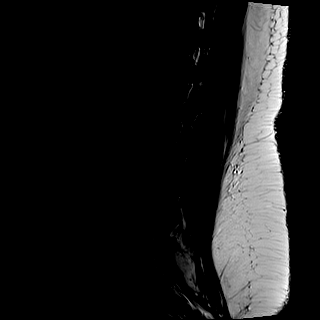
[im 15/15]
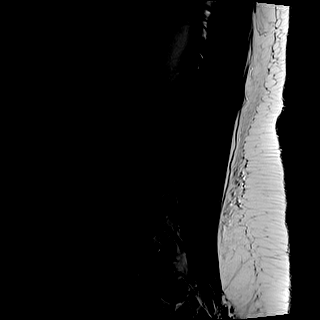

[Series 6: T1 · sagittal · 4.0mm · 0.81mm/px · 7 of 15 slices shown (1 of 2)]
[im 1/15]
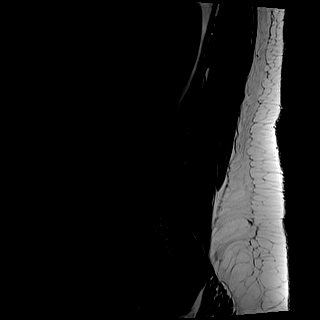
[im 3/15]
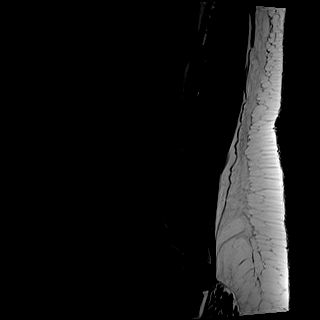
[im 5/15]
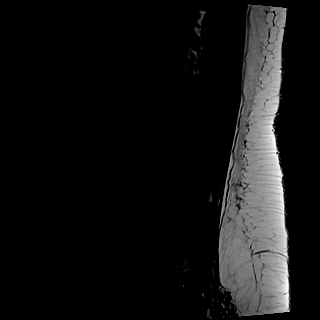
[im 8/15]
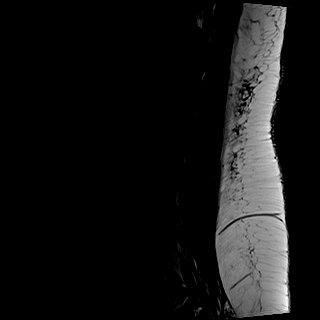
[im 10/15]
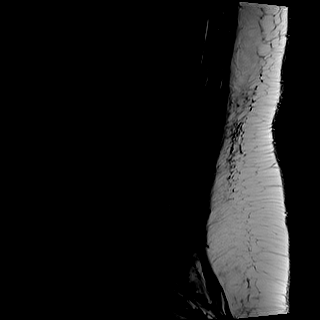
[im 12/15]
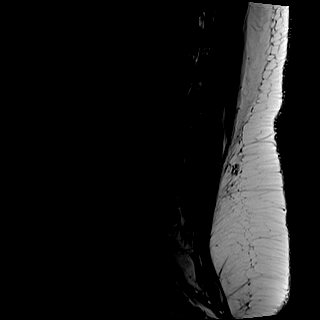
[im 15/15]
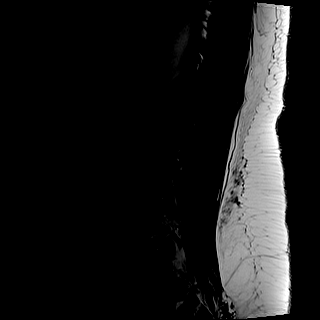

[Series 7: STIR · sagittal · 4.0mm · 0.41mm/px · 2 of 15 slices shown]
[im 1/15]
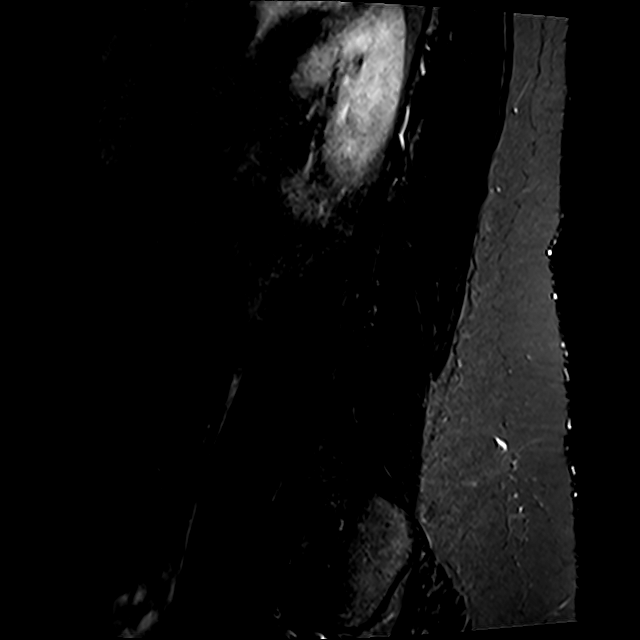
[im 3/15]
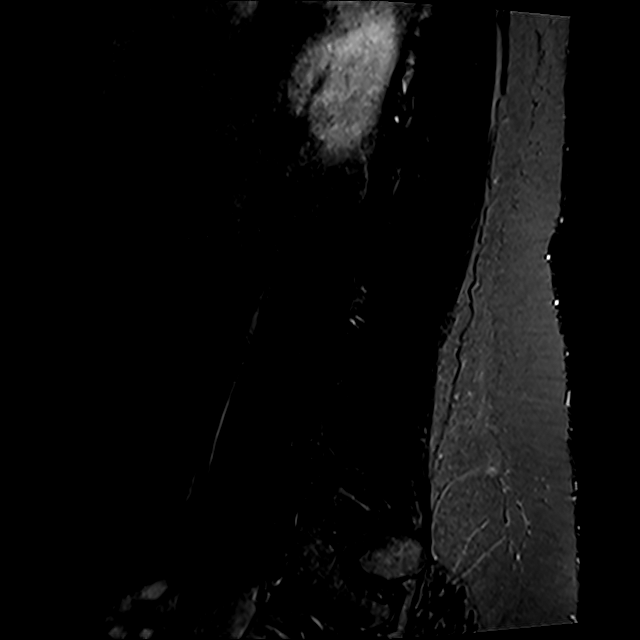

[Series 8: T2 · axial · 4.0mm · 0.78mm/px · z∈[-99,+97]mm · 8 of 32 slices shown (2 of 2)]
[im 1/32]
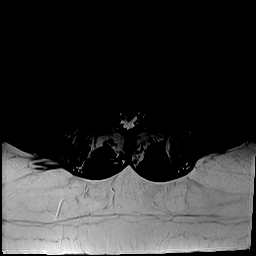
[im 5/32]
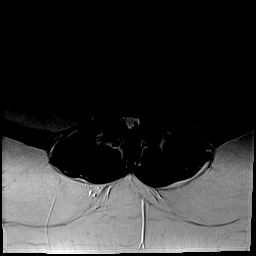
[im 10/32]
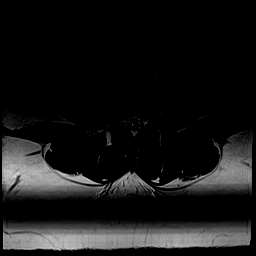
[im 15/32]
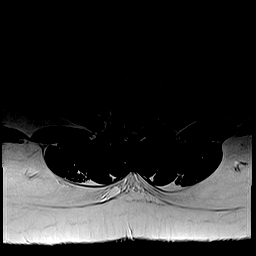
[im 17/32]
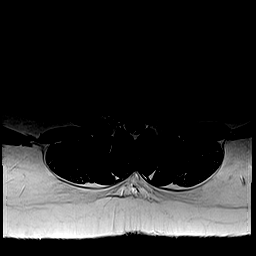
[im 22/32]
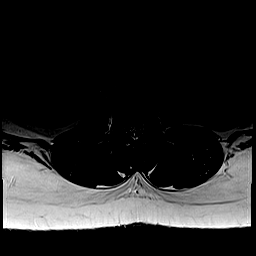
[im 27/32]
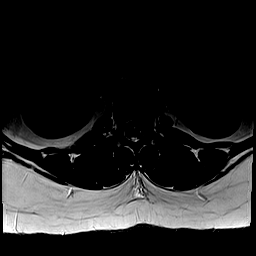
[im 32/32]
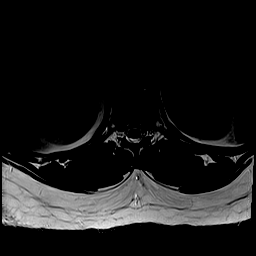

[Series 9: T1 · axial · 4.0mm · 0.39mm/px · z∈[-99,+97]mm · 8 of 32 slices shown (2 of 2)]
[im 1/32]
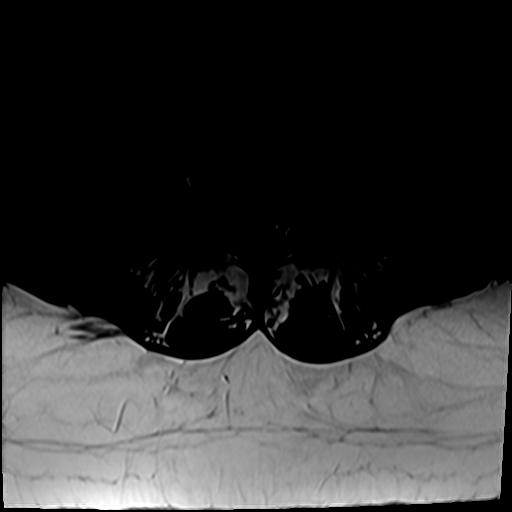
[im 5/32]
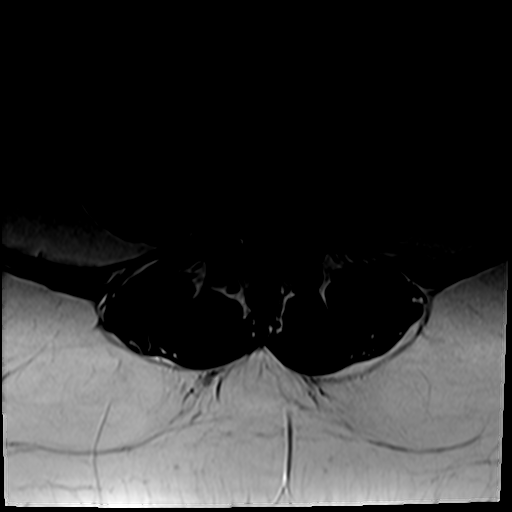
[im 10/32]
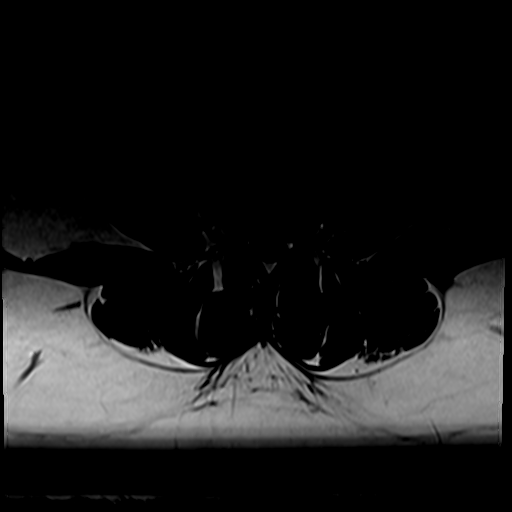
[im 15/32]
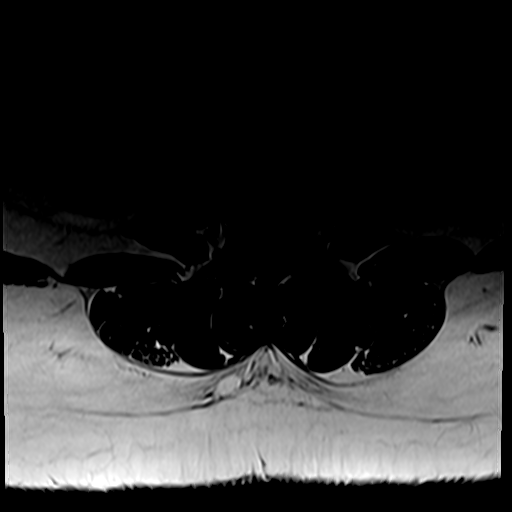
[im 17/32]
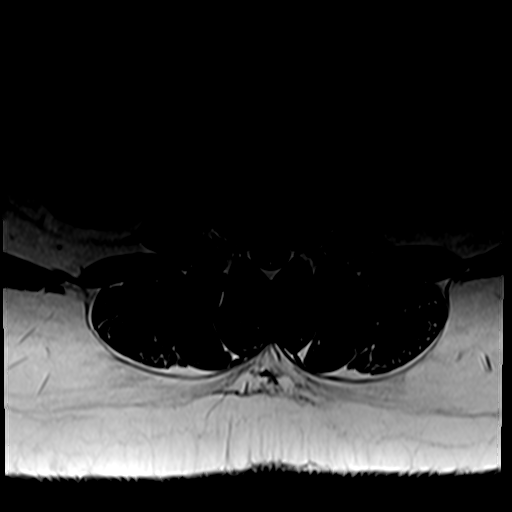
[im 22/32]
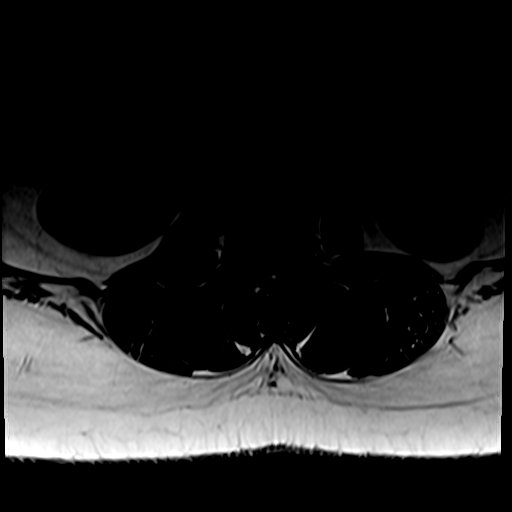
[im 27/32]
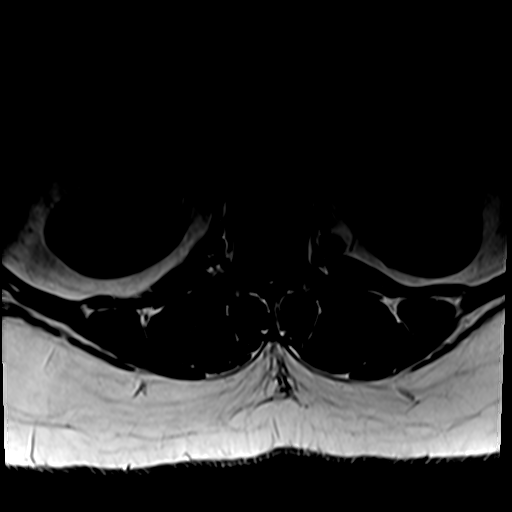
[im 32/32]
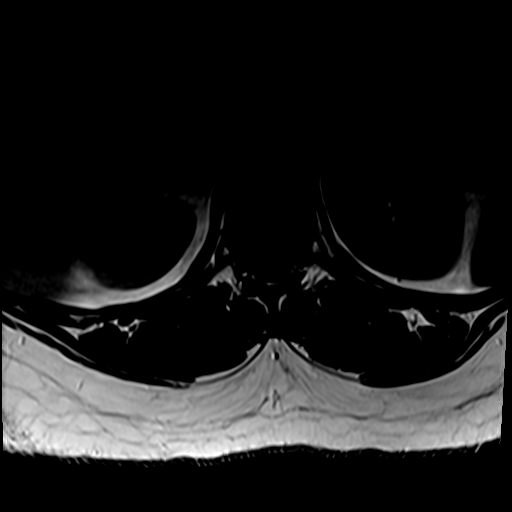

[31 of 48 positions shown; findings below may reference images not displayed]

FINDINGS: Segmentation:  5 lumbar type vertebral bodies.

Alignment:  Normal

Vertebrae:  Normal

Conus medullaris and cauda equina: Conus extends to the L1-2 level.
Conus and cauda equina appear normal.

Paraspinal and other soft tissues: Normal

Disc levels:

No abnormality at L3-4 or above.

At L4-5, there is mild degenerative desiccation of the disc and mild
bulging in the left foraminal region. This contacts the left L4
nerve and could irritate that structure. However, I note that the
patient's symptoms are on the other side.

L5-S1 disc level is normal.
IMPRESSION: No cause of right leg weakness is identified. The L4-5 disc shows
mild desiccation and mild left foraminal bulging. There is contact
with the exiting left L4 nerve and that structure could be
irritated. However, this appears to be on the opposite side of the
patient's symptoms and may be incidental and insignificant.

## 2022-05-03 NOTE — Telephone Encounter (Signed)
Copied from [place] [id number]. Topic: General - Other >> May 03, 2022  3:36 PM Jacinto Reap M wrote: Reason for CRM: Pt called to follow up on the [facility] message that she sent regarding medication for the nausea. Pt requests call back at [phone]

## 2022-05-13 ENCOUNTER — Other Ambulatory Visit: Payer: Self-pay | Admitting: Family Medicine

## 2022-05-13 DIAGNOSIS — Z7689 Persons encountering health services in other specified circumstances: Secondary | ICD-10-CM

## 2022-05-14 NOTE — Telephone Encounter (Signed)
Requested medication (s) are due for refill today: yes  Requested medication (s) are on the active medication list: yes  Last refill:  3//24  Future visit scheduled: yes  Notes to clinic:  : Sharon. DX Code Needed.      Requested Prescriptions  Pending Prescriptions Disp Refills   metFORMIN (GLUCOPHAGE) 500 MG tablet [Pharmacy Med Name: METFORMIN HCL 500 MG TABLET] 90 tablet 1    Sig: TAKE 1 TABLET BY MOUTH EVERY DAY WITH BREAKFAST     Endocrinology:  Diabetes - Biguanides Failed - 05/13/2022  2:31 PM      Failed - HBA1C is between 0 and 7.9 and within 180 days    Hgb A1c MFr Bld  Date Value Ref Range Status  06/08/2020 5.5 4.8 - 5.6 % Final    Comment:             Prediabetes: 5.7 - 6.4          Diabetes: >6.4          Glycemic control for adults with diabetes: <7.0          Failed - B12 Level in normal range and within 720 days    Vitamin B-12  Date Value Ref Range Status  09/22/2019 587 232 - 1,245 pg/mL Final         Failed - CBC within normal limits and completed in the last 12 months    WBC  Date Value Ref Range Status  03/27/2022 6.6 3.4 - 10.8 x10E3/uL Final  01/02/2019 12.5 (H) 4.0 - 10.5 K/uL Final   RBC  Date Value Ref Range Status  03/27/2022 4.37 3.77 - 5.28 x10E6/uL Final  01/02/2019 3.23 (L) 3.87 - 5.11 MIL/uL Final   Hemoglobin  Date Value Ref Range Status  03/27/2022 13.0 11.1 - 15.9 g/dL Final   Hematocrit  Date Value Ref Range Status  03/27/2022 38.3 34.0 - 46.6 % Final   MCHC  Date Value Ref Range Status  03/27/2022 33.9 31.5 - 35.7 g/dL Final  01/02/2019 32.0 30.0 - 36.0 g/dL Final   [facility]  Date Value Ref Range Status  03/27/2022 29.7 26.6 - 33.0 pg Final  01/02/2019 26.6 26.0 - 34.0 pg Final   MCV  Date Value Ref Range Status  03/27/2022 88 79 - 97 fL Final  10/11/2013 90 80 - 100 fL Final   No results found for: "PLTCOUNTKUC", "LABPLAT", "POCPLA" RDW  Date Value Ref Range Status  03/27/2022 13.3 11.7  - 15.4 % Final  10/11/2013 13.3 11.5 - 14.5 % Final         Passed - Cr in normal range and within 360 days    Creatinine  Date Value Ref Range Status  10/11/2013 0.83 0.60 - 1.30 mg/dL Final   Creatinine, Ser  Date Value Ref Range Status  03/27/2022 0.89 0.57 - 1.00 mg/dL Final   Creatinine, Urine  Date Value Ref Range Status  12/18/2017 121 mg/dL Final         Passed - eGFR in normal range and within 360 days    EGFR (African American)  Date Value Ref Range Status  10/11/2013 >60  Final   GFR calc Af Amer  Date Value Ref Range Status  04/06/2020 83 >59 mL/min/1.73 Final    Comment:    **In accordance with recommendations from the NKF-ASN Task force,**   [facility] is in the process of updating its eGFR calculation to the   2021 CKD-EPI creatinine equation  that estimates kidney function   without a race variable.    EGFR (Non-African Amer.)  Date Value Ref Range Status  10/11/2013 >60  Final    Comment:    eGFR values <12mL/min/1.73 m2 may be an indication of chronic kidney disease (CKD). Calculated eGFR is useful in patients with stable renal function. The eGFR calculation will not be reliable in acutely ill patients when serum creatinine is changing rapidly. It is not useful in  patients on dialysis. The eGFR calculation may not be applicable to patients at the low and high extremes of body sizes, pregnant women, and vegetarians.    GFR calc non Af Amer  Date Value Ref Range Status  04/06/2020 72 >59 mL/min/1.73 Final   eGFR  Date Value Ref Range Status  03/27/2022 88 >59 mL/min/1.73 Final         Passed - Valid encounter within last 6 months    Recent Outpatient Visits           3 weeks ago Encounter for weight management   [place] [facility] at [place], Earley Abide, MD   1 month ago Annual physical exam   [place] at [place], Earley Abide, MD   3 months ago Strain of  gastrocnemius muscle of left lower extremity, initial encounter   [place] at [place], Earley Abide, MD   11 months ago Mixed hyperlipidemia   [facility] [place] [facility] [place], Earley Abide, MD   1 year ago PCOS (polycystic ovarian syndrome)   [place] [facility] [place] at [facility], Earley Abide, MD       Future Appointments             In 1 week Zigmund Daniel, Earley Abide, MD [place] at [facility], [facility]

## 2022-05-23 ENCOUNTER — Ambulatory Visit: Payer: Medicaid Other | Admitting: Family Medicine

## 2022-05-30 ENCOUNTER — Ambulatory Visit: Payer: Medicaid Other | Admitting: Family Medicine

## 2022-05-30 ENCOUNTER — Encounter: Payer: Self-pay | Admitting: Family Medicine

## 2022-05-30 DIAGNOSIS — Z7689 Persons encountering health services in other specified circumstances: Secondary | ICD-10-CM | POA: Diagnosis not present

## 2022-05-30 DIAGNOSIS — K219 Gastro-esophageal reflux disease without esophagitis: Secondary | ICD-10-CM

## 2022-05-30 MED ORDER — MELOXICAM 7.5 MG PO TABS: 7.5000 mg | ORAL_TABLET | Freq: Every day | ORAL | 0 refills | Status: DC | PRN

## 2022-05-30 MED ORDER — TOPIRAMATE 25 MG PO TABS
25.0000 mg | ORAL_TABLET | Freq: Every day | ORAL | 2 refills | Status: DC
Start: 2022-05-30 — End: 2022-08-29

## 2022-05-30 MED ORDER — OMEPRAZOLE 40 MG PO CPDR: 40.0000 mg | DELAYED_RELEASE_CAPSULE | Freq: Every day | ORAL | 0 refills | Status: DC

## 2022-05-30 MED ORDER — PHENTERMINE HCL 30 MG PO CAPS
30.0000 mg | ORAL_CAPSULE | ORAL | 0 refills | Status: DC
Start: 2022-05-30 — End: 2022-07-01

## 2022-06-02 DIAGNOSIS — K219 Gastro-esophageal reflux disease without esophagitis: Secondary | ICD-10-CM | POA: Insufficient documentation

## 2022-06-02 NOTE — Assessment & Plan Note (Signed)
Demonstrating tolerance of phentermine but with nausea on topiramate.  - Maintain current topiramate / transition to evening dosing - Titrate phentermine and monitor for any change in nausea as this may be cause - GERD Ppx - Return as scheduled

## 2022-06-02 NOTE — Progress Notes (Signed)
     Primary Care / Sports Medicine Office Visit  Patient Information:  Patient ID: Denise Walker, female DOB: 05/04/1990 Age: 32 y.o. MRN: [id number]   Denise Walker is a pleasant 32 y.o. female presenting with the following:  Chief Complaint  Patient presents with   Weight Check    Very nauseated on topiramte     Vitals:   05/30/22 1606 05/30/22 1608  BP: (!) 122/100 112/82  Pulse: 100   SpO2: 96%    Vitals:   05/30/22 1606  Weight: 220 lb (99.8 kg)  Height:  (1.499 m)   Body mass index is 44.43 kg/m.  No results found.   Independent interpretation of notes and tests performed by another provider:   None  Procedures performed:   None  Pertinent History, Exam, Impression, and Recommendations:   Denise Walker was seen today for weight check.  Encounter for weight management Assessment & Plan: Demonstrating tolerance of phentermine but with nausea on topiramate.  - Maintain current topiramate / transition to evening dosing - Titrate phentermine and monitor for any change in nausea as this may be cause - GERD Ppx - Return as scheduled  Orders: -     Phentermine HCl; Take 1 capsule (30 mg total) by mouth every morning.  Dispense: 30 capsule; Refill: 0 -     Topiramate; Take 1 tablet (25 mg total) by mouth at bedtime.  Dispense: 30 tablet; Refill: 2  Gastroesophageal reflux disease without esophagitis -     Omeprazole; Take 1 capsule (40 mg total) by mouth daily.  Dispense: 90 capsule; Refill: 0  Other orders -     Meloxicam; Take 1-2 tablets (7.5-15 mg total) by mouth daily as needed for pain.  Dispense: 60 tablet; Refill: 0     Orders & Medications Meds ordered this encounter  Medications   phentermine 30 MG capsule    Sig: Take 1 capsule (30 mg total) by mouth every morning.    Dispense:  30 capsule    Refill:  0   topiramate (TOPAMAX) 25 MG tablet    Sig: Take 1 tablet (25 mg total) by mouth at bedtime.    Dispense:  30 tablet    Refill:  2    omeprazole (PRILOSEC) 40 MG capsule    Sig: Take 1 capsule (40 mg total) by mouth daily.    Dispense:  90 capsule    Refill:  0   meloxicam (MOBIC) 7.5 MG tablet    Sig: Take 1-2 tablets (7.5-15 mg total) by mouth daily as needed for pain.    Dispense:  60 tablet    Refill:  0   No orders of the defined types were placed in this encounter.    No follow-ups on file.     Jerrol Banana, MD, [facility]   Primary Care Sports Medicine Primary Care and Sports Medicine at [facility]

## 2022-06-03 ENCOUNTER — Encounter: Payer: Medicaid Other | Attending: Psychology | Admitting: Psychology

## 2022-06-21 ENCOUNTER — Other Ambulatory Visit: Payer: Self-pay | Admitting: Family Medicine

## 2022-06-21 DIAGNOSIS — E559 Vitamin D deficiency, unspecified: Secondary | ICD-10-CM

## 2022-06-21 NOTE — Telephone Encounter (Signed)
Requested medication (s) are due for refill today - yes  Requested medication (s) are on the active medication list -yes  Future visit scheduled -yes  Last refill: 03/28/22 #8  Notes to clinic: provider review- high does medication   Requested Prescriptions  Pending Prescriptions Disp Refills   Vitamin D, Ergocalciferol, (DRISDOL) 1.25 MG (50000 UNIT) CAPS capsule [Pharmacy Med Name: VITAMIN D2 1.25MG (50,000 UNIT)] 8 capsule 0    Sig: TAKE 1 CAPSULE BY MOUTH EVERY 7 DAYS     Endocrinology:  Vitamins - Vitamin D Supplementation 2 Failed - 06/21/2022 11:32 AM      Failed - Manual Review: Route requests for 50,000 IU strength to the provider      Failed - Vitamin D in normal range and within 360 days    Vit D, 25-Hydroxy  Date Value Ref Range Status  03/27/2022 21.9 (L) 30.0 - 100.0 ng/mL Final    Comment:    Vitamin D deficiency has been defined by the Institute of Medicine and an Endocrine Society practice guideline as a level of serum 25-OH vitamin D less than 20 ng/mL (1,2). The Endocrine Society went on to further define vitamin D insufficiency as a level between 21 and 29 ng/mL (2). 1. IOM (Institute of Medicine). 2010. Dietary reference    intakes for calcium and D. [place]: The    [employer]. 2. Holick MF, Binkley [place], Bischoff-Ferrari HA, et al.    Evaluation, treatment, and prevention of vitamin D    deficiency: an Endocrine Society clinical practice    guideline. JCEM. 2011 Jul; 96(7):1911-30.          Passed - Ca in normal range and within 360 days    Calcium  Date Value Ref Range Status  03/27/2022 9.8 8.7 - 10.2 mg/dL Final   Calcium, Total  Date Value Ref Range Status  10/11/2013 9.0 8.5 - 10.1 mg/dL Final         Passed - Valid encounter within last 12 months    Recent Outpatient Visits           3 weeks ago Encounter for weight management   [place] [facility], Ocie Bob, MD   2  months ago Encounter for weight management   [place] [facility], Ocie Bob, MD   3 months ago Annual physical exam   [facility] at [facility], Ocie Bob, MD   4 months ago Strain of gastrocnemius muscle of left lower extremity, initial encounter   [facility], Ocie Bob, MD   1 year ago Mixed hyperlipidemia   [place] [facility], Ocie Bob, MD       Future Appointments             In 2 months Ashley Royalty, Ocie Bob, MD [facility] Primary Care [facility] Sports Medicine at [facility], [facility]               Requested Prescriptions  Pending Prescriptions Disp Refills   Vitamin D, Ergocalciferol, (DRISDOL) 1.25 MG (50000 UNIT) CAPS capsule [Pharmacy Med Name: VITAMIN D2 1.25MG (50,000 UNIT)] 8 capsule 0    Sig: TAKE 1 CAPSULE BY MOUTH EVERY 7 DAYS     Endocrinology:  Vitamins - Vitamin D Supplementation 2 Failed - 06/21/2022 11:32 AM  Failed - Manual Review: Route requests for 50,000 IU strength to the provider      Failed - Vitamin D in normal range and within 360 days    Vit D, 25-Hydroxy  Date Value Ref Range Status  03/27/2022 21.9 (L) 30.0 - 100.0 ng/mL Final    Comment:    Vitamin D deficiency has been defined by the Institute of Medicine and an Endocrine Society practice guideline as a level of serum 25-OH vitamin D less than 20 ng/mL (1,2). The Endocrine Society went on to further define vitamin D insufficiency as a level between 21 and 29 ng/mL (2). 1. IOM (Institute of Medicine). 2010. Dietary reference    intakes for calcium and D. [place]: The    [employer]. 2. Holick MF, Binkley [place], Bischoff-Ferrari HA, et al.    Evaluation, treatment, and prevention of vitamin D    deficiency: an Endocrine Society clinical practice    guideline. JCEM. 2011 Jul;  96(7):1911-30.          Passed - Ca in normal range and within 360 days    Calcium  Date Value Ref Range Status  03/27/2022 9.8 8.7 - 10.2 mg/dL Final   Calcium, Total  Date Value Ref Range Status  10/11/2013 9.0 8.5 - 10.1 mg/dL Final         Passed - Valid encounter within last 12 months    Recent Outpatient Visits           3 weeks ago Encounter for weight management   [place] [facility], Ocie Bob, MD   2 months ago Encounter for weight management   [place] [facility], Ocie Bob, MD   3 months ago Annual physical exam   [facility] at [facility], Ocie Bob, MD   4 months ago Strain of gastrocnemius muscle of left lower extremity, initial encounter   [facility] at [facility], Ocie Bob, MD   1 year ago Mixed hyperlipidemia   [facility] at [facility], Ocie Bob, MD       Future Appointments             In 2 months Ashley Royalty, Ocie Bob, MD [facility] Primary Care & Sports Medicine at [facility], [facility]

## 2022-07-01 ENCOUNTER — Other Ambulatory Visit: Payer: Self-pay | Admitting: Family Medicine

## 2022-07-01 ENCOUNTER — Encounter: Payer: Self-pay | Admitting: Family Medicine

## 2022-07-01 DIAGNOSIS — Z7689 Persons encountering health services in other specified circumstances: Secondary | ICD-10-CM

## 2022-07-01 MED ORDER — PHENTERMINE HCL 30 MG PO CAPS
30.0000 mg | ORAL_CAPSULE | ORAL | 0 refills | Status: DC
Start: 2022-07-01 — End: 2022-07-30

## 2022-07-01 NOTE — Telephone Encounter (Signed)
Please review.  KP

## 2022-07-23 ENCOUNTER — Encounter (INDEPENDENT_AMBULATORY_CARE_PROVIDER_SITE_OTHER): Payer: Medicaid Other | Admitting: Family Medicine

## 2022-07-23 DIAGNOSIS — L03119 Cellulitis of unspecified part of limb: Secondary | ICD-10-CM | POA: Diagnosis not present

## 2022-07-24 MED ORDER — CEPHALEXIN 500 MG PO CAPS
500.0000 mg | ORAL_CAPSULE | Freq: Two times a day (BID) | ORAL | 0 refills | Status: DC
Start: 2022-07-24 — End: 2022-10-09

## 2022-07-24 NOTE — Telephone Encounter (Signed)
Please advise 

## 2022-07-24 NOTE — Telephone Encounter (Signed)
Please see the MyChart message reply(ies) for my assessment and plan.    This patient gave consent for this Medical Advice Message and is aware that it may result in a bill to [employer], as well as the possibility of receiving a bill for a co-payment or deductible. They are an established patient, but are not seeking medical advice exclusively about a problem treated during an in person or video visit in the last seven days. I did not recommend an in person or video visit within seven days of my reply.    I spent a total of 14 minutes cumulative time within 7 days through [employer].  Jerrol Banana, MD

## 2022-07-29 ENCOUNTER — Telehealth: Payer: Self-pay

## 2022-07-29 ENCOUNTER — Encounter: Payer: Self-pay | Admitting: Family Medicine

## 2022-07-29 NOTE — Telephone Encounter (Signed)
Please advise 

## 2022-07-30 ENCOUNTER — Other Ambulatory Visit: Payer: Self-pay | Admitting: Family Medicine

## 2022-07-30 DIAGNOSIS — Z7689 Persons encountering health services in other specified circumstances: Secondary | ICD-10-CM

## 2022-07-30 MED ORDER — PHENTERMINE HCL 30 MG PO CAPS
30.0000 mg | ORAL_CAPSULE | ORAL | 0 refills | Status: DC
Start: 2022-07-30 — End: 2022-08-29

## 2022-07-30 NOTE — Telephone Encounter (Signed)
error 

## 2022-07-30 NOTE — Telephone Encounter (Signed)
fyi

## 2022-08-29 ENCOUNTER — Encounter: Payer: Self-pay | Admitting: Family Medicine

## 2022-08-29 ENCOUNTER — Ambulatory Visit: Payer: Medicaid Other | Admitting: Family Medicine

## 2022-08-29 VITALS — BP 128/78 | HR 82 | Ht 59.0 in | Wt 220.0 lb

## 2022-08-29 DIAGNOSIS — F419 Anxiety disorder, unspecified: Secondary | ICD-10-CM | POA: Diagnosis not present

## 2022-08-29 DIAGNOSIS — Z7689 Persons encountering health services in other specified circumstances: Secondary | ICD-10-CM | POA: Diagnosis not present

## 2022-08-29 DIAGNOSIS — G478 Other sleep disorders: Secondary | ICD-10-CM | POA: Diagnosis not present

## 2022-08-29 DIAGNOSIS — F32A Depression, unspecified: Secondary | ICD-10-CM | POA: Diagnosis not present

## 2022-08-29 MED ORDER — TOPIRAMATE 50 MG PO TABS
50.0000 mg | ORAL_TABLET | Freq: Every day | ORAL | 0 refills | Status: DC
Start: 2022-08-29 — End: 2022-10-09

## 2022-08-29 MED ORDER — PHENTERMINE HCL 15 MG PO CAPS
30.0000 mg | ORAL_CAPSULE | ORAL | 0 refills | Status: DC
Start: 2022-08-29 — End: 2022-10-09

## 2022-08-29 MED ORDER — AUVELITY 45-105 MG PO TBCR
1.0000 | EXTENDED_RELEASE_TABLET | Freq: Two times a day (BID) | ORAL | 3 refills | Status: DC
Start: 2022-08-29 — End: 2022-08-30

## 2022-08-29 MED ORDER — METFORMIN HCL 500 MG PO TABS: ORAL_TABLET | ORAL | 1 refills | Status: DC

## 2022-08-29 NOTE — Progress Notes (Signed)
Primary Care / Sports Medicine Office Visit  Patient Information:  Patient ID: Denise Walker, female DOB: 11-Oct-1990 Age: 32 y.o. MRN: [id number]   Denise Walker is a pleasant 32 y.o. female presenting with the following:  Chief Complaint  Patient presents with   Weight Check    Vitals:   08/29/22 1529  BP: 128/78  Pulse: 82  SpO2: 98%   Vitals:   08/29/22 1529  Weight: 220 lb (99.8 kg)  Height: 4\' 11"  (1.499 m)   Body mass index is 44.43 kg/m.  No results found.   Independent interpretation of notes and tests performed by another provider:   None  Procedures performed:   None  Pertinent History, Exam, Impression, and Recommendations:   Bina was seen today for weight check.  Encounter for weight management Assessment & Plan: Stable weight noted despite aggressive dietary and lifestyle changes. Noting some tachycardia / palpitation episodes. Given relative intolerance and limited response, plan as follows:  - Wean to phentermine 15 mg dose - Increase metformin weekly to maximum tolerated dose of 2000 mg daily (can take 1000 mg twice daily) - Take new topiramate 50 mg daily - Continue healthy lifestyle changes  Orders: -     metFORMIN HCl; Take 1 tablet daily with food, increase by 1 tablet every week to a maximum dose of 4 tablets (2000 mg). Can divide doses by AM and PM .  Dispense: 90 tablet; Refill: 1 -     Phentermine HCl; Take 2 capsules (30 mg total) by mouth every morning.  Dispense: 30 capsule; Refill: 0 -     Topiramate; Take 1 tablet (50 mg total) by mouth at bedtime.  Dispense: 90 tablet; Refill: 0  Anxiety and depression Overview: Prior medications: Zoloft Lexapro  Seroquel  Celexa  Wellbutrin  Buspar  Risperdal  Prozac  Cymbalta  Effexor Trintellix  Lamictal Ativan Klonopin  Hydroxyzine   Assessment & Plan: Has seen psychiatry in the past, has been on extensive prior treatments without adequate lasting response. Discussed  interplay of health conditions with anxiety/depression.  Plan as follows: - Patient to contact [facility] to coordinate CBT - Start Auvelity given prior treatments, sample provided and Rx sent - Return in 1 month for close follow-up  Orders: -     Auvelity; Take 1 tablet by mouth in the morning and at bedtime.  Dispense: 180 tablet; Refill: 3  Non-restorative sleep Assessment & Plan: Did not get a chance to follow with ENT / Sleep Medicine for OSA evaluation. Given comorbid health history and associated risks, plan as follows:  - Referral placed to Pulmonary Sleep Medicine group  Orders: -     Ambulatory referral to Pulmonology   I provided a total time of 47 minutes including both face-to-face and non-face-to-face time on 08/30/2022 inclusive of time utilized for medical chart review, information gathering, care coordination with staff, and documentation completion.   Orders & Medications Meds ordered this encounter  Medications   Dextromethorphan-buPROPion ER (AUVELITY) 45-105 MG TBCR    Sig: Take 1 tablet by mouth in the morning and at bedtime.    Dispense:  180 tablet    Refill:  3   metFORMIN (GLUCOPHAGE) 500 MG tablet    Sig: Take 1 tablet daily with food, increase by 1 tablet every week to a maximum dose of 4 tablets (2000 mg). Can divide doses by AM and PM .    Dispense:  90 tablet    Refill:  1  phentermine 15 MG capsule    Sig: Take 2 capsules (30 mg total) by mouth every morning.    Dispense:  30 capsule    Refill:  0   topiramate (TOPAMAX) 50 MG tablet    Sig: Take 1 tablet (50 mg total) by mouth at bedtime.    Dispense:  90 tablet    Refill:  0   Orders Placed This Encounter  Procedures   Ambulatory referral to Pulmonology     Return in about 4 weeks (around 09/26/2022).     Jerrol Banana, MD, [facility]   Primary Care Sports Medicine Primary Care and Sports Medicine at [facility]

## 2022-08-30 ENCOUNTER — Encounter: Payer: Self-pay | Admitting: Family Medicine

## 2022-08-30 DIAGNOSIS — G478 Other sleep disorders: Secondary | ICD-10-CM | POA: Insufficient documentation

## 2022-08-30 MED ORDER — AUVELITY 45-105 MG PO TBCR: 1.0000 | EXTENDED_RELEASE_TABLET | Freq: Two times a day (BID) | ORAL | 3 refills | Status: DC

## 2022-08-30 NOTE — Addendum Note (Signed)
Addended by: Jerrol Banana on: 08/30/2022 01:25 PM   Modules accepted: Orders

## 2022-08-30 NOTE — Assessment & Plan Note (Signed)
Did not get a chance to follow with ENT / Sleep Medicine for OSA evaluation. Given comorbid health history and associated risks, plan as follows:  - Referral placed to Pulmonary Sleep Medicine group

## 2022-08-30 NOTE — Telephone Encounter (Signed)
fyi

## 2022-08-30 NOTE — Assessment & Plan Note (Signed)
Has seen psychiatry in the past, has been on extensive prior treatments without adequate lasting response. Discussed interplay of health conditions with anxiety/depression.  Plan as follows: - Patient to contact [facility] to coordinate CBT - Start Auvelity given prior treatments, sample provided and Rx sent - Return in 1 month for close follow-up

## 2022-08-30 NOTE — Patient Instructions (Addendum)
For mental health: - Contact [facility] to coordinate behavioral therapy: [phone] - Start Auvelity, 1 tablet daily x 3 days then increase to 1 tablet twice daily  For weight management: - Take new phentermine 15 mg dose - Increase metformin weekly to maximum tolerated dose of 2000 mg daily (can take 1000 mg twice daily) - Take new topiramate 50 mg daily - Continue healthy lifestyle changes  For sleep concern / fatigue: - Referral coordinator will contact you to schedule visit with Pulmonary Sleep Medicine group  - Return in 1 month - Contact for questions between now and then

## 2022-08-30 NOTE — Assessment & Plan Note (Deleted)
Prior medications: Zoloft Lexapro  Seroquel  Celexa  Wellbutrin  Buspar  Risperdal  Prozac  Cymbalta  Effexor Trintellix  Lamictal Ativan Klonopin  Hydroxyzine

## 2022-08-30 NOTE — Assessment & Plan Note (Signed)
>>  ASSESSMENT AND PLAN FOR NON-RESTORATIVE SLEEP WRITTEN ON 08/30/2022 12:31 PM BY Olean Sangster, Ocie Bob, MD  Did not get a chance to follow with ENT / Sleep Medicine for OSA evaluation. Given comorbid health history and associated risks, plan as follows:  - Referral placed to Pulmonary Sleep Medicine group

## 2022-08-30 NOTE — Assessment & Plan Note (Signed)
Stable weight noted despite aggressive dietary and lifestyle changes. Noting some tachycardia / palpitation episodes. Given relative intolerance and limited response, plan as follows:  - Wean to phentermine 15 mg dose - Increase metformin weekly to maximum tolerated dose of 2000 mg daily (can take 1000 mg twice daily) - Take new topiramate 50 mg daily - Continue healthy lifestyle changes

## 2022-09-04 ENCOUNTER — Encounter: Payer: Self-pay | Admitting: Family Medicine

## 2022-09-29 ENCOUNTER — Other Ambulatory Visit: Payer: Self-pay | Admitting: Family Medicine

## 2022-09-29 DIAGNOSIS — K219 Gastro-esophageal reflux disease without esophagitis: Secondary | ICD-10-CM

## 2022-09-30 ENCOUNTER — Encounter: Payer: Self-pay | Admitting: Family Medicine

## 2022-09-30 NOTE — Telephone Encounter (Signed)
Ok? pt will need refill on phentermine

## 2022-10-01 ENCOUNTER — Ambulatory Visit: Payer: Medicaid Other | Admitting: Family Medicine

## 2022-10-01 NOTE — Telephone Encounter (Signed)
Requested Prescriptions  Pending Prescriptions Disp Refills   omeprazole (PRILOSEC) 40 MG capsule [Pharmacy Med Name: OMEPRAZOLE DR 40 MG CAPSULE] 90 capsule 0    Sig: TAKE 1 CAPSULE (40 MG TOTAL) BY MOUTH DAILY.     Gastroenterology: Proton Pump Inhibitors Passed - 09/29/2022 12:22 PM      Passed - Valid encounter within last 12 months    Recent Outpatient Visits           1 month ago Encounter for weight management   [place] [facility], Ocie Bob, MD   4 months ago Encounter for weight management   [place] [facility] at [facility], Ocie Bob, MD   5 months ago Encounter for weight management    Primary Care & Sports Medicine at [facility], Ocie Bob, MD   6 months ago Annual physical exam   [facility] at [facility], Ocie Bob, MD   7 months ago Strain of gastrocnemius muscle of left lower extremity, initial encounter   [facility] Primary Care & Sports Medicine at [facility], Ocie Bob, MD       Future Appointments             In 1 week Ashley Royalty, Ocie Bob, MD [facility] Primary Care & Sports Medicine at [facility], [facility]

## 2022-10-09 ENCOUNTER — Encounter: Payer: Self-pay | Admitting: Family Medicine

## 2022-10-09 ENCOUNTER — Ambulatory Visit: Payer: Medicaid Other | Admitting: Family Medicine

## 2022-10-09 VITALS — BP 110/60 | HR 78 | Ht 59.0 in | Wt 219.0 lb

## 2022-10-09 DIAGNOSIS — F32A Depression, unspecified: Secondary | ICD-10-CM | POA: Diagnosis not present

## 2022-10-09 DIAGNOSIS — F419 Anxiety disorder, unspecified: Secondary | ICD-10-CM | POA: Diagnosis not present

## 2022-10-09 DIAGNOSIS — Z7689 Persons encountering health services in other specified circumstances: Secondary | ICD-10-CM

## 2022-10-09 MED ORDER — PHENTERMINE HCL 30 MG PO CAPS
30.0000 mg | ORAL_CAPSULE | ORAL | 2 refills | Status: DC
Start: 2022-10-09 — End: 2022-11-18

## 2022-10-09 MED ORDER — WEGOVY 0.25 MG/0.5ML ~~LOC~~ SOAJ
0.2500 mg | SUBCUTANEOUS | 2 refills | Status: DC
Start: 2022-10-09 — End: 2022-11-18

## 2022-10-09 MED ORDER — TOPIRAMATE 50 MG PO TABS
50.0000 mg | ORAL_TABLET | Freq: Two times a day (BID) | ORAL | 0 refills | Status: DC
Start: 2022-10-09 — End: 2022-11-18

## 2022-10-09 NOTE — Progress Notes (Signed)
     Primary Care / Sports Medicine Office Visit  Patient Information:  Patient ID: Fynnlee Sutphen, female DOB: Aug 18, 1990 Age: 32 y.o. MRN: [id number]   Mannat Thumann is a pleasant 32 y.o. female presenting with the following:  Chief Complaint  Patient presents with   Weight Check    Vitals:   10/09/22 0930  BP: 110/60  Pulse: 78  SpO2: 96%   Vitals:   10/09/22 0930  Weight: 219 lb (99.3 kg)  Height: 4\' 11"  (1.499 m)   Body mass index is 44.23 kg/m.  No results found.   Independent interpretation of notes and tests performed by another provider:   None  Procedures performed:   None  Pertinent History, Exam, Impression, and Recommendations:   Abryelle was seen today for weight check.  Encounter for weight management Assessment & Plan: Has been on regimen without adverse effects reported but limited weight loss. We reviewed treatment strategies at length:  - Continue current course - Seek authorization for [facility] fill given recent Medicaid coverage - If approved can wean from current regimen  Orders: -     Phentermine HCl; Take 1 capsule (30 mg total) by mouth every morning.  Dispense: 30 capsule; Refill: 2 -     Topiramate; Take 1 tablet (50 mg total) by mouth 2 (two) times daily.  Dispense: 180 tablet; Refill: 0 -     Wegovy; Inject 0.25 mg into the skin once a week.  Dispense: 2 mL; Refill: 2  Anxiety and depression Overview: Prior medications: Zoloft Lexapro  Seroquel  Celexa  Wellbutrin  Buspar  Risperdal  Prozac  Cymbalta  Effexor Trintellix  Lamictal Ativan Klonopin  Hydroxyzine  Auvelity (current)-tolerating well with positive response  Assessment & Plan: Patient continues to be compliant with twice daily Auvelity dosing, noting positive response and tolerability.  Advised patient to continue this regimen and we can follow-up as needed on this issue.      Orders & Medications Meds ordered this encounter  Medications   phentermine  30 MG capsule    Sig: Take 1 capsule (30 mg total) by mouth every morning.    Dispense:  30 capsule    Refill:  2   topiramate (TOPAMAX) 50 MG tablet    Sig: Take 1 tablet (50 mg total) by mouth 2 (two) times daily.    Dispense:  180 tablet    Refill:  0   WEGOVY 0.25 MG/0.5ML SOAJ    Sig: Inject 0.25 mg into the skin once a week.    Dispense:  2 mL    Refill:  2   No orders of the defined types were placed in this encounter.    No follow-ups on file.     Jerrol Banana, MD, [facility]   Primary Care Sports Medicine Primary Care and Sports Medicine at [facility]

## 2022-10-09 NOTE — Assessment & Plan Note (Signed)
Patient continues to be compliant with twice daily Auvelity dosing, noting positive response and tolerability.  Advised patient to continue this regimen and we can follow-up as needed on this issue.

## 2022-10-10 NOTE — Telephone Encounter (Signed)
Please advise Reginal Lutes approved

## 2022-10-11 NOTE — Assessment & Plan Note (Signed)
Has been on regimen without adverse effects reported but limited weight loss. We reviewed treatment strategies at length:  - Continue current course - Seek authorization for [facility] fill given recent Medicaid coverage - If approved can wean from current regimen

## 2022-10-22 ENCOUNTER — Encounter: Payer: Self-pay | Admitting: Family Medicine

## 2022-10-22 ENCOUNTER — Ambulatory Visit (INDEPENDENT_AMBULATORY_CARE_PROVIDER_SITE_OTHER): Payer: Medicaid Other | Admitting: Family Medicine

## 2022-10-22 VITALS — BP 122/80 | HR 100 | Temp 98.6°F | Ht 59.0 in | Wt 219.0 lb

## 2022-10-22 DIAGNOSIS — J0100 Acute maxillary sinusitis, unspecified: Secondary | ICD-10-CM | POA: Insufficient documentation

## 2022-10-22 LAB — POCT INFLUENZA A/B
Influenza A, POC: NEGATIVE
Influenza B, POC: NEGATIVE

## 2022-10-22 LAB — POC COVID19 BINAXNOW: SARS Coronavirus 2 Ag: NEGATIVE

## 2022-10-22 MED ORDER — PROMETHAZINE-DM 6.25-15 MG/5ML PO SYRP
5.0000 mL | ORAL_SOLUTION | Freq: Four times a day (QID) | ORAL | 0 refills | Status: DC | PRN
Start: 2022-10-22 — End: 2022-11-18

## 2022-10-22 NOTE — Patient Instructions (Signed)
-   Start Claritin/Zyrtec daily x 7 days - Start Flonase, 2 sprays each nostril daily x 7 days - Start Mucinex 12-hour twice daily x 7 days - Can use Rx cough syrup as needed - Continue medications for pain control - Contact our office towards the end of this week/early next week if symptoms persist without improvement for next steps

## 2022-10-22 NOTE — Assessment & Plan Note (Signed)
Patient with 3-4-day history of cough, congestion, facial pain, shortness of air, and dyspnea.  Reports that one of her children had similar symptoms prior.  Has trialed NyQuil and naproxen.  Examination with reassuring O2 sat, clear air entry bilateral lung fields without wheezes, rales, rhonchi, benign cardiac sounds, tympanic membranes clear as are canals, turbinates are erythematous without swelling, oropharynx benign, tenderness about the maxillary sinuses bilaterally, no LAD  Findings are present sinusitis, given sick contacts and duration of symptoms, viral etiology of current concern.  Plan: - Start Claritin/Zyrtec daily x 7 days - Start Flonase, 2 sprays each nostril daily x 7 days - Start Mucinex 12-hour twice daily x 7 days - Can use Rx cough syrup as needed - Continue medications for pain control - Contact our office towards the end of this week/early next week if symptoms persist without improvement for next steps - Can consider antibiotics at that time if indicated

## 2022-10-22 NOTE — Progress Notes (Signed)
     Primary Care / Sports Medicine Office Visit  Patient Information:  Patient ID: Denise Walker, female DOB: 06-Jan-1991 Age: 32 y.o. MRN: [id number]   Denise Walker is a pleasant 32 y.o. female presenting with the following:  Chief Complaint  Patient presents with   Shortness of Breath    Vitals:   10/22/22 0847  BP: 122/80  Pulse: 100  Temp: 98.6 F (37 C)  SpO2: 98%   Vitals:   10/22/22 0847  Weight: 219 lb (99.3 kg)  Height: 4\' 11"  (1.499 m)   Body mass index is 44.23 kg/m.  No results found.   Independent interpretation of notes and tests performed by another provider:   None  Procedures performed:   None  Pertinent History, Exam, Impression, and Recommendations:   Denise Walker was seen today for shortness of breath.  Acute non-recurrent maxillary sinusitis Assessment & Plan: Patient with 3-4-day history of cough, congestion, facial pain, shortness of air, and dyspnea.  Reports that one of her children had similar symptoms prior.  Has trialed NyQuil and naproxen.  Examination with reassuring O2 sat, clear air entry bilateral lung fields without wheezes, rales, rhonchi, benign cardiac sounds, tympanic membranes clear as are canals, turbinates are erythematous without swelling, oropharynx benign, tenderness about the maxillary sinuses bilaterally, no LAD  Findings are present sinusitis, given sick contacts and duration of symptoms, viral etiology of current concern.  Plan: - Start Claritin/Zyrtec daily x 7 days - Start Flonase, 2 sprays each nostril daily x 7 days - Start Mucinex 12-hour twice daily x 7 days - Can use Rx cough syrup as needed - Continue medications for pain control - Contact our office towards the end of this week/early next week if symptoms persist without improvement for next steps - Can consider antibiotics at that time if indicated  Orders: -     POCT Influenza A/B -     POC COVID-19 BinaxNow -     Promethazine-DM; Take 5 mLs by mouth 4  (four) times daily as needed for cough.  Dispense: 118 mL; Refill: 0     Orders & Medications Meds ordered this encounter  Medications   promethazine-dextromethorphan (PROMETHAZINE-DM) 6.25-15 MG/5ML syrup    Sig: Take 5 mLs by mouth 4 (four) times daily as needed for cough.    Dispense:  118 mL    Refill:  0   Orders Placed This Encounter  Procedures   POCT Influenza A/B   POC COVID-19     No follow-ups on file.     Jerrol Banana, MD, [facility]   Primary Care Sports Medicine Primary Care and Sports Medicine at [facility]

## 2022-10-22 NOTE — Assessment & Plan Note (Deleted)
Patient with 3-4-day history of cough, congestion, facial pain, shortness of air, and dyspnea.  Reports that one of her children had similar symptoms prior.  Has trialed NyQuil and naproxen.  Examination with reassuring O2 sat, clear air entry bilateral lung fields without wheezes, rales, rhonchi, benign cardiac sounds, tympanic membranes clear as are canals, turbinates are erythematous without swelling, oropharynx benign, tenderness about the maxillary sinuses bilaterally, no LAD  Findings are present sinusitis, given sick contacts and duration of symptoms, viral etiology of current concern.  Plan: - Start Claritin/Zyrtec daily x 7 days - Start Flonase, 2 sprays each nostril daily x 7 days - Start Mucinex 12-hour twice daily x 7 days - Can use Rx cough syrup as needed - Continue medications for pain control - Contact our office towards the end of this week/early next week if symptoms persist without improvement for next steps - Can consider antibiotics at that time if indicated

## 2022-10-23 ENCOUNTER — Encounter: Payer: Self-pay | Admitting: Family Medicine

## 2022-10-24 ENCOUNTER — Other Ambulatory Visit: Payer: Self-pay | Admitting: Family Medicine

## 2022-10-24 DIAGNOSIS — J0100 Acute maxillary sinusitis, unspecified: Secondary | ICD-10-CM

## 2022-10-24 MED ORDER — AMOXICILLIN-POT CLAVULANATE 875-125 MG PO TABS: 1.0000 | ORAL_TABLET | Freq: Two times a day (BID) | ORAL | 0 refills | Status: AC

## 2022-10-24 NOTE — Telephone Encounter (Signed)
Please advise 

## 2022-11-04 ENCOUNTER — Encounter: Payer: Self-pay | Admitting: Family Medicine

## 2022-11-11 ENCOUNTER — Encounter: Payer: Self-pay | Admitting: Family Medicine

## 2022-11-11 NOTE — Telephone Encounter (Signed)
Please advise 

## 2022-11-13 ENCOUNTER — Other Ambulatory Visit: Payer: Self-pay

## 2022-11-13 DIAGNOSIS — Z7689 Persons encountering health services in other specified circumstances: Secondary | ICD-10-CM

## 2022-11-13 DIAGNOSIS — Z6841 Body Mass Index (BMI) 40.0 and over, adult: Secondary | ICD-10-CM

## 2022-11-13 MED ORDER — WEGOVY 0.5 MG/0.5ML ~~LOC~~ SOAJ
0.5000 mg | SUBCUTANEOUS | 2 refills | Status: DC
Start: 2022-11-13 — End: 2023-01-02

## 2022-11-18 ENCOUNTER — Ambulatory Visit: Payer: Medicaid Other | Admitting: Primary Care

## 2022-11-18 ENCOUNTER — Encounter: Payer: Self-pay | Admitting: Primary Care

## 2022-11-18 VITALS — BP 110/82 | HR 80 | Temp 97.8°F | Ht 59.0 in

## 2022-11-18 DIAGNOSIS — E559 Vitamin D deficiency, unspecified: Secondary | ICD-10-CM

## 2022-11-18 DIAGNOSIS — G4719 Other hypersomnia: Secondary | ICD-10-CM

## 2022-11-18 NOTE — Progress Notes (Signed)
@Patient  ID: Denise Walker, female    DOB: [birth year], 32 y.o.   MRN: [id number]  Chief Complaint  Patient presents with   Consult    Daytime sleepiness and fatigue.     Referring provider: Jerrol Banana, MD  HPI: 32 year old female, never smoked.  Past medical history significant for migraine headaches, recurrent sinusitis, GERD, PCOS, ADHD, anxiety/depression, obesity.  11/18/2022 Patient presents today for sleep consult due to excessive daytime sleepiness.  She reports feeling tired throughout the day with no energy.  She has no snoring symptoms.  She has never woken herself up gasping or choking.  Sleep is typically not disrupted unless one of her children comes into her room.  Typical bedtime is between 11 and 11:30 PM.  It takes her less than 10 minutes about sleep.  She wakes up usually no more than once per night.  She starts her day between 6 and 6:30 AM.  No previous sleep studies.  Weight is up 30 pounds.  She had some basic labs with her primary care regarding fatigue symptoms.  TSH was normal.  Vitamin D level was deficient.  No concern for narcolepsy, cataplexy, sleep walking or RLS.   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  Negative on 7/20 and 9/21.  Rhogam received 9/21     Anxiety    Back pain    low back pain with lumbar radiculitis   Bilateral shoulder pain    BRCA negative 04/2018   MyRisk neg; IBIS=8.9%/riskscore=6.7%   Bronchitis 01/21/2021   Eclampsia    Family history of  ovarian cancer    Gestational diabetes    History of cesarean delivery 02/10/2018   History of eclampsia 07/22/2017   [X]  Aspirin 81 mg daily after 12 weeks; discontinue after 36 weeks     Baseline and surveillance labs (pulled in from EPIC, refresh links as needed)           Lab Results      Component    Value    Date           PLT    292    07/22/2017           CREATININE    0.73    07/22/2017           AST    16    07/22/2017           ALT    13    07/22/2017           PROTCRRATIO    69    07/16/2014             Migraine headache    PCOS (polycystic ovarian syndrome)     Tobacco History: Social History   Tobacco Use  Smoking Status Never  Smokeless Tobacco Never   Counseling given: Not Answered   Outpatient Medications Prior to Visit  Medication Sig Dispense Refill   Dextromethorphan-buPROPion ER (AUVELITY) 45-105 MG TBCR Take 1 tablet by mouth in the morning  and at bedtime. 180 tablet 3   fluticasone (FLONASE) 50 MCG/ACT nasal spray Place 2 sprays into both nostrils daily. 16 g 0   levonorgestrel (MIRENA) 20 MCG/24HR IUD by Intrauterine route.     meloxicam (MOBIC) 7.5 MG tablet Take 1-2 tablets (7.5-15 mg total) by mouth daily as needed for pain. 60 tablet 0   methocarbamol (ROBAXIN) 500 MG tablet Take 1 tablet (500 mg total) by mouth at bedtime as needed for muscle spasms. 45 tablet 0   omeprazole (PRILOSEC) 40 MG capsule TAKE 1 CAPSULE (40 MG TOTAL) BY MOUTH DAILY. 90 capsule 0   WEGOVY 0.5 MG/0.5ML SOAJ Inject 0.5 mg into the skin once a week. Use this dose for 1 month (4 shots) and then increase to next higher dose. 2 mL 2   metFORMIN (GLUCOPHAGE) 500 MG tablet Take 1 tablet daily with food, increase by 1 tablet every week to a maximum dose of 4 tablets (2000 mg). Can divide doses by AM and PM . (Patient not taking: Reported on 11/18/2022) 90 tablet 1   phentermine 30 MG capsule Take 1 capsule (30 mg total) by mouth every morning. (Patient not taking: Reported on 11/18/2022) 30  capsule 2   promethazine-dextromethorphan (PROMETHAZINE-DM) 6.25-15 MG/5ML syrup Take 5 mLs by mouth 4 (four) times daily as needed for cough. (Patient not taking: Reported on 11/18/2022) 118 mL 0   topiramate (TOPAMAX) 50 MG tablet Take 1 tablet (50 mg total) by mouth 2 (two) times daily. (Patient not taking: Reported on 11/18/2022) 180 tablet 0   Vitamin D, Ergocalciferol, (DRISDOL) 1.25 MG (50000 UNIT) CAPS capsule Take 1 capsule (50,000 Units total) by mouth every 7 (seven) days. Take for 8 total doses(weeks) (Patient not taking: Reported on 11/18/2022) 8 capsule 0   WEGOVY 0.25 MG/0.5ML SOAJ Inject 0.25 mg into the skin once a week. (Patient not taking: Reported on 11/18/2022) 2 mL 2   No facility-administered medications prior to visit.   Review of Systems  Review of Systems  Constitutional: Negative.   HENT: Negative.    Respiratory: Negative.    Cardiovascular: Negative.     Physical Exam  BP 110/82 (BP Location: Left Arm, Patient Position: Sitting, Cuff Size: Normal)   Pulse 80   Temp 97.8 F (36.6 C) (Temporal)   Ht 4\' 11"  (1.499 m)   SpO2 98%   BMI 44.23 kg/m  Physical Exam Constitutional:      Appearance: Normal appearance.  HENT:     Head: Normocephalic and atraumatic.     Mouth/Throat:     Mouth: Mucous membranes are moist.     Pharynx: Oropharynx is clear.  Cardiovascular:     Rate and Rhythm: Normal rate and regular rhythm.  Pulmonary:     Effort: Pulmonary effort is normal.     Breath sounds: Normal breath sounds.  Neurological:     General: No focal deficit present.     Mental Status: She is alert and oriented to person, place, and time. Mental status is at baseline.  Psychiatric:        Mood and Affect: Mood normal.        Behavior: Behavior normal.        Thought Content: Thought content normal.        Judgment: Judgment normal.      Lab Results:  CBC    Component Value Date/Time   WBC 6.6 03/27/2022 0858   WBC 12.5 (H) 01/02/2019 0951   RBC  4.37 03/27/2022 0858  RBC 3.23 (L) 01/02/2019 0951   HGB 13.0 03/27/2022 0858   HCT 38.3 03/27/2022 0858   PLT 252 03/27/2022 0858   MCV 88 03/27/2022 0858   MCV 90 10/11/2013 1214   MCH 29.7 03/27/2022 0858   MCH 26.6 01/02/2019 0951   MCHC 33.9 03/27/2022 0858   MCHC 32.0 01/02/2019 0951   RDW 13.3 03/27/2022 0858   RDW 13.3 10/11/2013 1214   LYMPHSABS 2.0 04/06/2020 1449   MONOABS 1.0 (H) 07/15/2014 2241   EOSABS 0.1 04/06/2020 1449   BASOSABS 0.0 04/06/2020 1449    BMET    Component Value Date/Time   NA 139 03/27/2022 0858   NA 140 10/11/2013 1214   K 4.3 03/27/2022 0858   K 3.7 10/11/2013 1214   CL 104 03/27/2022 0858   CL 105 10/11/2013 1214   CO2 21 03/27/2022 0858   CO2 27 10/11/2013 1214   GLUCOSE 95 03/27/2022 0858   GLUCOSE 82 12/31/2018 0630   GLUCOSE 101 (H) 10/11/2013 1214   BUN 16 03/27/2022 0858   BUN 7 10/11/2013 1214   CREATININE 0.89 03/27/2022 0858   CREATININE 0.83 10/11/2013 1214   CALCIUM 9.8 03/27/2022 0858   CALCIUM 9.0 10/11/2013 1214   GFRNONAA 72 04/06/2020 1449   GFRNONAA >60 10/11/2013 1214   GFRAA 83 04/06/2020 1449   GFRAA >60 10/11/2013 1214    BNP No results found for: "BNP"  ProBNP No results found for: "PROBNP"  Imaging: No results found.   Assessment & Plan:   Excessive daytime sleepiness - Patient has symptoms of excessive daytime sleepiness/nonrestorative sleep. She reports having little to no energy but does not fall asleep when in active. Epworth score 2. Obesity puts her at high risk for obstructive sleep apnea.  She needs a home sleep study evaluate for OSA.  We discussed treatment options for sleep apnea including weight loss, oral appliance, CPAP therapy referral to ENT for possible surgical options.  Advised patient aim to get 6 to 8 hours of sleep per night, naps encouraged. Focus on side sleeping position and work on weight loss. Recommend she also get between 20 to 30 minutes of physical exercise 5 times a  week.  Follow-up 2 weeks after sleep study completed to review results and treatment options if needed.  If sleep study is negative had a normal follow-up with her primary care.   Vitamin D deficiency - Could be contributing to fatigue. Follow up with PCP    Glenford Bayley, NP 11/18/2022

## 2022-11-18 NOTE — Patient Instructions (Signed)
- We will check home sleep study to rule out sleep apnea as cause of your fatigue - Aim to get 6-8 hours of sleep a night, naps and caffeine can be used to help with hypersomnia - Get some physical exercise 5 times a week  - Focus on well balanced diet with protein sources and stay well hydrated  - If sleep study is negative recommend following back up with PCP- we may need to look into other testing (more specific lab work or testing to rule out cardiac, autoimmune, endocrine disorders). Medications can sometimes be used to help if no underlying causes can be identified.  Follow-up Call/send mychart message 2 weeks after turning in sleep study to get results/ if you have sleep apnea we can do a virtual visit to discuss and start CPAP treatment    Chronic Fatigue Syndrome Chronic fatigue syndrome (CFS) is a condition that causes extreme tiredness (fatigue). This condition is also known as myalgic encephalomyelitis (ME). The fatigue in CFS does not improve with rest, and it gets worse with physical or mental activity. Several other symptoms may occur along with fatigue. Symptoms may come and go, but they generally last for months. Sometimes, CFS gets better over time. In other cases, it can be a lifelong condition. There is no cure, but there are many possible treatments. You will need to work with your health care providers to find a treatment plan that works best for you. What are the causes? The cause of this condition is not known. CFS may be caused by a combination of things. Possible causes include: An infection. An abnormal body defense system (abnormal immune system). Changes in how your body produces energy. Genetic factors. Physical or emotional stress. Having a poor diet. What increases the risk? You are more likely to develop this condition if: You are female. You are a young adult or middle-aged adult. You have a family history of CFS. What are the signs or symptoms? The  main symptom of this condition is fatigue that is severe enough to interfere with day-to-day activities. This fatigue does not get better with rest, and it gets worse with physical or mental activity. There are eight other major symptoms of CFS: Discomfort and lack of energy (malaise) that lasts more than 24 hours after physical activity. Sleep that does not relieve fatigue (unrefreshing sleep). Short-term memory loss or confusion. Joint pain without redness or swelling. Muscle aches. Headaches. Painful and swollen glands (lymph nodes) in the neck or under the arms. Sore throat. Other symptoms can include: Cramps in the abdomen, constipation, or diarrhea (irritable bowel syndrome). Chills and night sweats. Vision changes. Dizziness and mental confusion (brain fog). Clumsiness. Sensitivity to food, noise, or odors. Mood swings, depression, or anxiety attacks. How is this diagnosed? There are no tests that can diagnose this condition. Your health care provider will make the diagnosis based on: Your symptoms and medical history. A physical exam and a mental health exam. Tests to rule out other conditions. It is important to make sure that your symptoms are not caused by another medical condition. Tests may include lab tests or X-rays. For your health care provider to diagnose CFS: You must have had fatigue for at least 6 months in a row. Fatigue must be your first symptom, and it must be severe enough to interfere with day-to-day activities. You must also have at least four of the eight other major symptoms of CFS. There must be no other cause found for the fatigue.  How is this treated? There is no cure for CFS. The condition affects everyone differently. You will need to work with your team of health care providers to find the best treatments for your symptoms. Your team may include your primary care provider, physical and exercise therapists, and mental health therapists. Treatment may  include: Having a regular bedtime routine to help improve your sleep. Avoiding caffeine and alcohol. Doing light exercise and stretching during the day. You may also want to try movement exercises, such as yoga or tai chi. Taking medicines to help you sleep or to relieve joint or muscle pain. Learning and practicing relaxation techniques, such as deep breathing and muscle relaxation. Using memory aids or doing puzzles to improve memory and concentration. Getting care for your body and mental well-being, such as: Seeing a mental health therapist to evaluate and treat depression, if necessary. Cognitive behavioral therapy (CBT). This therapy changes the way you think or act in response to the fatigue. This may help improve how you feel. Trying massage therapy and acupuncture. Follow these instructions at home: Eating and drinking  Avoid caffeine and alcohol. Avoid heavy meals in the evening. Eat a healthy diet that includes foods such as vegetables, fruits, fish, and lean meats. Activity Rest as told by your health care provider. Avoid fatigue by pacing yourself during the day and getting enough sleep at night. Exercise as told by your health care provider. Go to bed and get up at the same time every day. Lifestyle Ask your health care provider whether you should keep a journal. Your health care provider will tell you what information to write in the journal. This may include when you have fatigue and how medicines and other behaviors or treatments help to reduce the fatigue. Consider joining a CFS support group. Avoid stress, and use stress-reducing techniques that you learn in therapy. General instructions Take over-the-counter and prescription medicines only as told by your health care provider. Do not use herbal or dietary supplements unless they are approved by your health care provider. Maintain a healthy weight. Keep all follow-up visits. This is important. Where to find more  information Get more information or find a support group near you at one of these links: American Myalgic Encephalomyelitis and Chronic Fatigue Syndrome Society: ammes.org Centers for Disease Control and Prevention: FootballExhibition.com.br Contact a health care provider if: Your symptoms do not get better or they get worse. You feel angry, guilty, anxious, or depressed. Get help right away if: You have thoughts about hurting yourself or others. Get help right away if you feel like you may hurt yourself or others, or have thoughts about taking your own life. Go to your nearest emergency room or: Call 911. Call the National Suicide Prevention Lifeline at (540)574-9648 or 988. This is open 24 hours a day. Text the Crisis Text Line at 2050320393. Summary Chronic fatigue syndrome (CFS) is a condition that causes extreme tiredness (fatigue). This fatigue does not improve with rest, and it gets worse with physical or mental activity. There is no cure for CFS. The condition affects everyone differently. You will need to work with your team of health care providers to find the best treatments for your symptoms. Avoid stress, and use stress-reducing techniques that you learn in therapy. Contact a health care provider if your symptoms do not get better or they get worse. This information is not intended to replace advice given to you by your health care provider. Make sure you discuss any questions you have  with your health care provider. Document Revised: 11/20/2020 Document Reviewed: 11/20/2020 Elsevier Patient Education  2024 [employer].

## 2022-11-18 NOTE — Assessment & Plan Note (Addendum)
-   Patient has symptoms of excessive daytime sleepiness/nonrestorative sleep. She reports having little to no energy but does not fall asleep when in active. Epworth score 2. Obesity puts her at high risk for obstructive sleep apnea.  She needs a home sleep study evaluate for OSA.  We discussed treatment options for sleep apnea including weight loss, oral appliance, CPAP therapy referral to ENT for possible surgical options.  Advised patient aim to get 6 to 8 hours of sleep per night, naps encouraged. Focus on side sleeping position and work on weight loss. Recommend she also get between 20 to 30 minutes of physical exercise 5 times a week.  Follow-up 2 weeks after sleep study completed to review results and treatment options if needed.  If sleep study is negative had a normal follow-up with her primary care.

## 2022-11-18 NOTE — Assessment & Plan Note (Signed)
-   Could be contributing to fatigue. Follow up with PCP

## 2022-12-09 ENCOUNTER — Other Ambulatory Visit: Payer: Self-pay

## 2022-12-10 ENCOUNTER — Other Ambulatory Visit: Payer: Self-pay

## 2022-12-10 MED ORDER — WEGOVY 0.5 MG/0.5ML ~~LOC~~ SOAJ
0.5000 mg | SUBCUTANEOUS | 1 refills | Status: DC
  Filled 2022-12-10: qty 2, 28d supply, fill #0

## 2022-12-20 ENCOUNTER — Ambulatory Visit: Payer: Medicaid Other

## 2022-12-20 DIAGNOSIS — G4719 Other hypersomnia: Secondary | ICD-10-CM

## 2023-01-02 ENCOUNTER — Ambulatory Visit: Payer: Medicaid Other | Admitting: Family Medicine

## 2023-01-02 ENCOUNTER — Ambulatory Visit: Payer: Self-pay

## 2023-01-02 ENCOUNTER — Encounter: Payer: Self-pay | Admitting: Family Medicine

## 2023-01-02 VITALS — BP 102/78 | HR 92 | Ht 59.0 in | Wt 208.0 lb

## 2023-01-02 DIAGNOSIS — K5909 Other constipation: Secondary | ICD-10-CM | POA: Diagnosis not present

## 2023-01-02 DIAGNOSIS — J309 Allergic rhinitis, unspecified: Secondary | ICD-10-CM | POA: Diagnosis not present

## 2023-01-02 DIAGNOSIS — F32A Depression, unspecified: Secondary | ICD-10-CM

## 2023-01-02 DIAGNOSIS — F419 Anxiety disorder, unspecified: Secondary | ICD-10-CM | POA: Diagnosis not present

## 2023-01-02 DIAGNOSIS — Z7689 Persons encountering health services in other specified circumstances: Secondary | ICD-10-CM

## 2023-01-02 MED ORDER — SEMAGLUTIDE-WEIGHT MANAGEMENT 1 MG/0.5ML ~~LOC~~ SOAJ
1.0000 mg | SUBCUTANEOUS | 0 refills | Status: DC
Start: 2023-01-02 — End: 2023-06-04

## 2023-01-02 NOTE — Assessment & Plan Note (Signed)
The patient also has a history of constipation, which she manages with fiber and Miralax.  Constipation History of constipation, currently managed with fiber and Miralax. -Continue current regimen, consider adding Colace if needed. -Stay hydrated, especially when taking additional medications

## 2023-01-02 NOTE — Progress Notes (Signed)
     Primary Care / Sports Medicine Office Visit  Patient Information:  Patient ID: Denise Walker, female DOB: 06-10-1990 Age: 32 y.o. MRN: [id number]   Denise Walker is a pleasant 32 y.o. female presenting with the following:  Chief Complaint  Patient presents with   Anxiety    Pt is going on a trip and going by plane wants something to calm her down, wants to speak about something to make her ears pops    Vitals:   01/02/23 1328  BP: 102/78  Pulse: 92  SpO2: 96%   Vitals:   01/02/23 1328  Weight: 208 lb (94.3 kg)  Height: 4\' 11"  (1.499 m)   Body mass index is 42.01 kg/m.  No results found.   Independent interpretation of notes and tests performed by another provider:   None  Procedures performed:   None  Pertinent History, Exam, Impression, and Recommendations:   Problem List Items Addressed This Visit       Respiratory   Allergic rhinitis    Mentions a history of ear pressure issues during flights, which cause discomfort and a sensation of hearing through cotton. She has tried various remedies such as chewing gum and yawning but has not found a solution that works consistently.  Ear Pressure Changes Reports discomfort and pressure changes in ears during flights and at high altitudes. No recent issues with allergies. -Use Flonase and Claritin as needed for ear pressure changes during flight. -Consider use of Sudafed if symptoms persist.        Digestive   Chronic constipation    The patient also has a history of constipation, which she manages with fiber and Miralax.  Constipation History of constipation, currently managed with fiber and Miralax. -Continue current regimen, consider adding Colace if needed. -Stay hydrated, especially when taking additional medications        Other   Encounter for weight management    The patient is also on [facility] for weight loss and has lost 11 pounds since starting the medication. She has two doses left and  would like to increase the dose once she finishes the current supply.  Weight Management Currently on Wegovy for weight management, down 11 pounds. -Continue Wegovy, increase dose to 1 mg weekly. -Schedule follow-up visit in 6 weeks to monitor progress.      Relevant Medications   Semaglutide-Weight Management 1 MG/0.5ML SOAJ   Anxiety and depression - Primary    Presents with concerns about an upcoming flight and trip. She expresses a fear of flying and is anxious about the upcoming travel.  Travel Anxiety Experiencing anxiety related to upcoming flight. Has previously responded well to Hydroxyzine 25mg . -Use Hydroxyzine 25mg  as needed for anxiety, up to 2 tablets every 6 hours.        Orders & Medications Medications:  Meds ordered this encounter  Medications   Semaglutide-Weight Management 1 MG/0.5ML SOAJ    Sig: Inject 1 mg into the skin once a week.    Dispense:  2 mL    Refill:  0   No orders of the defined types were placed in this encounter.    No follow-ups on file.     Jerrol Banana, MD, [facility]   Primary Care Sports Medicine Primary Care and Sports Medicine at [facility]

## 2023-01-02 NOTE — Assessment & Plan Note (Signed)
Mentions a history of ear pressure issues during flights, which cause discomfort and a sensation of hearing through cotton. She has tried various remedies such as chewing gum and yawning but has not found a solution that works consistently.  Ear Pressure Changes Reports discomfort and pressure changes in ears during flights and at high altitudes. No recent issues with allergies. -Use Flonase and Claritin as needed for ear pressure changes during flight. -Consider use of Sudafed if symptoms persist.

## 2023-01-02 NOTE — Patient Instructions (Signed)
VISIT SUMMARY:  During today's visit, we discussed your concerns about travel anxiety, ear pressure changes during flights, weight management, and constipation. We reviewed your current medications and made adjustments to help manage your symptoms effectively.  YOUR PLAN:  -TRAVEL ANXIETY: You have been prescribed Hydroxyzine 25mg  to take as needed for anxiety, up to 2 tablets every 6 hours.  -EAR PRESSURE CHANGES:  You can use Flonase and Claritin as needed for ear pressure changes, and consider using Sudafed if symptoms persist.  -WEIGHT MANAGEMENT: You are currently on Wegovy and have lost 11 pounds. Continue taking Wegovy and increase the dose to1 mg after completing the remaining 0.5 mg doses. We will schedule a follow-up visit in 6 weeks to monitor your progress.  -CONSTIPATION: Continue your current regimen of fiber and Miralax, and consider adding Colace if needed. Stay hydrated, especially when taking additional medications.  INSTRUCTIONS:  Please follow up in 6 weeks to monitor your weight management progress and discuss any ongoing or new concerns.

## 2023-01-02 NOTE — Assessment & Plan Note (Signed)
The patient is also on [facility] for weight loss and has lost 11 pounds since starting the medication. She has two doses left and would like to increase the dose once she finishes the current supply.  Weight Management Currently on Wegovy for weight management, down 11 pounds. -Continue Wegovy, increase dose to 1 mg weekly. -Schedule follow-up visit in 6 weeks to monitor progress.

## 2023-01-02 NOTE — Telephone Encounter (Signed)
  Chief Complaint: medication request Symptoms: flying and anxiety and has dizziness when at higher altitudes and ears popping  Frequency: trip tomorrow thru Monday  Pertinent Negatives: NA Disposition: [] ED /[] Urgent Care (no appt availability in office) / [x] Appointment(In office/virtual)/ []  [facility]/ [] Home Care/ [] Refused Recommended Disposition /[] [facility]/ []  Follow-up with PCP Additional Notes: scheduled OV today at 1320 with PCP.   Reason for Disposition  Prescription request for new medicine (not a refill)  Answer Assessment - Initial Assessment Questions 1. NAME of MEDICINE: "What medicine(s) are you calling about?"     Medication to help with flying and being in air  2. QUESTION: "What is your question?" (e.g., double dose of medicine, side effect)     Medication to help with flying and being in air  3. PRESCRIBER: "Who prescribed the medicine?" Reason: if prescribed by specialist, call should be referred to that group.     None previously  4. SYMPTOMS: "Do you have any symptoms?" If Yes, ask: "What symptoms are you having?"  "How bad are the symptoms (e.g., mild, moderate, severe)     Anxiety and has dizziness when up in elevated climates  Protocols used: Medication Question Call-A-AH

## 2023-01-02 NOTE — Assessment & Plan Note (Addendum)
Presents with concerns about an upcoming flight and trip. She expresses a fear of flying and is anxious about the upcoming travel.  Travel Anxiety Experiencing anxiety related to upcoming flight. Has previously responded well to Hydroxyzine 25mg . -Use Hydroxyzine 25mg  as needed for anxiety, up to 2 tablets every 6 hours.

## 2023-01-13 ENCOUNTER — Ambulatory Visit: Payer: Medicaid Other | Admitting: Family Medicine

## 2023-01-15 ENCOUNTER — Telehealth: Payer: Self-pay | Admitting: Primary Care

## 2023-01-15 ENCOUNTER — Encounter: Payer: Self-pay | Admitting: Family Medicine

## 2023-01-15 DIAGNOSIS — G4719 Other hypersomnia: Secondary | ICD-10-CM

## 2023-01-15 NOTE — Telephone Encounter (Signed)
Form for [facility] filled and and faxed to [facility]

## 2023-01-15 NOTE — Telephone Encounter (Signed)
Denise Walker you order a HST to be done on this patient and she was scheduled and tried to do it. Patient states she will need to do in lab sleep study because of her children. They want leave her alone to sleep. Would you be willing to put in order for in lab sleep study

## 2023-01-15 NOTE — Telephone Encounter (Signed)
Ordered, I put [place] hope that is correct

## 2023-01-16 NOTE — Telephone Encounter (Signed)
Please review.  KP

## 2023-01-19 ENCOUNTER — Telehealth: Payer: Medicaid Other | Admitting: Family Medicine

## 2023-01-19 DIAGNOSIS — N390 Urinary tract infection, site not specified: Secondary | ICD-10-CM

## 2023-01-19 MED ORDER — NITROFURANTOIN MONOHYD MACRO 100 MG PO CAPS: 100.0000 mg | ORAL_CAPSULE | Freq: Two times a day (BID) | ORAL | 0 refills | Status: AC

## 2023-01-19 NOTE — Progress Notes (Signed)
Virtual Visit Consent   Denise Walker, you are scheduled for a virtual visit with a [facility] provider today. Just as with appointments in the office, your consent must be obtained to participate. Your consent will be active for this visit and any virtual visit you may have with one of our providers in the next 365 days. If you have a MyChart account, a copy of this consent can be sent to you electronically.  As this is a virtual visit, video technology does not allow for your provider to perform a traditional examination. This may limit your provider's ability to fully assess your condition. If your provider identifies any concerns that need to be evaluated in person or the need to arrange testing (such as labs, EKG, etc.), we will make arrangements to do so. Although advances in technology are sophisticated, we cannot ensure that it will always work on either your end or our end. If the connection with a video visit is poor, the visit may have to be switched to a telephone visit. With either a video or telephone visit, we are not always able to ensure that we have a secure connection.  By engaging in this virtual visit, you consent to the provision of healthcare and authorize for your insurance to be billed (if applicable) for the services provided during this visit. Depending on your insurance coverage, you may receive a charge related to this service.  I need to obtain your verbal consent now. Are you willing to proceed with your visit today? SHANETA OLEKSA has provided verbal consent on 01/19/2023 for a virtual visit (video or telephone). Georgana Curio, FNP  Date: 01/19/2023 1:06 PM  Virtual Visit via Video Note   I, Georgana Curio, connected with  RONA HILDITCH  ([id number], 05/26/1990) on 01/19/23 at  1:00 PM EST by a video-enabled telemedicine application and verified that I am speaking with the correct person using two identifiers.  Location: Patient: Virtual Visit Location Patient:  Home Provider: Virtual Visit Location Provider: Home Office   I discussed the limitations of evaluation and management by telemedicine and the availability of in person appointments. The patient expressed understanding and agreed to proceed.    History of Present Illness: Denise Walker is a 32 y.o. who identifies as a female who was assigned female at birth, and is being seen today for burning and frequency on urination for 2 days persistent. Low back pressure. No fever. Marland Kitchen  HPI: HPI  Problems:  Patient Active Problem List   Diagnosis Date Noted   Chronic constipation 01/02/2023   Allergic rhinitis 01/02/2023   Excessive daytime sleepiness 11/18/2022   Vitamin D deficiency 11/18/2022   Gastroesophageal reflux disease without esophagitis 06/02/2022   Recurrent sinusitis 04/19/2022   Annual physical exam 03/21/2022   BMI 45.0-49.9, adult (HCC) 03/21/2022   Encounter for weight management 02/18/2022   Mixed hyperlipidemia 06/04/2021   Prediabetes 06/04/2021   PCOS (polycystic ovarian syndrome) 05/01/2021   Chronic pain of right knee 05/01/2021   Chronic foot pain, left 05/01/2021   Chronic post-traumatic stress disorder (PTSD) 12/21/2020   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   ADHD (attention deficit hyperactivity disorder), 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   High risk medication use 06/29/2020   History of ADHD 06/29/2020   Uterine scar from previous cesarean delivery affecting pregnancy 12/31/2018   BRCA gene mutation negative 05/04/2018   Rh negative state in antepartum  period 08/23/2017   Migraines 04/09/2017   Anxiety and depression 12/01/2014    Allergies:  Allergies  Allergen Reactions   Fish Allergy Shortness Of Breath   Shellfish Allergy Shortness Of Breath   Rizatriptan Other (See Comments)    "Head pressure, neck locking up" "Head pressure, neck locking  up"   Trazodone And Nefazodone Other (See Comments)    Headache   Medications:  Current Outpatient Medications:    nitrofurantoin, macrocrystal-monohydrate, (MACROBID) 100 MG capsule, Take 1 capsule (100 mg total) by mouth 2 (two) times daily for 7 days., Disp: 14 capsule, Rfl: 0   Dextromethorphan-buPROPion ER (AUVELITY) 45-105 MG TBCR, Take 1 tablet by mouth in the morning and at bedtime., Disp: 180 tablet, Rfl: 3   fluticasone (FLONASE) 50 MCG/ACT nasal spray, Place 2 sprays into both nostrils daily., Disp: 16 g, Rfl: 0   levonorgestrel (MIRENA) 20 MCG/24HR IUD, by Intrauterine route., Disp: , Rfl:    meloxicam (MOBIC) 7.5 MG tablet, Take 1-2 tablets (7.5-15 mg total) by mouth daily as needed for pain., Disp: 60 tablet, Rfl: 0   methocarbamol (ROBAXIN) 500 MG tablet, Take 1 tablet (500 mg total) by mouth at bedtime as needed for muscle spasms., Disp: 45 tablet, Rfl: 0   omeprazole (PRILOSEC) 40 MG capsule, TAKE 1 CAPSULE (40 MG TOTAL) BY MOUTH DAILY., Disp: 90 capsule, Rfl: 0   Semaglutide-Weight Management 1 MG/0.5ML SOAJ, Inject 1 mg into the skin once a week., Disp: 2 mL, Rfl: 0  Observations/Objective: Patient is well-developed, well-nourished in no acute distress.  Resting comfortably  at home.  Head is normocephalic, atraumatic.  No labored breathing.  Speech is clear and coherent with logical content.  Patient is alert and oriented at baseline.    Assessment and Plan: 1. Urinary tract infection without hematuria, site unspecified  Increase fluids, preventative measures disucssed, UC if sx worsen.   Follow Up Instructions: I discussed the assessment and treatment plan with the patient. The patient was provided an opportunity to ask questions and all were answered. The patient agreed with the plan and demonstrated an understanding of the instructions.  A copy of instructions were sent to the patient via MyChart unless otherwise noted below.     The patient was advised to  call back or seek an in-person evaluation if the symptoms worsen or if the condition fails to improve as anticipated.    Georgana Curio, FNP

## 2023-01-19 NOTE — Patient Instructions (Signed)
Urinary Tract Infection, Adult  A urinary tract infection (UTI) is an infection of any part of the urinary tract. The urinary tract includes the kidneys, ureters, bladder, and urethra. These organs make, store, and get rid of urine in the body. An upper UTI affects the ureters and kidneys. A lower UTI affects the bladder and urethra. What are the causes? Most urinary tract infections are caused by bacteria in your genital area around your urethra, where urine leaves your body. These bacteria grow and cause inflammation of your urinary tract. What increases the risk? You are more likely to develop this condition if: You have a urinary catheter that stays in place. You are not able to control when you urinate or have a bowel movement (incontinence). You are female and you: Use a spermicide or diaphragm for birth control. Have low estrogen levels. Are pregnant. You have certain genes that increase your risk. You are sexually active. You take antibiotic medicines. You have a condition that causes your flow of urine to slow down, such as: An enlarged prostate, if you are female. Blockage in your urethra. A kidney stone. A nerve condition that affects your bladder control (neurogenic bladder). Not getting enough to drink, or not urinating often. You have certain medical conditions, such as: Diabetes. A weak disease-fighting system (immunesystem). Sickle cell disease. Gout. Spinal cord injury. What are the signs or symptoms? Symptoms of this condition include: Needing to urinate right away (urgency). Frequent urination. This may include small amounts of urine each time you urinate. Pain or burning with urination. Blood in the urine. Urine that smells bad or unusual. Trouble urinating. Cloudy urine. Vaginal discharge, if you are female. Pain in the abdomen or the lower back. You may also have: Vomiting or a decreased appetite. Confusion. Irritability or tiredness. A fever or  chills. Diarrhea. The first symptom in older adults may be confusion. In some cases, they may not have any symptoms until the infection has worsened. How is this diagnosed? This condition is diagnosed based on your medical history and a physical exam. You may also have other tests, including: Urine tests. Blood tests. Tests for STIs (sexually transmitted infections). If you have had more than one UTI, a cystoscopy or imaging studies may be done to determine the cause of the infections. How is this treated? Treatment for this condition includes: Antibiotic medicine. Over-the-counter medicines to treat discomfort. Drinking enough water to stay hydrated. If you have frequent infections or have other conditions such as a kidney stone, you may need to see a health care provider who specializes in the urinary tract (urologist). In rare cases, urinary tract infections can cause sepsis. Sepsis is a life-threatening condition that occurs when the body responds to an infection. Sepsis is treated in the hospital with IV antibiotics, fluids, and other medicines. Follow these instructions at home:  Medicines Take over-the-counter and prescription medicines only as told by your health care provider. If you were prescribed an antibiotic medicine, take it as told by your health care provider. Do not stop using the antibiotic even if you start to feel better. General instructions Make sure you: Empty your bladder often and completely. Do not hold urine for long periods of time. Empty your bladder after sex. Wipe from front to back after urinating or having a bowel movement if you are female. Use each tissue only one time when you wipe. Drink enough fluid to keep your urine pale yellow. Keep all follow-up visits. This is important. Contact a health   care provider if: Your symptoms do not get better after 1-2 days. Your symptoms go away and then return. Get help right away if: You have severe pain in  your back or your lower abdomen. You have a fever or chills. You have nausea or vomiting. Summary A urinary tract infection (UTI) is an infection of any part of the urinary tract, which includes the kidneys, ureters, bladder, and urethra. Most urinary tract infections are caused by bacteria in your genital area. Treatment for this condition often includes antibiotic medicines. If you were prescribed an antibiotic medicine, take it as told by your health care provider. Do not stop using the antibiotic even if you start to feel better. Keep all follow-up visits. This is important. This information is not intended to replace advice given to you by your health care provider. Make sure you discuss any questions you have with your health care provider. Document Revised: 09/05/2019 Document Reviewed: 09/10/2019 Elsevier Patient Education  2024 Elsevier Inc.  

## 2023-01-20 NOTE — Telephone Encounter (Signed)
Please review.  KP

## 2023-01-21 ENCOUNTER — Other Ambulatory Visit: Payer: Self-pay

## 2023-01-21 DIAGNOSIS — R21 Rash and other nonspecific skin eruption: Secondary | ICD-10-CM

## 2023-01-21 DIAGNOSIS — L309 Dermatitis, unspecified: Secondary | ICD-10-CM

## 2023-01-21 NOTE — Telephone Encounter (Signed)
Referral sent     KP 

## 2023-02-06 ENCOUNTER — Telehealth: Payer: Self-pay | Admitting: Primary Care

## 2023-02-06 NOTE — Telephone Encounter (Signed)
I have spoke with Amy with [facility] and she is going to try and fax the information needed for the insurance again

## 2023-02-06 NOTE — Telephone Encounter (Signed)
Patient has [facility] and they needed additional information to help approve the patient to have in lab sleep study at the patients request. I spoke with Denise Walker with [facility] trying to answer the questions that she had.  No more new information was provided other then the patient stated she can't do home sleep study and needed to have in lab sleep study. Denise Walker was forwarding to Medical Reviewer. We will wait their decision.

## 2023-02-06 NOTE — Telephone Encounter (Signed)
Amy from [facility] is returning a call in reference to this patient. [phone]

## 2023-02-10 ENCOUNTER — Telehealth: Payer: Self-pay | Admitting: Primary Care

## 2023-02-10 DIAGNOSIS — R5382 Chronic fatigue, unspecified: Secondary | ICD-10-CM

## 2023-02-10 DIAGNOSIS — G4719 Other hypersomnia: Secondary | ICD-10-CM

## 2023-02-10 NOTE — Telephone Encounter (Signed)
Denise Walker you saw this patient on 11/18/22 and order home sleep study. Patient picked up HST and wasn't able to do sleep study at home due to children disrupting her sleep. Patient decided she would be better off doing in lab sleep study. We can letter from [facility] that they have denied in lab sleep study due to no co morbidities. I called the patient and explained to her the insurance reason for denial. Denise Walker was wondering since the only issue she has is fatigue would Denise Walker be willing to place a referral to Endocrinology to have her hormones checked

## 2023-02-11 NOTE — Telephone Encounter (Signed)
I would recommend she discuss this with her PCP, he can probably order the labs she needs, but I can place referral

## 2023-02-13 NOTE — Telephone Encounter (Signed)
 I called and spoke with pt in regards to the sleep study and Endocrinology. Beth, NP did send a referral for Endocrinology and I explained to the pt that anything further would need to be discussed with PCP. Pt verbally stated she did not want to do a sleep study due to her not thinking she had OSA, and only wanted to see Endocrinology in regards to fatigue. Pt verbalized understanding. Nothing further needed.

## 2023-02-14 NOTE — Telephone Encounter (Addendum)
 Called [facility] and spoke with Margarie Mcguirt.  Izell Labat states patients insurance denied the in lab sleep study and is requesting a home sleep test.   Per recent telephone note from 02/12/2022, patient does NOT want a home sleep test and getting a referral to endocrinology from [facility]. NFN and will close this encounter.

## 2023-02-26 ENCOUNTER — Telehealth: Payer: Self-pay | Admitting: Family Medicine

## 2023-02-26 ENCOUNTER — Ambulatory Visit: Payer: Medicaid Other | Admitting: Family Medicine

## 2023-02-26 NOTE — Telephone Encounter (Signed)
 FYI

## 2023-02-26 NOTE — Telephone Encounter (Signed)
 Copied from CRM [id number]. Topic: General - Other >> Feb 26, 2023 11:17 AM Lorrane Rosette wrote: Reason for CRM: Pt requested that a message be sent to Dr. Augustus Ledger apologizing for missing her appt today. Pt stated that she is not feeling well and she will call back to reschedule the appt.

## 2023-02-27 ENCOUNTER — Other Ambulatory Visit: Payer: Self-pay

## 2023-02-27 MED ORDER — SEMAGLUTIDE-WEIGHT MANAGEMENT 1 MG/0.5ML ~~LOC~~ SOAJ
1.0000 mg | SUBCUTANEOUS | 0 refills | Status: DC
  Filled 2023-02-27: qty 2, 28d supply, fill #0

## 2023-03-06 ENCOUNTER — Encounter: Payer: Self-pay | Admitting: Family Medicine

## 2023-03-06 ENCOUNTER — Ambulatory Visit: Payer: Medicaid Other | Admitting: Family Medicine

## 2023-03-06 VITALS — BP 126/80 | HR 84 | Ht 59.0 in | Wt 219.4 lb

## 2023-03-06 DIAGNOSIS — Z7689 Persons encountering health services in other specified circumstances: Secondary | ICD-10-CM

## 2023-03-06 DIAGNOSIS — M940 Chondrocostal junction syndrome [Tietze]: Secondary | ICD-10-CM | POA: Insufficient documentation

## 2023-03-06 MED ORDER — BENZONATATE 100 MG PO CAPS: 100.0000 mg | ORAL_CAPSULE | Freq: Three times a day (TID) | ORAL | 0 refills | Status: DC | PRN

## 2023-03-06 NOTE — Progress Notes (Signed)
     Primary Care / Sports Medicine Office Visit  Patient Information:  Patient ID: RUA LUCUS, female DOB: [birth year] Age: 33 y.o. MRN: [id number]   Denise Walker is a pleasant 33 y.o. female presenting with the following:  Chief Complaint  Patient presents with   Cough     Patient had a cold last week , started getting better. Symptoms came back Tuesday. Dry Cough and sneezing with chest pain.     Vitals:   03/06/23 1321  BP: 126/80  Pulse: 84  SpO2: 96%   Vitals:   03/06/23 1321  Weight: 219 lb 6.4 oz (99.5 kg)  Height: 4\' 11"  (1.499 m)   Body mass index is 44.31 kg/m.  No results found.   Independent interpretation of notes and tests performed by another provider:   None  Procedures performed:   None  Pertinent History, Exam, Impression, and Recommendations:   Problem List Items Addressed This Visit     Costochondritis - Primary   The patient, with a history of upper respiratory symptoms, presents with sharp chest pain associated with coughing and sneezing. The pain is located centrally at the sternum and is exacerbated by deep breaths. The patient also reports a general discomfort and achiness in the same area. Additionally, the patient has been experiencing pain in the armpit area, which is aggravated by wearing a bra and applying deodorant. The patient reports feeling better when sitting up rather than lying down. The patient has not tried any treatments for the pain.  Costochondritis Acute onset of sharp, reproducible chest pain at the costochondral junction of the sternum, exacerbated by deep breathing, coughing, and sneezing. Pain also noted in the underarm, likely related to muscle strain (serratus anterior). -Recommend over-the-counter Tylenol or Motrin as needed for pain. -Advise use of Voltaren gel, a topical NSAID, for localized pain relief. -Encourage gentle stretching and gradual increase in activity to promote muscle recovery.      Encounter  for weight management   The patient has been on [facility] for weight loss but paused the medication due to the recent illness. The patient reports that the medication was starting to show effects, with nausea not lasting as long and weight loss being noticed. The patient plans to restart the medication once she recovers fully from the illness.  Weight Management Patient on FIEPPI for weight loss, temporarily discontinued due to illness. -Advise patient to resume Wegovy when feeling better. -If nausea persists, patient to message doctor for possible dose adjustment.        Orders & Medications Medications:  Meds ordered this encounter  Medications   benzonatate (TESSALON PERLES) 100 MG capsule    Sig: Take 1 capsule (100 mg total) by mouth 3 (three) times daily as needed for cough.    Dispense:  20 capsule    Refill:  0   No orders of the defined types were placed in this encounter.    Return in about 3 months (around 06/04/2023) for Weight MGMT f/u.     Jerrol Banana, MD, [facility]   Primary Care Sports Medicine Primary Care and Sports Medicine at [facility]

## 2023-03-06 NOTE — Assessment & Plan Note (Signed)
The patient, with a history of upper respiratory symptoms, presents with sharp chest pain associated with coughing and sneezing. The pain is located centrally at the sternum and is exacerbated by deep breaths. The patient also reports a general discomfort and achiness in the same area. Additionally, the patient has been experiencing pain in the armpit area, which is aggravated by wearing a bra and applying deodorant. The patient reports feeling better when sitting up rather than lying down. The patient has not tried any treatments for the pain.  Costochondritis Acute onset of sharp, reproducible chest pain at the costochondral junction of the sternum, exacerbated by deep breathing, coughing, and sneezing. Pain also noted in the underarm, likely related to muscle strain (serratus anterior). -Recommend over-the-counter Tylenol or Motrin as needed for pain. -Advise use of Voltaren gel, a topical NSAID, for localized pain relief. -Encourage gentle stretching and gradual increase in activity to promote muscle recovery.

## 2023-03-06 NOTE — Assessment & Plan Note (Signed)
The patient has been on [facility] for weight loss but paused the medication due to the recent illness. The patient reports that the medication was starting to show effects, with nausea not lasting as long and weight loss being noticed. The patient plans to restart the medication once she recovers fully from the illness.  Weight Management Patient on ZOXWRU for weight loss, temporarily discontinued due to illness. -Advise patient to resume Wegovy when feeling better. -If nausea persists, patient to message doctor for possible dose adjustment.

## 2023-03-06 NOTE — Patient Instructions (Signed)
Costochondritis Management  - Pain Relief: Use Tylenol or Motrin as needed. Apply Voltaren gel to the painful areas. - Activity: Engage in gentle stretching and slowly increase activity to aid recovery.  Weight Management with QMVHQI  - Resuming Medication: Start Wegovy again once you feel better. - Side Effects: Contact office if nausea severe after restarting Wegovy at 1 mg for a possible dose adjustment.

## 2023-03-14 ENCOUNTER — Telehealth: Payer: Self-pay | Admitting: Primary Care

## 2023-03-14 DIAGNOSIS — G4719 Other hypersomnia: Secondary | ICD-10-CM

## 2023-03-14 NOTE — Telephone Encounter (Signed)
Order was placed on 02/01/23 for [facility] not sure why she hasn't been contacted yet. I have now faxed the order for them to actually see the referral

## 2023-03-14 NOTE — Telephone Encounter (Signed)
Spoke to patient she is not interested in the home sleep study. She is waiting on endocrine for an appointment. Referral was placed last month.

## 2023-04-15 ENCOUNTER — Ambulatory Visit: Payer: Self-pay | Admitting: Family Medicine

## 2023-04-15 ENCOUNTER — Other Ambulatory Visit: Payer: Self-pay

## 2023-04-15 ENCOUNTER — Emergency Department
Admission: EM | Admit: 2023-04-15 | Discharge: 2023-04-16 | Disposition: A | Discharge status: Home / Self Care | Attending: Emergency Medicine | Admitting: Emergency Medicine

## 2023-04-15 ENCOUNTER — Ambulatory Visit: Admitting: Family Medicine

## 2023-04-15 ENCOUNTER — Encounter: Payer: Self-pay | Admitting: Family Medicine

## 2023-04-15 VITALS — BP 106/80 | HR 114 | Ht 59.0 in | Wt 216.6 lb

## 2023-04-15 DIAGNOSIS — R112 Nausea with vomiting, unspecified: Secondary | ICD-10-CM | POA: Diagnosis not present

## 2023-04-15 DIAGNOSIS — K5900 Constipation, unspecified: Secondary | ICD-10-CM | POA: Diagnosis not present

## 2023-04-15 DIAGNOSIS — K5730 Diverticulosis of large intestine without perforation or abscess without bleeding: Secondary | ICD-10-CM | POA: Diagnosis not present

## 2023-04-15 DIAGNOSIS — R1110 Vomiting, unspecified: Secondary | ICD-10-CM | POA: Diagnosis not present

## 2023-04-15 HISTORY — DX: Nausea with vomiting, unspecified: R11.2

## 2023-04-15 MED ORDER — ONDANSETRON 4 MG PO TBDP
4.0000 mg | ORAL_TABLET | Freq: Once | ORAL | Status: AC
  Administered 2023-04-15: 4 mg via ORAL
  Filled 2023-04-15: qty 1

## 2023-04-15 MED ORDER — ONDANSETRON 8 MG PO TBDP: 8.0000 mg | ORAL_TABLET | Freq: Three times a day (TID) | ORAL | 1 refills | Status: DC | PRN

## 2023-04-15 NOTE — Assessment & Plan Note (Signed)
 History of Present Illness Denise Walker is a 33 year old female who presents with severe nausea and vomiting after starting a new dose of Wegovy.  She began experiencing severe nausea and vomiting yesterday morning, shortly after waking up. She had taken a new dose of Wegovy, specifically the 1 mg dose, on Sunday night. This is the first time she has been on this dosage.  The nausea is intense, and she has been unable to keep any food or liquids down since Sunday. Even plain white rice caused vomiting immediately after consumption. She describes episodes of dry heaving, where her body continues to retch even when nothing is left to expel. She feels extremely run down, attributing this to the lack of intake and continuous vomiting. Any liquid, including ginger ale, water, and electrolyte solutions, is vomited shortly after ingestion.  She has not had a bowel movement since starting the new dose, but notes that her usual pattern is every other day with the aid of an OTC fiber powder. Denies overt fevers or chills.  Her lips and mouth are dry. No one else in her household is experiencing similar symptoms.  Physical Exam VITALS: P- 114 HEENT: Oral mucosa tacky. CHEST: No distention, soft, absent bowel sounds, no hepato-splenomegaly, nontender throughout, no rebound CARDIOVASCULAR: Positive S1-S2, tachycardic rate, regular rhythm, no additional heart sounds.  Assessment and Plan Nausea and Vomiting secondary to [facility] Acute severe nausea and vomiting due to Wegovy's effect on slowing gastrointestinal motility, leading to dehydration and tachycardia. No infection or viral illness detected. - Discontinue Wegovy temporarily. - Prescribe Zofran as needed for nausea, up to three times daily. - Advise hydration with clear liquids like Pedialyte and Gatorade. - Instruct to seek emergency care if unable to retain fluids despite Zofran. - Advise avoiding solid foods until nausea improves. - Plan to  restart Wegovy at 0.5 mg once symptoms resolve and bowel movements are regular.  Dehydration Dehydration from persistent vomiting and inability to retain fluids, causing dry mouth and tachycardia. - Encourage oral rehydration with clear fluids. - Advise seeking emergency care if unable to maintain hydration.  Constipation Constipation due to [facility] slowing gastrointestinal motility. No bowel movements since starting 1 mg Wegovy. - Use enemas or suppositories to relieve constipation. - Continue fiber supplements. - Consider Miralax or magnesium if needed for bowel regularity. See list of steps.

## 2023-04-15 NOTE — Telephone Encounter (Signed)
 Scheduled patient for today

## 2023-04-15 NOTE — ED Triage Notes (Addendum)
 Pt reports she inc her dose of wegovy on Sunday, pt saw pcp earlier today for vomiting and was told this was due to her increasing her dose of medication. Pt reports she was unable to fill prescription for zofran tonight and has continued to have nausea. Pt had labs drawn at pcp

## 2023-04-15 NOTE — Patient Instructions (Addendum)
 Patient Plan  Nausea and Vomiting: 1. Stop taking Wegovy temporarily. 2. Take Zofran for nausea, up to three times daily as needed. 3. Drink clear liquids like Pedialyte and Gatorade to stay hydrated. 4. Avoid solid foods until nausea improves. 5. If unable to tolerate liquid despite the Zofran, go to the ER  Dehydration: 1. Drink clear fluids regularly to prevent dehydration. 2. Seek emergency care if you cannot keep fluids down despite taking Zofran.  Constipation: 1. Use enemas or suppositories if needed to relieve constipation. 2. Continue taking fiber supplements. 3. Consider using Miralax or magnesium if additional help is needed for regular bowel movements.  Red Flags: - Seek emergency care if you are unable to retain fluids or if symptoms worsen despite treatment.  Stepwise Evidence-Based OTC Treatment Plan for Constipation Find a consistent over-the-counter regimen that allows for at least 1-2 smooth, formed, bowel movements daily.  Step 1: Lifestyle & Bulk-Forming Agents (First-Line Therapy)  ? Fiber Supplementation:  Psyllium (Metamucil): 3-6g PO 1-3x daily with >=8 oz water (strongest evidence for efficacy per ACG guidelines).   Methylcellulose (Citrucel): 4-6g PO daily as an alternative.    ? Hydration & Activity:   >=2L/day of fluids and regular physical activity.    ? Expected Onset: 12-72 hours.    Step 2: Osmotic Laxatives (First-Line Pharmacologic Therapy)  ? Polyethylene Glycol (PEG, MiraLAX) - Strongest Evidence   17g daily (may increase to twice daily as-needed).   Recommended by ACG & AGA as most effective OTC option.    ? Magnesium-Based Laxatives (For Rapid Relief) Magnesium hydroxide (Milk of Magnesia): 30-60 mL daily.   Magnesium citrate: 150-300 mL once as-needed for immediate relief.   Avoid if there is known chronic kidney disease.    ? Expected Onset: 12-72 hours for PEG, 30 min-6 hours for magnesium.    Step 3: Stimulant Laxatives (If No  Response in 2-3 Days)  ? Senna (Senokot, Ex-Lax) - Evidence-Based for Chronic Constipation   8.6-17.2 mg at bedtime, max 34.4 mg/day.    ? Bisacodyl (Dulcolax) - Potent Stimulant  5-10 mg at bedtime (oral) or   10 mg PR (suppository) for rapid relief.    ? Expected Onset: 12-72 hours.   6-12 hours (oral), 15-60 min (suppository).    Step 4: Rectal Therapies (For Immediate Relief)  ? Suppositories Bisacodyl (Dulcolax) 10 mg PR (stimulant, works in 15-60 min).   Glycerin suppository PR (safe for all ages, works in 15-30 min).    ? Enemas Saline enema (Fleet): 120 mL PR once (works in 1-5 min).   Mineral oil enema: 120 mL PR (lubricates stool).    ?? Red Flags Requiring Contact to Physician   - No response to OTC therapy >7 days.   - Weight loss, rectal bleeding, anemia, or severe pain.

## 2023-04-15 NOTE — Progress Notes (Signed)
 Primary Care / Sports Medicine Office Visit  Patient Information:  Patient ID: Denise Walker, female DOB: 19-Jul-1990 Age: 33 y.o. MRN: [id number]   Denise Walker is a pleasant 33 y.o. female presenting with the following:  Chief Complaint  Patient presents with   Emesis    Patient presents today with vomiting since yesterday morning. She took her 1mg  of wegovy Sunday night. She has no energy, can't get worm, and lips sticking to teeth. She is concerned she is getting dehydrated.     Vitals:   04/15/23 1526  BP: 106/80  Pulse: (!) 114  SpO2: 96%   Vitals:   04/15/23 1526  Weight: 216 lb 9.6 oz (98.2 kg)  Height: 4\' 11"  (1.499 m)   Body mass index is 43.75 kg/m.  No results found.   Independent interpretation of notes and tests performed by another provider:   None  Procedures performed:   None  Pertinent History, Exam, Impression, and Recommendations:   Problem List Items Addressed This Visit     Nausea and vomiting - Primary   History of Present Illness Denise Walker is a 33 year old female who presents with severe nausea and vomiting after starting a new dose of Wegovy.  She began experiencing severe nausea and vomiting yesterday morning, shortly after waking up. She had taken a new dose of Wegovy, specifically the 1 mg dose, on Sunday night. This is the first time she has been on this dosage.  The nausea is intense, and she has been unable to keep any food or liquids down since Sunday. Even plain white rice caused vomiting immediately after consumption. She describes episodes of dry heaving, where her body continues to retch even when nothing is left to expel. She feels extremely run down, attributing this to the lack of intake and continuous vomiting. Any liquid, including ginger ale, water, and electrolyte solutions, is vomited shortly after ingestion.  She has not had a bowel movement since starting the new dose, but notes that her usual pattern is  every other day with the aid of an OTC fiber powder. Denies overt fevers or chills.  Her lips and mouth are dry. No one else in her household is experiencing similar symptoms.  Physical Exam VITALS: P- 114 HEENT: Oral mucosa tacky. CHEST: No distention, soft, absent bowel sounds, no hepato-splenomegaly, nontender throughout, no rebound CARDIOVASCULAR: Positive S1-S2, tachycardic rate, regular rhythm, no additional heart sounds.  Assessment and Plan Nausea and Vomiting secondary to [facility] Acute severe nausea and vomiting due to Wegovy's effect on slowing gastrointestinal motility, leading to dehydration and tachycardia. No infection or viral illness detected. - Discontinue Wegovy temporarily. - Prescribe Zofran as needed for nausea, up to three times daily. - Advise hydration with clear liquids like Pedialyte and Gatorade. - Instruct to seek emergency care if unable to retain fluids despite Zofran. - Advise avoiding solid foods until nausea improves. - Plan to restart Wegovy at 0.5 mg once symptoms resolve and bowel movements are regular.  Dehydration Dehydration from persistent vomiting and inability to retain fluids, causing dry mouth and tachycardia. - Encourage oral rehydration with clear fluids. - Advise seeking emergency care if unable to maintain hydration.  Constipation Constipation due to [facility] slowing gastrointestinal motility. No bowel movements since starting 1 mg Wegovy. - Use enemas or suppositories to relieve constipation. - Continue fiber supplements. - Consider Miralax or magnesium if needed for bowel regularity. See list of steps.  Orders & Medications Medications:  Meds ordered this encounter  Medications   ondansetron (ZOFRAN-ODT) 8 MG disintegrating tablet    Sig: Take 1 tablet (8 mg total) by mouth every 8 (eight) hours as needed for nausea.    Dispense:  20 tablet    Refill:  1   No orders of the defined types were placed in this encounter.     No follow-ups on file.     Jerrol Banana, MD, [facility]   Primary Care Sports Medicine Primary Care and Sports Medicine at [facility]

## 2023-04-15 NOTE — Telephone Encounter (Signed)
 Chief Complaint: vomiting Symptoms: headache, vomiting Frequency: x 1 day Pertinent Negatives: Patient denies dizziness, fever, blood in vomit. Disposition: [] ED /[] Urgent Care (no appt availability in office) / [] Appointment(In office/virtual)/ []  [facility]/ [] Home Care/ [] Refused Recommended Disposition /[] [facility]/ [x]  Follow-up with PCP Additional Notes: Patient c/o she took her semaglutide injection on Sunday and woke up with vomiting since yesterday morning. Patient states she has been taking dramamine OTC for nausea/vomiting and it does not help. Patient states yesterday afternoon she tried to eat some plain white rice and she immediatly vomited. Patient states she has been dry heaving as well. Patient states she was able to make urine this morning but feels dehydrated. Called [facility] to ask if patient can come in today for appointment with PCP or if ED is advised due to disposition. Instructed to send high priority CRM to the clinical. Patient asked for a call back around 11:30am due to she is with her child at the dentist.  Copied from CRM [id number]. Topic: Clinical - Medical Advice >> Apr 15, 2023  9:40 AM Higinio Roger wrote: Reason for CRM: Patient is requesting to speak with Dr. Ashley Royalty nurse. She is wanting to know what she should do. She keeps vomiting, can't keep food or liquids down. Dehydrated and headaches. Callback #: [phone] Reason for Disposition  [1] SEVERE vomiting (e.g., 6 or more times/day) AND [2] present > 8 hours (Exception: Patient sounds well, is drinking liquids, does not sound dehydrated, and vomiting has lasted less than 24 hours.)  Answer Assessment - Initial Assessment Questions 1. VOMITING SEVERITY: "How many times have you vomited in the past 24 hours?"     - MILD:  1 - 2 times/day    - MODERATE: 3 - 5 times/day, decreased oral intake without significant weight loss or symptoms of dehydration    - SEVERE: 6 or more times/day, vomits  everything or nearly everything, with significant weight loss, symptoms of dehydration      "Well over a dozen, including the dry heaving".  2. ONSET: "When did the vomiting begin?"      Patient states she woke up yesterday morning with vomiting.  3. FLUIDS: "What fluids or food have you vomited up today?" "Have you been able to keep any fluids down?"     Patient states she has thrown up way more than she could get in, states it is just liquid and sometimes it is nothing just dry heaves.  4. ABDOMEN PAIN: "Are your having any abdomen pain?" If Yes : "How bad is it and what does it feel like?" (e.g., crampy, dull, intermittent, constant)      "Soreness from the retching", 2/10.  5. DIARRHEA: "Is there any diarrhea?" If Yes, ask: "How many times today?"      Denies.  6. CONTACTS: "Is there anyone else in the family with the same symptoms?"      Denies.  7. CAUSE: "What do you think is causing your vomiting?"     Semaglutide injection Sunday night.  8. HYDRATION STATUS: "Any signs of dehydration?" (e.g., dry mouth [not only dry lips], too weak to stand) "When did you last urinate?"     This morning was the last time she was able to urinate, "very little".  9. OTHER SYMPTOMS: "Do you have any other symptoms?" (e.g., fever, headache, vertigo, vomiting blood or coffee grounds, recent head injury)     Headache.  10. PREGNANCY: "Is there any chance you are pregnant?" "When was  your last menstrual period?"       LMP last week.  Protocols used: Vomiting-A-AH

## 2023-04-16 ENCOUNTER — Encounter: Payer: Self-pay | Admitting: Family Medicine

## 2023-04-16 ENCOUNTER — Emergency Department

## 2023-04-16 DIAGNOSIS — K5730 Diverticulosis of large intestine without perforation or abscess without bleeding: Secondary | ICD-10-CM | POA: Diagnosis not present

## 2023-04-16 DIAGNOSIS — K5900 Constipation, unspecified: Secondary | ICD-10-CM | POA: Diagnosis not present

## 2023-04-16 DIAGNOSIS — R1110 Vomiting, unspecified: Secondary | ICD-10-CM | POA: Diagnosis not present

## 2023-04-16 MED ORDER — KETOROLAC TROMETHAMINE 15 MG/ML IJ SOLN
15.0000 mg | Freq: Once | INTRAMUSCULAR | Status: AC
  Administered 2023-04-16: 15 mg via INTRAMUSCULAR
  Filled 2023-04-16: qty 1

## 2023-04-16 MED ORDER — ONDANSETRON 8 MG PO TBDP
8.0000 mg | ORAL_TABLET | Freq: Once | ORAL | Status: AC
Start: 2023-04-16 — End: 2023-04-16
  Administered 2023-04-16: 8 mg via ORAL
  Filled 2023-04-16: qty 1

## 2023-04-16 NOTE — ED Notes (Signed)
 Pt refused pregnancy test.   Pt states she is not sexually active.  Xray tech aware.

## 2023-04-16 NOTE — ED Provider Notes (Signed)
 [name] Provider Note    Event Date/Time   First MD Initiated Contact with Patient 04/15/23 2330     (approximate)   History   Chief Complaint: Emesis   HPI  Denise Walker is a 33 y.o. female with a history of PCOS, anxiety, obesity on Wegovy who comes ED due to nausea and vomiting starting 2 days ago after escalating her [facility] dosage.  Also reports constipation and lack of flatus over the last 2 days.  No dizziness chest pain shortness of breath.  No fever.  Was prescribed Zofran by her doctor but was unable to obtain it before tonight when her nausea and vomiting became more severe.          Physical Exam   Triage Vital Signs: ED Triage Vitals  Encounter Vitals Group     BP 04/15/23 2118 (!) 140/91     Systolic BP Percentile --      Diastolic BP Percentile --      Pulse Rate 04/15/23 2118 (!) 110     Resp 04/15/23 2118 18     Temp 04/15/23 2118 98.3 F (36.8 C)     Temp src --      SpO2 04/15/23 2118 98 %     Weight 04/15/23 2117 216 lb (98 kg)     Height 04/15/23 2117 4\' 11"  (1.499 m)     Head Circumference --      Peak Flow --      Pain Score 04/15/23 2117 0     Pain Loc --      Pain Education --      Exclude from Growth Chart --     Most recent vital signs: Vitals:   04/15/23 2118  BP: (!) 140/91  Pulse: (!) 110  Resp: 18  Temp: 98.3 F (36.8 C)  SpO2: 98%    General: Awake, no distress.  CV:  Good peripheral perfusion.  Regular rate and rhythm, heart rate 90 Resp:  Normal effort.  Clear to auscultation bilaterally Abd:  No distention.  Soft, mild generalized tenderness. Other:  No lower extremity edema   ED Results / Procedures / Treatments   Labs (all labs ordered are listed, but only abnormal results are displayed) Labs Reviewed - No data to display   EKG    RADIOLOGY X-ray KUB interpreted by me, appears normal.  Radiology report reviewed   PROCEDURES:  Procedures   MEDICATIONS ORDERED IN  ED: Medications  ondansetron (ZOFRAN-ODT) disintegrating tablet 4 mg (4 mg Oral Given 04/15/23 2119)  ketorolac (TORADOL) 15 MG/ML injection 15 mg (15 mg Intramuscular Given 04/16/23 0006)  ondansetron (ZOFRAN-ODT) disintegrating tablet 8 mg (8 mg Oral Given 04/16/23 0105)     IMPRESSION / MDM / ASSESSMENT AND PLAN / ED COURSE  I reviewed the triage vital signs and the nursing notes.  DDx: Gastritis/GERD, Reginal Lutes gastroparesis, dehydration, bowel obstruction  Patient's presentation is most consistent with acute presentation with potential threat to life or bodily function.  Patient presents with nausea and vomiting in the setting of increased dosage of Wegovy.  After receiving oral Zofran in the ED, she reports symptoms are much better and she is tolerating oral intake.  Offered IV hydration, she prefers oral rehydration.    Clinical Course as of 04/16/23 0629  Wed Apr 16, 2023  0059 KUB normal. No signs of SBO. Tolerating PO and stable for DC. [PS]    Clinical Course User Index [PS] Sharman Cheek, MD  FINAL CLINICAL IMPRESSION(S) / ED DIAGNOSES   Final diagnoses:  Nausea and vomiting, unspecified vomiting type     Rx / DC Orders   ED Discharge Orders     None        Note:  This document was prepared using Dragon voice recognition software and may include unintentional dictation errors.   Sharman Cheek, MD 04/16/23 [phone]

## 2023-04-16 NOTE — Discharge Instructions (Signed)
 Your abdominal xray is normal. Continue taking zofran as needed and follow up with your doctor.

## 2023-04-20 ENCOUNTER — Emergency Department
Admission: EM | Admit: 2023-04-20 | Discharge: 2023-04-20 | Disposition: A | Discharge status: Home / Self Care | Attending: Emergency Medicine | Admitting: Emergency Medicine

## 2023-04-20 ENCOUNTER — Other Ambulatory Visit: Payer: Self-pay

## 2023-04-20 ENCOUNTER — Encounter: Payer: Self-pay | Admitting: Emergency Medicine

## 2023-04-20 ENCOUNTER — Telehealth: Admitting: Physician Assistant

## 2023-04-20 DIAGNOSIS — E860 Dehydration: Secondary | ICD-10-CM | POA: Diagnosis not present

## 2023-04-20 DIAGNOSIS — K5900 Constipation, unspecified: Secondary | ICD-10-CM

## 2023-04-20 DIAGNOSIS — K5901 Slow transit constipation: Secondary | ICD-10-CM | POA: Diagnosis not present

## 2023-04-20 LAB — CBC
HCT: 39.8 % (ref 36.0–46.0)
Hemoglobin: 12.8 g/dL (ref 12.0–15.0)
MCH: 29.7 pg (ref 26.0–34.0)
MCHC: 32.2 g/dL (ref 30.0–36.0)
MCV: 92.3 fL (ref 80.0–100.0)
Platelets: 259 10*3/uL (ref 150–400)
RBC: 4.31 MIL/uL (ref 3.87–5.11)
RDW: 14.5 % (ref 11.5–15.5)
WBC: 6.9 10*3/uL (ref 4.0–10.5)
nRBC: 0 % (ref 0.0–0.2)

## 2023-04-20 LAB — COMPREHENSIVE METABOLIC PANEL
ALT: 16 U/L (ref 0–44)
AST: 22 U/L (ref 15–41)
Albumin: 4.5 g/dL (ref 3.5–5.0)
Alkaline Phosphatase: 53 U/L (ref 38–126)
Anion gap: 8 (ref 5–15)
BUN: 8 mg/dL (ref 6–20)
CO2: 25 mmol/L (ref 22–32)
Calcium: 8.6 mg/dL — ABNORMAL LOW (ref 8.9–10.3)
Chloride: 104 mmol/L (ref 98–111)
Creatinine, Ser: 1.06 mg/dL — ABNORMAL HIGH (ref 0.44–1.00)
GFR, Estimated: >60 mL/min (ref >60)
Glucose, Bld: 101 mg/dL — ABNORMAL HIGH (ref 70–99)
Potassium: 3.4 mmol/L — ABNORMAL LOW (ref 3.5–5.1)
Sodium: 137 mmol/L (ref 135–145)
Total Bilirubin: 0.9 mg/dL (ref 0.0–1.2)
Total Protein: 7.4 g/dL (ref 6.5–8.1)

## 2023-04-20 LAB — URINALYSIS, ROUTINE W REFLEX MICROSCOPIC
Bilirubin Urine: NEGATIVE
Glucose, UA: NEGATIVE mg/dL
Hgb urine dipstick: NEGATIVE
Ketones, ur: NEGATIVE mg/dL
Leukocytes,Ua: NEGATIVE
Nitrite: NEGATIVE
Protein, ur: NEGATIVE mg/dL
Specific Gravity, Urine: 1.002 — ABNORMAL LOW (ref 1.005–1.030)
pH: 7 (ref 5.0–8.0)

## 2023-04-20 LAB — LIPASE, BLOOD: Lipase: 44 U/L (ref 11–51)

## 2023-04-20 LAB — POC URINE PREG, ED: Preg Test, Ur: NEGATIVE

## 2023-04-20 MED ORDER — MAGNESIUM HYDROXIDE 400 MG/5ML PO SUSP
15.0000 mL | Freq: Every day | ORAL | Status: AC
  Administered 2023-04-20: 15 mL via ORAL
  Filled 2023-04-20: qty 30

## 2023-04-20 MED ORDER — SODIUM CHLORIDE 0.9 % IV BOLUS
500.0000 mL | Freq: Once | INTRAVENOUS | Status: AC
  Administered 2023-04-20: 500 mL via INTRAVENOUS

## 2023-04-20 MED ORDER — POLYETHYLENE GLYCOL 3350 17 G PO PACK
17.0000 g | PACK | Freq: Once | ORAL | Status: AC
  Administered 2023-04-20: 17 g via ORAL
  Filled 2023-04-20: qty 1

## 2023-04-20 NOTE — ED Notes (Signed)
 Pt attempted to have BM, unsuccessful. Pt endorses abd pain after medication administration.

## 2023-04-20 NOTE — Patient Instructions (Signed)
  Ranee Gosselin, thank you for joining [name], PA-C for today's virtual visit.  While this provider is not your primary care provider (PCP), if your PCP is located in our provider database this encounter information will be shared with them immediately following your visit.   A [facility] MyChart account gives you access to today's visit and all your visits, tests, and labs performed at [facility] " click here if you don't have a [facility] MyChart account or go to [email or web address]  Consent: (Patient) Denise Walker provided verbal consent for this virtual visit at the beginning of the encounter.  Current Medications:  Current Outpatient Medications:    Dextromethorphan-buPROPion ER (AUVELITY) 45-105 MG TBCR, Take 1 tablet by mouth in the morning and at bedtime., Disp: 180 tablet, Rfl: 3   fluticasone (FLONASE) 50 MCG/ACT nasal spray, Place 2 sprays into both nostrils daily., Disp: 16 g, Rfl: 0   levonorgestrel (MIRENA) 20 MCG/24HR IUD, by Intrauterine route., Disp: , Rfl:    omeprazole (PRILOSEC) 40 MG capsule, TAKE 1 CAPSULE (40 MG TOTAL) BY MOUTH DAILY., Disp: 90 capsule, Rfl: 0   ondansetron (ZOFRAN-ODT) 8 MG disintegrating tablet, Take 1 tablet (8 mg total) by mouth every 8 (eight) hours as needed for nausea., Disp: 20 tablet, Rfl: 1   Semaglutide-Weight Management 1 MG/0.5ML SOAJ, Inject 1 mg into the skin once a week., Disp: 2 mL, Rfl: 0   Semaglutide-Weight Management 1 MG/0.5ML SOAJ, Inject 1 mg into the skin once a week., Disp: 2 mL, Rfl: 0   Medications ordered in this encounter:  No orders of the defined types were placed in this encounter.    *If you need refills on other medications prior to your next appointment, please contact your pharmacy*  Follow-Up: Call back or seek an in-person evaluation if the symptoms worsen or if the condition fails to improve as anticipated.  [facility] [phone]  Other  Instructions Continue with Miralax, drink plenty of fluids.  If you are continuing with abdominal pain recommend emergency department visit.    If you have been instructed to have an in-person evaluation today at a local Urgent Care facility, please use the link below. It will take you to a list of all of our available [facility], including address, phone number and hours of operation. Please do not delay care.  [facility]  If you or a family member do not have a primary care provider, use the link below to schedule a visit and establish care. When you choose a [facility] primary care physician or advanced practice provider, you gain a long-term partner in health. Find a Primary Care Provider  Learn more about [facility]'s in-office and virtual care options:  - Get Care Now

## 2023-04-20 NOTE — Discharge Instructions (Signed)
 Have been diagnosed with dehydration, constipation.  Please take MiraLAX 2 caps in 8 ounces of glass of water, 3 times per day until you can pass stool.  Then you continue taking 1 cup of MiraLAX and 1 glass of water daily.  This take electrolytes with water to prevent dehydration.  Come back to ED or go to your PCP if you have new symptoms or symptoms worsen.

## 2023-04-20 NOTE — Progress Notes (Signed)
 Virtual Visit Consent   Denise Walker, you are scheduled for a virtual visit with a [facility] provider today. Just as with appointments in the office, your consent must be obtained to participate. Your consent will be active for this visit and any virtual visit you may have with one of our providers in the next 365 days. If you have a MyChart account, a copy of this consent can be sent to you electronically.  As this is a virtual visit, video technology does not allow for your provider to perform a traditional examination. This may limit your provider's ability to fully assess your condition. If your provider identifies any concerns that need to be evaluated in person or the need to arrange testing (such as labs, EKG, etc.), we will make arrangements to do so. Although advances in technology are sophisticated, we cannot ensure that it will always work on either your end or our end. If the connection with a video visit is poor, the visit may have to be switched to a telephone visit. With either a video or telephone visit, we are not always able to ensure that we have a secure connection.  By engaging in this virtual visit, you consent to the provision of healthcare and authorize for your insurance to be billed (if applicable) for the services provided during this visit. Depending on your insurance coverage, you may receive a charge related to this service.  I need to obtain your verbal consent now. Are you willing to proceed with your visit today? Denise Walker has provided verbal consent on 04/20/2023 for a virtual visit (video or telephone). Denise Denise Ward, PA-C  Date: 04/20/2023 3:33 PM   Virtual Visit via Video Note   I, Denise Walker, connected with  Denise Walker  ([id number], 07/18/1990) on 04/20/23 at  3:30 PM EDT by a video-enabled telemedicine application and verified that I am speaking with the correct person using two identifiers.  Location: Patient: Virtual Visit Location Patient:  Home Provider: Virtual Visit Location Provider: Home Office   I discussed the limitations of evaluation and management by telemedicine and the availability of in person appointments. The patient expressed understanding and agreed to proceed.    History of Present Illness: Denise Walker is a 33 y.o. who identifies as a female who was assigned female at birth, and is being seen today for constipation.  Pt reports last Sunday increased dose of Wegovy which caused n/v.  Was seen in the ED for n/v and give zofran which provided relief.  She has tried miralax, stool softeners, increased fluids, and increased fiber with no bowel movement.  Pt reports she is very uncomfortable, complains of abdominal pain. Denies current nausea or vomiting.    HPI: HPI  Problems:  Patient Active Problem List   Diagnosis Date Noted   Nausea and vomiting 04/15/2023   Costochondritis 03/06/2023   Chronic constipation 01/02/2023   Allergic rhinitis 01/02/2023   Excessive daytime sleepiness 11/18/2022   Vitamin D deficiency 11/18/2022   Gastroesophageal reflux disease without esophagitis 06/02/2022   Recurrent sinusitis 04/19/2022   Annual physical exam 03/21/2022   BMI 45.0-49.9, adult (HCC) 03/21/2022   Encounter for weight management 02/18/2022   Mixed hyperlipidemia 06/04/2021   Prediabetes 06/04/2021   PCOS (polycystic ovarian syndrome) 05/01/2021   Chronic pain of right knee 05/01/2021   Chronic foot pain, left 05/01/2021   Chronic post-traumatic stress disorder (PTSD) 12/21/2020   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   ADHD (attention deficit hyperactivity disorder), 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   High risk medication use 06/29/2020   History of ADHD 06/29/2020   Uterine scar from previous cesarean delivery affecting pregnancy 12/31/2018   BRCA gene mutation negative 05/04/2018   Rh  negative state in antepartum period 08/23/2017   Migraines 04/09/2017   Anxiety and depression 12/01/2014    Allergies:  Allergies  Allergen Reactions   Fish Allergy Shortness Of Breath   Shellfish Allergy Shortness Of Breath   Rizatriptan Other (See Comments)    "Head pressure, neck locking up" "Head pressure, neck locking up"   Trazodone And Nefazodone Other (See Comments)    Headache   Medications:  Current Outpatient Medications:    Dextromethorphan-buPROPion ER (AUVELITY) 45-105 MG TBCR, Take 1 tablet by mouth in the morning and at bedtime., Disp: 180 tablet, Rfl: 3   fluticasone (FLONASE) 50 MCG/ACT nasal spray, Place 2 sprays into both nostrils daily., Disp: 16 g, Rfl: 0   levonorgestrel (MIRENA) 20 MCG/24HR IUD, by Intrauterine route., Disp: , Rfl:    omeprazole (PRILOSEC) 40 MG capsule, TAKE 1 CAPSULE (40 MG TOTAL) BY MOUTH DAILY., Disp: 90 capsule, Rfl: 0   ondansetron (ZOFRAN-ODT) 8 MG disintegrating tablet, Take 1 tablet (8 mg total) by mouth every 8 (eight) hours as needed for nausea., Disp: 20 tablet, Rfl: 1   Semaglutide-Weight Management 1 MG/0.5ML SOAJ, Inject 1 mg into the skin once a week., Disp: 2 mL, Rfl: 0   Semaglutide-Weight Management 1 MG/0.5ML SOAJ, Inject 1 mg into the skin once a week., Disp: 2 mL, Rfl: 0  Observations/Objective: Patient is well-developed, well-nourished in no acute distress.  Resting comfortably  at home.  Head is normocephalic, atraumatic.  No labored breathing.  Speech is clear and coherent with logical content.  Patient is alert and oriented at baseline.    Assessment and Plan: 1. Constipation, unspecified constipation type (Primary)  Advised to continue miralax and push fluids.  If no improvement recommend ED follow up.   Follow Up Instructions: I discussed the assessment and treatment plan with the patient. The patient was provided an opportunity to ask questions and all were answered. The patient agreed with the plan and  demonstrated an understanding of the instructions.  A copy of instructions were sent to the patient via MyChart unless otherwise noted below.     The patient was advised to call back or seek an in-person evaluation if the symptoms worsen or if the condition fails to improve as anticipated.    Denise Denise Ward, PA-C

## 2023-04-20 NOTE — ED Provider Notes (Signed)
 [facility] Provider Note    Event Date/Time   First MD Initiated Contact with Patient 04/20/23 1819     (approximate)   History   Constipation   HPI  Denise Walker is a 33 y.o. female who presents today with history of 8 days of constipation.  Patient is taking Wegovy in the last 8 months,  her PCP increased the doses of 1 mg producing the side effects of constipation.  Patient suspended Wegovy this week.  PCP recommended Ex-Lax, fiber, MiraLAX, enemas without any results.     Physical Exam   Triage Vital Signs: ED Triage Vitals [04/20/23 1746]  Encounter Vitals Group     BP (!) 136/104     Systolic BP Percentile      Diastolic BP Percentile      Pulse Rate 87     Resp 18     Temp 98.5 F (36.9 C)     Temp Source Oral     SpO2 100 %     Weight 213 lb (96.6 kg)     Height 4\' 11"  (1.499 m)     Head Circumference      Peak Flow      Pain Score 5     Pain Loc      Pain Education      Exclude from Growth Chart     Most recent vital signs: Vitals:   04/20/23 1746  BP: (!) 136/104  Pulse: 87  Resp: 18  Temp: 98.5 F (36.9 C)  SpO2: 100%     Constitutional: Alert, NAD. Able to speak in complete sentences without cough or dyspnea  Eyes: Conjunctivae are normal.  Head: Atraumatic. Nose: No congestion/rhinnorhea. Mouth/Throat: Mucous membranes are dry Neck: Painless ROM. Supple. No JVD, nodes, thyromegaly  Cardiovascular:   Good peripheral circulation.RRR no murmurs, gallops, rubs  Respiratory: Normal respiratory effort.  No retractions. Clear to auscultation bilaterally without wheezing or crackles  Gastrointestinal: Skin is intact, no scars no ecchymosis.  Bowel sounds positive.soft and nontender.  Burney point negative rebound negative Rovsing negative Musculoskeletal:  no deformity Neurologic:  MAE spontaneously. No gross focal neurologic deficits are appreciated.  Skin:  Skin is warm, dry and intact. No rash noted. Psychiatric:  Mood and affect are normal. Speech and behavior are normal.    ED Results / Procedures / Treatments   Labs (all labs ordered are listed, but only abnormal results are displayed) Labs Reviewed  COMPREHENSIVE METABOLIC PANEL - Abnormal; Notable for the following components:      Result Value   Potassium 3.4 (*)    Glucose, Bld 101 (*)    Creatinine, Ser 1.06 (*)    Calcium 8.6 (*)    All other components within normal limits  URINALYSIS, ROUTINE W REFLEX MICROSCOPIC - Abnormal; Notable for the following components:   Color, Urine STRAW (*)    APPearance CLEAR (*)    Specific Gravity, Urine 1.002 (*)    All other components within normal limits  LIPASE, BLOOD  CBC  POC URINE PREG, ED     EKG     RADIOLOGY I independently reviewed and interpreted imaging and agree with radiologists findings.      PROCEDURES:  Critical Care performed:   Procedures   MEDICATIONS ORDERED IN ED: Medications  sodium chloride 0.9 % bolus 500 mL (0 mLs Intravenous Stopped 04/20/23 1951)  polyethylene glycol (MIRALAX / GLYCOLAX) packet 17 g (17 g Oral Given 04/20/23 1913)  magnesium hydroxide (  MILK OF MAGNESIA) suspension 15 mL (15 mLs Oral Given 04/20/23 1913)   Clinical Course as of 04/20/23 1952  Wynelle Link Apr 20, 2023  1823 Comprehensive metabolic panel(!) Hypokalemia 3.4, fattening elevated 1.06, hypocalcemia 8.6 [AE]  1824 CBC Within normal limits [AE]  1824 Lipase, blood Normal limits [AE]  1824 POC urine preg, ED Negative [AE]  1840 Urinalysis, Routine w reflex microscopic -Urine, Clean Catch(!) Negative [AE]    Clinical Course User Index [AE] Gladys Damme, PA-C    IMPRESSION / MDM / ASSESSMENT AND PLAN / ED COURSE  I reviewed the triage vital signs and the nursing notes.  Differential diagnosis includes, but is not limited to, constipation, Wegovy side effect, intestinal obstruction  Patient's presentation is most consistent with acute complicated illness / injury  requiring diagnostic workup.   Patient's diagnosis is consistent with constipation side effect of Wegovy, dehydration.. Labs are  reassuring. I did review the patient's allergies and medications.admission patient received IV fluids, MiraLAX, milk of magnesia.Patient did not have bowel movement during admission.  Advised patient to keep taking MiraLAX at home as directed. The patient is in stable and satisfactory condition for discharge home  Patient will be discharged home without prescriptions. Patient is to follow up with PCP as needed or otherwise directed. Patient is given ED precautions to return to the ED for any worsening or new symptoms. Discussed plan of care with patient, answered all of patient's questions, Patient agreeable to plan of care. Advised patient to take medications according to the instructions on the label. Discussed possible side effects of new medications. Patient verbalized understanding.    FINAL CLINICAL IMPRESSION(S) / ED DIAGNOSES   Final diagnoses:  Slow transit constipation  Dehydration     Rx / DC Orders   ED Discharge Orders     None        Note:  This document was prepared using Dragon voice recognition software and may include unintentional dictation errors.   Gladys Damme, PA-C 04/20/23 1952    Trinna Post, MD 04/21/23 [phone]

## 2023-04-20 NOTE — ED Triage Notes (Signed)
 Patient to ED via POV for constipation. Last BM 1 weeks ago. Seen for abd pain on Tuesday night after upping wegovy dose.

## 2023-04-23 DIAGNOSIS — L308 Other specified dermatitis: Secondary | ICD-10-CM | POA: Diagnosis not present

## 2023-04-23 DIAGNOSIS — L538 Other specified erythematous conditions: Secondary | ICD-10-CM | POA: Diagnosis not present

## 2023-04-23 DIAGNOSIS — R208 Other disturbances of skin sensation: Secondary | ICD-10-CM | POA: Diagnosis not present

## 2023-04-23 DIAGNOSIS — D485 Neoplasm of uncertain behavior of skin: Secondary | ICD-10-CM | POA: Diagnosis not present

## 2023-04-23 DIAGNOSIS — D235 Other benign neoplasm of skin of trunk: Secondary | ICD-10-CM | POA: Diagnosis not present

## 2023-06-04 ENCOUNTER — Telehealth: Payer: Self-pay

## 2023-06-04 ENCOUNTER — Encounter: Payer: Self-pay | Admitting: Family Medicine

## 2023-06-04 ENCOUNTER — Ambulatory Visit: Payer: Self-pay | Admitting: Family Medicine

## 2023-06-04 VITALS — BP 120/84 | HR 94 | Ht 59.0 in | Wt 215.8 lb

## 2023-06-04 DIAGNOSIS — Z6841 Body Mass Index (BMI) 40.0 and over, adult: Secondary | ICD-10-CM

## 2023-06-04 DIAGNOSIS — K5909 Other constipation: Secondary | ICD-10-CM | POA: Diagnosis not present

## 2023-06-04 DIAGNOSIS — M25562 Pain in left knee: Secondary | ICD-10-CM | POA: Diagnosis not present

## 2023-06-04 MED ORDER — OZEMPIC (0.25 OR 0.5 MG/DOSE) 2 MG/3ML ~~LOC~~ SOPN: 0.2500 mg | PEN_INJECTOR | SUBCUTANEOUS | 0 refills | Status: DC

## 2023-06-04 NOTE — Assessment & Plan Note (Signed)
 She reports left leg pain, specifically from the knee down to the shin, which worsens with activity. The pain is severe enough to require her to sit down frequently. She has been using Voltaren gel and wearing a brace, but the pain persists. No knee pain is reported, but pain is noted from the knee to the shin.  Physical exam Left knee: Inspection with no abnormalities, range of motion 0-125 degrees, terminal flexion discomfort, prominent tenderness of the lateral joint line and lateral patellar facet, no ligamentous laxity.  Left knee arthralgia Pain focal to patellofemoral and lateral tibiofemoral articulations, downstream compensatory discomfort throughout anterior tibialis. - Apply Voltaren gel on knee and shin up to four times daily. - Schedule close follow-up with low threshold to utilize cortisone injection if still symptomatic. - Avoid meloxicam  (and oral NSAIDs in general) to prevent gastrointestinal issues.

## 2023-06-04 NOTE — Telephone Encounter (Signed)
 Spoke with Denise Walker who let me know Ozempic  needed a PA with ICD code Z68.41  JM  Copied from CRM [id number]. Topic: Clinical - Prescription Issue >> Jun 04, 2023 11:11 AM Carlatta H wrote: Reason for CRM: Please call Katie at [facility] [phone]//she needs a diagnoses code for the Semaglutide ,0.25 or 0.5MG /DOS, (OZEMPIC , 0.25 OR 0.5 MG/DOSE,) 2 MG/3ML SOPN [[id number]] prescription

## 2023-06-04 NOTE — Patient Instructions (Signed)
 Patient Plan for Post-Visit Guidance  1. Left Knee Arthralgia:    - Apply Voltaren gel to your knee and shin up to four times daily.    - Follow up closely with your healthcare provider. A cortisone injection may be considered if symptoms persist.    - Avoid using meloxicam  or other oral NSAIDs to prevent stomach issues.  2. Weight Management with Wegovy :    - Start Wegovy  at 0.25 mg. If tolerated, increase to 0.5 mg after one month.    - Schedule a virtual appointment with a nutritionist for dietary guidance.    - Monitor your weight and side effects at the 0.5 mg dose. Continue at this dose if weight loss is satisfactory and side effects are manageable.  3. Chronic Constipation:    - Take a fiber supplement daily, such as Benefiber, Metamucil, or Citrucel.    - Use Miralax  up to twice daily as needed. Measure 17 grams per dose.    - Increase your intake of fiber and water.    - Consider magnesium  supplements if necessary.    - Monitor your bowel movements and adjust your regimen to prevent constipation.  Red Flags: - For knee pain: Seek immediate medical attention if you experience increased pain, swelling, or difficulty walking. - For Wegovy  use: Contact your healthcare provider if you experience severe nausea, vomiting, or any other significant side effects. - For constipation: If you have severe abdominal pain or no bowel movement for several days, contact your healthcare provider.  Please follow these steps and reach out if you have questions or need further assistance.

## 2023-06-04 NOTE — Assessment & Plan Note (Signed)
 Chronic constipation Chronic constipation with episodes requiring ER visits. Current laxatives insufficient, necessitating more effective management. - Start Benefiber / Metamucil / Citrucel fiber supplement daily - Use Miralax  up to twice daily as needed. 17 G twice daily. - Increase fiber intake and water consumption. - Consider magnesium  supplementation if needed. - Monitor bowel movements and adjust regimen to prevent constipation.

## 2023-06-04 NOTE — Progress Notes (Signed)
 Primary Care / Sports Medicine Office Visit  Patient Information:  Patient ID: KIARAH ECKSTEIN, female DOB: 28-Jun-1990 Age: 33 y.o. MRN: [id number]   MADOLYN ACKROYD is a pleasant 33 y.o. female presenting with the following:  Chief Complaint  Patient presents with   Weight Check    Patient following up on her weight management. She has been taking wegovy  since August 2024. She has been off for a month do to stomach issues.     Vitals:   06/04/23 0850  BP: 120/84  Pulse: 94  SpO2: 97%   Vitals:   06/04/23 0850  Weight: 215 lb 12.8 oz (97.9 kg)  Height: 4\' 11"  (1.499 m)   Body mass index is 43.59 kg/m.  No results found.   Independent interpretation of notes and tests performed by another provider:   None  Procedures performed:   None  Pertinent History, Exam, Impression, and Recommendations:   Problem List Items Addressed This Visit     Arthralgia of left knee   She reports left leg pain, specifically from the knee down to the shin, which worsens with activity. The pain is severe enough to require her to sit down frequently. She has been using Voltaren gel and wearing a brace, but the pain persists. No knee pain is reported, but pain is noted from the knee to the shin.  Physical exam Left knee: Inspection with no abnormalities, range of motion 0-125 degrees, terminal flexion discomfort, prominent tenderness of the lateral joint line and lateral patellar facet, no ligamentous laxity.  Left knee arthralgia Pain focal to patellofemoral and lateral tibiofemoral articulations, downstream compensatory discomfort throughout anterior tibialis. - Apply Voltaren gel on knee and shin up to four times daily. - Schedule close follow-up with low threshold to utilize cortisone injection if still symptomatic. - Avoid meloxicam  (and oral NSAIDs in general) to prevent gastrointestinal issues.      BMI 40.0-44.9, adult [facility]) - Primary   History of Present Illness AYDEE MCNEW is a 33 year old female who presents for follow-up regarding Wegovy  use and associated side effects.  She has been using Wegovy  since October, initially experiencing significant weight loss from 219 pounds in September to 208 pounds by November. However, her weight has since increased to 215 pounds. Wegovy  effectively suppressed her appetite. Despite these benefits, she experienced severe side effects when her dosage reached one milligram. After administering the shot on a Sunday night, she experienced intense nausea and vomiting the following morning, leading to dehydration and an emergency room visit on March 4th.  She recalls a second ER visit on March 9th due to severe constipation, despite taking Miralax , Exlax, and fiber at home. The ER provided additional Miralax , which eventually alleviated her symptoms. She was informed of having diverticulosis/diverticulitis, likely due to chronic constipation, although she has not noticed symptoms of chronic constipation herself.   Weight Management Body mass index is 43.59 kg/m. Initial weight loss with Wegovy  was significant; higher doses caused severe side effects. Dietary changes necessary for efficacy and minimizing adverse effects. - Start Wegovy  at 0.25 mg, increase to 0.5 mg after one month if tolerated. - Refer to nutritionist for virtual dietary guidance. - Monitor weight loss and side effects at 0.5 mg dose. - Consider maintaining 0.5 mg dose if weight loss is adequate and side effects manageable.      Relevant Medications   Semaglutide ,0.25 or 0.5MG /DOS, (OZEMPIC , 0.25 OR 0.5 MG/DOSE,) 2 MG/3ML SOPN   Other Relevant  Orders   Amb ref to Medical Nutrition Therapy-MNT   Chronic constipation   Chronic constipation Chronic constipation with episodes requiring ER visits. Current laxatives insufficient, necessitating more effective management. - Start Benefiber / Metamucil / Citrucel fiber supplement daily - Use Miralax  up to twice daily  as needed. 17 G twice daily. - Increase fiber intake and water consumption. - Consider magnesium  supplementation if needed. - Monitor bowel movements and adjust regimen to prevent constipation.      I provided a total time of 43 minutes including both face-to-face and non-face-to-face time on 06/04/2023 inclusive of time utilized for medical chart review, information gathering, care coordination with staff, and documentation completion.   Orders & Medications Medications:  Meds ordered this encounter  Medications   Semaglutide ,0.25 or 0.5MG /DOS, (OZEMPIC , 0.25 OR 0.5 MG/DOSE,) 2 MG/3ML SOPN    Sig: Inject 0.25 mg into the skin once a week.    Dispense:  3 mL    Refill:  0   Orders Placed This Encounter  Procedures   Amb ref to Medical Nutrition Therapy-MNT     Return in about 3 months (around 09/03/2023) for Weight MGMT f/u.     Ma Saupe, MD, [facility]   Primary Care Sports Medicine Primary Care and Sports Medicine at [facility]

## 2023-06-04 NOTE — Assessment & Plan Note (Signed)
 History of Present Illness Denise Walker is a 33 year old female who presents for follow-up regarding Wegovy  use and associated side effects.  She has been using Wegovy  since October, initially experiencing significant weight loss from 219 pounds in September to 208 pounds by November. However, her weight has since increased to 215 pounds. Wegovy  effectively suppressed her appetite. Despite these benefits, she experienced severe side effects when her dosage reached one milligram. After administering the shot on a Sunday night, she experienced intense nausea and vomiting the following morning, leading to dehydration and an emergency room visit on March 4th.  She recalls a second ER visit on March 9th due to severe constipation, despite taking Miralax , Exlax, and fiber at home. The ER provided additional Miralax , which eventually alleviated her symptoms. She was informed of having diverticulosis/diverticulitis, likely due to chronic constipation, although she has not noticed symptoms of chronic constipation herself.   Weight Management Body mass index is 43.59 kg/m. Initial weight loss with Wegovy  was significant; higher doses caused severe side effects. Dietary changes necessary for efficacy and minimizing adverse effects. - Start Wegovy  at 0.25 mg, increase to 0.5 mg after one month if tolerated. - Refer to nutritionist for virtual dietary guidance. - Monitor weight loss and side effects at 0.5 mg dose. - Consider maintaining 0.5 mg dose if weight loss is adequate and side effects manageable.

## 2023-06-06 ENCOUNTER — Other Ambulatory Visit (INDEPENDENT_AMBULATORY_CARE_PROVIDER_SITE_OTHER): Payer: Self-pay | Admitting: Radiology

## 2023-06-06 ENCOUNTER — Encounter: Payer: Self-pay | Admitting: Family Medicine

## 2023-06-06 ENCOUNTER — Ambulatory Visit (INDEPENDENT_AMBULATORY_CARE_PROVIDER_SITE_OTHER): Admitting: Family Medicine

## 2023-06-06 VITALS — BP 122/84 | HR 95 | Ht 59.0 in | Wt 215.0 lb

## 2023-06-06 DIAGNOSIS — M25562 Pain in left knee: Secondary | ICD-10-CM | POA: Diagnosis not present

## 2023-06-06 MED ORDER — TRIAMCINOLONE ACETONIDE 40 MG/ML IJ SUSP
40.0000 mg | Freq: Once | INTRAMUSCULAR | Status: AC
  Administered 2023-06-06: 40 mg via INTRAMUSCULAR

## 2023-06-06 NOTE — Assessment & Plan Note (Signed)
 History of Present Illness Denise Walker is a 33 year old female who presents with acute on chronic left knee pain and grinding sensations.  She experiences grinding sensations in her knee. Pain is primarily down the leg rather than directly in the knee, with localization to the front of the leg. The knee itself does not hurt much unless it is poked. Over the past couple of weeks, the pain has been significant.   She inquires about the potential benefit of using a knee brace, expressing concern about becoming too reliant on it, as she did with her shoulder in the past.  Physical Exam INSPECTION: No abnormalities on inspection of the left knee. PALPATION: No effusion in the left knee. Non-tender medial and lateral patellar facets. Tenderness at the patellar tendon, Hoffa's fat pad, lateral joint line, and mild tenderness at the posterior aspect of the medial joint line. RANGE OF MOTION: Maximum internal flexion painless from 0 to 125 degrees. SPECIAL TESTS: Negative anterior and posterior drawer testing. Negative valgus and varus stressing. McMurray's test localizing laterally.  Assessment and Plan Left knee arthralgia with focality to the lateral medial joint lines, patellar tendon and Hoffa's fat pad. Pain localized to lateral joint line and posterior medial joint line, likely due to inflamed cartilage. Cortisone injection considered for diagnostic and therapeutic purposes. - Administer cortisone injection to the knee under ultrasound guidance. - Provide hinged knee brace for support during activities that exacerbate pain.  Wear until symptoms respond to cortisone then transition to as needed usage for strenuous/prolonged activities. - Advise against using the knee brace for nonstrenuous daily activities once pain subsides. - Recommend home-based rehabilitation exercises to start once symptoms allow. - Contact us  at 2 weeks if knee/lower leg symptoms fail to adequately improve.

## 2023-06-06 NOTE — Patient Instructions (Addendum)
 You have just been given a cortisone injection to reduce pain and inflammation. After the injection you may notice immediate relief of pain as a result of the Lidocaine . It is important to rest the area of the injection for 24 to 48 hours after the injection. There is a possibility of some temporary increased discomfort and swelling for up to 72 hours until the cortisone begins to work. If you do have pain, simply rest the joint and use ice. If you can tolerate over the counter medications, you can try Tylenol , Aleve, or Advil  for added relief per package instructions.  Patient Plan  Knee Pain Management: 1. Schedule and receive a cortisone injection in the knee under ultrasound guidance. 2. Use a hinged knee brace during activities that cause pain. Once symptoms improve with cortisone, use the brace only for strenuous or prolonged activities. 3. Do not use the knee brace for nonstrenuous daily activities once the pain has subsided. 4. Begin home-based rehabilitation exercises when your symptoms allow. 5. Contact our office if your knee or lower leg symptoms do not improve adequately within two weeks.  Red Flags: - Report any worsening pain or new symptoms in the knee or lower leg.

## 2023-06-06 NOTE — Addendum Note (Signed)
 Addended by: Rama Burkitt on: 06/06/2023 04:22 PM   Modules accepted: Orders

## 2023-06-06 NOTE — Progress Notes (Signed)
 Primary Care / Sports Medicine Office Visit  Patient Information:  Patient ID: JODECI RINI, female DOB: 1991-01-15 Age: 33 y.o. MRN: [id number]   ARONDA BURFORD is a pleasant 33 y.o. female presenting with the following:  Chief Complaint  Patient presents with   Knee Pain    Left knee, 4 pain scale, sometimes a 8 on pain scale, sharp pain with every step    Vitals:   06/06/23 1326  BP: 122/84  Pulse: 95  SpO2: 98%   Vitals:   06/06/23 1326  Weight: 215 lb (97.5 kg)  Height: 4\' 11"  (1.499 m)   Body mass index is 43.42 kg/m.  No results found.   Independent interpretation of notes and tests performed by another provider:   None  Procedures performed:   Procedure:  Injection of left knee under ultrasound guidance. Ultrasound guidance utilized for anterolateral approach left knee, no sonographic abnormalities noted Samsung HS60 device utilized with permanent recording / reporting. Verbal informed consent obtained and verified. Skin prepped in a sterile fashion. Ethyl chloride for topical local analgesia.  Completed without difficulty and tolerated well. Medication: triamcinolone  acetonide 40 mg/mL suspension for injection 1 mL total and 2 mL lidocaine  1% without epinephrine utilized for needle placement anesthetic Advised to contact for fevers/chills, erythema, induration, drainage, or persistent bleeding.   Pertinent History, Exam, Impression, and Recommendations:   Problem List Items Addressed This Visit     Arthralgia of left knee - Primary   History of Present Illness BELLAMI FARRELLY is a 33 year old female who presents with acute on chronic left knee pain and grinding sensations.  She experiences grinding sensations in her knee. Pain is primarily down the leg rather than directly in the knee, with localization to the front of the leg. The knee itself does not hurt much unless it is poked. Over the past couple of weeks, the pain has been  significant.   She inquires about the potential benefit of using a knee brace, expressing concern about becoming too reliant on it, as she did with her shoulder in the past.  Physical Exam INSPECTION: No abnormalities on inspection of the left knee. PALPATION: No effusion in the left knee. Non-tender medial and lateral patellar facets. Tenderness at the patellar tendon, Hoffa's fat pad, lateral joint line, and mild tenderness at the posterior aspect of the medial joint line. RANGE OF MOTION: Maximum internal flexion painless from 0 to 125 degrees. SPECIAL TESTS: Negative anterior and posterior drawer testing. Negative valgus and varus stressing. McMurray's test localizing laterally.  Assessment and Plan Left knee arthralgia with focality to the lateral medial joint lines, patellar tendon and Hoffa's fat pad. Pain localized to lateral joint line and posterior medial joint line, likely due to inflamed cartilage. Cortisone injection considered for diagnostic and therapeutic purposes. - Administer cortisone injection to the knee under ultrasound guidance. - Provide hinged knee brace for support during activities that exacerbate pain.  Wear until symptoms respond to cortisone then transition to as needed usage for strenuous/prolonged activities. - Advise against using the knee brace for nonstrenuous daily activities once pain subsides. - Recommend home-based rehabilitation exercises to start once symptoms allow. - Contact us  at 2 weeks if knee/lower leg symptoms fail to adequately improve.      Relevant Orders   US  LIMITED JOINT SPACE STRUCTURES LOW LEFT     Orders & Medications Medications: No orders of the defined types were placed in this encounter.  Orders Placed This  Encounter  Procedures   US  LIMITED JOINT SPACE STRUCTURES LOW LEFT     Return if symptoms worsen or fail to improve.     Ma Saupe, MD, [facility]   Primary Care Sports Medicine Primary Care and Sports Medicine  at [facility]

## 2023-06-11 ENCOUNTER — Other Ambulatory Visit: Payer: Self-pay

## 2023-06-11 ENCOUNTER — Encounter: Payer: Self-pay | Admitting: Family Medicine

## 2023-06-11 DIAGNOSIS — Z6841 Body Mass Index (BMI) 40.0 and over, adult: Secondary | ICD-10-CM

## 2023-06-11 MED ORDER — WEGOVY 0.25 MG/0.5ML ~~LOC~~ SOAJ: 0.2500 mg | SUBCUTANEOUS | 3 refills | Status: DC

## 2023-06-13 ENCOUNTER — Other Ambulatory Visit: Payer: Self-pay

## 2023-06-13 ENCOUNTER — Telehealth: Payer: Self-pay | Admitting: Pharmacy Technician

## 2023-06-13 ENCOUNTER — Other Ambulatory Visit (HOSPITAL_COMMUNITY): Payer: Self-pay

## 2023-06-13 DIAGNOSIS — Z6841 Body Mass Index (BMI) 40.0 and over, adult: Secondary | ICD-10-CM

## 2023-06-13 MED ORDER — WEGOVY 0.25 MG/0.5ML ~~LOC~~ SOAJ: 0.2500 mg | SUBCUTANEOUS | 3 refills | Status: DC

## 2023-06-13 NOTE — Telephone Encounter (Signed)
 Pharmacy Patient Advocate Encounter   Received notification from Patient Advice Request messages that prior authorization for Wegovy  0.25MG /0.5ML auto-injectors is required/requested.   Insurance verification completed.   The patient is insured through [facility] .   Per test claim: PA required; PA submitted to above mentioned insurance via CoverMyMeds Key/confirmation #/EOC [facility] Status is pending

## 2023-06-13 NOTE — Telephone Encounter (Signed)
 Pharmacy Patient Advocate Encounter  Received notification from [facility] that Prior Authorization for Wegovy  0.25MG /0.5ML auto-injectors has been DENIED.  Full denial letter will be uploaded to the media tab. See denial reason below.       PA #/Case ID/Reference #: [id number]

## 2023-06-13 NOTE — Telephone Encounter (Signed)
 PA request for Wegovy .   Thank you,  Denise Walker

## 2023-06-13 NOTE — Telephone Encounter (Signed)
 PA request has been Submitted. New Encounter has been or will be created for follow up. For additional info see Pharmacy Prior Auth telephone encounter from 06/13/23.

## 2023-06-16 ENCOUNTER — Other Ambulatory Visit (HOSPITAL_COMMUNITY): Payer: Self-pay

## 2023-06-17 ENCOUNTER — Other Ambulatory Visit (HOSPITAL_COMMUNITY): Payer: Self-pay

## 2023-06-18 ENCOUNTER — Other Ambulatory Visit (HOSPITAL_COMMUNITY): Payer: Self-pay

## 2023-06-18 NOTE — Telephone Encounter (Signed)
 Patient is aware of Wegovy  denial. Do you work on appeals?

## 2023-06-18 NOTE — Telephone Encounter (Signed)
 Good morning, let me review the denial letter to see if we can appeal

## 2023-06-18 NOTE — Telephone Encounter (Signed)
 I resubmitted with additional info and got another denial today. The denial letter will be uploaded to pt's media tab once available.  CMM KEY: [id number]

## 2023-06-18 NOTE — Telephone Encounter (Signed)
 Thank you :)

## 2023-06-23 ENCOUNTER — Other Ambulatory Visit (HOSPITAL_COMMUNITY): Payer: Self-pay

## 2023-06-23 ENCOUNTER — Encounter: Payer: Self-pay | Admitting: Family Medicine

## 2023-06-23 NOTE — Telephone Encounter (Signed)
 Please resend for PA. Patient request.  Thank you,  JM

## 2023-06-26 NOTE — Telephone Encounter (Signed)
 Pt sent message about wegovy  I told her it was denied by her insurance. Pt stated that she talked to the insurance company and they stated they needed more information to be able to approved the medication.  KP

## 2023-06-26 NOTE — Telephone Encounter (Signed)
 Please review.  KP

## 2023-06-26 NOTE — Telephone Encounter (Signed)
 Noted, I will look into this further as soon as I get a chance today.

## 2023-06-27 ENCOUNTER — Other Ambulatory Visit (HOSPITAL_COMMUNITY): Payer: Self-pay

## 2023-06-27 NOTE — Telephone Encounter (Signed)
 Just got off the phone with Ms. [employer] and we will appeal the decision, the appeal process can take up to 30 days for a decision but I will update!!

## 2023-06-27 NOTE — Telephone Encounter (Signed)
 Noted  KP

## 2023-06-30 ENCOUNTER — Telehealth: Payer: Self-pay | Admitting: Pharmacist

## 2023-06-30 NOTE — Telephone Encounter (Signed)
 Acknowledged.

## 2023-06-30 NOTE — Telephone Encounter (Signed)
 Appeal has been submitted for Wegovy . Will advise when response is received, please be advised that most companies may take 30 days to make a decision. Appeal letter and supporting information have been emailed to [email or web address] on 06/30/2023 @12 :51 pm.  I spoke with the patient to make her aware the appeal was being submitted today.  Thank you, Dene Fines, PharmD Clinical Pharmacist  [place]  Direct Dial: [phone]

## 2023-07-03 ENCOUNTER — Other Ambulatory Visit (HOSPITAL_COMMUNITY): Payer: Self-pay

## 2023-07-08 ENCOUNTER — Other Ambulatory Visit (HOSPITAL_COMMUNITY): Payer: Self-pay

## 2023-07-09 NOTE — Telephone Encounter (Signed)
 It looks like doctor denied the referral. Hey Dr Vertell Gory, please provide a reason for denial and what speciality they should see

## 2023-07-17 ENCOUNTER — Other Ambulatory Visit (HOSPITAL_COMMUNITY): Payer: Self-pay

## 2023-07-21 ENCOUNTER — Other Ambulatory Visit (HOSPITAL_COMMUNITY): Payer: Self-pay

## 2023-07-25 ENCOUNTER — Other Ambulatory Visit (HOSPITAL_COMMUNITY): Payer: Self-pay

## 2023-07-28 ENCOUNTER — Other Ambulatory Visit (HOSPITAL_COMMUNITY): Payer: Self-pay

## 2023-07-28 NOTE — Telephone Encounter (Signed)
 The appeal for Wegovy  was denied by the insurance company, the denial letter has been uploaded under the media tab.

## 2023-07-30 NOTE — Telephone Encounter (Signed)
Sent pt Mychart message.  KP 

## 2023-08-01 ENCOUNTER — Encounter: Payer: Self-pay | Admitting: Student

## 2023-08-01 ENCOUNTER — Ambulatory Visit: Admitting: Student

## 2023-08-01 ENCOUNTER — Ambulatory Visit: Payer: Self-pay

## 2023-08-01 VITALS — BP 122/82 | HR 91 | Temp 98.3°F | Ht 59.0 in

## 2023-08-01 DIAGNOSIS — L03113 Cellulitis of right upper limb: Secondary | ICD-10-CM

## 2023-08-01 DIAGNOSIS — L0390 Cellulitis, unspecified: Secondary | ICD-10-CM | POA: Insufficient documentation

## 2023-08-01 NOTE — Telephone Encounter (Signed)
 Acknowledged.  JM

## 2023-08-01 NOTE — Telephone Encounter (Signed)
 Copied from CRM [id number]. Topic: Clinical - Red Word Triage >> Aug 01, 2023  9:42 AM Tiffini S wrote: [facility] that prompted transfer to Nurse Triage: Patient had a  bite yesterday from gardening in plants, did not see what bit her. Arnetta Lank it feels like a nail, sharp pain in the right forearm traveling in the direction of her hand.    Reason for Disposition  [1] Red streak or red line AND [2] length > 2 inches (5 cm)  Answer Assessment - Initial Assessment Questions 1. TYPE of INSECT: What type of insect was it?      Unsure, did not see 2. ONSET: When did you get bitten?      Last night  3. LOCATION: Where is the insect bite located?      Right forearm  4. REDNESS: Is the area red or pink? If Yes, ask: What size is area of redness? (inches or cm). When did the redness start?     Redness travelling up arm  5. PAIN: Is there any pain? If Yes, ask: How bad is it?  (Scale 1-10; or mild, moderate, severe)     Yes, sharp pain 6. ITCHING: Does it itch? If Yes, ask: How bad is the itch?    - MILD: doesn't interfere with normal activities   - MODERATE-SEVERE: interferes with work, school, sleep, or other activities      No 7. SWELLING: How big is the swelling? (inches, cm, or compare to coins)     N/A 8. OTHER SYMPTOMS: Do you have any other symptoms?  (e.g., difficulty breathing, hives)     No 9. PREGNANCY: Is there any chance you are pregnant? When was your last menstrual period?     No  Protocols used: Insect Bite-A-AH     FYI Only or Action Required?: FYI only for provider.  Patient was last seen in primary care on 06/06/2023 by Ma Saupe, MD. Called Nurse Triage reporting Insect Bite. Symptoms began yesterday. Interventions attempted: Nothing. Symptoms are: gradually worsening.  Triage Disposition: See HCP Within 4 Hours (Or PCP Triage)  Patient/caregiver understands and will follow disposition?: Yes

## 2023-08-01 NOTE — Assessment & Plan Note (Addendum)
 Local erythematous reaction with single punctum from insect bite yesterday. Denies tick bite. No central necrosis or concerning symptoms for complicated spider bite. Does not appear to have a retained foreign body. Does not appear acutely infected. Recommended calamine lotion or oral antihistamines for itching. Follow up as needed.

## 2023-08-01 NOTE — Progress Notes (Signed)
 Established Patient Office Visit  Subjective   Patient ID: Denise Walker, female    DOB: December 16, 1990  Age: 33 y.o. MRN: [id number]  Chief Complaint  Patient presents with   Insect Bite    Right forearm, redness, tender, felt like a bee sting, happened yesterday while cleaning up her garden    Denise Walker 33 year old person presents for an insect bite that occurred last night while picking  squash, felt like a bee sting but did not see what bit her. This morning noted increased redness with mild itching. She is concerned about spider bite. Denies fever, chills, diaphoresis, myalgias, abdominal pain, dizziness, lightheadedness, and throat swelling, or SOB.  Used a syringe to  scution the area but does not think this helped much.     Patient Active Problem List   Diagnosis Date Noted   Cellulitis 08/01/2023   Arthralgia of left knee 06/04/2023   Nausea and vomiting 04/15/2023   Costochondritis 03/06/2023   Chronic constipation 01/02/2023   Allergic rhinitis 01/02/2023   Excessive daytime sleepiness 11/18/2022   Vitamin D  deficiency 11/18/2022   Gastroesophageal reflux disease without esophagitis 06/02/2022   Recurrent sinusitis 04/19/2022   Annual physical exam 03/21/2022   BMI 40.0-44.9, adult (HCC) 03/21/2022   Encounter for weight management 02/18/2022   Mixed hyperlipidemia 06/04/2021   Prediabetes 06/04/2021   PCOS (polycystic ovarian syndrome) 05/01/2021   Chronic pain of right knee 05/01/2021   Chronic foot pain, left 05/01/2021   Chronic post-traumatic stress disorder (PTSD) 12/21/2020   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   ADHD (attention deficit hyperactivity disorder), 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   High risk medication use 06/29/2020   History of ADHD 06/29/2020   Uterine scar from previous cesarean delivery affecting pregnancy 12/31/2018    BRCA gene mutation negative 05/04/2018   Rh negative state in antepartum period 08/23/2017   Migraines 04/09/2017   Anxiety and depression 12/01/2014      ROS Refer to HPI    Objective:     BP 122/82   Pulse 91   Temp 98.3 F (36.8 C)   Ht 4' 11 (1.499 m)   SpO2 98%   BMI 43.42 kg/m  BP Readings from Last 3 Encounters:  08/01/23 122/82  06/06/23 122/84  06/04/23 120/84    Physical Exam Constitutional:      Appearance: Normal appearance.  HENT:     Head: Normocephalic and atraumatic.   Cardiovascular:     Rate and Rhythm: Normal rate and regular rhythm.  Pulmonary:     Effort: Pulmonary effort is normal.     Breath sounds: No rhonchi or rales.  Abdominal:     General: Bowel sounds are normal.   Musculoskeletal:        General: Normal range of motion.     Right lower leg: No edema.     Left lower leg: No edema.   Skin:    Capillary Refill: Capillary refill takes less than 2 seconds.     Comments: Area of erythema of the right forearm with small single puncture, see photo   Neurological:     General: No focal deficit present.     Mental Status: She is alert and oriented to person, place, and time.   Psychiatric:        Mood and Affect: Mood normal.        Behavior: Behavior normal.  08/01/2023   11:28 AM 06/06/2023    1:33 PM 04/15/2023    3:32 PM  Depression screen PHQ 2/9  Decreased Interest 1 1 1   Down, Depressed, Hopeless 0 0 0  PHQ - 2 Score 1 1 1   Altered sleeping 1 1 1   Tired, decreased energy 1 2 2   Change in appetite 1 1 2   Feeling bad or failure about yourself  0 0 0  Trouble concentrating 1 1 2   Moving slowly or fidgety/restless 0 0 0  Suicidal thoughts 0 0 0  PHQ-9 Score 5 6 8   Difficult doing work/chores Somewhat difficult  Somewhat difficult       08/01/2023   11:28 AM 06/06/2023    1:34 PM 04/15/2023    3:32 PM 03/06/2023    1:26 PM  GAD 7 : Generalized Anxiety Score  Nervous, Anxious, on Edge 1 1 1 1   Control/stop  worrying 0 0 1 1  Worry too much - different things 1 1 0 1  Trouble relaxing 0 0 0 0  Restless 0 0 0 0  Easily annoyed or irritable 1 1 1 1   Afraid - awful might happen 0 0 0 0  Total GAD 7 Score 3 3 3 4   Anxiety Difficulty Somewhat difficult Not difficult at all Somewhat difficult Somewhat difficult    No results found for any visits on 08/01/23.  Last CBC Lab Results  Component Value Date   WBC 6.9 04/20/2023   HGB 12.8 04/20/2023   HCT 39.8 04/20/2023   MCV 92.3 04/20/2023   MCH 29.7 04/20/2023   RDW 14.5 04/20/2023   PLT 259 04/20/2023   Last metabolic panel Lab Results  Component Value Date   GLUCOSE 101 (H) 04/20/2023   NA 137 04/20/2023   K 3.4 (L) 04/20/2023   CL 104 04/20/2023   CO2 25 04/20/2023   BUN 8 04/20/2023   CREATININE 1.06 (H) 04/20/2023   GFRNONAA >60 04/20/2023   CALCIUM 8.6 (L) 04/20/2023   PROT 7.4 04/20/2023   ALBUMIN 4.5 04/20/2023   LABGLOB 2.3 03/27/2022   AGRATIO 1.9 03/27/2022   BILITOT 0.9 04/20/2023   ALKPHOS 53 04/20/2023   AST 22 04/20/2023   ALT 16 04/20/2023   ANIONGAP 8 04/20/2023      The ASCVD Risk score (Arnett DK, et al., 2019) failed to calculate for the following reasons:   The 2019 ASCVD risk score is only valid for ages 35 to 40    Assessment & Plan:  Cellulitis of right upper extremity Assessment & Plan: Local erythematous reaction with single punctum from insect bite yesterday. Denies tick bite. No central necrosis or concerning symptoms for complicated spider bite. Does not appear to have a retained foreign body. Does not appear acutely infected. Recommended calamine lotion or oral antihistamines for itching. Follow up as needed.       Return if symptoms worsen or fail to improve.    Barnetta Liberty, MD

## 2023-08-05 ENCOUNTER — Ambulatory Visit: Admitting: Dietician

## 2023-08-24 ENCOUNTER — Telehealth: Admitting: Family Medicine

## 2023-08-24 DIAGNOSIS — T63424A Toxic effect of venom of ants, undetermined, initial encounter: Secondary | ICD-10-CM

## 2023-08-24 DIAGNOSIS — W57XXXA Bitten or stung by nonvenomous insect and other nonvenomous arthropods, initial encounter: Secondary | ICD-10-CM

## 2023-08-24 MED ORDER — CEPHALEXIN 500 MG PO CAPS: 500.0000 mg | ORAL_CAPSULE | Freq: Three times a day (TID) | ORAL | 0 refills | Status: AC

## 2023-08-24 NOTE — Progress Notes (Signed)
 Virtual Visit Consent   Denise Walker, you are scheduled for a virtual visit with a [facility] provider today. Just as with appointments in the office, your consent must be obtained to participate. Your consent will be active for this visit and any virtual visit you may have with one of our providers in the next 365 days. If you have a MyChart account, a copy of this consent can be sent to you electronically.  As this is a virtual visit, video technology does not allow for your provider to perform a traditional examination. This may limit your provider's ability to fully assess your condition. If your provider identifies any concerns that need to be evaluated in person or the need to arrange testing (such as labs, EKG, etc.), we will make arrangements to do so. Although advances in technology are sophisticated, we cannot ensure that it will always work on either your end or our end. If the connection with a video visit is poor, the visit may have to be switched to a telephone visit. With either a video or telephone visit, we are not always able to ensure that we have a secure connection.  By engaging in this virtual visit, you consent to the provision of healthcare and authorize for your insurance to be billed (if applicable) for the services provided during this visit. Depending on your insurance coverage, you may receive a charge related to this service.  I need to obtain your verbal consent now. Are you willing to proceed with your visit today? Denise Walker has provided verbal consent on 08/24/2023 for a virtual visit (video or telephone). Denise Lamp, FNP  Date: 08/24/2023 3:32 PM   Virtual Visit via Video Note   I, Denise Walker, connected with  Denise Walker  ([id number], 05/18/1990) on 08/24/23 at  3:30 PM EDT by a video-enabled telemedicine application and verified that I am speaking with the correct person using two identifiers.  Location: Patient: Virtual Visit Location Patient:  Home Provider: Virtual Visit Location Provider: Home Office   I discussed the limitations of evaluation and management by telemedicine and the availability of in person appointments. The patient expressed understanding and agreed to proceed.    History of Present Illness: Denise Walker is a 33 y.o. who identifies as a female who was assigned female at birth, and is being seen today for fire ant bites on rt foot. Top of foot is red, hot and swollen. No fever. Sx for 2 days. Her home flodded in [place]. SABRA  HPI: HPI  Problems:  Patient Active Problem List   Diagnosis Date Noted   Cellulitis 08/01/2023   Arthralgia of left knee 06/04/2023   Nausea and vomiting 04/15/2023   Costochondritis 03/06/2023   Chronic constipation 01/02/2023   Allergic rhinitis 01/02/2023   Excessive daytime sleepiness 11/18/2022   Vitamin D  deficiency 11/18/2022   Gastroesophageal reflux disease without esophagitis 06/02/2022   Recurrent sinusitis 04/19/2022   Annual physical exam 03/21/2022   BMI 40.0-44.9, adult (HCC) 03/21/2022   Encounter for weight management 02/18/2022   Mixed hyperlipidemia 06/04/2021   Prediabetes 06/04/2021   PCOS (polycystic ovarian syndrome) 05/01/2021   Chronic pain of right knee 05/01/2021   Chronic foot pain, left 05/01/2021   Chronic post-traumatic stress disorder (PTSD) 12/21/2020   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   ADHD (attention deficit hyperactivity disorder), 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   High risk medication  use 06/29/2020   History of ADHD 06/29/2020   Uterine scar from previous cesarean delivery affecting pregnancy 12/31/2018   BRCA gene mutation negative 05/04/2018   Rh negative state in antepartum period 08/23/2017   Migraines 04/09/2017   Anxiety and depression 12/01/2014    Allergies:  Allergies  Allergen Reactions   Fish Allergy  Shortness Of Breath   Shellfish Allergy Shortness Of Breath   Rizatriptan Other (See Comments)    Head pressure, neck locking up  rizatriptan   Trazodone And Nefazodone Other (See Comments)    Headache   Medications:  Current Outpatient Medications:    clobetasol ointment (TEMOVATE) 0.05 %, Apply topically as needed., Disp: , Rfl:    Dextromethorphan-buPROPion  ER (AUVELITY ) 45-105 MG TBCR, Take 1 tablet by mouth in the morning and at bedtime., Disp: 180 tablet, Rfl: 3   fluticasone  (FLONASE ) 50 MCG/ACT nasal spray, Place 2 sprays into both nostrils daily., Disp: 16 g, Rfl: 0   levonorgestrel  (MIRENA ) 20 MCG/24HR IUD, by Intrauterine route., Disp: , Rfl:    omeprazole  (PRILOSEC) 40 MG capsule, TAKE 1 CAPSULE (40 MG TOTAL) BY MOUTH DAILY., Disp: 90 capsule, Rfl: 0   Semaglutide ,0.25 or 0.5MG /DOS, (OZEMPIC , 0.25 OR 0.5 MG/DOSE,) 2 MG/3ML SOPN, Inject 0.25 mg into the skin once a week., Disp: 3 mL, Rfl: 0   WEGOVY  0.25 MG/0.5ML SOAJ, Inject 0.25 mg into the skin once a week. Use this dose for 1 month (4 shots) and then increase to next higher dose., Disp: 2 mL, Rfl: 3  Observations/Objective: Patient is well-developed, well-nourished in no acute distress.  Resting comfortably  at home.  Head is normocephalic, atraumatic.  No labored breathing.  Speech is clear and coherent with logical content.  Patient is alert and oriented at baseline.    Assessment and Plan: 1. Fire ant bite, undetermined intent, initial encounter (Primary)  2. Bug bite with infection, initial encounter  Elevate, cool compresses, uc as needed.   Follow Up Instructions: I discussed the assessment and treatment plan with the patient. The patient was provided an opportunity to ask questions and all were answered. The patient agreed with the plan and demonstrated an understanding of the instructions.  A copy of instructions were sent to the patient via MyChart unless otherwise noted below.     The patient was  advised to call back or seek an in-person evaluation if the symptoms worsen or if the condition fails to improve as anticipated.    Denise Lefebre, FNP ke

## 2023-08-24 NOTE — Patient Instructions (Signed)
 Insect Bite, Adult  An insect bite can make your skin red, itchy, and swollen. An insect bite is different from an insect sting, which happens when an insect injects poison (venom) into the skin.  Some insects can spread disease to people through a bite. However, most insect bites do not lead to disease and are not serious.  What are the causes?  Insects may bite for a variety of reasons, including:  Hunger.  To defend themselves.  Insects that bite include:  Spiders.  Mosquitoes and flies.  Ticks and fleas.  Ants.  Kissing bugs.  Chiggers.  What are the signs or symptoms?  In many cases, symptoms last for 2-4 days. However, itching can last up to 10 days. Symptoms include:  Itching or pain in the bite area.  Redness and swelling in the bite area.  An open wound (skin ulcer).  In rare cases, a person may have a severe allergic reaction (anaphylactic reaction) to a bite. Symptoms of an anaphylactic reaction may include:  Feeling warm in the face (flushed). This may include redness.  Itchy, red, swollen areas of skin (hives).  Swelling of the eyes, lips, face, mouth, tongue, or throat.  Wheezing or difficulty breathing, speaking, or swallowing.  Dizziness, light-headedness, or fainting.  Abdominal symptoms like cramping, nausea, vomiting, or diarrhea.  How is this diagnosed?  This condition is usually diagnosed based on symptoms and a physical exam. During the exam, your health care provider will look at the bite and ask you what kind of insect bit you.  How is this treated?  Most insect bites are not serious. Symptoms often go away on their own and treatment is not usually needed. When treatment is recommended, it may include:  Applying ice to the affected area.  Applying steroid or other anti-itch creams, like calamine lotion, to the bite area.  Medicines called antihistamines to reduce itching.  You may also need:  A tetanus shot if you are not up to date.  Antibiotic cream or an oral antibiotic if the bite becomes  infected (this is uncommon).  Follow these instructions at home:  Bite area care    Do not scratch the bite area. It may help to cover the bite area with a bandage or close-fitting clothing.  Keep the bite area clean and dry. Wash it every day with soap and water as told by your health care provider.  Check the bite area every day for signs of infection. Check for:  More redness, swelling, or pain.  Fluid or blood.  Warmth.  Pus or a bad smell.  Managing pain, itching, and swelling    You may apply cortisone cream, calamine lotion, or a paste made of baking soda and water to the bite area as told by your health care provider.  If directed, put ice on the bite area. To do this:  Put ice in a plastic bag.  Place a towel between your skin and the bag.  Leave the ice on for 20 minutes, 2-3 times a day.  If your skin turns bright red, remove the ice right away to prevent skin damage. The risk of skin damage is higher if you cannot feel pain, heat, or cold.  General instructions  Apply or take over-the-counter and prescription medicine only as told by your health care provider.  If you were prescribed antibiotics, take or apply them as told by your health care provider. Do not stop using the antibiotic even if you start to feel  better.  How is this prevented?  To help reduce your risk of insect bites:  When you are outdoors, wear clothing that covers your arms and legs. This is especially important in the early morning and evening.  Use insect repellent. The best insect repellents contain DEET, picaridin, oil of lemon eucalyptus (OLE), or IR3535.  Consider spraying your clothing with a pesticide called permethrin. Permethrin helps prevent insect bites. It works for several weeks and for up to 5-6 clothing washes. Do not apply permethrin directly to the skin.  If your home windows do not have screens, consider installing them.  If you will be sleeping in an area where there are mosquitoes, consider covering your sleeping  area with a mosquito net.  Contact a health care provider if:  Your bite area has signs of infection, such as:  More redness, swelling, or pain.  Fluid or blood.  Warmth.  Pus or a bad smell.  You have a fever.  Get help right away if:  You have a rash.  You have muscle or joint pain.  You feel unusually tired or weak.  You have neck pain or a headache.  You develop symptoms of an anaphylactic reaction. These may include:  Swelling of the eyes, lips, face, mouth, tongue, or throat.  Flushed skin or hives.  Wheezing.  Difficulty breathing, speaking, or swallowing.  Dizziness, light-headedness, or fainting.  Abdominal pain, cramping, vomiting, or diarrhea.  These symptoms may be an emergency. Get help right away. Call 911.  Do not wait to see if the symptoms will go away.  Do not drive yourself to the hospital.  Summary  An insect bite can make your skin red, itchy, and swollen.  Treatment is usually not needed. Symptoms often go away on their own. When treatment is recommended, it may involve taking medicine, applying medicine to the area, or applying ice.  Apply or take over-the-counter and prescription medicines only as told by your health care provider.  Use insect repellent to help prevent insect bites.  Contact a health care provider if your bite area has signs of infection.  This information is not intended to replace advice given to you by your health care provider. Make sure you discuss any questions you have with your health care provider.  Document Revised: 05/09/2021 Document Reviewed: 04/24/2021  Elsevier Patient Education  2024 [employer].

## 2023-08-26 ENCOUNTER — Other Ambulatory Visit (HOSPITAL_COMMUNITY): Payer: Self-pay

## 2023-08-26 ENCOUNTER — Telehealth: Payer: Self-pay

## 2023-08-26 NOTE — Telephone Encounter (Signed)
 Pharmacy Patient Advocate Encounter   Received notification from CoverMyMeds that prior authorization for Auvelity  45-105MG  er tablets  is required/requested.   Insurance verification completed.   The patient is insured through [facility] .   Per test claim: The current 90 day co-pay is, $4.00.  No PA needed at this time. This test claim was processed through [facility]- copay amounts may vary at other pharmacies due to pharmacy/plan contracts, or as the patient moves through the different stages of their insurance plan.

## 2023-09-03 ENCOUNTER — Ambulatory Visit: Admitting: Family Medicine

## 2023-09-03 ENCOUNTER — Encounter: Payer: Self-pay | Admitting: Family Medicine

## 2023-09-03 VITALS — BP 132/90 | HR 85 | Ht 59.0 in | Wt 217.6 lb

## 2023-09-03 DIAGNOSIS — R7303 Prediabetes: Secondary | ICD-10-CM | POA: Diagnosis not present

## 2023-09-03 DIAGNOSIS — Z0000 Encounter for general adult medical examination without abnormal findings: Secondary | ICD-10-CM | POA: Diagnosis not present

## 2023-09-03 DIAGNOSIS — E6609 Other obesity due to excess calories: Secondary | ICD-10-CM

## 2023-09-03 DIAGNOSIS — L2082 Flexural eczema: Secondary | ICD-10-CM

## 2023-09-03 DIAGNOSIS — E559 Vitamin D deficiency, unspecified: Secondary | ICD-10-CM

## 2023-09-03 DIAGNOSIS — R7309 Other abnormal glucose: Secondary | ICD-10-CM | POA: Diagnosis not present

## 2023-09-03 DIAGNOSIS — G4719 Other hypersomnia: Secondary | ICD-10-CM

## 2023-09-03 DIAGNOSIS — Z136 Encounter for screening for cardiovascular disorders: Secondary | ICD-10-CM

## 2023-09-03 DIAGNOSIS — R5383 Other fatigue: Secondary | ICD-10-CM | POA: Diagnosis not present

## 2023-09-03 DIAGNOSIS — E039 Hypothyroidism, unspecified: Secondary | ICD-10-CM | POA: Diagnosis not present

## 2023-09-04 ENCOUNTER — Encounter: Payer: Self-pay | Admitting: Family Medicine

## 2023-09-04 LAB — CBC
Hematocrit: 43 % (ref 34.0–46.6)
Hemoglobin: 13.7 g/dL (ref 11.1–15.9)
MCH: 29.6 pg (ref 26.6–33.0)
MCHC: 31.9 g/dL (ref 31.5–35.7)
MCV: 93 fL (ref 79–97)
Platelets: 260 x10E3/uL (ref 150–450)
RBC: 4.63 x10E6/uL (ref 3.77–5.28)
RDW: 12.7 % (ref 11.7–15.4)
WBC: 9.7 x10E3/uL (ref 3.4–10.8)

## 2023-09-04 LAB — HEMOGLOBIN A1C
Est. average glucose Bld gHb Est-mCnc: 120 mg/dL
Hgb A1c MFr Bld: 5.8 % — ABNORMAL HIGH (ref 4.8–5.6)

## 2023-09-04 LAB — COMPREHENSIVE METABOLIC PANEL WITH GFR
ALT: 19 IU/L (ref 0–32)
AST: 20 IU/L (ref 0–40)
Albumin: 4.6 g/dL (ref 3.9–4.9)
Alkaline Phosphatase: 91 IU/L (ref 44–121)
BUN/Creatinine Ratio: 13 (ref 9–23)
BUN: 11 mg/dL (ref 6–20)
Bilirubin Total: 0.3 mg/dL (ref 0.0–1.2)
CO2: 20 mmol/L (ref 20–29)
Calcium: 9.5 mg/dL (ref 8.7–10.2)
Chloride: 103 mmol/L (ref 96–106)
Creatinine, Ser: 0.83 mg/dL (ref 0.57–1.00)
Globulin, Total: 2.7 g/dL (ref 1.5–4.5)
Glucose: 86 mg/dL (ref 70–99)
Potassium: 4.6 mmol/L (ref 3.5–5.2)
Sodium: 141 mmol/L (ref 134–144)
Total Protein: 7.3 g/dL (ref 6.0–8.5)
eGFR: 95 mL/min/1.73 (ref >59)

## 2023-09-04 LAB — TSH: TSH: 8.81 u[IU]/mL — ABNORMAL HIGH (ref 0.450–4.500)

## 2023-09-04 LAB — LIPID PANEL
Chol/HDL Ratio: 3.3 ratio (ref 0.0–4.4)
Cholesterol, Total: 174 mg/dL (ref 100–199)
HDL: 52 mg/dL (ref >39)
LDL Chol Calc (NIH): 99 mg/dL (ref 0–99)
Triglycerides: 133 mg/dL (ref 0–149)
VLDL Cholesterol Cal: 23 mg/dL (ref 5–40)

## 2023-09-04 LAB — VITAMIN D 25 HYDROXY (VIT D DEFICIENCY, FRACTURES): Vit D, 25-Hydroxy: 25.2 ng/mL — ABNORMAL LOW (ref 30.0–100.0)

## 2023-09-04 NOTE — Telephone Encounter (Signed)
 Please review and advise.   JM

## 2023-09-04 NOTE — Telephone Encounter (Signed)
 Please schedule for home sleep test

## 2023-09-06 ENCOUNTER — Ambulatory Visit: Payer: Self-pay | Admitting: Family Medicine

## 2023-09-06 DIAGNOSIS — L2082 Flexural eczema: Secondary | ICD-10-CM | POA: Insufficient documentation

## 2023-09-06 DIAGNOSIS — E038 Other specified hypothyroidism: Secondary | ICD-10-CM

## 2023-09-06 MED ORDER — TRIAMCINOLONE ACETONIDE 0.1 % EX CREA: 1.0000 | TOPICAL_CREAM | Freq: Two times a day (BID) | CUTANEOUS | 0 refills | Status: DC

## 2023-09-06 MED ORDER — LEVOTHYROXINE SODIUM 75 MCG PO TABS: 75.0000 ug | ORAL_TABLET | Freq: Every day | ORAL | 0 refills | Status: DC

## 2023-09-06 MED ORDER — VITAMIN D (ERGOCALCIFEROL) 1.25 MG (50000 UNIT) PO CAPS
50000.0000 [IU] | ORAL_CAPSULE | ORAL | 0 refills | Status: DC
Start: 2023-09-06 — End: 2023-10-06

## 2023-09-06 NOTE — Patient Instructions (Signed)
 Patient Plan for Post-Visit Guidance  1. Excessive Daytime Sleepiness and Sleep Apnea Evaluation:    - Contact sleep medicine to arrange another home sleep study for sleep apnea evaluation.  2. Flexural Eczema:    - Use the prescribed steroid cream sparingly for up to two weeks.    - Take pictures of eczema flares to share with dermatology during consultation.    - Follow up with dermatology for potential stronger treatments if needed.  3. Obesity and Weight Management:    - Contact sleep medicine for a home sleep study to evaluate for obstructive sleep apnea (OSA).    - Consider alternative weight loss medication if sleep apnea is diagnosed.    - Discuss surgical options if other treatments are not successful.  Red Flags: - If you experience any new or worsening symptoms, contact the office immediately.

## 2023-09-06 NOTE — Assessment & Plan Note (Signed)
 Obesity and weight management - Experiencing challenges obtaining insurance coverage for weight loss medication - Medicaid denied coverage due to insufficient weight loss from baseline (less than 5% reduction required for approval) - Previously used a weight loss medication that is also indicated for diabetes management  Obesity Obesity management complicated by recent insurance denial following initial coverage of weight loss medication Wegovy . Explored Zepbound as an alternative. Surgical options considered if other treatments fail. - Contact sleep medicine for home sleep study to evaluate for OSA. - Consider alternative weight loss medication if sleep apnea is diagnosed. - Discuss surgical options if other treatments fail.

## 2023-09-06 NOTE — Progress Notes (Signed)
 Primary Care / Sports Medicine Office Visit  Patient Information:  Patient ID: Denise Walker, female DOB: [birth year] Age: 33 y.o. MRN: [id number]   Denise Walker is a pleasant 33 y.o. female presenting with the following:  Chief Complaint  Patient presents with   Weight Check    Patient here to discuss weight loss treatment option. Insurance has denied Wegovy  and     Vitals:   09/03/23 1045  BP: (!) 132/90  Pulse: 85  SpO2: 98%   Vitals:   09/03/23 1045  Weight: 217 lb 9.6 oz (98.7 kg)  Height: 4' 11 (1.499 m)   Body mass index is 43.95 kg/m.  No results found.   Independent interpretation of notes and tests performed by another provider:   None  Procedures performed:   None  Pertinent History, Exam, Impression, and Recommendations:   Problem List Items Addressed This Visit     Excessive daytime sleepiness   Sleep disturbance and sleep apnea evaluation - Attempted home sleep study for sleep apnea evaluation; results inconclusive due to disruption by children, she is will to try again - In-lab sleep study not completed due to insurance denial  Daytime somnolence - Contact sleep medicine for home sleep study.      Flexural eczema   Eczematous dermatitis - History of eczema primarily affecting hands and wrists, with recent involvement of the right antecubital space - Uses clobetasol cream for management, which provides temporary relief but symptoms recur, especially during periods of stress - Unable to secure a dermatology appointment recently    Media Information   Document Information  Photos  Right antecubital  09/03/2023 11:26  Attached To:  Office Visit on 09/03/23 with Alvia Selinda PARAS, MD  Source Information  Alvia Selinda PARAS, MD  [facility]   Eczema Eczema on flexor surface and antecubital space with intermittent flares. Current treatment with clobetasol effective but not long-lasting. Stress is a trigger. Discussed  potential for stronger treatments or injections if worsens. - Send prescription for steroid cream to use sparingly x up to 2 weeks. - Advise to take pictures of eczema flares for dermatology consultation. - Encourage follow-up with dermatology for potential stronger treatments.      Relevant Medications   triamcinolone  cream (KENALOG ) 0.1 %   Other Relevant Orders   Ambulatory referral to Dermatology   Obesity due to excess calories with serious comorbidity   Obesity and weight management - Experiencing challenges obtaining insurance coverage for weight loss medication - Medicaid denied coverage due to insufficient weight loss from baseline (less than 5% reduction required for approval) - Previously used a weight loss medication that is also indicated for diabetes management  Obesity Obesity management complicated by recent insurance denial following initial coverage of weight loss medication Wegovy . Explored Zepbound as an alternative. Surgical options considered if other treatments fail. - Contact sleep medicine for home sleep study to evaluate for OSA. - Consider alternative weight loss medication if sleep apnea is diagnosed. - Discuss surgical options if other treatments fail.      Prediabetes   Relevant Orders   Hemoglobin A1c (Completed)   Vitamin D  deficiency   Relevant Orders   VITAMIN D  25 Hydroxy (Vit-D Deficiency, Fractures) (Completed)   Other Visit Diagnoses       Healthcare maintenance    -  Primary   Relevant Orders   CBC (Completed)   VITAMIN D  25 Hydroxy (Vit-D Deficiency, Fractures) (Completed)     Abnormal glucose  Relevant Orders   Hemoglobin A1c (Completed)     Screening for cardiovascular condition       Relevant Orders   Comprehensive metabolic panel with GFR (Completed)   Lipid panel (Completed)     Other fatigue       Relevant Orders   TSH (Completed)      A total of 48 minutes was spent on the date of service, 09/06/2023, encompassing  both face-to-face and non-face-to-face time. This included review of prior records and imaging (e.g., MRI and/or radiographs), medical chart review, information gathering, documentation, care coordination with clinic staff, discussion and counseling with the patient regarding clinical findings and treatment options, and planning for follow-up and next steps in management.   Orders & Medications Medications:  Meds ordered this encounter  Medications   triamcinolone  cream (KENALOG ) 0.1 %    Sig: Apply 1 Application topically 2 (two) times daily. For up to 2 weeks, then stop.    Dispense:  60 g    Refill:  0   Orders Placed This Encounter  Procedures   CBC   Comprehensive metabolic panel with GFR   Hemoglobin A1c   Lipid panel   TSH   VITAMIN D  25 Hydroxy (Vit-D Deficiency, Fractures)   Ambulatory referral to Dermatology     Return in about 1 month (around 10/04/2023) for CPE.     Selinda JINNY Ku, MD, [facility]   Primary Care Sports Medicine Primary Care and Sports Medicine at [facility]

## 2023-09-06 NOTE — Assessment & Plan Note (Signed)
 Eczematous dermatitis - History of eczema primarily affecting hands and wrists, with recent involvement of the right antecubital space - Uses clobetasol cream for management, which provides temporary relief but symptoms recur, especially during periods of stress - Unable to secure a dermatology appointment recently    Media Information   Document Information  Photos  Right antecubital  09/03/2023 11:26  Attached To:  Office Visit on 09/03/23 with Alvia Selinda PARAS, MD  Source Information  Alvia Selinda PARAS, MD  [facility]   Eczema Eczema on flexor surface and antecubital space with intermittent flares. Current treatment with clobetasol effective but not long-lasting. Stress is a trigger. Discussed potential for stronger treatments or injections if worsens. - Send prescription for steroid cream to use sparingly x up to 2 weeks. - Advise to take pictures of eczema flares for dermatology consultation. - Encourage follow-up with dermatology for potential stronger treatments.

## 2023-09-06 NOTE — Assessment & Plan Note (Signed)
 Sleep disturbance and sleep apnea evaluation - Attempted home sleep study for sleep apnea evaluation; results inconclusive due to disruption by children, she is will to try again - In-lab sleep study not completed due to insurance denial  Daytime somnolence - Contact sleep medicine for home sleep study.

## 2023-09-08 NOTE — Telephone Encounter (Signed)
 Please review and advise patient.   JM

## 2023-09-09 LAB — SPECIMEN STATUS REPORT

## 2023-09-09 LAB — T3, FREE: T3, Free: 3.2 pg/mL (ref 2.0–4.4)

## 2023-09-09 LAB — T4, FREE: Free T4: 0.89 ng/dL (ref 0.82–1.77)

## 2023-09-10 NOTE — Telephone Encounter (Signed)
 I have spoke with Denise Walker and made her aware [name] has placed a new HST order. I have faxed records to her insurance trying to get her approved. Once she is approved I have like 20 patients ahead of her but I would be calling to get her scheduled once approved

## 2023-09-10 NOTE — Telephone Encounter (Signed)
 We will need a new order because the patient did pick up the HST machine but brought it back without using

## 2023-09-10 NOTE — Telephone Encounter (Signed)
 Please review patients messages.  JM

## 2023-09-10 NOTE — Telephone Encounter (Signed)
 Per note from patient see below Dr. Alvia would like me to try the at home sleep study again. Do I need to make another appointment or would I be able to get on the schedule to pick up the machine? Just let me know! Thank you!  Patient will need new home sleep test order placed because when she picked up the machine last time it shows the order completed

## 2023-09-10 NOTE — Telephone Encounter (Signed)
 Patient requested referral to endocrine due to fatigue and wanted hormone work up. I advised that I would place referral but recommended she discuss this with her PCP and that he could probably order any basic lab tests. If endocrine referral was denied, she should follow-up with her PCP  A HST was ordered in November and I was informed that patient wanted it changed to in-lab which I did. In-lab was then denied by her insurance. We can go ahead and re-order a home sleep test if that is what is needed.   If there are any further issues she needs an OV in person

## 2023-09-16 NOTE — Telephone Encounter (Signed)
 Order was placed Denise Walker has it to schedule once the Denise Walker is done

## 2023-10-06 ENCOUNTER — Encounter: Payer: Self-pay | Admitting: Family Medicine

## 2023-10-06 ENCOUNTER — Ambulatory Visit (INDEPENDENT_AMBULATORY_CARE_PROVIDER_SITE_OTHER): Admitting: Family Medicine

## 2023-10-06 VITALS — BP 118/76 | HR 85 | Ht 59.0 in | Wt 219.8 lb

## 2023-10-06 DIAGNOSIS — Z30431 Encounter for routine checking of intrauterine contraceptive device: Secondary | ICD-10-CM | POA: Diagnosis not present

## 2023-10-06 DIAGNOSIS — J302 Other seasonal allergic rhinitis: Secondary | ICD-10-CM | POA: Diagnosis not present

## 2023-10-06 DIAGNOSIS — E66813 Obesity, class 3: Secondary | ICD-10-CM | POA: Diagnosis not present

## 2023-10-06 DIAGNOSIS — E559 Vitamin D deficiency, unspecified: Secondary | ICD-10-CM

## 2023-10-06 DIAGNOSIS — Z6841 Body Mass Index (BMI) 40.0 and over, adult: Secondary | ICD-10-CM

## 2023-10-06 DIAGNOSIS — Z0000 Encounter for general adult medical examination without abnormal findings: Secondary | ICD-10-CM | POA: Diagnosis not present

## 2023-10-06 DIAGNOSIS — F419 Anxiety disorder, unspecified: Secondary | ICD-10-CM | POA: Diagnosis not present

## 2023-10-06 DIAGNOSIS — E038 Other specified hypothyroidism: Secondary | ICD-10-CM | POA: Diagnosis not present

## 2023-10-06 DIAGNOSIS — L2082 Flexural eczema: Secondary | ICD-10-CM

## 2023-10-06 DIAGNOSIS — F32A Depression, unspecified: Secondary | ICD-10-CM

## 2023-10-06 DIAGNOSIS — R7303 Prediabetes: Secondary | ICD-10-CM

## 2023-10-06 NOTE — Assessment & Plan Note (Signed)
 Eczema Eczema currently clear with intermittent flares. Dermatology office not accepting appointments. - Advise taking pictures of flares and sending to dermatologist via patient portal for evaluation.

## 2023-10-06 NOTE — Patient Instructions (Addendum)
-   Obtain fasting labs with orders provided (can have water or black coffee but otherwise no food or drink x 8 hours before labs) - Review information provided - Attend eye doctor annually, dentist every 6 months, work towards or maintain 30 minutes of moderate intensity physical activity at least 5 days per week, and consume a balanced diet - Return in 1 year for physical - Contact us  for any questions between now and then    Patient Plan for Post-Visit Guidance  Allergic Rhinitis - Continue taking Allegra as prescribed. - Add an intranasal steroid (such as Flonase ) for additional allergy control.  Anxiety and Depression - Increase Auvelity  to twice daily. - Monitor your response to the increased dose and report any changes in mood or side effects.  Flexural Eczema - Take pictures of eczema flares and send them to your dermatologist via the patient portal for evaluation.  Obesity - Attend bariatric surgery consultation as referred.  Prediabetes - Recheck A1c in two months before your next follow-up visit. - Continue lifestyle modifications to help prevent diabetes.  Subclinical Hypothyroidism - Continue taking Synthroid  75 mcg daily. - Recheck thyroid  labs as ordered.  Vitamin D  Deficiency - Continue current vitamin D  supplementation. - After finishing your current course, switch to over-the-counter vitamin D3, 1000-2000 IU daily.  Healthcare Maintenance - Annual exam and risk stratification labs completed; follow up as needed after results.  Red Flags - If you experience severe allergic reactions, new or worsening mood symptoms, severe skin infection, chest pain, shortness of breath, or any new or concerning symptoms, seek medical attention promptly.

## 2023-10-06 NOTE — Assessment & Plan Note (Addendum)
 Prediabetes A1c at 5.8, indicating prediabetes. No medication required, but lifestyle modifications discussed to prevent diabetes progression. - Recheck A1c in two months prior to follow-up visit.

## 2023-10-06 NOTE — Assessment & Plan Note (Signed)
 Allergic rhinitis Allergic rhinitis causing headaches and congestion. Currently managed with generic Allegra. - Add intranasal steroid (e.g., Flonase ) to current allergy management.

## 2023-10-06 NOTE — Assessment & Plan Note (Signed)
 Obesity Obesity management complicated by cessation of Wegovy  coverage. Discussed bariatric surgery, specifically gastric sleeve, as an alternative. - Refer to bariatric surgery for consultation.

## 2023-10-06 NOTE — Assessment & Plan Note (Signed)
 Annual examination completed, risk stratification labs ordered, anticipatory guidance provided.  We will follow labs once resulted.

## 2023-10-06 NOTE — Assessment & Plan Note (Signed)
 Thyroid  function and medication adherence - Taking Synthroid  75 mcg daily since September 06, 2023, for subclinical hypothyroidism - Improved energy levels and less frequent burnout since starting medication - Improvement and near resolution of constipation after starting Synthroid  - No jitteriness or feeling on edge - Difficulty taking medication on an empty stomach due to morning routine, but managing  Subclinical hypothyroidism Subclinical hypothyroidism with symptom improvement on Synthroid  75 mcg. No adverse effects reported. - Continue Synthroid  75 mcg daily. - Recheck thyroid  labs today.

## 2023-10-06 NOTE — Assessment & Plan Note (Signed)
 Psychological stress and medication management - Taking Auvelity  once daily for stress management - Mood described as 'not horrible'  Behavioral health Major depressive disorder managed with Auvelity . Discussed increasing dose to twice daily for symptom improvement. - Increase Auvelity  to twice daily. - Monitor response to increased dose and communicate any changes.

## 2023-10-06 NOTE — Assessment & Plan Note (Signed)
 Vitamin D  deficiency Vitamin D  deficiency managed with supplementation. Discussed transition to over-the-counter vitamin D3. - Continue current vitamin D  supplementation. - Transition to over-the-counter vitamin D3 1000-2000 IU daily after current course.

## 2023-10-06 NOTE — Progress Notes (Signed)
 Annual Physical Exam Visit  Patient Information:  Patient ID: Denise Walker, female DOB: Sep 15, 1990 Age: 33 y.o. MRN: [id number]   Subjective:   CC: Annual Physical Exam  HPI:  Denise Walker is here for their annual physical.  I reviewed the past medical history, family history, social history, surgical history, and allergies today and changes were made as necessary.  Please see the problem list section below for additional details.  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  Negative on 7/20 and 9/21.  Rhogam received 9/21     Anxiety    Back pain    low back pain with lumbar radiculitis   Bilateral shoulder pain    BRCA gene mutation negative 05/04/2018   MyRisk negative 04/15/2018 Remaining lifetime risk 6.7%     BRCA negative 04/2018   MyRisk neg; IBIS=8.9%/riskscore=6.7%   Bronchitis 01/21/2021   Eclampsia    Family history of ovarian cancer    Gestational diabetes    History of cesarean delivery 02/10/2018   History of eclampsia 07/22/2017   [X]  Aspirin 81 mg daily after 12 weeks; discontinue after 36 weeks     Baseline and surveillance labs (pulled in from EPIC, refresh links as needed)           Lab Results      Component    Value    Date           PLT    292    07/22/2017           CREATININE    0.73    07/22/2017           AST    16    07/22/2017           ALT    13    07/22/2017           PROTCRRATIO    69    07/16/2014             Migraine headache    Nausea and vomiting 04/15/2023   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   PCOS (polycystic ovarian syndrome)    Rh negative state in antepartum period 08/23/2017   Past Surgical History: Past Surgical History:  Procedure Laterality Date   CESAREAN SECTION N/A 07/15/2014   Procedure: CESAREAN SECTION;  Surgeon: Lamar SHAUNNA Lesches, MD;  Location: [facility] ORS;  Service:  Obstetrics;  Laterality: N/A;   CESAREAN SECTION N/A 02/10/2018   Procedure: CESAREAN SECTION;  Surgeon: Leonce Garnette JONETTA, MD;  Location: [facility] ORS;  Service: Obstetrics;  Laterality: N/A;   CESAREAN SECTION N/A 12/31/2018   Procedure: CESAREAN SECTION;  Surgeon: Lake Read, MD;  Location: [facility] ORS;  Service: Obstetrics;  Laterality: N/A;   CESAREAN SECTION     HERNIA REPAIR  10/2013   WISDOM TOOTH EXTRACTION     Family History: Family History  Problem Relation Age of Onset   Bipolar disorder Mother    Ovarian cancer Mother    Arthritis Mother    Ovarian cancer Maternal Aunt    Diabetes Paternal Aunt    Breast cancer Maternal Grandmother    Diabetes Paternal Grandfather    Uterine cancer Cousin    ADD / ADHD Daughter    ODD Daughter    Allergies: Allergies  Allergen Reactions   Fish Allergy Shortness Of Breath   Shellfish  Allergy Shortness Of Breath   Rizatriptan Other (See Comments)    Head pressure, neck locking up  rizatriptan   Trazodone And Nefazodone Other (See Comments)    Headache   Health Maintenance: Health Maintenance  Topic Date Due   INFLUENZA VACCINE  05/11/2024 (Originally 09/12/2023)   Hepatitis B Vaccines 19-59 Average Risk (1 of 3 - 19+ 3-dose series) 10/05/2024 (Originally 11/01/2007)   HPV VACCINES (1 - 3-dose SCDM series) 10/05/2024 (Originally 11/01/2015)   COVID-19 Vaccine (1) 02/22/2025 (Originally 10/31/1993)   Cervical Cancer Screening (HPV/Pap Cotest)  09/07/2025   DTaP/Tdap/Td (2 - Td or Tdap) 11/18/2028   Hepatitis C Screening  Completed   HIV Screening  Completed   Pneumococcal Vaccine  Aged Out   Meningococcal B Vaccine  Aged Out    HM Colonoscopy   This patient has no relevant Health Maintenance data.    Medications: Current Outpatient Medications on File Prior to Visit  Medication Sig Dispense Refill   clobetasol ointment (TEMOVATE) 0.05 % Apply topically as needed.     Dextromethorphan-buPROPion  ER (AUVELITY ) 45-105 MG  TBCR Take 1 tablet by mouth in the morning and at bedtime. 180 tablet 3   levonorgestrel  (MIRENA ) 20 MCG/24HR IUD by Intrauterine route.     levothyroxine  (SYNTHROID ) 75 MCG tablet Take 1 tablet (75 mcg total) by mouth daily. 60 tablet 0   omeprazole  (PRILOSEC) 40 MG capsule TAKE 1 CAPSULE (40 MG TOTAL) BY MOUTH DAILY. 90 capsule 0   No current facility-administered medications on file prior to visit.    Objective:   Vitals:   10/06/23 0900  BP: 118/76  Pulse: 85  SpO2: 98%   Vitals:   10/06/23 0900  Weight: 219 lb 12.8 oz (99.7 kg)  Height: 4' 11 (1.499 m)   Body mass index is 44.39 kg/m.  General: Well Developed, well nourished, and in no acute distress.  Neuro: Alert and oriented x3, extra-ocular muscles intact, sensation grossly intact. Cranial nerves II through XII are grossly intact, motor, sensory, and coordinative functions are intact. HEENT: Normocephalic, atraumatic, neck supple, no masses, no lymphadenopathy, thyroid  nonenlarged. Oropharynx, nasopharynx, external ear canals are unremarkable. Skin: Warm and dry, no rashes noted.  Cardiac: Regular rate and rhythm, no murmurs rubs or gallops. No peripheral edema. Pulses symmetric. Respiratory: Clear to auscultation bilaterally. Speaking in full sentences.  Abdominal: Soft, nontender, nondistended, positive bowel sounds, no masses, no organomegaly. Musculoskeletal: Stable, and with full range of motion.  Impression and Recommendations:   The patient was counselled, risk factors were discussed, and anticipatory guidance given.  Problem List Items Addressed This Visit     Allergic rhinitis   Allergic rhinitis Allergic rhinitis causing headaches and congestion. Currently managed with generic Allegra. - Add intranasal steroid (e.g., Flonase ) to current allergy management.      Anxiety and depression   Psychological stress and medication management - Taking Auvelity  once daily for stress management - Mood described  as 'not horrible'  Behavioral health Major depressive disorder managed with Auvelity . Discussed increasing dose to twice daily for symptom improvement. - Increase Auvelity  to twice daily. - Monitor response to increased dose and communicate any changes.      Flexural eczema   Eczema Eczema currently clear with intermittent flares. Dermatology office not accepting appointments. - Advise taking pictures of flares and sending to dermatologist via patient portal for evaluation.      Healthcare maintenance - Primary   Annual examination completed, risk stratification labs ordered, anticipatory guidance provided.  We will  follow labs once resulted.      Obesity due to excess calories with serious comorbidity   Obesity Obesity management complicated by cessation of Wegovy  coverage. Discussed bariatric surgery, specifically gastric sleeve, as an alternative. - Refer to bariatric surgery for consultation.      Relevant Orders   Amb Referral to Bariatric Surgery   Prediabetes   Prediabetes A1c at 5.8, indicating prediabetes. No medication required, but lifestyle modifications discussed to prevent diabetes progression. - Recheck A1c in two months prior to follow-up visit.      Subclinical hypothyroidism   Thyroid  function and medication adherence - Taking Synthroid  75 mcg daily since September 06, 2023, for subclinical hypothyroidism - Improved energy levels and less frequent burnout since starting medication - Improvement and near resolution of constipation after starting Synthroid  - No jitteriness or feeling on edge - Difficulty taking medication on an empty stomach due to morning routine, but managing  Subclinical hypothyroidism Subclinical hypothyroidism with symptom improvement on Synthroid  75 mcg. No adverse effects reported. - Continue Synthroid  75 mcg daily. - Recheck thyroid  labs today.        Vitamin D  deficiency   Vitamin D  deficiency Vitamin D  deficiency managed with  supplementation. Discussed transition to over-the-counter vitamin D3. - Continue current vitamin D  supplementation. - Transition to over-the-counter vitamin D3 1000-2000 IU daily after current course.      Other Visit Diagnoses       IUD check up       Relevant Orders   Ambulatory referral to Obstetrics / Gynecology        Orders & Medications Medications: No orders of the defined types were placed in this encounter.  Orders Placed This Encounter  Procedures   TSH + free T4   T3, free   Ambulatory referral to Obstetrics / Gynecology   Amb Referral to Bariatric Surgery     No follow-ups on file.    Selinda JINNY Ku, MD, [facility]   Primary Care Sports Medicine Primary Care and Sports Medicine at [facility]

## 2023-10-07 ENCOUNTER — Ambulatory Visit: Payer: Self-pay | Admitting: Family Medicine

## 2023-10-07 LAB — TSH+FREE T4
Free T4: 1.45 ng/dL (ref 0.82–1.77)
TSH: 1.75 u[IU]/mL (ref 0.450–4.500)

## 2023-10-07 LAB — T3, FREE: T3, Free: 3.6 pg/mL (ref 2.0–4.4)

## 2023-10-14 ENCOUNTER — Encounter

## 2023-10-15 ENCOUNTER — Encounter: Payer: Self-pay | Admitting: Family Medicine

## 2023-10-16 NOTE — Telephone Encounter (Signed)
 Please review.  KP

## 2023-10-21 ENCOUNTER — Encounter: Payer: Self-pay | Admitting: Family Medicine

## 2023-10-21 ENCOUNTER — Ambulatory Visit: Admitting: Family Medicine

## 2023-10-21 VITALS — BP 112/74 | HR 99 | Ht 59.0 in | Wt 219.0 lb

## 2023-10-21 DIAGNOSIS — E66813 Obesity, class 3: Secondary | ICD-10-CM

## 2023-10-21 DIAGNOSIS — Z6841 Body Mass Index (BMI) 40.0 and over, adult: Secondary | ICD-10-CM | POA: Diagnosis not present

## 2023-10-21 NOTE — Assessment & Plan Note (Signed)
 History of Present Illness Denise Walker is a 33 year old female who presents for evaluation and preparation for bariatric surgery.  Obesity and bariatric surgery preparation - Progressive weight gain over several years - Completed bariatric surgery seminar and associated test - Filled out required paperwork and written a letter, possibly to the surgeon - Has trialed multiple weight management medications, except naltrexone - Referred to a nutritionist for dietary counseling - Currently considering sleeve gastrectomy versus duodenal switch as surgical options - Seeking additional information about surgical options - Working on completing all preoperative requirements, including psychiatric evaluation  Glycemic control - Hemoglobin A1c slightly elevated on recent laboratory testing (prediabetic level)  Thyroid  function - History of thyroid  dysfunction, now resolved - Recent laboratory studies show normal thyroid  function  Renal function - Recent laboratory studies show normal kidney function  Sleep-disordered breathing evaluation - Possible sleep apnea under evaluation - In contact with sleep medicine specialists - Scheduling with sleep study currently being addressed  Physical Exam Vitals:   10/21/23 1346  BP: 112/74  Pulse: 99  Height: 4' 11 (1.499 m)  Weight: 219 lb (99.3 kg)  SpO2: 99%  BMI (Calculated): 44.21  CHEST: Lungs clear to auscultation bilaterally. CARDIOVASCULAR: Heart sounds normal with regular rate and rhythm.  Assessment and Plan Obesity with comorbid prediabetes and hyperlipidemia In process for scheduling with bariatric surgery, specifically considering duodenal switch and sleeve gastrectomy. Plans to discuss specifics with her bariatric surgeon once appointment scheduled. - Complete paperwork and medical necessity letters for bariatric surgery. - Discuss surgery options with surgeon,. - Ensure lifelong vitamin and mineral supplementation if duodenal  switch is chosen. - Coordinate with bariatric surgery team for scheduling and evaluations.  Suspected obstructive sleep apnea Under evaluation with logistical issues in scheduling sleep study. Coordination needed between bariatric and sleep medicine teams. - Ensure communication between bariatric and sleep medicine teams to avoid test duplication and ensure comprehensive care.

## 2023-10-21 NOTE — Patient Instructions (Signed)
 Obesity and bariatric surgery preparation - Finish all required paperwork and medical necessity letters for bariatric surgery. - Schedule and attend your appointment with the bariatric surgeon to discuss surgery options (sleeve gastrectomy vs. duodenal switch). - Complete all preoperative requirements, including the psychiatric evaluation. - Coordinate with the bariatric surgery team for scheduling and any additional evaluations.  Glycemic control (prediabetes) - Follow dietary and lifestyle advice from your care team.  Thyroid  and kidney function - No action needed at this time; recent labs are normal.  Suspected obstructive sleep apnea - Work with the sleep medicine team to complete your sleep study. - Make sure the bariatric and sleep medicine teams are communicating to avoid duplicate testing and to ensure all requirements are met.  Red flags - Seek care right away if you have:  - Sudden shortness of breath or chest pain - Severe swelling in your legs - Confusion or trouble waking up - Signs of very high or very low blood sugar (extreme thirst, frequent urination, confusion, shakiness, fainting)

## 2023-10-21 NOTE — Progress Notes (Signed)
     Primary Care / Sports Medicine Office Visit  Patient Information:  Patient ID: Denise Walker, female DOB: 1990-04-04 Age: 33 y.o. MRN: [id number]   GENELLA BAS is a pleasant 32 y.o. female presenting with the following:  No chief complaint on file.   Vitals:   10/21/23 1346  BP: 112/74  Pulse: 99  SpO2: 99%   Vitals:   10/21/23 1346  Weight: 219 lb (99.3 kg)  Height: 4' 11 (1.499 m)   Body mass index is 44.23 kg/m.  No results found.   Discussed the use of AI scribe software for clinical note transcription with the patient, who gave verbal consent to proceed.   Independent interpretation of notes and tests performed by another provider:   None  Procedures performed:   None  Pertinent History, Exam, Impression, and Recommendations:   Problem List Items Addressed This Visit     Obesity due to excess calories with serious comorbidity - Primary   History of Present Illness Denise Walker is a 33 year old female who presents for evaluation and preparation for bariatric surgery.  Obesity and bariatric surgery preparation - Progressive weight gain over several years - Completed bariatric surgery seminar and associated test - Filled out required paperwork and written a letter, possibly to the surgeon - Has trialed multiple weight management medications, except naltrexone - Referred to a nutritionist for dietary counseling - Currently considering sleeve gastrectomy versus duodenal switch as surgical options - Seeking additional information about surgical options - Working on completing all preoperative requirements, including psychiatric evaluation  Glycemic control - Hemoglobin A1c slightly elevated on recent laboratory testing (prediabetic level)  Thyroid  function - History of thyroid  dysfunction, now resolved - Recent laboratory studies show normal thyroid  function  Renal function - Recent laboratory studies show normal kidney  function  Sleep-disordered breathing evaluation - Possible sleep apnea under evaluation - In contact with sleep medicine specialists - Scheduling with sleep study currently being addressed  Physical Exam Vitals:   10/21/23 1346  BP: 112/74  Pulse: 99  Height: 4' 11 (1.499 m)  Weight: 219 lb (99.3 kg)  SpO2: 99%  BMI (Calculated): 44.21  CHEST: Lungs clear to auscultation bilaterally. CARDIOVASCULAR: Heart sounds normal with regular rate and rhythm.  Assessment and Plan Obesity with comorbid prediabetes and hyperlipidemia In process for scheduling with bariatric surgery, specifically considering duodenal switch and sleeve gastrectomy. Plans to discuss specifics with her bariatric surgeon once appointment scheduled. - Complete paperwork and medical necessity letters for bariatric surgery. - Discuss surgery options with surgeon,. - Ensure lifelong vitamin and mineral supplementation if duodenal switch is chosen. - Coordinate with bariatric surgery team for scheduling and evaluations.  Suspected obstructive sleep apnea Under evaluation with logistical issues in scheduling sleep study. Coordination needed between bariatric and sleep medicine teams. - Ensure communication between bariatric and sleep medicine teams to avoid test duplication and ensure comprehensive care.        Orders & Medications Medications: No orders of the defined types were placed in this encounter.  No orders of the defined types were placed in this encounter.    No follow-ups on file.     Selinda JINNY Ku, MD, [facility]   Primary Care Sports Medicine Primary Care and Sports Medicine at [facility]

## 2023-10-24 ENCOUNTER — Encounter: Payer: Self-pay | Admitting: Family Medicine

## 2023-10-27 NOTE — Telephone Encounter (Signed)
 FYI  KP

## 2023-10-28 ENCOUNTER — Ambulatory Visit: Admitting: Family Medicine

## 2023-10-28 ENCOUNTER — Encounter: Payer: Self-pay | Admitting: Family Medicine

## 2023-10-28 VITALS — BP 120/82 | HR 99 | Temp 98.1°F | Ht 59.0 in | Wt 222.3 lb

## 2023-10-28 DIAGNOSIS — K219 Gastro-esophageal reflux disease without esophagitis: Secondary | ICD-10-CM

## 2023-10-28 DIAGNOSIS — U071 COVID-19: Secondary | ICD-10-CM | POA: Insufficient documentation

## 2023-10-28 LAB — POC COVID19 BINAXNOW: SARS Coronavirus 2 Ag: POSITIVE — AB

## 2023-10-28 LAB — POCT INFLUENZA A/B
Influenza A, POC: NEGATIVE
Influenza B, POC: NEGATIVE

## 2023-10-28 MED ORDER — ONDANSETRON 4 MG PO TBDP: 4.0000 mg | ORAL_TABLET | Freq: Three times a day (TID) | ORAL | 0 refills | Status: AC | PRN

## 2023-10-28 MED ORDER — NIRMATRELVIR/RITONAVIR (PAXLOVID)TABLET: 3.0000 | ORAL_TABLET | Freq: Two times a day (BID) | ORAL | 0 refills | Status: AC

## 2023-10-28 MED ORDER — PANTOPRAZOLE SODIUM 40 MG PO TBEC: 40.0000 mg | DELAYED_RELEASE_TABLET | Freq: Every day | ORAL | 0 refills | Status: DC

## 2023-10-28 MED ORDER — PROMETHAZINE-DM 6.25-15 MG/5ML PO SYRP: 5.0000 mL | ORAL_SOLUTION | Freq: Four times a day (QID) | ORAL | 0 refills | Status: DC | PRN

## 2023-10-28 NOTE — Assessment & Plan Note (Signed)
 Gastrointestinal pain and nausea - Recurrent gallbladder-type pain with eating, located in the stomach and inside the rib area - Pain persists for a while after eating - Pain similar to previous episodes several months ago, not associated with illness at that time - No history of cholecystectomy - Nausea present - Has a few Zofran  tablets remaining from a previous prescription  Suspected gallbladder disease with epigastric pain and nausea Recurrent epigastric pain postprandial, differential includes gallstones or cholecystitis. - Ordered gallbladder ultrasound for gallstones or cholecystitis. - Prescribed pantoprazole  for acid reduction, staggered with Synthroid . Discussed short-term use due to dementia concerns. - Prescribed Zofran  for nausea. - Advised dietary modifications to reduce gallbladder stimulation. - Discussed gallbladder's role in digestion, potential gallstones, and possible removal if indicated.

## 2023-10-28 NOTE — Assessment & Plan Note (Signed)
 Denise Walker is a 33 year old female who presents with viral symptoms.  Acute viral syndrome symptoms - Onset of symptoms Sunday prior to visit - Severe chills, described as feeling extremely cold despite extra blankets and a sleeping bag - Profuse sweating, likened to post-marathon exertion - Persistent, irritating cough - Headaches, described as 'heartbeat headache' when not coughing - Stuffy nose - No sore throat - No significant breathing difficulties - Increased frequency of ibuprofen  use since symptom onset  COVID-19 infection with acute cough and associated symptoms Acute COVID-19 infection with severe symptoms, not life-threatening. - Prescribed Paxlovid  to reduce symptom duration and severity.  - Prescribed prescription cough syrup for nighttime use. - Recommended rest, hydration, OTC medications for symptom relief. - Advised Afrin for nasal congestion (max 3 days), Flonase  for ongoing relief. - Recommended vitamin D , C, zinc supplementation. - Discussed Paxcess program for Paxlovid  cost coverage. - Discussed return to activities: fever-free without medication, symptom improvement for 2 days, then 5 days mask-wearing.

## 2023-10-28 NOTE — Progress Notes (Signed)
 Primary Care / Sports Medicine Office Visit  Patient Information:  Patient ID: Denise Walker, female DOB: Nov 26, 1990 Age: 33 y.o. MRN: [id number]   Denise Walker is a pleasant 34 y.o. female presenting with the following:  Chief Complaint  Patient presents with   Abdominal Pain    Right upper quad pain since 10/24/23. Every time patient eats she gets sharp pain.    Cough    Patient started cough and congestion 10/26/23. Patient taking dayquil. Having shivers and can't get warm.     Vitals:   10/28/23 1345  BP: 120/82  Pulse: 99  Temp: 98.1 F (36.7 C)  SpO2: 99%   Vitals:   10/28/23 1345  Weight: 222 lb 4.8 oz (100.8 kg)  Height: 4' 11 (1.499 m)   Body mass index is 44.9 kg/m.  No results found.   Discussed the use of AI scribe software for clinical note transcription with the patient, who gave verbal consent to proceed.   Independent interpretation of notes and tests performed by another provider:   None  Procedures performed:   None  Pertinent History, Exam, Impression, and Recommendations:   Problem List Items Addressed This Visit     COVID-19 - Primary   Denise Walker is a 33 year old female who presents with viral symptoms.  Acute viral syndrome symptoms - Onset of symptoms Sunday prior to visit - Severe chills, described as feeling extremely cold despite extra blankets and a sleeping bag - Profuse sweating, likened to post-marathon exertion - Persistent, irritating cough - Headaches, described as 'heartbeat headache' when not coughing - Stuffy nose - No sore throat - No significant breathing difficulties - Increased frequency of ibuprofen  use since symptom onset  COVID-19 infection with acute cough and associated symptoms Acute COVID-19 infection with severe symptoms, not life-threatening. - Prescribed Paxlovid  to reduce symptom duration and severity.  - Prescribed prescription cough syrup for nighttime use. - Recommended rest,  hydration, OTC medications for symptom relief. - Advised Afrin for nasal congestion (max 3 days), Flonase  for ongoing relief. - Recommended vitamin D , C, zinc supplementation. - Discussed Paxcess program for Paxlovid  cost coverage. - Discussed return to activities: fever-free without medication, symptom improvement for 2 days, then 5 days mask-wearing.      Relevant Medications   promethazine -dextromethorphan (PROMETHAZINE -DM) 6.25-15 MG/5ML syrup   nirmatrelvir /ritonavir  (PAXLOVID ) 20 x 150 MG & 10 x 100MG  TABS   Other Relevant Orders   POC COVID-19 BinaxNow (Completed)   POCT Influenza A/B (Completed)   Gastroesophageal reflux disease without esophagitis   Gastrointestinal pain and nausea - Recurrent gallbladder-type pain with eating, located in the stomach and inside the rib area - Pain persists for a while after eating - Pain similar to previous episodes several months ago, not associated with illness at that time - No history of cholecystectomy - Nausea present - Has a few Zofran  tablets remaining from a previous prescription  Suspected gallbladder disease with epigastric pain and nausea Recurrent epigastric pain postprandial, differential includes gallstones or cholecystitis. - Ordered gallbladder ultrasound for gallstones or cholecystitis. - Prescribed pantoprazole  for acid reduction, staggered with Synthroid . Discussed short-term use due to dementia concerns. - Prescribed Zofran  for nausea. - Advised dietary modifications to reduce gallbladder stimulation. - Discussed gallbladder's role in digestion, potential gallstones, and possible removal if indicated.      Relevant Medications   pantoprazole  (PROTONIX ) 40 MG tablet   ondansetron  (ZOFRAN -ODT) 4 MG disintegrating tablet     Orders &  Medications Medications:  Meds ordered this encounter  Medications   promethazine -dextromethorphan (PROMETHAZINE -DM) 6.25-15 MG/5ML syrup    Sig: Take 5 mLs by mouth 4 (four) times  daily as needed for cough.    Dispense:  118 mL    Refill:  0   nirmatrelvir /ritonavir  (PAXLOVID ) 20 x 150 MG & 10 x 100MG  TABS    Sig: Take 3 tablets by mouth 2 (two) times daily for 5 days. (Take nirmatrelvir  150 mg two tablets twice daily for 5 days and ritonavir  100 mg one tablet twice daily for 5 days) Patient GFR is 95    Dispense:  30 tablet    Refill:  0   pantoprazole  (PROTONIX ) 40 MG tablet    Sig: Take 1 tablet (40 mg total) by mouth daily.    Dispense:  30 tablet    Refill:  0   ondansetron  (ZOFRAN -ODT) 4 MG disintegrating tablet    Sig: Take 1 tablet (4 mg total) by mouth every 8 (eight) hours as needed for up to 10 days for nausea or vomiting.    Dispense:  20 tablet    Refill:  0   Orders Placed This Encounter  Procedures   POC COVID-19 BinaxNow   POCT Influenza A/B     No follow-ups on file.     Selinda JINNY Ku, MD, [facility]   Primary Care Sports Medicine Primary Care and Sports Medicine at [facility]

## 2023-10-28 NOTE — Patient Instructions (Signed)
 Patient Plan  COVID-19 Infection  - Take Paxlovid  as prescribed to reduce symptom duration and severity. - Use prescription cough syrup at night as needed for cough. - Rest and stay hydrated. - Use over-the-counter medications for symptom relief as needed. - Use Afrin for nasal congestion (no more than 3 days). - Use Flonase  for ongoing nasal congestion relief. - Take vitamin D , vitamin C, and zinc supplements as recommended. - Return to activities only when you are fever-free without medication and symptoms have improved for at least 2 days, then wear a mask for 5 days.  Gastroesophageal Reflux Disease and Suspected Gallbladder Disease  - Gallbladder ultrasound has been ordered to check for gallstones or cholecystitis. - Take pantoprazole  (Protonix ) for acid reduction, staggered with Synthroid . - Use Zofran  as needed for nausea. - Follow dietary modifications to reduce gallbladder stimulation (avoid fatty, greasy, or spicy foods). - Monitor for any changes in pain or new symptoms.  Red flags - seek care if you notice:  - Difficulty breathing, chest pain, or severe shortness of breath - Persistent high fever or confusion - Severe abdominal pain, persistent vomiting, or yellowing of the skin or eyes - Blood in vomit or stool - New or worsening symptoms that concern you

## 2023-11-10 ENCOUNTER — Other Ambulatory Visit: Payer: Self-pay | Admitting: Family Medicine

## 2023-11-10 DIAGNOSIS — E038 Other specified hypothyroidism: Secondary | ICD-10-CM

## 2023-11-11 ENCOUNTER — Encounter: Payer: Self-pay | Admitting: Family Medicine

## 2023-11-11 NOTE — Telephone Encounter (Signed)
 Requested Prescriptions  Pending Prescriptions Disp Refills   levothyroxine  (SYNTHROID ) 75 MCG tablet [Pharmacy Med Name: LEVOTHYROXINE  75 MCG TABLET] 90 tablet 0    Sig: TAKE 1 TABLET BY MOUTH EVERY DAY     Endocrinology:  Hypothyroid Agents Passed - 11/11/2023  4:27 PM      Passed - TSH in normal range and within 360 days    TSH  Date Value Ref Range Status  10/06/2023 1.750 0.450 - 4.500 uIU/mL Final         Passed - Valid encounter within last 12 months    Recent Outpatient Visits           2 weeks ago COVID-19   [facility] Primary Care & Sports Medicine at [facility], Selinda PARAS, MD   3 weeks ago Class 3 severe obesity due to excess calories with serious comorbidity and body mass index (BMI) of 40.0 to 44.9 in adult   [facility] Primary Care & Sports Medicine at [facility], Selinda PARAS, MD   1 month ago Healthcare maintenance   [facility] Primary Care & Sports Medicine at [facility], Selinda PARAS, MD   2 months ago Healthcare maintenance   [facility] Primary Care & Sports Medicine at [facility], Selinda PARAS, MD   3 months ago Cellulitis of right upper extremity   [place] Primary Care & Sports Medicine at [facility], MD       Future Appointments             In 3 weeks Ku, Selinda PARAS, MD [facility] Primary Care & Sports Medicine at [facility], [place]   In 11 months Ku, Selinda PARAS, MD [facility] Primary Care & Sports Medicine at [facility], [phone] [place]

## 2023-11-12 ENCOUNTER — Other Ambulatory Visit: Payer: Self-pay | Admitting: Family Medicine

## 2023-11-12 DIAGNOSIS — R1011 Right upper quadrant pain: Secondary | ICD-10-CM

## 2023-11-13 ENCOUNTER — Ambulatory Visit
Admission: RE | Admit: 2023-11-13 | Discharge: 2023-11-13 | Disposition: A | Discharge status: Home / Self Care | Source: Ambulatory Visit | Attending: Family Medicine | Admitting: Family Medicine

## 2023-11-13 ENCOUNTER — Ambulatory Visit: Payer: Self-pay | Admitting: Family Medicine

## 2023-11-13 DIAGNOSIS — R1011 Right upper quadrant pain: Secondary | ICD-10-CM | POA: Diagnosis present

## 2023-11-13 NOTE — Telephone Encounter (Signed)
 FYI  KP

## 2023-11-21 ENCOUNTER — Other Ambulatory Visit: Payer: Self-pay | Admitting: Family Medicine

## 2023-11-21 DIAGNOSIS — K219 Gastro-esophageal reflux disease without esophagitis: Secondary | ICD-10-CM

## 2023-11-24 NOTE — Telephone Encounter (Signed)
 Requested Prescriptions  Pending Prescriptions Disp Refills   pantoprazole  (PROTONIX ) 40 MG tablet [Pharmacy Med Name: PANTOPRAZOLE  SOD DR 40 MG TAB] 90 tablet 0    Sig: TAKE 1 TABLET BY MOUTH EVERY DAY     Gastroenterology: Proton Pump Inhibitors Passed - 11/24/2023  3:21 PM      Passed - Valid encounter within last 12 months    Recent Outpatient Visits           3 weeks ago COVID-19   [facility] Primary Care & Sports Medicine at [facility], Selinda PARAS, MD   1 month ago Class 3 severe obesity due to excess calories with serious comorbidity and body mass index (BMI) of 40.0 to 44.9 in adult   [facility] Primary Care & Sports Medicine at [facility], Selinda PARAS, MD   1 month ago Healthcare maintenance   [facility] Primary Care & Sports Medicine at [facility], Selinda PARAS, MD   2 months ago Healthcare maintenance   [facility] Primary Care & Sports Medicine at [facility], Selinda PARAS, MD   3 months ago Cellulitis of right upper extremity    Primary Care & Sports Medicine at [facility], Harlene, MD       Future Appointments             In 1 week Ku, Selinda PARAS, MD [facility] Primary Care & Sports Medicine at [facility], [place]   In 10 months Ku, Selinda PARAS, MD [facility] Primary Care & Sports Medicine at [facility], [phone] [place]

## 2023-11-27 DIAGNOSIS — Z7189 Other specified counseling: Secondary | ICD-10-CM | POA: Diagnosis not present

## 2023-11-27 DIAGNOSIS — Z79899 Other long term (current) drug therapy: Secondary | ICD-10-CM | POA: Diagnosis not present

## 2023-11-27 DIAGNOSIS — Z8659 Personal history of other mental and behavioral disorders: Secondary | ICD-10-CM | POA: Diagnosis not present

## 2023-11-27 DIAGNOSIS — E038 Other specified hypothyroidism: Secondary | ICD-10-CM | POA: Diagnosis not present

## 2023-11-27 DIAGNOSIS — Z6841 Body Mass Index (BMI) 40.0 and over, adult: Secondary | ICD-10-CM | POA: Diagnosis not present

## 2023-11-27 DIAGNOSIS — Z713 Dietary counseling and surveillance: Secondary | ICD-10-CM | POA: Diagnosis not present

## 2023-11-27 DIAGNOSIS — E782 Mixed hyperlipidemia: Secondary | ICD-10-CM | POA: Diagnosis not present

## 2023-11-27 DIAGNOSIS — E282 Polycystic ovarian syndrome: Secondary | ICD-10-CM | POA: Diagnosis not present

## 2023-11-27 DIAGNOSIS — R7303 Prediabetes: Secondary | ICD-10-CM | POA: Diagnosis not present

## 2023-11-27 DIAGNOSIS — E559 Vitamin D deficiency, unspecified: Secondary | ICD-10-CM | POA: Diagnosis not present

## 2023-11-27 DIAGNOSIS — F4312 Post-traumatic stress disorder, chronic: Secondary | ICD-10-CM | POA: Diagnosis not present

## 2023-11-28 ENCOUNTER — Ambulatory Visit: Payer: Self-pay | Admitting: Surgery

## 2023-12-04 ENCOUNTER — Other Ambulatory Visit: Payer: Self-pay | Admitting: Surgery

## 2023-12-04 ENCOUNTER — Telehealth: Payer: Self-pay

## 2023-12-04 NOTE — Telephone Encounter (Signed)
 Please send over patient OV notes. Thank you.  Kindly,  Tallia Moehring

## 2023-12-04 NOTE — Telephone Encounter (Signed)
 Copied from CRM [id number]. Topic: General - Other >> Dec 04, 2023 10:27 AM Sophia H wrote: Reason for CRM:  **[place] [facility] Bariatric surgery for patient, states patient advised she completed a few weight loss management/weight loss medications visits with PCP, wanting to know if OV notes can be sent over to be reviewed as a part of patients pathway.    Fax # [phone], Any questions please reach out to [place] / direct line - [phone].

## 2023-12-05 ENCOUNTER — Ambulatory Visit: Admitting: Family Medicine

## 2023-12-18 ENCOUNTER — Ambulatory Visit: Admitting: Family Medicine

## 2023-12-18 ENCOUNTER — Other Ambulatory Visit (INDEPENDENT_AMBULATORY_CARE_PROVIDER_SITE_OTHER): Payer: Self-pay | Admitting: Radiology

## 2023-12-18 ENCOUNTER — Ambulatory Visit
Admission: RE | Admit: 2023-12-18 | Discharge: 2023-12-18 | Disposition: A | Source: Ambulatory Visit | Attending: Family Medicine

## 2023-12-18 ENCOUNTER — Ambulatory Visit
Admission: RE | Admit: 2023-12-18 | Discharge: 2023-12-18 | Disposition: A | Discharge status: Home / Self Care | Attending: Family Medicine | Admitting: Family Medicine

## 2023-12-18 ENCOUNTER — Encounter: Payer: Self-pay | Admitting: Family Medicine

## 2023-12-18 ENCOUNTER — Telehealth: Payer: Self-pay

## 2023-12-18 VITALS — BP 117/68 | HR 98 | Ht 59.0 in | Wt 223.0 lb

## 2023-12-18 DIAGNOSIS — M25562 Pain in left knee: Secondary | ICD-10-CM

## 2023-12-18 MED ORDER — TRIAMCINOLONE ACETONIDE 40 MG/ML IJ SUSP
40.0000 mg | Freq: Once | INTRAMUSCULAR | Status: AC
  Administered 2023-12-18: 40 mg via INTRAMUSCULAR

## 2023-12-18 NOTE — Telephone Encounter (Signed)
 Patient has appt

## 2023-12-18 NOTE — Telephone Encounter (Signed)
Sent pt a message on Mychart.  KP

## 2023-12-18 NOTE — Telephone Encounter (Signed)
 Copied from CRM [id number]. Topic: Clinical - Medical Advice >> Dec 18, 2023  8:40 AM Montie POUR wrote: Reason for CRM:  [name] made an appointment for today and wants to make sure she can have a cortisone shot. Please call her back at [phone] before her appointment. Thanks

## 2023-12-18 NOTE — Patient Instructions (Addendum)
 You have just been given a cortisone injection to reduce pain and inflammation. After the injection you may notice immediate relief of pain as a result of the Lidocaine . It is important to rest the area of the injection for 24 to 48 hours after the injection. There is a possibility of some temporary increased discomfort and swelling for up to 72 hours until the cortisone begins to work. If you do have pain, simply rest the joint and use ice. If you can tolerate over the counter medications, you can try Tylenol , Aleve, or Advil  for added relief per package instructions.   LEFT KNEE PAIN: You have chronic left knee pain that worsened after a recent minor injury. You previously had relief from a cortisone injection. -You received another cortisone injection today. -We ordered an x-ray of your left knee to check for any wear and tear and to help with potential gel injection coverage. -Use ice and rest your knee over the weekend. -You can take Tylenol  for pain management.

## 2023-12-18 NOTE — Assessment & Plan Note (Signed)
 History of Present Illness Denise Walker is a 33 year old female who presents with left knee pain.  Left knee pain - Onset in April - Pain recurred approximately three weeks ago after a tripping incident while pumping gas - Current pain is less severe than previous episodes - Previously received a cortisone injection with relief lasting about four months - Concerned about diminishing effectiveness of corticosteroid injections - No current use of pain medications due to concerns about gastrointestinal side effects - Avoiding NSAIDs in preparation for bariatric surgery  Physical Exam INSPECTION: Tenderness at the lateral and medial joint lines and patellar tendon of the knee.  Assessment and Plan Left knee pain Acute on chronic left knee pain exacerbated by minor trauma. Primary tenderness at lateral joint line, secondary at patellar tendon. Previous cortisone injection effective for four months. Discussed gel injections as a longer-term solution, acting as a lubricant and potentially reducing cortisone need. - Administered cortisone injection to left knee. - Ordered x-ray of left knee to assess for degenerative changes and facilitate potential gel injection coverage. - Advised use of ice and rest over the weekend. - Recommended Tylenol  for pain management, with ibuprofen  as a secondary option under supervision.

## 2023-12-18 NOTE — Progress Notes (Signed)
     Primary Care / Sports Medicine Office Visit  Patient Information:  Patient ID: Denise Walker, female DOB: Sep 09, 1990 Age: 33 y.o. MRN: [id number]   Denise Walker is a pleasant 33 y.o. female presenting with the following:  Chief Complaint  Patient presents with   Knee Pain    Left knee pain x 3 weeks, cortisone injection requested. Patient had significant rleief from her last injection.    Vitals:   12/18/23 1425  BP: 117/68  Pulse: 98  SpO2: 98%   Vitals:   12/18/23 1425  Weight: 223 lb (101.2 kg)  Height: 4' 11 (1.499 m)   Body mass index is 45.04 kg/m.  No results found.   Discussed the use of AI scribe software for clinical note transcription with the patient, who gave verbal consent to proceed.   Independent interpretation of notes and tests performed by another provider:   None  Procedures performed:   Procedure:  Injection of left knee under ultrasound guidance. Ultrasound guidance utilized for anterolateral approach, joint space visualized Samsung HS60 device utilized with permanent recording / reporting. Verbal informed consent obtained and verified. Skin prepped in a sterile fashion. Ethyl chloride for topical local analgesia.  Completed without difficulty and tolerated well. Medication: triamcinolone  acetonide 40 mg/mL suspension for injection 1 mL total and 2 mL lidocaine  1% without epinephrine utilized for needle placement anesthetic Advised to contact for fevers/chills, erythema, induration, drainage, or persistent bleeding.   Pertinent History, Exam, Impression, and Recommendations:   Problem List Items Addressed This Visit     Arthralgia of left knee - Primary   History of Present Illness Denise Walker is a 33 year old female who presents with left knee pain.  Left knee pain - Onset in April - Pain recurred approximately three weeks ago after a tripping incident while pumping gas - Current pain is less severe than previous  episodes - Previously received a cortisone injection with relief lasting about four months - Concerned about diminishing effectiveness of corticosteroid injections - No current use of pain medications due to concerns about gastrointestinal side effects - Avoiding NSAIDs in preparation for bariatric surgery  Physical Exam INSPECTION: Tenderness at the lateral and medial joint lines and patellar tendon of the knee.  Assessment and Plan Left knee pain Acute on chronic left knee pain exacerbated by minor trauma. Primary tenderness at lateral joint line, secondary at patellar tendon. Previous cortisone injection effective for four months. Discussed gel injections as a longer-term solution, acting as a lubricant and potentially reducing cortisone need. - Administered cortisone injection to left knee. - Ordered x-ray of left knee to assess for degenerative changes and facilitate potential gel injection coverage. - Advised use of ice and rest over the weekend. - Recommended Tylenol  for pain management, with ibuprofen  as a secondary option under supervision.      Relevant Orders   US  LIMITED JOINT SPACE STRUCTURES LOW LEFT   DG Knee Complete 4 Views Left     Orders & Medications Medications:  Meds ordered this encounter  Medications   triamcinolone  acetonide (KENALOG -40) injection 40 mg   Orders Placed This Encounter  Procedures   US  LIMITED JOINT SPACE STRUCTURES LOW LEFT   DG Knee Complete 4 Views Left     Return if symptoms worsen or fail to improve.     Selinda JINNY Ku, MD, [facility]   Primary Care Sports Medicine Primary Care and Sports Medicine at [facility]

## 2023-12-25 ENCOUNTER — Other Ambulatory Visit: Payer: Self-pay | Admitting: Family Medicine

## 2023-12-25 DIAGNOSIS — K219 Gastro-esophageal reflux disease without esophagitis: Secondary | ICD-10-CM

## 2023-12-27 NOTE — Telephone Encounter (Signed)
 Requested Prescriptions  Pending Prescriptions Disp Refills   pantoprazole  (PROTONIX ) 40 MG tablet [Pharmacy Med Name: PANTOPRAZOLE  SOD DR 40 MG TAB] 90 tablet 2    Sig: TAKE 1 TABLET BY MOUTH EVERY DAY     Gastroenterology: Proton Pump Inhibitors Passed - 12/27/2023  9:21 AM      Passed - Valid encounter within last 12 months    Recent Outpatient Visits           1 week ago Arthralgia of left knee   [facility], Selinda PARAS, MD   2 months ago COVID-19   [facility] at [facility], Selinda PARAS, MD   2 months ago Class 3 severe obesity due to excess calories with serious comorbidity and body mass index (BMI) of 40.0 to 44.9 in adult   [facility] at [facility], Selinda PARAS, MD   2 months ago Healthcare maintenance   [facility] at [facility], Selinda PARAS, MD   3 months ago Healthcare maintenance   [facility] at [facility], Selinda PARAS, MD       Future Appointments             In 9 months Ku, Selinda PARAS, MD [facility] at [facility], [phone] [place]

## 2023-12-29 ENCOUNTER — Encounter: Attending: Surgery | Admitting: Dietician

## 2023-12-29 ENCOUNTER — Encounter: Payer: Self-pay | Admitting: Dietician

## 2023-12-29 VITALS — Ht 59.0 in | Wt 222.6 lb

## 2023-12-29 DIAGNOSIS — E66813 Obesity, class 3: Secondary | ICD-10-CM

## 2023-12-29 DIAGNOSIS — E282 Polycystic ovarian syndrome: Secondary | ICD-10-CM

## 2023-12-29 DIAGNOSIS — Z713 Dietary counseling and surveillance: Secondary | ICD-10-CM | POA: Diagnosis present

## 2023-12-29 DIAGNOSIS — R7303 Prediabetes: Secondary | ICD-10-CM

## 2023-12-29 NOTE — Progress Notes (Signed)
 Nutrition Assessment for Bariatric Surgery   Appointment start time: 1300   end time: 1400  Planned Surgery: duodenal switch  Anthropometrics: Weight: 222.6lbs Height: 4'11 BMI: 44.96     Clinical: Medical History: PCOS, hypothyroidism, vitamin D  deficiency, GERD Medications and Supplements: reconciled list in medical record Relevant labs: HbA1C 5.8% 09/03/23; total cholesterol 174, HDL 43, LDL 89, TG 133 Notable symptoms: chronic leg pain; low energy Drug allergies: rizatriptan, trazodone. nefazodone Food allergies: fish and shellfish  Lifestyle and Dietary History:  Dieting/ weight history:  Recently tried semaglutide  had severe reaction went to ED; had nausea with phentermine  combo medication  Tried wellbutrin  without success Short-term success with low carb eating.  Intermittent fasting resulted in adverse effect of low BG symptoms Lack of success with meds and diets led to decision for surgery -- wants the help with making more permanent diet changes.    Disordered or emotional eating history:  No eating disorder diagnosis No significant emotional eating  Physical activity: care for and play with 3 children in the home; plans for active time with kids  Dietary Recall:  Daily pattern: 2-3 meals and 2 snacks. Dining out: 1-2 meals per week. Breakfast: sometimes skips; or frozen breakfast biscuit; biscuit and gravy on weekends; breakfast burrito with egg, sausage, taco sauce  Snack: occasionally granola bar/ ginger snaps/ fruit ie apple Lunch: often largest meal -- leftovers/ bagel bites (eats with children)/ frozen meal/ frozen burrito Snack: none or same as am Supper: tacos/ spaghetti/ pork chops + veg/ other sides; often no energy for cooking so meals are quick, often from frozen or box Snack: none or same as am Beverages: water with sugar free flavoring; 1c. Coffee with milk and sm amt sugar 4-5x a week; has stopped sodas   Nutrition Intervention: Instructed patient  on pre-op diet goals and importance of close adherence to bariatric diet after surgery to avoid side effects and complications.  Discussed stages of the bariatric diet after surgery as well as the importance of adequate protein and fluid intake.  Provided overview of 2-week pre-op diet.  Instructed on post-op vitamin supplement needs Has support from spouse, and a friend who had same procedure 8 years ago and has successfully maintained weight loss  Nutrition Diagnosis: Stewart-3.3 Overweight/ obesity related to history of PCOS, hypothyroidism, excess calories and inadequate physical activity as evidenced by patient with current BMI of 44.96, following dietary guidelines for weight loss prior to bariatric surgery.  Teaching method(s) utilized: Risk Manager provided: Preparing for Bariatric Surgery packet   Learning Readiness: Change in progress  Barriers to learning/ implementing lifestyle change: none  Demonstrated degree of understanding via: Teach Back  Summary: Patient has begun making diet and lifestyle changes in effort to lose weight and prepare for bariatric surgery.  Patient's goal for weight loss is improved stamina to stay active with her family. She has solid support from family and friend.  She agrees to work on consolidated edison and balance of nutrients in meals prior to surgery.  She voices understanding of, and is motivated to follow the bariatric diet after surgery.  From a nutrition standpoint, she is ready to proceed with the bariatric surgery program.    Plan: Patient commits to returning for  pre-op class/ visit at least 2 weeks prior to surgery.  She will plan to return for post-op RD visits beginning 2 weeks after surgery.

## 2023-12-29 NOTE — Patient Instructions (Signed)
 Work on plans for suitable protein shake/ drink after surgery (and for pre-op diet) Look for simple and quick and healthy meal ideas that will work for the family and the post-op diet.

## 2023-12-30 ENCOUNTER — Ambulatory Visit (INDEPENDENT_AMBULATORY_CARE_PROVIDER_SITE_OTHER): Admitting: Licensed Clinical Social Worker

## 2023-12-30 DIAGNOSIS — F4320 Adjustment disorder, unspecified: Secondary | ICD-10-CM | POA: Diagnosis not present

## 2023-12-30 NOTE — Progress Notes (Unsigned)
 Virtual Visit via Video Note  I connected with Denise Walker on 12/30/23 at  5:00 PM EST by a video enabled telemedicine application and verified that I am speaking with the correct person using two identifiers.  Location: Patient: Primary residence, [place] Provider: Clinician virtual office, [place]   I discussed the limitations of evaluation and management by telemedicine and the availability of in person appointments. The patient expressed understanding and agreed to proceed.   I discussed the assessment and treatment plan with the patient. The patient was provided an opportunity to ask questions and all were answered. The patient agreed with the plan and demonstrated an understanding of the instructions.   Comprehensive Clinical Assessment (CCA) Note  12/30/2023 [name] [id number]  Chief Complaint:  Chief Complaint  Patient presents with   BARIATRIC SCREENING   Visit Diagnosis:  Encounter Diagnosis  Name Primary?   Adjustment disorder, unspecified type Yes    Disposition:  Clinician sees no significant psychological factors that would hinder the success of bariatric surgery at time of assessment. Clinician supports patient candidacy for Bariatric Surgery.   Patient reports realistic expectations post surgery, is aware of the pre and post surgical process, client reports that behavioral health diagnosis(es) are stable at time of assessment, client reports positive pre and post surgical support system, and client reports motivation to make positive change.    CCA Biopsychosocial Intake/Chief Complaint:  BARIATRIC SCREENING  Current Symptoms/Problems: Denise Walker is a 33 y.o. year old adult patient reporting to [facility] for preliminary screening to determine bariatric surgery eligibility. Patient reports that they have tried several weight loss interventions in the past, including phentermine , topamax , wegovy  (bad side effects).  Pt reports that she has talked with  individuals that have had weight loss surgery--friend had surgery 8 years ago.   Denise Walker reports current medical concerns/medical history of thyroid  concerns (takes thyroid  medication).and gall bladder issues previously.  Patient reports previous history of depression, anxiety managed by Dr. Maryellen Barth, but pt reports that she is in a good place in life currently and all symptoms are managed without medication and/or therapy.SABRA Denise Walker denies SI, HI, or perceptual disturbances at time of assessment. Patient denies substance use issues at time of assessment. Denise Walker  reports that they are motivated to make positive changes to contribute to improved wellness and are seeking bariatric weight loss surgery as an intervention to support wellness goals.   Patient Reported Schizophrenia/Schizoaffective Diagnosis in Past: No   Strengths: Patient reports that she is motivated to make positive change, patient has good insight/awareness, patient has good problem-solving skills, patient is committed to treatment goals  Preferences: Due to unsuccessful weight loss interventions in the past, patient seeking bariatric weight loss surgery  Abilities: Patient has the ability to be compliant with interventions, patient has ability to use coping skills, patient has the ability to understand the overall impact of surgery, patient has ability to regulate self and emotions   Type of Services Patient Feels are Needed: Patient seeking bariatric weight loss surgery   Initial Clinical Notes/Concerns: Pt reports that all symptoms of anxiety/depression are managed well currently   Mental Health Symptoms Depression:  Difficulty Concentrating   Duration of Depressive symptoms: Greater than two weeks   Mania:  None   Anxiety:   Difficulty concentrating; Worrying (situationally worrying about children)   Psychosis:  None   Duration of Psychotic symptoms: No data recorded  Trauma:  None  Obsessions:  None   Compulsions:  None   Inattention:  Symptoms present in 2 or more settings (Pt has been treated in the past)   Hyperactivity/Impulsivity:  Several symptoms present in 2 of more settings   Oppositional/Defiant Behaviors:  None   Emotional Irregularity:  None   Other Mood/Personality Symptoms:  Pt reports that overall mood is stable.    Mental Status Exam Appearance and self-care  Stature:  Average   Weight:  Obese   Clothing:  Neat/clean   Grooming:  Normal   Cosmetic use:  Age appropriate   Posture/gait:  Normal   Motor activity:  Not Remarkable   Sensorium  Attention:  Normal   Concentration:  Normal   Orientation:  X5   Recall/memory:  Normal   Affect and Mood  Affect:  Appropriate   Mood:  Other (Comment) (within normal limits)   Relating  Eye contact:  Normal   Facial expression:  Responsive   Attitude toward examiner:  Cooperative   Thought and Language  Speech flow: Clear and Coherent   Thought content:  Appropriate to Mood and Circumstances   Preoccupation:  None   Hallucinations:  None   Organization:  No data recorded  [employer] of Knowledge:  Good   Intelligence:  Average   Abstraction:  Normal   Judgement:  Good   Reality Testing:  Realistic   Insight:  Good   Decision Making:  Normal   Social Functioning  Social Maturity:  Responsible   Social Judgement:  Normal   Stress  Stressors:  Family conflict; Grief/losses; Transitions (loss of a pet (29 year old). pt and husband started company 2 years ago.)   Coping Ability:  Normal   Skill Deficits:  None   Supports:  Friends/Service system; Family (two friends that are very close to pt)     Religion: Religion/Spirituality How Might This Affect Treatment?: no religious barriers to treatment  Leisure/Recreation: Leisure / Recreation Do You Have Hobbies?: Yes Leisure and Hobbies: christmas activities, festivals, vendor events,  auctions, traveling, museums, enjoys arts/crafts, pt enjoys Tree Surgeon: Exercise/Diet Do You Exercise?: Yes What Type of Exercise Do You Do?: Run/Walk, Other (Comment) (active with children) How Many Times a Week Do You Exercise?: 6-7 times a week Have You Gained or Lost A Significant Amount of Weight in the Past Six Months?: No Do You Follow a Special Diet?: Yes Type of Diet: limiting sodas, drinking more water, paying attention to fat intake. starting nutritional counseling currently. 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?: No  Education: Education Is Patient Currently Attending School?: No Last Grade Completed: 12 Did Garment/textile Technologist From [employer]?: Yes Did Theme Park Manager?: No Did Designer, Television/film Set?: No Did You Have An Individualized Education Program (IIEP): No Did You Have Any Difficulty At [employer]?: No Patient's Education Has Been Impacted by Current Illness: No   CCA Family/Childhood History Family and Relationship History: Family history Marital status: Married Number of Years Married: 12 What types of issues is patient dealing with in the relationship?: none Additional relationship information: Married for 12 years Does patient have children?: Yes How many children?: 3 How is patient's relationship with their children?: Ages: 35, 5, 4  Childhood History:  Childhood History By whom was/is the patient raised?: Both parents Additional childhood history information: pt reports that she lived with parents--mopther passed with cancer at age 65.  father remarried at age 44--close with stepmom.  father divorced stepmom and he remarried one year ago. Close with new  stepmom.father moved 3.5 hours away. Description of patient's relationship with caregiver when they were a child: Pt reports positive relationshipws with caregivers as a child Patient's description of current relationship with people who raised him/her: Pt reports positive relationships with caregivers as an adult How were you disciplined when you got in trouble as a child/adolescent?: fair Does patient have siblings?: Yes Number of Siblings: 2 Description of patient's current relationship with siblings: older brother--he never lived at home with pt--he was 14 years older than pt. younger brother 73 years younger than me in college. Pt reports positive relationships with both brothers Did patient suffer any verbal/emotional/physical/sexual abuse as a child?: No Did patient suffer from severe childhood neglect?: No Has patient ever been sexually abused/assaulted/raped as an adolescent or adult?: No Was the patient ever a victim of a crime or a disaster?: No Witnessed domestic violence?: No Has patient been affected by domestic violence as an adult?: No  Child/Adolescent Assessment:     CCA Substance Use Alcohol/Drug Use: Alcohol / Drug Use Pain Medications: SEE MAR Prescriptions: SEE MAR Over the Counter: SEE MAR History of alcohol / drug use?: No history of alcohol / drug abuse Longest period of sobriety (when/how long): RARE ETOH--ONLY 2-3 TIMES PER YEAR Negative Consequences of Use:  (NONE) Withdrawal Symptoms: None    ASAM's:  Six Dimensions of Multidimensional Assessment  Dimension 1:  Acute Intoxication and/or Withdrawal Potential:   Dimension 1:  Description of individual's past and current experiences of substance use and withdrawal: RARE ETOH  Dimension 2:  Biomedical Conditions and Complications:   Dimension 2:  Description of patient's biomedical conditions and  complications: NONE  Dimension 3:  Emotional, Behavioral, or Cognitive Conditions and Complications:   Dimension 3:  Description of emotional, behavioral, or cognitive conditions and complications: NONE  Dimension 4:  Readiness to Change:  Dimension 4:  Description of Readiness to Change criteria: NONE  Dimension 5:  Relapse, Continued use, or Continued Problem Potential:  Dimension 5:  Relapse, continued use, or continued problem potential critiera description: NONE  Dimension 6:  Recovery/Living Environment:  Dimension 6:  Recovery/Iiving environment criteria description: NONE  ASAM Severity Score: ASAM's Severity Rating Score: 0  ASAM Recommended Level of Treatment: ASAM Recommended Level of Treatment: Level I Outpatient Treatment   Substance use Disorder (SUD) Substance Use Disorder (SUD)  Checklist Symptoms of Substance Use:  (NONE)  Recommendations for Services/Supports/Treatments: Recommendations for Services/Supports/Treatments Recommendations For Services/Supports/Treatments: Individual Therapy  DSM5 Diagnoses: Patient Active Problem List   Diagnosis Date Noted   COVID-19 10/28/2023   Healthcare maintenance 10/06/2023   Subclinical hypothyroidism 10/06/2023   Flexural eczema 09/06/2023   Arthralgia of left knee 06/04/2023   Chronic constipation 01/02/2023   Allergic rhinitis 01/02/2023   Excessive daytime sleepiness 11/18/2022   Vitamin D  deficiency 11/18/2022   Gastroesophageal reflux disease without esophagitis 06/02/2022   Recurrent sinusitis 04/19/2022   Obesity due to excess calories with serious comorbidity 03/21/2022   Mixed hyperlipidemia 06/04/2021   Prediabetes 06/04/2021   PCOS (polycystic ovarian syndrome) 05/01/2021   Chronic pain of right knee 05/01/2021   Chronic foot pain, left 05/01/2021   Chronic post-traumatic stress disorder (PTSD) 12/21/2020   Insomnia due to mental disorder  09/01/2020   ADHD (attention deficit hyperactivity disorder), 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 Date of Interview:  12/31/23 Gender:  F   Date of Report : 12/31/23 Purpose:   Bariatric/Weight-loss Surgery (pre-operative evaluation)    Assessment Instruments:  DSM-5-TR Self-Rated Level 1 Cross-Cutting Symptom Measure--Adult Severity Measure for Generalized Anxiety Disorder--Adult EAT-26 (Eating Attitudes Test) SSS-8 (Somatic Symptom Scale) BES (Binge Eating Scale)  Chief Complaint: BARIATRIC SCREENING  Client Background: Patient is a 33 yo female seeking weight loss surgery. Patient has high school education and is currently working as nurse, mental health.  Patients marital status is married. The patient is 4 feet  11 inches tall and 222 lbs., reflecting a BMI of 44.96 classifying patient in the obese range and at further risk of co-morbid diseases.  Tobacco Use: Patient denies tobacco use.   PATIENT BEHAVIORAL ASSESSMENT SCORES  Personal History of Mental Illness: Patient reports that she has a history of postpartum depression and anxiety and was hospitalized in October 2016. Pt reports that she was on medication and therapy for years, and pt has been off medication for years and feels that her symptoms have stabilized.    Mental Status Examination: Patient was oriented x5 (person, place, situation, time, and object). Patient was appropriately groomed, and neatly dressed. Patient was alert, engaged, pleasant, and cooperative. Patient denies suicidal and homicidal ideations or any perceptual disturbances. Patient denies self-injury.   DSM-5-TR Self-Rated Level 1 Cross-Cutting Symptom Measure--Adult: Patient completed 23-item questionnaire assessing symptoms related to depression, anger, mania, anxiety,  somatic symptoms, suicidal ideation, psychosis, insomnia, memory concerns, repetitive behaviors, dissociation, personality functioning and substance use. Denise Walker scored 3 in anxiety domain.   Severity Measure for Generalized Anxiety Disorder--Adult: Patient completed a 10-item  scale. Total scores can range from 0 to 40. A raw score is calculated by summing the answer to each question, and an average total score is achieved by dividing the raw score by the number of items (e.g., 10) Raw-T score scoring conversion: 7-15 none to slight, 16-19 mild, 20-27 moderate, 28+ severe.  Diamond JONETTA Walker had a total raw score of 2 out of 40 which indicates none to slight anxiety.   EAT-26: The EAT-26 is a twenty-six-question screening tool to identify symptoms of dieting behaviors, bulimia, food preoccupation and oral control.  Scores below a 20 are considered not meeting criteria for disordered eating. Denise Walker scored 1 out of 26. Patient denies inducing vomiting, or intentional meal skipping. Patient denies binge eating behaviors. Patient denies laxative abuse. Patient does not meet criteria for a DSM-V eating disorder.  SSS-8: The SSS-8, or Somatic Symptom Scale-8, is a brief self-report questionnaire used to assess the perceived burden of common somatic (physical) symptoms.  (SSS-8) is scored by summing the responses to eight items, each rated on a 5-point Likert scale from 0 (Not at all) to 4 (Very much). Total scores range from 0 to 32, with higher scores indicating greater somatic symptom burden. 0-3 indicate minimal burden, 4-7 indicate low burden, 8-11 indicate medium burden, 12-15 indicate high burden, 16-32 indicate very high burden.  Denise Walker scored 3 out of 32, which indicates minimal burden score.  PT REPORTS SOME BACK PAIN AND KNEE PAIN.  BES: The Binge Eating Scale (BES) is a self-report questionnaire developed to assess the presence and severity of binge eating behavior,  particularly in individuals who may be struggling with obesity or disordered eating patterns.  Possible total scores range from 0 to 32 with higher scores indicating more severe binge eating symptoms. Based on the BES total score, individuals can be categorized into three groups according to established cut-scores of binge eating severity.  These groups are characterized by no binge eating (score <= 17), mild to moderate binge eating (score of 18 - 26) and severe binge eating (score >= 27). A frequent convention is to use the BES as a screening measure to classify all participants with scores greater than or equal to 17 as "binge eaters.SABRA Denise JONETTA Tod scored 0 out of 32, which indicates no binge eating score.   Conclusion & Recommendations:   Health history and current assessment reflect that patient is suitable to be a candidate for bariatric surgery. Patient understands the procedure, the risks associated with it, and the importance of post-operative holistic care (Physical, spiritual/values, relationships, and mental/emotional health) with access to resources for support as needed. The patient has made an informed decision to proceed with procedure. The patient is motivated and expressed understanding of the post-surgical requirements. Patient's psychological assessment will be valid from today's date for 6 months (06/28/24). After that date, a follow-up appointment will be needed to re-evaluate the patient's psychological status.   Clinician sees no significant psychological factors that would hinder the success of bariatric surgery at time of assessment. Clinician supports patient candidacy for Bariatric Surgery.   Tawni JONELLE Brisker, MSW, LCSW Licensed Clinical Social [facility]     Referrals to Alternative Service(s): Referred to Alternative Service(s):   Place:   Date:   Time:    Referred to Alternative Service(s):   Place:   Date:   Time:    Referred to  Alternative Service(s):   Place:   Date:   Time:    Referred to Alternative Service(s):   Place:   Date:   Time:      Collaboration of Care: Other patient encouraged to follow ongoing recommendations of bariatric team providers.  Patient encouraged to seek psychotherapy services as needed.  Patient/Guardian was advised Release of Information must be obtained prior to any record release in order to collaborate their care with an outside provider. Patient/Guardian was advised if they have not already done so to contact the registration department to sign all necessary forms in order for us  to release information regarding their care.   Consent: Patient/Guardian gives verbal consent for treatment and assignment of benefits for services provided during this visit. Patient/Guardian expressed understanding and agreed to proceed.   Malyssa Maris R Spurgeon Gancarz, LCSW

## 2023-12-31 NOTE — Patient Instructions (Signed)
 Using Behavioral Activation to manage stress/depression symptoms    Identify/understand your own mood triggers.   Structure your day--get up around the same time, eat meals/snacks around the same time, go to bed around the same time.   Purposefully schedule self care time and time to complete tasks. This can include quiet time  Stimulate your brain--go for a walk, text/call a friend or family member, if you are indoors--go outside (and vice versa), go for a drive, go to a store with bright colors and bright lights. Try to do things in a different way--drive to your favorite places using an alternative route, or instead of starting on the right side of the grocery store when shopping, start on the left side. You might feel a bit uncomfortable doing things outside of the comfort zone, but this is helping the brain create new neural pathways and is very healthy for brain/emotional health.   Physical movement based on your ability. If you can go for a walk, do stretches, even waving your hands to music can trigger feel-good endorphins in the brain and help release physical tension we all hold in our bodies.  Even 5 minutes can make a difference.   Be intentional about doing things that bring you joy (or used to bring you joy), and look for the things in every day that make you happy.  Seek those glimmers of joy each day.  Set a timer for 5 minutes for a harder task (ex. Laundry, washing dishes).  Allow yourself to work distraction-free for 5 minutes, then stop when the timer goes off. If you need a break, take a break. If you want to continue working then set another timer for whatever time you choose.   Limit or eliminate substance use including alcohol, marijuana, or recreational use of prescription medication.  Let in the light!! Open the window blinds, curtains and let natural light in. Even sitting near a window or sitting outside can boost your mood, especially in the wintertime when there  is less daylight.    Things to envision for ourselves to to improve inspiration, motivation, and initiative :  improving physical wellness, focus on family relationships, focusing on our own mental/emotional well being, being a part of a bigger community, finding a hobby, being a part of something that fosters personal growth, engaging socially with others (even digitally!!!)    ANXIETY/PANIC EPISODE MANAGEMENT (CBT/MINDFULNESS BASED)   If you are in a highly stimulating or triggering environment, change your location to a less stimulating or safer environment .   Stimulate your senses by tasting/eating a sour candy (such as a lemon drop) or a strong flavored cough drop.  This triggers smell, taste, and touch since we  have had lots of nerve endings inside of your mouth.   Practice slow, controlled breathing to avoid hyperventilation.  Breathing into your nose for the count of 4 inside your head, holding your breath for the count of 4 inside your head, and exhale slowly counting to 8 inside your head.  This is called 4-4-8 breathing, or triangle breathing. (Controlled breathing). This helps manage panic, anxiety, anger, and tearfulness.  Additional grounding exercises include rubbing your hands softly together, or wiggling toes inside your shoes, pretending to grasp the floor with your toes.    Listen to calming music or sounds  Count backwards in your head by twos or tens or recite multiplication tables in your head.  Visualize a calming, happy place and identify 5 explicit details about this  place (color, temperature, smells, visual details, etc.)  Splash cold water on your face or hold an ice cube in your hand (alternate hands)  Clench and release muscle groups (hands, shoulders, facial muscles)  Engage in soothing activities to recover after a stressful/anxious episode such as: drinking a warm beverage, sitting in your favorite comfortable location, positive physical contact with  a pet or weighted blanket, using positive self talk/positive affirmations.   Download PTSD Coach app for your phone or tablet--its free and has lots of tips to help you manage panic episodes no matter where you are!      Emergency Resources:  National Suicide & Crisis Lifeline: Call or text 988  Crisis Text Line: Text HOME to 810-732-7215  [facility]  [address], [place], [place] [address] [phone] or [phone] [facility] 24/7 FOR ANYONE [address], [place], [place]  [phone] Fax: [phone] [email or web address] *Interpreters available *Accepts all insurance and uninsured for Urgent Care needs *Accepts Medicaid and uninsured for outpatient treatment (below)

## 2024-01-21 ENCOUNTER — Encounter: Admitting: Dietician

## 2024-01-22 ENCOUNTER — Ambulatory Visit
Admission: RE | Admit: 2024-01-22 | Discharge: 2024-01-22 | Disposition: A | Discharge status: Home / Self Care | Source: Ambulatory Visit | Attending: Surgery | Admitting: Surgery

## 2024-01-22 ENCOUNTER — Ambulatory Visit
Admission: RE | Admit: 2024-01-22 | Discharge: 2024-01-22 | Disposition: A | Discharge status: Home / Self Care | Attending: Surgery | Admitting: Surgery

## 2024-01-26 ENCOUNTER — Encounter: Attending: Surgery | Admitting: Dietician

## 2024-01-26 VITALS — Wt 222.0 lb

## 2024-01-26 DIAGNOSIS — E282 Polycystic ovarian syndrome: Secondary | ICD-10-CM | POA: Insufficient documentation

## 2024-01-26 DIAGNOSIS — R7303 Prediabetes: Secondary | ICD-10-CM | POA: Insufficient documentation

## 2024-01-26 DIAGNOSIS — E66813 Obesity, class 3: Secondary | ICD-10-CM

## 2024-01-26 DIAGNOSIS — Z6841 Body Mass Index (BMI) 40.0 and over, adult: Secondary | ICD-10-CM | POA: Diagnosis present

## 2024-01-26 NOTE — Progress Notes (Signed)
 Supervised Weight Loss for Bariatric Surgery Appt start time: 1530 end time:  1600  [facility] Appointment 1 of 3   Anthropometrics Start Weight at [facility]: 222.6 lbs  (12/29/23) Weight: 222lbs (patient reported)  Height: 4'11   BMI: 44.8  Clinical Medications: no changes Health/ medical history changes: no changes  Dietary/ Lifestyle Progress: Family has been making diet changes; daughter has been diagnosed with severe dairy allergy (milk proteins) so they have eliminated multiple food products.  Patient is sampling and investigating suitable options for liquid protein sources for pre-op and post-op nutrition. She is receiving positive support from immediate and extended family.    Dietary intake: Eating Pattern: 3 meals and 1 snacks daily Dining out:   Breakfast: egg sandwich; egg bowl with tomato/ ketchup  Snack: none  Lunch: leftovers; pb or ham sandwich with apples Snack: occ granola bar/ apples/ packaged crackers/ saltines with melted cheese Dinner: ham with rice and squash/ pork roast/ chicken/ venison in place of ground beef + veg -- fewer sandwich meals/ tortillas other starches Snack: none Beverages: water, usually before meals and more inbetween to control appetite/ hunger + coffee some mornings  Usual physical activity: active play time with children    Intervention:   Nutrition counseling for weight loss prior to upcoming bariatric surgery.               Nutritional Diagnosis:  [place]-3.3 Overweight/obesity related to history of excess calories and inadequate physical activity as evidenced by patient with current BMI of 44.8, following dietary guidelines for weight loss prior to bariatric surgery.               Teaching Method Utilized:  Visual Auditory Hands on  Materials provided: Visit summary with goals to be viewed via patient portal  Learning Readiness:  Change in progress  Barriers to learning/adherence to lifestyle change: none  Demonstrated degree of  understanding via:  Teach Back   Plan:  03/01/24

## 2024-01-26 NOTE — Patient Instructions (Signed)
 Continue to eat meals and snacks regularly, emphasizing lean proteins, low-carb veggies, and only small portions of starches.  Keep up regular physical activity.  If blood sugar is low, drink 4oz juice or eat 1/2 cup of applesauce. Allow 15 minutes for symptoms to resolve, then eat a meal or snack with protein and whole grain, healthy carb.

## 2024-01-29 ENCOUNTER — Ambulatory Visit
Admission: RE | Admit: 2024-01-29 | Discharge: 2024-01-29 | Disposition: A | Discharge status: Home / Self Care | Source: Ambulatory Visit | Attending: Surgery | Admitting: Surgery

## 2024-01-29 DIAGNOSIS — Z6841 Body Mass Index (BMI) 40.0 and over, adult: Secondary | ICD-10-CM | POA: Insufficient documentation

## 2024-02-03 ENCOUNTER — Encounter: Payer: Self-pay | Admitting: Family Medicine

## 2024-02-03 ENCOUNTER — Ambulatory Visit: Admitting: Family Medicine

## 2024-02-03 VITALS — BP 116/72 | HR 89 | Ht 59.0 in | Wt 222.0 lb

## 2024-02-03 DIAGNOSIS — L03115 Cellulitis of right lower limb: Secondary | ICD-10-CM

## 2024-02-03 MED ORDER — CEPHALEXIN 500 MG PO CAPS: 500.0000 mg | ORAL_CAPSULE | Freq: Four times a day (QID) | ORAL | 0 refills | Status: AC

## 2024-02-03 MED ORDER — MUPIROCIN 2 % EX OINT: TOPICAL_OINTMENT | CUTANEOUS | 0 refills | Status: AC

## 2024-02-04 DIAGNOSIS — L03115 Cellulitis of right lower limb: Secondary | ICD-10-CM | POA: Insufficient documentation

## 2024-02-04 NOTE — Patient Instructions (Signed)
 VISIT SUMMARY:  You visited us  today due to a painful, red lesion behind your knee that has been present for a few days. The lesion has caused you discomfort, especially when bending your knee, and has been itchy since you first noticed it. There are no signs of fever or chills, and you suspect it may have been caused by scratching after spending time in the woods.  YOUR PLAN:  CELLULITIS OF THE LOWER LIMB: You have an acute skin infection behind your knee, likely caused by minor skin trauma. There are no signs of a more serious infection. -Take the prescribed oral antibiotics as directed to treat the infection. -Apply the prescribed topical antibiotic to the affected area. -Keep the area clean and dry. -Avoid scratching the affected area to prevent further irritation. -You do not need to use an occlusive dressing unless drainage develops. If needed, use gauze and wrapping. -If the itching is severe, you can take oral antihistamines. Do not use topical diphenhydramine . -Watch for signs of a yeast infection, such as itching or discharge, and let us  know if you experience these symptoms. -Stay well-hydrated. -Your prescriptions have been sent to your preferred pharmacy.

## 2024-02-04 NOTE — Progress Notes (Signed)
 "    Primary Care / Sports Medicine Office Visit  Patient Information:  Patient ID: Denise Walker, female DOB: 08-Jun-1990 Age: 33 y.o. MRN: [id number]   Denise Walker is a pleasant 33 y.o. female presenting with the following:  Chief Complaint  Patient presents with   Rash    Rash on Back of right knee since 01/31/24. Rash is red and raised/swollen. Rash is spreading up and down leg. Rash is itching and hurts and has heat to it. No OTC treatments.    Vitals:   02/03/24 1441  BP: 116/72  Pulse: 89  SpO2: 98%   Vitals:   02/03/24 1441  Weight: 222 lb (100.7 kg)  Height: 4' 11 (1.499 m)   Body mass index is 44.84 kg/m.  DG Chest 2 View Result Date: 01/26/2024 CLINICAL DATA:  Morbid obesity. EXAM: CHEST - 2 VIEW COMPARISON:  01/24/2021 FINDINGS: Normal heart size. The cardiomediastinal contours are normal. The lungs are clear. Pulmonary vasculature is normal. No consolidation, pleural effusion, or pneumothorax. No acute osseous abnormalities are seen. IMPRESSION: No active cardiopulmonary disease. Electronically Signed   By: Andrea Gasman M.D.   On: 01/26/2024 11:25     Discussed the use of AI scribe software for clinical note transcription with the patient, who gave verbal consent to proceed.   Independent interpretation of notes and tests performed by another provider:   None  Procedures performed:   None  Pertinent History, Exam, Impression, and Recommendations:   History of Present Illness Denise Walker is a 33 year old female who presents with several days of a painful, erythematous lesion behind the knee.  Cutaneous lesion in popliteal fossa - Onset a few days prior to presentation with unclear exact timing - Initially asymptomatic, subsequently developed pain, tightness, and warmth as of the morning of the visit - Lesion is erythematous, crusted, and located behind the knee - No active drainage observed - Increased discomfort with knee flexion -  Persistent pruritus since first noticing the lesion - No prior similar lesions  Systemic symptoms - No fevers or chills  Potential exposures and contributing factors - Recent time spent in the woods with possible exposure to poison oak, though pants were worn and no immediate reaction occurred - No recollection of specific bite or trauma - Suspects scratching may have exacerbated the condition  Physical Exam SKIN: Skin infection present behind the knee without fluid leakage.    Media Information   Document Information  Photographic Image: Photos  RIGHT popliteal  02/03/2024 15:29  Attached To:  Office Visit on 02/03/24 with Alvia Selinda PARAS, MD  Source Information  Alvia Selinda PARAS, MD  [facility]   Results Clinical photography of skin lesion Digital images obtained of posterior knee skin lesion for documentation and monitoring.  Assessment and Plan Cellulitis of the right lower limb Acute cellulitis of the posterior right knee, localized without systemic symptoms. Likely due to minor skin trauma with normal skin flora inoculation. No abscess or severe systemic involvement. - Prescribed oral antibiotics for systemic coverage. - Prescribed topical antibiotic for local treatment. - Advised to maintain local hygiene by keeping the area clean and dry. - Instructed to avoid excoriation of the affected area. - Advised occlusive dressing unnecessary unless drainage develops; use gauze and wrapping if needed. - Recommended oral antihistamines for significant pruritus; advised against topical diphenhydramine . - Instructed to monitor for signs of vulvovaginal candidiasis as a potential adverse effect of antibiotics and to notify if symptoms  occur. - Advised to maintain adequate hydration. - Sent prescriptions to her preferred pharmacy.  Problem List Items Addressed This Visit     Cellulitis of right lower extremity - Primary     Orders &  Medications Medications:  Meds ordered this encounter  Medications   cephALEXin  (KEFLEX ) 500 MG capsule    Sig: Take 1 capsule (500 mg total) by mouth 4 (four) times daily for 5 days.    Dispense:  20 capsule    Refill:  0   mupirocin  ointment (BACTROBAN ) 2 %    Sig: Apply to affected area TID for 5 days.    Dispense:  30 g    Refill:  0   No orders of the defined types were placed in this encounter.    No follow-ups on file.     Selinda JINNY Ku, MD, [facility]   Primary Care Sports Medicine Primary Care and Sports Medicine at [facility]   "

## 2024-02-06 ENCOUNTER — Ambulatory Visit: Payer: Self-pay

## 2024-02-06 ENCOUNTER — Encounter (HOSPITAL_COMMUNITY): Payer: Self-pay | Admitting: Surgery

## 2024-02-06 ENCOUNTER — Encounter: Payer: Self-pay | Admitting: Student

## 2024-02-06 ENCOUNTER — Ambulatory Visit: Admitting: Student

## 2024-02-06 VITALS — BP 110/70 | HR 100 | Ht 59.0 in | Wt 222.0 lb

## 2024-02-06 DIAGNOSIS — L03115 Cellulitis of right lower limb: Secondary | ICD-10-CM | POA: Diagnosis not present

## 2024-02-06 MED ORDER — SULFAMETHOXAZOLE-TRIMETHOPRIM 800-160 MG PO TABS: 1.0000 | ORAL_TABLET | Freq: Two times a day (BID) | ORAL | 0 refills | Status: DC

## 2024-02-06 NOTE — Telephone Encounter (Signed)
 " FYI Only or Action Required?: FYI only for provider: appointment scheduled on 02/06/24.  Patient was last seen in primary care on 02/03/2024 by Denise Selinda PARAS, MD.  Called Nurse Triage reporting Wound Infection.  Symptoms began a week ago.  Interventions attempted: Prescription medications: Keflex , mupirocin .  Symptoms are: gradually worsening.  Triage Disposition: See HCP Within 4 Hours (Or PCP Triage) (overriding Call PCP Now)  Patient/caregiver understands and will follow disposition?: Yes  Copied from CRM [id number]. Topic: Clinical - Red Word Triage >> Feb 06, 2024  8:00 AM Denise Walker wrote: Red Word that prompted transfer to Nurse Triage: Has a leg infection (r) with puss and can not walk Reason for Disposition  [1] Fever > 100 F (37.8 C) AND [2] new-onset  Answer Assessment - Initial Assessment Questions 1. SYMPTOM: What's the main symptom you're concerned about? (e.g., redness, swelling, pain, fever, weakness)     *No Answer* 2. WOUND INFECTION LOCATION: Where is the wound infection located? (e.g., arm, face, foot, knee, leg)     *No Answer* 3. WOUND INFECTION SIZE: What is the size of the red area? (e.g., inches, centimeters; compare to size of a coin)      *No Answer* 4. BETTER-SAME-WORSE: Are you getting better, staying the same, or getting worse compared to the day you started the antibiotics?      *No Answer* 5. PAIN: Do you have any pain?  If Yes, ask: How bad is the pain?  (e.g., Scale 1-10; mild, moderate, or severe)     *No Answer* 6. FEVER: Do you have a fever? If Yes, ask: What is it, how was it measured and when did it start?     *No Answer* 7. OTHER SYMPTOMS: Do you have any other symptoms? (e.g., pus coming from a wound, red streaks, weakness)     Pus draining 8. DIAGNOSIS DATE: When was the wound infection diagnosed? By whom?      *No Answer* 9. ANTIBIOTIC NAME: What antibiotic(s) are you taking?  How many times a day? Note: Be  sure the patient is taking the antibiotic as directed.      *No Answer* 10. ANTIBIOTIC DATE: When was the antibiotic started?       23rd 36. FOLLOW-UP APPOINTMENT: Do you have a follow-up appointment with your doctor?       *No Answer*  Answer Assessment - Initial Assessment Questions 1. SYMPTOM: What's the main symptom you're concerned about? (e.g., redness, swelling, pain, fever, weakness)     Increased pain, redness, drainging pus 2. CELLULITIS LOCATION: Where is the cellulitis located? (e.g., hand, arm, foot, leg, face)     Back of rt knee  4. BETTER-SAME-WORSE: Are you getting better, staying the same, or getting worse compared to the day you started the antibiotics?      Getting worse 5. PAIN: Do you have any pain?  If Yes, ask: How bad is the pain?  (e.g., Scale 0-10; mild, moderate, or severe)     5.10 6. FEVER: Do you have a fever? If Yes, ask: What is it, how was it measured and when did it start?     100.3 this am 7. OTHER SYMPTOMS: Do you have any other symptoms? (e.g., pus coming from a wound, red streaks, weakness)     Denies red streaks 8. DIAGNOSIS DATE: When was the cellulitis diagnosed? By whom?      12/23 Dr. Alvia 9. ANTIBIOTIC NAME: What antibiotic(s) are you taking?  How many times per day? (  Be sure the patient is taking the antibiotic as directed).      Keflex  10. ANTIBIOTIC DATE: When was the antibiotic started?       12/23  Protocols used: Wound Infection on Antibiotic Follow-up Call-A-AH, Cellulitis on Antibiotic Follow-up Call-A-AH  "

## 2024-02-06 NOTE — Progress Notes (Signed)
 "  Established Patient Office Visit  Subjective   Patient ID: Denise Walker, female    DOB: [birth year]  Age: 34 y.o. MRN: [id number]  Chief Complaint  Patient presents with   Rash    Rash has got worse since visit on 02/03/24. Redness has spread and rash has drainage. Patient taking cephalexin  4 x daily with topical ointment (mupirocin ) 3 x daily.     Denise Walker is a 33 y.o. person with medical hx listed below who presents today for rash that first started about 1 week ago. Saw PCP on 12/23 and prescribed keflex  which she has been taking for the past 3 days. Feels the redness is increasing with increased in yellow clear drainage. Having more pain in the back of the knee with bending. Has been scratching in her sleep. Was in the woods 1 week ago, notes that clothing caught on briars frequently, does not recall specific contact with poison ivy/poison oak at that time. Denies fevers, chills, bleeding, knee swelling, n/v/d.   Patient Active Problem List   Diagnosis Date Noted   Cellulitis of right lower extremity 02/04/2024   COVID-19 10/28/2023   Healthcare maintenance 10/06/2023   Subclinical hypothyroidism 10/06/2023   Flexural eczema 09/06/2023   Arthralgia of left knee 06/04/2023   Chronic constipation 01/02/2023   Allergic rhinitis 01/02/2023   Excessive daytime sleepiness 11/18/2022   Vitamin D  deficiency 11/18/2022   Gastroesophageal reflux disease without esophagitis 06/02/2022   Recurrent sinusitis 04/19/2022   Obesity due to excess calories with serious comorbidity 03/21/2022   Mixed hyperlipidemia 06/04/2021   Prediabetes 06/04/2021   PCOS (polycystic ovarian syndrome) 05/01/2021   Chronic pain of right knee 05/01/2021   Chronic foot pain, left 05/01/2021   Chronic post-traumatic stress disorder (PTSD) 12/21/2020   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   clobetasol ointment (TEMOVATE) 0.05 % Apply topically as needed.   levonorgestrel  (MIRENA ) 20 MCG/24HR IUD by Intrauterine route.   levothyroxine  (SYNTHROID ) 75 MCG tablet TAKE 1 TABLET BY MOUTH EVERY DAY   mupirocin  ointment (BACTROBAN ) 2 % Apply to affected area TID for 5 days.   pantoprazole  (PROTONIX ) 40 MG tablet TAKE 1 TABLET BY MOUTH EVERY DAY   sulfamethoxazole -trimethoprim  (BACTRIM  DS) 800-160 MG tablet Take 1 tablet by mouth 2 (two) times daily for 5 days.   No facility-administered encounter medications on file as of 02/06/2024.    BP 110/70   Pulse 100   Ht 4' 11 (1.499 m)   Wt 222 lb (100.7 kg)   SpO2 95%   BMI 44.84 kg/m  BP Readings from Last 3 Encounters:  02/06/24 110/70  02/03/24 116/72  12/18/23 117/68    Physical Exam Constitutional:      Appearance: Normal appearance.  HENT:     Mouth/Throat:     Mouth: Mucous membranes are moist.     Pharynx: Oropharynx is clear.  Cardiovascular:     Rate and Rhythm: Normal rate and regular rhythm.  Pulmonary:     Effort: Pulmonary effort is normal.  Breath sounds: No rhonchi or rales.  Abdominal:     General: Abdomen is flat.     Palpations: Abdomen is soft.  Musculoskeletal:        General: Normal range of motion.     Right lower leg: No edema.     Left lower leg: No edema.  Skin:    Capillary Refill: Capillary refill takes less than 2 seconds.     Comments: Erythema of the R posterior knee, with purulent drainage , no joint pain or swelling   Neurological:     General: No focal deficit present.     Mental Status: She is alert and oriented to person, place, and time.   Psychiatric:        Mood and Affect: Mood normal.        Behavior: Behavior normal.         02/06/2024    9:48 AM 02/03/2024    2:46 PM 12/29/2023    1:14 PM  Depression screen PHQ 2/9  Decreased Interest 0 0 0  Down, Depressed, Hopeless 0 0 0  PHQ - 2 Score 0 0 0  Altered sleeping  0   Tired, decreased energy  1   Change in appetite  0   Feeling bad or failure about yourself   0   Trouble concentrating  0   Moving slowly or fidgety/restless  0   Suicidal thoughts  0   PHQ-9 Score  1   Difficult doing work/chores  Not difficult at all        02/03/2024    2:49 PM 10/21/2023    1:47 PM 10/06/2023    9:13 AM 08/01/2023   11:28 AM  GAD 7 : Generalized Anxiety Score  Nervous, Anxious, on Edge 0 0 1 1  Control/stop worrying 0 0 0 0  Worry too much - different things 1 0 0 1  Trouble relaxing 0 0 0 0  Restless 0 0 1 0  Easily annoyed or irritable 0 0 1 1  Afraid - awful might happen 0 0 0 0  Total GAD 7 Score 1 0 3 3  Anxiety Difficulty Not difficult at all Not difficult at all Not difficult at all Somewhat difficult    No results found for any visits on 02/06/24.    The ASCVD Risk score (Arnett DK, et al., 2019) failed to calculate for the following reasons:   The 2019 ASCVD risk score is only valid for ages 89 to 38    Assessment & Plan:  Cellulitis of right lower extremity Worsening rash now with purulent drainage since starting keflex  3 days ago. Still having some itching.  Suspect she may have developed contact dermatitis and now developing a secondary bacterial infection due to excoriation.  Given lack of improvement with Keflex  will broaden to Bactrim  for 5 days.  Follow-up with PCP if symptoms or not improving.  ED precautions given for signs of systemic/deep infection. Other orders -     Sulfamethoxazole -Trimethoprim ; Take 1 tablet by mouth 2 (two) times daily for 5 days.  Dispense: 10 tablet; Refill: 0     Return if symptoms worsen or fail to improve.     Harlene Saddler, MD "

## 2024-02-09 ENCOUNTER — Ambulatory Visit: Payer: Self-pay | Admitting: Surgery

## 2024-02-09 ENCOUNTER — Telehealth: Payer: Self-pay | Admitting: Family Medicine

## 2024-02-09 DIAGNOSIS — Z713 Dietary counseling and surveillance: Secondary | ICD-10-CM

## 2024-02-09 DIAGNOSIS — Z01818 Encounter for other preprocedural examination: Secondary | ICD-10-CM

## 2024-02-09 NOTE — Telephone Encounter (Signed)
 Please review.  KP

## 2024-02-09 NOTE — Telephone Encounter (Signed)
 Please let patient know the following:  Based on your symptoms, please stop taking Bactrim  (sulfamethoxazole -trimethoprim ) now. The hives youre describing may represent an allergic reaction to this medication.  You may start an oral antihistamine such as cetirizine (Zyrtec) or loratadine (Claritin) once daily. If itching is worse at night, you may use diphenhydramine  (Benadryl ) at bedtime if tolerated.  Please go to the emergency department immediately if you develop: - Swelling of the face, lips, or tongue - Shortness of breath or trouble breathing - Worsening or spreading hives  We will reassess your leg in person at your appointment tomorrow at 9:00 AM (please make sure she has a visit scheduled) to better determine whether this represents ongoing infection versus skin inflammation or contact dermatitis, and to decide on the most appropriate next treatment. No additional antibiotics are recommended until you are examined.  In the meantime: - Avoid scratching if possible - Keep the area clean and dry - You may apply a bland barrier ointment (such as petroleum jelly)  Selinda JINNY Ku, MD, [facility]   Primary Care Sports Medicine Primary Care and Sports Medicine at [facility]

## 2024-02-09 NOTE — Telephone Encounter (Signed)
 Spoke with patient and informed her of above message and made her appt.   JM

## 2024-02-09 NOTE — Telephone Encounter (Signed)
 Copied from CRM #[id number]. Topic: Clinical - Medical Advice >> Feb 09, 2024 11:36 AM Amber H wrote: Reason for CRM: Patient stated she started bactrum for her leg infection however, the redness started spreading again, hurts a little on right leg, random hives on both legs. She wanted to be seen today however, no acute visits available. Earliest appt time was tomorrow at 9am. I offered that she spoke to a nurse and she stated no. Wanted provider to reach out to her about what she could do.    [phone]

## 2024-02-10 ENCOUNTER — Ambulatory Visit: Admitting: Family Medicine

## 2024-02-10 ENCOUNTER — Encounter: Payer: Self-pay | Admitting: Family Medicine

## 2024-02-10 VITALS — BP 98/72 | HR 85 | Ht 59.0 in | Wt 222.0 lb

## 2024-02-10 DIAGNOSIS — B3731 Acute candidiasis of vulva and vagina: Secondary | ICD-10-CM | POA: Insufficient documentation

## 2024-02-10 DIAGNOSIS — L03115 Cellulitis of right lower limb: Secondary | ICD-10-CM | POA: Diagnosis not present

## 2024-02-10 MED ORDER — FLUCONAZOLE 150 MG PO TABS: 150.0000 mg | ORAL_TABLET | Freq: Once | ORAL | 0 refills | Status: AC

## 2024-02-10 MED ORDER — HYDROXYZINE PAMOATE 25 MG PO CAPS: 25.0000 mg | ORAL_CAPSULE | Freq: Four times a day (QID) | ORAL | 0 refills | Status: AC | PRN

## 2024-02-10 MED ORDER — DOXYCYCLINE HYCLATE 100 MG PO TABS: 100.0000 mg | ORAL_TABLET | Freq: Two times a day (BID) | ORAL | 0 refills | Status: AC

## 2024-02-10 MED ORDER — AMOXICILLIN-POT CLAVULANATE 875-125 MG PO TABS: 1.0000 | ORAL_TABLET | Freq: Two times a day (BID) | ORAL | 0 refills | Status: AC

## 2024-02-10 NOTE — Assessment & Plan Note (Signed)
 History of Present Illness Denise Walker is a 33 year old female with obesity who presents for evaluation of persistent and spreading cellulitis with pruritus following recent antibiotic therapy.  Cutaneous erythema and papular eruptions - Persistent erythema with intermittent extension of affected areas - Ongoing appearance of new papular lesions - Lesions are drying but remain visually concerning - No resolution of papular eruptions  Pruritus and urticarial reaction - Initial pruritus was generalized and severe - Following recent medication changes, pruritus is now more localized - Persistent localized pruritus despite medication adjustments - Increased pruritus and possible urticarial reaction occurred with trimethoprim -sulfamethoxazole , prompting discontinuation - After cessation of trimethoprim -sulfamethoxazole , urticaria improved and generalized pruritus decreased, but localized pruritus persists  Antibiotic therapy and response - Continuous antibiotic therapy since symptom onset - Initial therapy with cephalexin  provided partial improvement - Switched to trimethoprim -sulfamethoxazole  due to persistent symptoms - Trimethoprim -sulfamethoxazole  caused increased pruritus and possible urticaria, leading to discontinuation - Last dose of trimethoprim -sulfamethoxazole  taken yesterday; today is the first day without antibiotics since symptom onset  Antipruritic medication use and side effects - Diphenhydramine  used for the past week for pruritus, providing temporary relief for 3-4 hours - Diphenhydramine  causes significant sedation - Hydroxyzine  available at home, but expiration date is uncertain  Vulvovaginal candidiasis symptoms - Onset of vulvovaginal candidiasis symptoms began last night - Symptoms attributed to recent broad-spectrum antibiotic use - No diarrhea  Results Cutaneous lesion demarcation with permanent marker Outline of affected area on skin created with permanent  marker for tracking progression of cellulitis.    Media Information  Document Information  Photographic Image: Photos  Right popliteal  02/10/2024 10:18  Attached To:  Office Visit on 02/10/24 with Alvia Selinda PARAS, MD  Source Information  Alvia Selinda PARAS, MD  [facility]    Assessment and Plan Cellulitis with pruritus Persistent skin infection with pruritus, possibly secondary to scratching and will cover for MRSA. Bactrim -induced pruritus and urticaria suggests sulfa  allergy. Will take into account duration of prior antibiotics and clinical progress. - Prescribed amoxicillin  and doxycycline  for MRSA coverage without sulfa  exposure. - Recommended probiotic supplementation during antibiotic therapy. - Instructed to take antibiotics for five days. - Marked cellulitis borders to monitor progression. - Prescribed hydroxyzine  for pruritus, titrate to effect, monitor sedation. - Advised to avoid scratching. - Instructed to report progress and changes before endoscopy.  Candidiasis Likely secondary to antibiotic-induced flora disruption. - Prescribed fluconazole, two doses; take second dose if symptoms persist or recur after three days.

## 2024-02-10 NOTE — Patient Instructions (Signed)
 VISIT SUMMARY:  You visited us  today to address your persistent cellulitis and pruritus, as well as new symptoms of vulvovaginal candidiasis. We have adjusted your medications to better manage these conditions and provided instructions for monitoring and follow-up.  YOUR PLAN:  CELLULITIS WITH PRURITUS: You have a persistent skin infection with itching. -We have prescribed amoxicillin  and doxycycline  to cover MRSA without using sulfa  drugs. -Take probiotics during your antibiotic therapy. -Take the antibiotics. -We have marked the borders of the infection to help monitor its progression. -Use hydroxyzine  for itching, adjusting the dose as needed, and be aware it may cause drowsiness. -Avoid scratching the affected areas. -Please report your progress and any changes before your scheduled endoscopy.  VULVOVAGINAL CANDIDIASIS: You have symptoms likely caused by a disruption in your natural flora due to recent antibiotic use. -We have prescribed fluconazole. Take the second dose if symptoms persist or come back after three days.

## 2024-02-10 NOTE — Progress Notes (Signed)
 "    Primary Care / Sports Medicine Office Visit  Patient Information:  Patient ID: Denise Walker, female DOB: 1991/01/20 Age: 32 y.o. MRN: [id number]   Denise Walker is a pleasant 33 y.o. female presenting with the following:  Chief Complaint  Patient presents with   Rash    Rash redness continues to spread and itch. Patient d/c her bactrim . She continues to use the murpirocin ointment. Rash is not oozing anymore.    Vitals:   02/10/24 0945  BP: 98/72  Pulse: 85  SpO2: 96%   Vitals:   02/10/24 0945  Weight: 222 lb (100.7 kg)  Height: 4' 11 (1.499 m)   Body mass index is 44.84 kg/m.  DG Chest 2 View Result Date: 01/26/2024 CLINICAL DATA:  Morbid obesity. EXAM: CHEST - 2 VIEW COMPARISON:  01/24/2021 FINDINGS: Normal heart size. The cardiomediastinal contours are normal. The lungs are clear. Pulmonary vasculature is normal. No consolidation, pleural effusion, or pneumothorax. No acute osseous abnormalities are seen. IMPRESSION: No active cardiopulmonary disease. Electronically Signed   By: Andrea Gasman M.D.   On: 01/26/2024 11:25     Discussed the use of AI scribe software for clinical note transcription with the patient, who gave verbal consent to proceed.   Independent interpretation of notes and tests performed by another provider:   None  Procedures performed:   None  Pertinent History, Exam, Impression, and Recommendations:   Problem List Items Addressed This Visit     Cellulitis of right lower extremity - Primary   History of Present Illness Denise Walker is a 33 year old female with obesity who presents for evaluation of persistent and spreading cellulitis with pruritus following recent antibiotic therapy.  Cutaneous erythema and papular eruptions - Persistent erythema with intermittent extension of affected areas - Ongoing appearance of new papular lesions - Lesions are drying but remain visually concerning - No resolution of papular  eruptions  Pruritus and urticarial reaction - Initial pruritus was generalized and severe - Following recent medication changes, pruritus is now more localized - Persistent localized pruritus despite medication adjustments - Increased pruritus and possible urticarial reaction occurred with trimethoprim -sulfamethoxazole , prompting discontinuation - After cessation of trimethoprim -sulfamethoxazole , urticaria improved and generalized pruritus decreased, but localized pruritus persists  Antibiotic therapy and response - Continuous antibiotic therapy since symptom onset - Initial therapy with cephalexin  provided partial improvement - Switched to trimethoprim -sulfamethoxazole  due to persistent symptoms - Trimethoprim -sulfamethoxazole  caused increased pruritus and possible urticaria, leading to discontinuation - Last dose of trimethoprim -sulfamethoxazole  taken yesterday; today is the first day without antibiotics since symptom onset  Antipruritic medication use and side effects - Diphenhydramine  used for the past week for pruritus, providing temporary relief for 3-4 hours - Diphenhydramine  causes significant sedation - Hydroxyzine  available at home, but expiration date is uncertain  Vulvovaginal candidiasis symptoms - Onset of vulvovaginal candidiasis symptoms began last night - Symptoms attributed to recent broad-spectrum antibiotic use - No diarrhea  Results Cutaneous lesion demarcation with permanent marker Outline of affected area on skin created with permanent marker for tracking progression of cellulitis.    Media Information  Document Information  Photographic Image: Photos  Right popliteal  02/10/2024 10:18  Attached To:  Office Visit on 02/10/24 with Alvia Denise PARAS, MD  Source Information  Alvia Denise PARAS, MD  [facility]    Assessment and Plan Cellulitis with pruritus Persistent skin infection with pruritus, possibly secondary to scratching and will  cover for MRSA. Bactrim -induced pruritus and urticaria suggests  sulfa  allergy. Will take into account duration of prior antibiotics and clinical progress. - Prescribed amoxicillin  and doxycycline  for MRSA coverage without sulfa  exposure. - Recommended probiotic supplementation during antibiotic therapy. - Instructed to take antibiotics for five days. - Marked cellulitis borders to monitor progression. - Prescribed hydroxyzine  for pruritus, titrate to effect, monitor sedation. - Advised to avoid scratching. - Instructed to report progress and changes before endoscopy.  Candidiasis Likely secondary to antibiotic-induced flora disruption. - Prescribed fluconazole, two doses; take second dose if symptoms persist or recur after three days.      Relevant Medications   hydrOXYzine  (VISTARIL ) 25 MG capsule   amoxicillin -clavulanate (AUGMENTIN ) 875-125 MG tablet   doxycycline  (VIBRA -TABS) 100 MG tablet     Orders & Medications Medications:  Meds ordered this encounter  Medications   hydrOXYzine  (VISTARIL ) 25 MG capsule    Sig: Take 1-2 capsules (25-50 mg total) by mouth every 6 (six) hours as needed for itching.    Dispense:  30 capsule    Refill:  0   amoxicillin -clavulanate (AUGMENTIN ) 875-125 MG tablet    Sig: Take 1 tablet by mouth 2 (two) times daily for 5 days.    Dispense:  10 tablet    Refill:  0   doxycycline  (VIBRA -TABS) 100 MG tablet    Sig: Take 1 tablet (100 mg total) by mouth 2 (two) times daily for 5 days.    Dispense:  10 tablet    Refill:  0   fluconazole (DIFLUCAN) 150 MG tablet    Sig: Take 1 tablet (150 mg total) by mouth once for 1 dose. May repeat after 3 days if needed.    Dispense:  2 tablet    Refill:  0   No orders of the defined types were placed in this encounter.    No follow-ups on file.     Denise JINNY Ku, MD, [facility]   Primary Care Sports Medicine Primary Care and Sports Medicine at [facility]   "

## 2024-02-19 ENCOUNTER — Ambulatory Visit (HOSPITAL_COMMUNITY)

## 2024-02-19 ENCOUNTER — Encounter (HOSPITAL_COMMUNITY): Payer: Self-pay | Admitting: Surgery

## 2024-02-19 ENCOUNTER — Other Ambulatory Visit: Payer: Self-pay

## 2024-02-19 ENCOUNTER — Encounter (HOSPITAL_COMMUNITY)
Admission: RE | Disposition: A | Discharge status: Home / Self Care | Payer: Self-pay | Source: Home / Self Care | Attending: Surgery

## 2024-02-19 ENCOUNTER — Ambulatory Visit (HOSPITAL_COMMUNITY)
Admission: RE | Admit: 2024-02-19 | Discharge: 2024-02-19 | Disposition: A | Discharge status: Home / Self Care | Attending: Surgery | Admitting: Surgery

## 2024-02-19 DIAGNOSIS — E119 Type 2 diabetes mellitus without complications: Secondary | ICD-10-CM | POA: Diagnosis not present

## 2024-02-19 DIAGNOSIS — T182XXA Foreign body in stomach, initial encounter: Secondary | ICD-10-CM | POA: Diagnosis not present

## 2024-02-19 DIAGNOSIS — E039 Hypothyroidism, unspecified: Secondary | ICD-10-CM | POA: Diagnosis not present

## 2024-02-19 DIAGNOSIS — Z01818 Encounter for other preprocedural examination: Secondary | ICD-10-CM | POA: Insufficient documentation

## 2024-02-19 DIAGNOSIS — Z6841 Body Mass Index (BMI) 40.0 and over, adult: Secondary | ICD-10-CM | POA: Diagnosis not present

## 2024-02-19 DIAGNOSIS — F418 Other specified anxiety disorders: Secondary | ICD-10-CM | POA: Insufficient documentation

## 2024-02-19 DIAGNOSIS — K219 Gastro-esophageal reflux disease without esophagitis: Secondary | ICD-10-CM | POA: Diagnosis not present

## 2024-02-19 DIAGNOSIS — E282 Polycystic ovarian syndrome: Secondary | ICD-10-CM | POA: Insufficient documentation

## 2024-02-19 HISTORY — PX: ESOPHAGOGASTRODUODENOSCOPY: SHX5428

## 2024-02-19 HISTORY — PX: BIOPSY OF SKIN SUBCUTANEOUS TISSUE AND/OR MUCOUS MEMBRANE: SHX6741

## 2024-02-19 SURGERY — EGD (ESOPHAGOGASTRODUODENOSCOPY)
Anesthesia: Monitor Anesthesia Care

## 2024-02-19 MED ORDER — FENTANYL CITRATE (PF) 100 MCG/2ML IJ SOLN
INTRAMUSCULAR | Status: AC
  Filled 2024-02-19: qty 2

## 2024-02-19 MED ORDER — PROPOFOL 1000 MG/100ML IV EMUL
INTRAVENOUS | Status: AC
  Filled 2024-02-19: qty 100

## 2024-02-19 MED ORDER — LIDOCAINE 2% (20 MG/ML) 5 ML SYRINGE
INTRAMUSCULAR | Status: DC | PRN
  Administered 2024-02-19: 100 mg via INTRAVENOUS

## 2024-02-19 MED ORDER — SODIUM CHLORIDE 0.9 % IV SOLN
INTRAVENOUS | Status: AC | PRN
  Administered 2024-02-19: 500 mL via INTRAVENOUS

## 2024-02-19 MED ORDER — PROPOFOL 10 MG/ML IV BOLUS
INTRAVENOUS | Status: DC | PRN
  Administered 2024-02-19: 50 mg via INTRAVENOUS
  Administered 2024-02-19: 100 ug/kg/min via INTRAVENOUS

## 2024-02-19 MED ORDER — SODIUM CHLORIDE 0.9 % IV SOLN: INTRAVENOUS | Status: DC | PRN

## 2024-02-19 MED ORDER — FENTANYL CITRATE (PF) 100 MCG/2ML IJ SOLN
INTRAMUSCULAR | Status: DC | PRN
  Administered 2024-02-19: 50 ug via INTRAVENOUS

## 2024-02-19 NOTE — Progress Notes (Signed)
 Per patient, unable to urinate this morning. Patient says she has had issues with infertility and has an IUD. She understands the risks of not taking a pregnancy test today. Dr. Celine aware.

## 2024-02-19 NOTE — Anesthesia Procedure Notes (Signed)
 Procedure Name: MAC Date/Time: 02/19/2024 7:30 AM  Performed by: Obadiah Reyes BROCKS, CRNAPre-anesthesia Checklist: Patient identified, Emergency Drugs available, Suction available, Patient being monitored and Timeout performed Patient Re-evaluated:Patient Re-evaluated prior to induction Oxygen Delivery Method: Simple face mask Preoxygenation: Pre-oxygenation with 100% oxygen Induction Type: IV induction

## 2024-02-19 NOTE — Op Note (Signed)
 [facility] Patient Name: Denise Walker Procedure Date: 02/19/2024 MRN: [id number] Attending MD: Deward JINNY Foy , ,  Date of Birth: 25-Jul-1990 CSN: [id number] Age: 34 Admit Type: Outpatient Procedure:                Upper GI endoscopy Indications:               Providers:                Deward DOROTHA Foy, Almarie Masters, RN, Farris Southgate, Technician Referring MD:              Medicines:                Monitored Anesthesia Care Complications:            No immediate complications. Estimated Blood Loss:     Estimated blood loss was minimal. Procedure:                Pre-Anesthesia Assessment:                           - Prior to the procedure, a History and Physical                            was performed, and patient medications and                            allergies were reviewed. The patient is competent.                            The risks and benefits of the procedure and the                            sedation options and risks were discussed with the                            patient. All questions were answered and informed                            consent was obtained. Patient identification and                            proposed procedure were verified. Mental Status                            Examination: alert and oriented. Airway                            Examination: normal oropharyngeal airway and neck                            mobility. Respiratory Examination: clear to                            auscultation. CV Examination: normal. ASA Grade  Assessment: II - A patient with mild systemic                            disease. After reviewing the risks and benefits,                            the patient was deemed in satisfactory condition to                            undergo the procedure. The anesthesia plan was to                            use monitored anesthesia care (MAC).  Immediately                            prior to administration of medications, the patient                            was re-assessed for adequacy to receive sedatives.                            The heart rate, respiratory rate, oxygen                            saturations, blood pressure, adequacy of pulmonary                            ventilation, and response to care were monitored                            throughout the procedure. The physical status of                            the patient was re-assessed after the procedure.                           After obtaining informed consent, the endoscope was                            passed under direct vision. Throughout the                            procedure, the patient's blood pressure, pulse, and                            oxygen saturations were monitored continuously. The                            [id number]-H190 ([id number] Olympus endoscope was introduced                            through the mouth, and advanced to the second part  of duodenum. The upper GI endoscopy was                            accomplished without difficulty. The patient                            tolerated the procedure well. Scope In: Scope Out: Findings:      The examined esophagus was normal.      The gastroesophageal flap valve was visualized endoscopically and       classified as Hill Grade I (prominent fold, tight to endoscope).      The entire examined stomach was normal. Biopsies were taken with a cold       forceps for Helicobacter pylori testing. Verification of patient       identification for the specimen was done. Estimated blood loss was       minimal.      The in the duodenum was normal. Impression:               - Normal esophagus.                           - Gastroesophageal flap valve classified as Hill                            Grade I (prominent fold, tight to endoscope).                           - Normal  stomach. Biopsied.                           - Normal. Moderate Sedation:      Moderate (conscious) sedation was personally administered by an       anesthesia professional. The following parameters were monitored: oxygen       saturation, heart rate, blood pressure, and response to care. Total       physician intraservice time was 15 minutes. Recommendation:           - Discharge patient to home.                           - Resume previous diet. Procedure Code(s):        --- Professional ---                           [phone], Esophagogastroduodenoscopy, flexible,                            transoral; with biopsy, single or multiple Diagnosis Code(s):        --- Professional ---                           S98.181, Encounter for other preprocedural                            examination CPT copyright 2022 American Medical Association. All rights reserved. The codes documented in this report are preliminary and upon coder review may  be revised to meet current compliance  requirements. Deward PARAS Arleth Mccullar,  02/19/2024 7:53:35 AM Number of Addenda: 0

## 2024-02-19 NOTE — H&P (Signed)
 "  Admitting Physician: Deward PARAS Krissie Merrick  Service: Bariatric surgery  CC: Obesity  Subjective   HPI: Denise Walker is an 34 y.o. 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  Negative on 7/20 and 9/21.  Rhogam received 9/21     Anxiety    Back pain    low back pain with lumbar radiculitis   Bilateral shoulder pain    BRCA gene mutation negative 05/04/2018   MyRisk negative 04/15/2018 Remaining lifetime risk 6.7%     BRCA negative 04/2018   MyRisk neg; IBIS=8.9%/riskscore=6.7%   Bronchitis 01/21/2021   Eclampsia    Family history of ovarian cancer    Gestational diabetes    History of cesarean delivery 02/10/2018   History of eclampsia 07/22/2017   [X]  Aspirin 81 mg daily after 12 weeks; discontinue after 36 weeks     Baseline and surveillance labs (pulled in from EPIC, refresh links as needed)           Lab Results      Component    Value    Date           PLT    292    07/22/2017           CREATININE    0.73    07/22/2017           AST    16    07/22/2017           ALT    13    07/22/2017           PROTCRRATIO    69    07/16/2014             Migraine headache    Nausea and vomiting 04/15/2023   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   PCOS (polycystic ovarian syndrome)    Rh negative state in antepartum period 08/23/2017    Past Surgical History:  Procedure Laterality Date   CESAREAN SECTION N/A 07/15/2014   Procedure: CESAREAN SECTION;  Surgeon: Lamar SHAUNNA Lesches, MD;  Location: [facility] ORS;  Service: Obstetrics;  Laterality: N/A;   CESAREAN SECTION N/A 02/10/2018   Procedure: CESAREAN SECTION;  Surgeon: Leonce Garnette JONETTA, MD;  Location: [facility] ORS;  Service: Obstetrics;  Laterality: N/A;   CESAREAN SECTION N/A 12/31/2018   Procedure: CESAREAN SECTION;  Surgeon: Lake Read, MD;  Location: [facility] ORS;   Service: Obstetrics;  Laterality: N/A;   CESAREAN SECTION     HERNIA REPAIR  10/2013   WISDOM TOOTH EXTRACTION      Family History  Problem Relation Age of Onset   Bipolar disorder Mother    Ovarian cancer Mother    Arthritis Mother    Ovarian cancer Maternal Aunt    Diabetes Paternal Aunt    Breast cancer Maternal Grandmother    Diabetes Paternal Grandfather    Uterine cancer Cousin    ADD / ADHD Daughter    ODD Daughter     Social:  reports that she has never smoked. She has never used smokeless tobacco. She reports current alcohol use. She reports that she does not use drugs.  Allergies: Allergies[1]  Medications: Current Outpatient Medications  Medication Instructions   clobetasol ointment (TEMOVATE) 0.05 % As needed   hydrOXYzine  (VISTARIL ) 25-50 mg, Oral, Every 6 hours PRN   levonorgestrel  (MIRENA )  20 MCG/24HR IUD by Intrauterine route.   levothyroxine  (SYNTHROID ) 75 mcg, Oral, Daily   mupirocin  ointment (BACTROBAN ) 2 % Apply to affected area TID for 5 days.   pantoprazole  (PROTONIX ) 40 mg, Oral, Daily    ROS - all of the below systems have been reviewed with the patient and positives are indicated with bold text General: chills, fever or night sweats Eyes: blurry vision or double vision ENT: epistaxis or sore throat Allergy/Immunology: itchy/watery eyes or nasal congestion Hematologic/Lymphatic: bleeding problems, blood clots or swollen lymph nodes Endocrine: temperature intolerance or unexpected weight changes Breast: new or changing breast lumps or nipple discharge Resp: cough, shortness of breath, or wheezing CV: chest pain or dyspnea on exertion GI: as per HPI GU: dysuria, trouble voiding, or hematuria MSK: joint pain or joint stiffness Neuro: TIA or stroke symptoms Derm: pruritus and skin lesion changes Psych: anxiety and depression  Objective   PE Blood pressure 134/89, pulse 78, temperature 98.7 F (37.1 C), temperature source Temporal, resp. rate  10, height 4' 11 (1.499 m), weight 100.2 kg, SpO2 97%. Constitutional: NAD; conversant; no deformities Eyes: Moist conjunctiva; no lid lag; anicteric; PERRL Neck: Trachea midline; no thyromegaly Lungs: Normal respiratory effort; no tactile fremitus CV: RRR; no palpable thrills; no pitting edema GI: Abd Soft, nontender; no palpable hepatosplenomegaly MSK: Normal range of motion of extremities; no clubbing/cyanosis Psychiatric: Appropriate affect; alert and oriented x3 Lymphatic: No palpable cervical or axillary lymphadenopathy  No results found for this or any previous visit (from the past 24 hours).  Imaging Orders  No imaging studies ordered today     Assessment and Plan   Denise Walker is an 34 y.o. female with obesity, here for EGD prior to bariatric surgery.  She is interested in a robotic single anastomosis duodenoileostomy with sleeve gastrectomy (SADI-S), cholecystectomy and upper endoscopy.  Upper endoscopy with biopsy. I explained my rational for performing upper endoscopy in my bariatric patients. During the procedure I will biopsy for H. Pylori, evaluate for hiatal hernia, evaluate for reflux esophagitis and look for any other abnormalities that may influence the procedure. We discussed the risks, benefits and alternative to this procedure and the patient granted consent to proceed.   Deward JINNY Foy, MD  [facility] Use [email or web address] to contact on call provider       [1]  Allergies Allergen Reactions   Fish Allergy Shortness Of Breath   Shellfish Allergy Shortness Of Breath   Rizatriptan Other (See Comments)    Head pressure, neck locking up  rizatriptan   Trazodone And Nefazodone Other (See Comments)    Headache   "

## 2024-02-19 NOTE — Anesthesia Preprocedure Evaluation (Addendum)
"                                    Anesthesia Evaluation  Patient identified by MRN, date of birth, ID band  Reviewed: Allergy & Precautions, NPO status , Patient's Chart, lab work & pertinent test results  History of Anesthesia Complications Negative for: history of anesthetic complications  Airway Mallampati: II  TM Distance: >3 FB Neck ROM: Full    Dental  (+) Teeth Intact, Dental Advisory Given   Pulmonary neg pulmonary ROS   breath sounds clear to auscultation       Cardiovascular  Rhythm:Regular     Neuro/Psych   Anxiety Depression       GI/Hepatic Neg liver ROS,GERD  Medicated and Controlled,,  Endo/Other  diabetes (Gestational)Hypothyroidism   PCOS    Renal/GU Renal disease     Musculoskeletal  (+) Arthritis , Osteoarthritis,   RLE Celluitis (01/16/24) s/p Abx with resolved symptoms   Abdominal   Peds  Hematology   Anesthesia Other Findings   Reproductive/Obstetrics                              Anesthesia Physical Anesthesia Plan  ASA: 3  Anesthesia Plan: MAC   Post-op Pain Management:    Induction: Intravenous  PONV Risk Score and Plan: 2 and Propofol  infusion and Treatment may vary due to age or medical condition  Airway Management Planned: Natural Airway and Simple Face Mask  Additional Equipment: None  Intra-op Plan:   Post-operative Plan: Extubation in OR  Informed Consent: I have reviewed the patients History and Physical, chart, labs and discussed the procedure including the risks, benefits and alternatives for the proposed anesthesia with the patient or authorized representative who has indicated his/her understanding and acceptance.     Dental advisory given  Plan Discussed with: CRNA  Anesthesia Plan Comments: (Patient refused UPT due to IUD and unable to pee. Discussed risks of anesthesia during early pregnancy. Patient understanding and willing to accept those risks. )          Anesthesia Quick Evaluation  "

## 2024-02-19 NOTE — Transfer of Care (Signed)
 Immediate Anesthesia Transfer of Care Note  Patient: Denise Walker  Procedure(s) Performed: EGD (ESOPHAGOGASTRODUODENOSCOPY) BIOPSY, SKIN, SUBCUTANEOUS TISSUE, OR MUCOUS MEMBRANE  Patient Location: PACU and Endoscopy Unit  Anesthesia Type:MAC  Level of Consciousness: awake, alert , and oriented  Airway & Oxygen Therapy: Patient Spontanous Breathing and Patient connected to face mask oxygen  Post-op Assessment: Report given to RN and Post -op Vital signs reviewed and stable  Post vital signs: Reviewed and stable  Last Vitals:  Vitals Value Taken Time  BP 113/55 02/19/24 07:55  Temp    Pulse 89 02/19/24 07:58  Resp 13 02/19/24 07:58  SpO2 98 % 02/19/24 07:58  Vitals shown include unfiled device data.  Last Pain:  Vitals:   02/19/24 0701  TempSrc: Temporal  PainSc: 0-No pain         Complications: No notable events documented.

## 2024-02-20 ENCOUNTER — Encounter (HOSPITAL_COMMUNITY): Payer: Self-pay | Admitting: Surgery

## 2024-02-20 LAB — SURGICAL PATHOLOGY

## 2024-02-22 NOTE — Anesthesia Postprocedure Evaluation (Signed)
"   Anesthesia Post Note  Patient: Denise Walker  Procedure(s) Performed: EGD (ESOPHAGOGASTRODUODENOSCOPY) BIOPSY, SKIN, SUBCUTANEOUS TISSUE, OR MUCOUS MEMBRANE     Patient location during evaluation: Endoscopy Anesthesia Type: MAC Level of consciousness: awake Pain management: pain level controlled Vital Signs Assessment: post-procedure vital signs reviewed and stable Respiratory status: spontaneous breathing Cardiovascular status: blood pressure returned to baseline Postop Assessment: no apparent nausea or vomiting Anesthetic complications: no   No notable events documented.  Last Vitals:  Vitals:   02/19/24 0810 02/19/24 0817  BP: 116/79 (!) 108/53  Pulse: 64 65  Resp: 14 18  Temp:    SpO2: 96% 95%    Last Pain:  Vitals:   02/19/24 0817  TempSrc:   PainSc: 0-No pain                 Keli Buehner T Colhoun      "

## 2024-02-23 ENCOUNTER — Other Ambulatory Visit: Payer: Self-pay | Admitting: Family Medicine

## 2024-02-23 DIAGNOSIS — E038 Other specified hypothyroidism: Secondary | ICD-10-CM

## 2024-02-24 NOTE — Telephone Encounter (Signed)
 Requested Prescriptions  Pending Prescriptions Disp Refills   levothyroxine  (SYNTHROID ) 75 MCG tablet [Pharmacy Med Name: LEVOTHYROXINE  75 MCG TABLET] 90 tablet 1    Sig: TAKE 1 TABLET BY MOUTH EVERY DAY     Endocrinology:  Hypothyroid Agents Passed - 02/24/2024  2:51 PM      Passed - TSH in normal range and within 360 days    TSH  Date Value Ref Range Status  10/06/2023 1.750 0.450 - 4.500 uIU/mL Final         Passed - Valid encounter within last 12 months    Recent Outpatient Visits           2 weeks ago Cellulitis of right lower extremity   [place] [facility], Selinda PARAS, MD   2 weeks ago Cellulitis of right lower extremity   [place] [facility], Harlene, MD   3 weeks ago Cellulitis of right lower extremity   [place] [facility], Selinda PARAS, MD   2 months ago Arthralgia of left knee   [facility], Selinda PARAS, MD   3 months ago COVID-19   [facility] at [facility], Selinda PARAS, MD       Future Appointments             In 7 months Ku, Selinda PARAS, MD [facility] at [facility], [phone] [place]

## 2024-02-26 ENCOUNTER — Encounter: Attending: Surgery | Admitting: Dietician

## 2024-02-26 ENCOUNTER — Encounter: Payer: Self-pay | Admitting: Dietician

## 2024-02-26 VITALS — Ht 59.0 in | Wt 224.7 lb

## 2024-02-26 DIAGNOSIS — R7303 Prediabetes: Secondary | ICD-10-CM

## 2024-02-26 DIAGNOSIS — Z6841 Body Mass Index (BMI) 40.0 and over, adult: Secondary | ICD-10-CM | POA: Insufficient documentation

## 2024-02-26 DIAGNOSIS — Z713 Dietary counseling and surveillance: Secondary | ICD-10-CM | POA: Insufficient documentation

## 2024-02-26 DIAGNOSIS — E282 Polycystic ovarian syndrome: Secondary | ICD-10-CM

## 2024-02-26 NOTE — Progress Notes (Signed)
 Supervised Weight Loss for Bariatric Surgery Appt start time: 1140 end time:  1215  [facility] Appointment 3 of 3   Anthropometrics Start Weight at [facility]: 222.6lbs  (12/29/23) Weight: 224.6lbs with shoes, sweater Height: 4'11  BMI: 45.38  Clinical Medications: levothyroxine , pantoprazole , lovonorgestrel IUD, mupironcin ointment, hydrOXYzine  prn, clobetasol ointment prn  Health/ medical history changes: no changes since previous visit 01/26/24.  Dietary/ Lifestyle Progress: Continues to work to manage with daughter's milk allergy, finding options that work for the family Consuming larger portions of veggies and less starch; including protein foods   Dietary intake: Eating Pattern: 3 meals and 1 snacks daily  Breakfast: egg sandwich or bowl + tomato  Snack: none  Lunch: leftovers/ sandwich with meat + fruit/ cabbage steak with cheese, sometimes bacon Snack: occasionally granola bar/ apple/ packaged crackers/ saltines with melted cheese Dinner: chicken/ lean meat no breading, baked/ sauteed/ grilled + veg Snack: none Beverages: water, some with sugar free flavoring  Usual physical activity: active play time with children, walking 60+ minutes, 4-6 times a week    Intervention:   Nutrition counseling for weight loss prior to upcoming bariatric surgery. Slight increase in weight since previous visit possibly due to holidays, recent poison oak infection Discussed suitable options for pre- and post-op protein drinks, protein goal, limiting sugar content.  Instructed on basic nutrition and meal options for pre-op liver-shrinking diet pattern, to start at least 2 weeks prior to surgery Reviewed importance of weight loss surgery as tool rather than complete solution for permanent weight loss; importance of positive support from others Patient voices good understanding of pre- and post-op diets. She is making positive and appropriate diet changes and is motivated to continue.  Patient has solid  support from family for her weight loss journey.  She is a good candidate for bariatric surgery, from a nutrition standpoint.               Nutritional Diagnosis:  [place]-3.3 Overweight/obesity related to history of excess calories and inadequate physical activity as evidenced by patient with current BMI of 45, following dietary guidelines for weight loss prior to bariatric surgery.               Teaching Method Utilized:  Visual Auditory Hands on  Materials provided: Pre-operative and Modified Full Liquid Diet Preparation  Learning Readiness:  Change in progress  Barriers to learning/adherence to lifestyle change: none  Demonstrated degree of understanding via:  Teach Back   Plan:  attend pre-op class at least 2 weeks prior to scheduled surgery.

## 2024-02-26 NOTE — Patient Instructions (Signed)
 Continue with low carb meals that contain lean protein and vegetables.  Great job including regular activity, keep it up!

## 2024-03-01 ENCOUNTER — Encounter: Admitting: Dietician

## 2024-10-06 ENCOUNTER — Encounter: Admitting: Family Medicine
